# Patient Record
Sex: Female | Born: 1935 | Race: Black or African American | Hispanic: No | Smoking: Former smoker
Health system: Southern US, Community
[De-identification: ages and names within clinical notes are randomized; demographics above are authoritative.]

## PROBLEM LIST (undated history)

## (undated) DIAGNOSIS — F0280 Dementia in other diseases classified elsewhere without behavioral disturbance: Secondary | ICD-10-CM

## (undated) DIAGNOSIS — I129 Hypertensive chronic kidney disease with stage 1 through stage 4 chronic kidney disease, or unspecified chronic kidney disease: Secondary | ICD-10-CM

## (undated) DIAGNOSIS — F329 Major depressive disorder, single episode, unspecified: Secondary | ICD-10-CM

## (undated) DIAGNOSIS — I2510 Atherosclerotic heart disease of native coronary artery without angina pectoris: Secondary | ICD-10-CM

## (undated) DIAGNOSIS — F32A Depression, unspecified: Secondary | ICD-10-CM

## (undated) DIAGNOSIS — I639 Cerebral infarction, unspecified: Secondary | ICD-10-CM

## (undated) DIAGNOSIS — R0609 Other forms of dyspnea: Secondary | ICD-10-CM

## (undated) DIAGNOSIS — I10 Essential (primary) hypertension: Secondary | ICD-10-CM

## (undated) DIAGNOSIS — G309 Alzheimer's disease, unspecified: Secondary | ICD-10-CM

## (undated) DIAGNOSIS — I509 Heart failure, unspecified: Secondary | ICD-10-CM

## (undated) DIAGNOSIS — J449 Chronic obstructive pulmonary disease, unspecified: Secondary | ICD-10-CM

## (undated) DIAGNOSIS — J9621 Acute and chronic respiratory failure with hypoxia: Secondary | ICD-10-CM

## (undated) DIAGNOSIS — R0600 Dyspnea, unspecified: Secondary | ICD-10-CM

## (undated) DIAGNOSIS — J209 Acute bronchitis, unspecified: Secondary | ICD-10-CM

## (undated) DIAGNOSIS — J329 Chronic sinusitis, unspecified: Secondary | ICD-10-CM

## (undated) DIAGNOSIS — E43 Unspecified severe protein-calorie malnutrition: Secondary | ICD-10-CM

## (undated) DIAGNOSIS — Z9981 Dependence on supplemental oxygen: Secondary | ICD-10-CM

## (undated) DIAGNOSIS — E7800 Pure hypercholesterolemia, unspecified: Secondary | ICD-10-CM

## (undated) DIAGNOSIS — R519 Headache, unspecified: Secondary | ICD-10-CM

## (undated) DIAGNOSIS — E119 Type 2 diabetes mellitus without complications: Secondary | ICD-10-CM

## (undated) DIAGNOSIS — E785 Hyperlipidemia, unspecified: Secondary | ICD-10-CM

## (undated) DIAGNOSIS — J441 Chronic obstructive pulmonary disease with (acute) exacerbation: Secondary | ICD-10-CM

## (undated) DIAGNOSIS — I519 Heart disease, unspecified: Secondary | ICD-10-CM

## (undated) DIAGNOSIS — E1169 Type 2 diabetes mellitus with other specified complication: Secondary | ICD-10-CM

## (undated) DIAGNOSIS — R51 Headache: Secondary | ICD-10-CM

## (undated) HISTORY — DX: Acute bronchitis, unspecified: J20.9

## (undated) HISTORY — DX: Chronic obstructive pulmonary disease with (acute) exacerbation: J44.1

## (undated) HISTORY — PX: OTHER SURGICAL HISTORY: SHX169

## (undated) HISTORY — DX: Heart disease, unspecified: I51.9

## (undated) HISTORY — DX: Dyspnea, unspecified: R06.00

## (undated) HISTORY — DX: Hyperlipidemia, unspecified: E11.69

## (undated) HISTORY — DX: Heart failure, unspecified: I50.9

## (undated) HISTORY — DX: Hyperlipidemia, unspecified: E78.5

## (undated) HISTORY — DX: Essential (primary) hypertension: I10

## (undated) HISTORY — DX: Alzheimer's disease, unspecified: G30.9

## (undated) HISTORY — DX: Type 2 diabetes mellitus without complications: E11.9

## (undated) HISTORY — PX: ESOPHAGOGASTRODUODENOSCOPY: SHX1529

## (undated) HISTORY — PX: TONSILLECTOMY: SUR1361

## (undated) HISTORY — DX: Pure hypercholesterolemia, unspecified: E78.00

## (undated) HISTORY — DX: Unspecified severe protein-calorie malnutrition: E43

## (undated) HISTORY — DX: Other forms of dyspnea: R06.09

## (undated) HISTORY — DX: Dementia in other diseases classified elsewhere without behavioral disturbance: F02.80

## (undated) HISTORY — DX: Atherosclerotic heart disease of native coronary artery without angina pectoris: I25.10

## (undated) HISTORY — DX: Hypertensive chronic kidney disease with stage 1 through stage 4 chronic kidney disease, or unspecified chronic kidney disease: I12.9

## (undated) HISTORY — DX: Acute and chronic respiratory failure with hypoxia: J96.21

## (undated) HISTORY — PX: NASAL SINUS SURGERY: SHX719

---

## 1995-04-26 DIAGNOSIS — I639 Cerebral infarction, unspecified: Secondary | ICD-10-CM

## 1995-04-26 HISTORY — DX: Cerebral infarction, unspecified: I63.9

## 1997-09-01 ENCOUNTER — Other Ambulatory Visit: Admission: RE | Admit: 1997-09-01 | Discharge: 1997-09-01 | Payer: Self-pay | Admitting: *Deleted

## 1997-09-26 ENCOUNTER — Other Ambulatory Visit: Admission: RE | Admit: 1997-09-26 | Discharge: 1997-09-26 | Payer: Self-pay | Admitting: Internal Medicine

## 1997-10-01 ENCOUNTER — Ambulatory Visit: Admission: RE | Admit: 1997-10-01 | Discharge: 1997-10-01 | Payer: Self-pay | Admitting: *Deleted

## 1997-10-23 ENCOUNTER — Other Ambulatory Visit: Admission: RE | Admit: 1997-10-23 | Discharge: 1997-10-23 | Payer: Self-pay | Admitting: *Deleted

## 1997-10-31 ENCOUNTER — Other Ambulatory Visit: Admission: RE | Admit: 1997-10-31 | Discharge: 1997-10-31 | Payer: Self-pay | Admitting: Internal Medicine

## 1997-11-11 ENCOUNTER — Other Ambulatory Visit: Admission: RE | Admit: 1997-11-11 | Discharge: 1997-11-11 | Payer: Self-pay | Admitting: *Deleted

## 1998-04-15 ENCOUNTER — Ambulatory Visit: Admission: RE | Admit: 1998-04-15 | Discharge: 1998-04-15 | Payer: Self-pay | Admitting: *Deleted

## 1998-11-02 ENCOUNTER — Encounter: Admission: RE | Admit: 1998-11-02 | Discharge: 1998-11-23 | Payer: Self-pay | Admitting: *Deleted

## 1999-05-13 ENCOUNTER — Encounter: Payer: Self-pay | Admitting: *Deleted

## 1999-05-13 ENCOUNTER — Encounter: Admission: RE | Admit: 1999-05-13 | Discharge: 1999-05-13 | Payer: Self-pay | Admitting: *Deleted

## 1999-09-14 ENCOUNTER — Encounter: Admission: RE | Admit: 1999-09-14 | Discharge: 1999-09-14 | Payer: Self-pay | Admitting: Internal Medicine

## 1999-09-28 ENCOUNTER — Encounter: Admission: RE | Admit: 1999-09-28 | Discharge: 1999-09-28 | Payer: Self-pay | Admitting: Internal Medicine

## 1999-10-14 ENCOUNTER — Inpatient Hospital Stay (HOSPITAL_COMMUNITY): Admission: EM | Admit: 1999-10-14 | Discharge: 1999-10-19 | Payer: Self-pay | Admitting: Emergency Medicine

## 1999-10-14 ENCOUNTER — Encounter: Payer: Self-pay | Admitting: *Deleted

## 2000-02-08 ENCOUNTER — Other Ambulatory Visit: Admission: RE | Admit: 2000-02-08 | Discharge: 2000-02-08 | Payer: Self-pay | Admitting: *Deleted

## 2000-03-08 ENCOUNTER — Encounter (INDEPENDENT_AMBULATORY_CARE_PROVIDER_SITE_OTHER): Payer: Self-pay

## 2000-03-08 ENCOUNTER — Other Ambulatory Visit: Admission: RE | Admit: 2000-03-08 | Discharge: 2000-03-08 | Payer: Self-pay | Admitting: *Deleted

## 2000-05-31 ENCOUNTER — Encounter: Admission: RE | Admit: 2000-05-31 | Discharge: 2000-05-31 | Payer: Self-pay | Admitting: *Deleted

## 2000-05-31 ENCOUNTER — Encounter: Payer: Self-pay | Admitting: *Deleted

## 2001-01-30 ENCOUNTER — Other Ambulatory Visit: Admission: RE | Admit: 2001-01-30 | Discharge: 2001-01-30 | Payer: Self-pay | Admitting: *Deleted

## 2001-06-26 ENCOUNTER — Encounter: Admission: RE | Admit: 2001-06-26 | Discharge: 2001-06-26 | Payer: Self-pay | Admitting: Internal Medicine

## 2001-06-26 ENCOUNTER — Encounter: Payer: Self-pay | Admitting: Internal Medicine

## 2001-07-31 ENCOUNTER — Other Ambulatory Visit: Admission: RE | Admit: 2001-07-31 | Discharge: 2001-07-31 | Payer: Self-pay | Admitting: *Deleted

## 2002-04-10 ENCOUNTER — Encounter: Admission: RE | Admit: 2002-04-10 | Discharge: 2002-04-10 | Payer: Self-pay | Admitting: Internal Medicine

## 2002-04-10 ENCOUNTER — Encounter: Payer: Self-pay | Admitting: Internal Medicine

## 2002-05-10 ENCOUNTER — Encounter: Payer: Self-pay | Admitting: Internal Medicine

## 2002-05-10 ENCOUNTER — Encounter: Admission: RE | Admit: 2002-05-10 | Discharge: 2002-05-10 | Payer: Self-pay | Admitting: Internal Medicine

## 2002-07-18 ENCOUNTER — Encounter: Admission: RE | Admit: 2002-07-18 | Discharge: 2002-07-18 | Payer: Self-pay | Admitting: Internal Medicine

## 2002-07-18 ENCOUNTER — Encounter: Payer: Self-pay | Admitting: Internal Medicine

## 2003-01-07 ENCOUNTER — Emergency Department (HOSPITAL_COMMUNITY): Admission: EM | Admit: 2003-01-07 | Discharge: 2003-01-07 | Payer: Self-pay | Admitting: Emergency Medicine

## 2003-09-18 ENCOUNTER — Other Ambulatory Visit: Admission: RE | Admit: 2003-09-18 | Discharge: 2003-09-18 | Payer: Self-pay | Admitting: *Deleted

## 2003-09-18 ENCOUNTER — Ambulatory Visit (HOSPITAL_COMMUNITY): Admission: RE | Admit: 2003-09-18 | Discharge: 2003-09-18 | Payer: Self-pay | Admitting: Internal Medicine

## 2004-03-12 ENCOUNTER — Ambulatory Visit: Payer: Self-pay | Admitting: Cardiovascular Disease

## 2004-05-19 ENCOUNTER — Ambulatory Visit: Payer: Self-pay

## 2004-05-19 ENCOUNTER — Ambulatory Visit: Payer: Self-pay | Admitting: Cardiovascular Disease

## 2004-05-21 ENCOUNTER — Ambulatory Visit: Payer: Self-pay | Admitting: Cardiovascular Disease

## 2004-05-24 ENCOUNTER — Encounter: Admission: RE | Admit: 2004-05-24 | Discharge: 2004-05-24 | Payer: Self-pay | Admitting: Cardiovascular Disease

## 2004-07-09 ENCOUNTER — Ambulatory Visit: Admission: RE | Admit: 2004-07-09 | Discharge: 2004-07-09 | Payer: Self-pay | Admitting: Vascular Surgery

## 2004-07-20 ENCOUNTER — Ambulatory Visit (HOSPITAL_COMMUNITY): Admission: RE | Admit: 2004-07-20 | Discharge: 2004-07-20 | Payer: Self-pay | Admitting: Vascular Surgery

## 2004-09-28 ENCOUNTER — Ambulatory Visit (HOSPITAL_COMMUNITY): Admission: RE | Admit: 2004-09-28 | Discharge: 2004-09-28 | Payer: Self-pay | Admitting: Internal Medicine

## 2005-03-25 ENCOUNTER — Ambulatory Visit: Payer: Self-pay | Admitting: Cardiovascular Disease

## 2005-09-23 ENCOUNTER — Ambulatory Visit: Payer: Self-pay | Admitting: Cardiovascular Disease

## 2005-09-29 ENCOUNTER — Ambulatory Visit (HOSPITAL_COMMUNITY): Admission: RE | Admit: 2005-09-29 | Discharge: 2005-09-29 | Payer: Self-pay | Admitting: Internal Medicine

## 2006-04-04 ENCOUNTER — Ambulatory Visit: Payer: Self-pay | Admitting: Cardiovascular Disease

## 2006-10-03 ENCOUNTER — Ambulatory Visit: Payer: Self-pay | Admitting: Cardiovascular Disease

## 2006-10-05 ENCOUNTER — Ambulatory Visit: Payer: Self-pay | Admitting: Cardiovascular Disease

## 2006-10-05 ENCOUNTER — Ambulatory Visit (HOSPITAL_COMMUNITY): Admission: RE | Admit: 2006-10-05 | Discharge: 2006-10-05 | Payer: Self-pay | Admitting: Cardiovascular Disease

## 2006-10-10 ENCOUNTER — Ambulatory Visit (HOSPITAL_COMMUNITY): Admission: RE | Admit: 2006-10-10 | Discharge: 2006-10-10 | Payer: Self-pay | Admitting: Internal Medicine

## 2007-03-26 ENCOUNTER — Ambulatory Visit: Payer: Self-pay | Admitting: Cardiovascular Disease

## 2007-05-23 ENCOUNTER — Ambulatory Visit: Payer: Self-pay | Admitting: Vascular Surgery

## 2007-09-24 ENCOUNTER — Ambulatory Visit: Payer: Self-pay | Admitting: Cardiovascular Disease

## 2007-09-24 LAB — CONVERTED CEMR LAB: Rhuematoid fact SerPl-aCnc: <20 intl units/mL — ABNORMAL LOW (ref 0.0–20.0)

## 2007-10-12 ENCOUNTER — Ambulatory Visit (HOSPITAL_COMMUNITY): Admission: RE | Admit: 2007-10-12 | Discharge: 2007-10-12 | Payer: Self-pay | Admitting: Internal Medicine

## 2008-04-15 ENCOUNTER — Ambulatory Visit: Payer: Self-pay | Admitting: Cardiovascular Disease

## 2008-04-15 LAB — CONVERTED CEMR LAB
CO2: 32 meq/L (ref 19–32)
Chloride: 102 meq/L (ref 96–112)
Creatinine, Ser: 0.9 mg/dL (ref 0.4–1.2)

## 2008-05-07 ENCOUNTER — Ambulatory Visit: Payer: Self-pay | Admitting: Vascular Surgery

## 2008-05-14 ENCOUNTER — Ambulatory Visit: Payer: Self-pay | Admitting: Cardiovascular Disease

## 2008-05-14 ENCOUNTER — Ambulatory Visit (HOSPITAL_COMMUNITY): Admission: RE | Admit: 2008-05-14 | Discharge: 2008-05-14 | Payer: Self-pay | Admitting: Cardiovascular Disease

## 2008-09-08 ENCOUNTER — Telehealth: Payer: Self-pay | Admitting: Cardiovascular Disease

## 2008-09-12 DIAGNOSIS — I10 Essential (primary) hypertension: Secondary | ICD-10-CM | POA: Insufficient documentation

## 2008-09-15 ENCOUNTER — Ambulatory Visit: Payer: Self-pay | Admitting: Cardiovascular Disease

## 2008-10-14 ENCOUNTER — Ambulatory Visit: Payer: Self-pay | Admitting: Cardiovascular Disease

## 2008-11-04 ENCOUNTER — Ambulatory Visit (HOSPITAL_COMMUNITY): Admission: RE | Admit: 2008-11-04 | Discharge: 2008-11-04 | Payer: Self-pay | Admitting: Internal Medicine

## 2009-03-25 ENCOUNTER — Ambulatory Visit: Payer: Self-pay | Admitting: Vascular Surgery

## 2009-03-31 ENCOUNTER — Ambulatory Visit: Payer: Self-pay | Admitting: Cardiovascular Disease

## 2009-04-23 ENCOUNTER — Encounter: Payer: Self-pay | Admitting: Cardiovascular Disease

## 2009-10-28 ENCOUNTER — Ambulatory Visit: Payer: Self-pay | Admitting: Vascular Surgery

## 2009-11-11 ENCOUNTER — Ambulatory Visit (HOSPITAL_COMMUNITY): Admission: RE | Admit: 2009-11-11 | Discharge: 2009-11-11 | Payer: Self-pay | Admitting: Internal Medicine

## 2009-11-18 ENCOUNTER — Ambulatory Visit: Payer: Self-pay | Admitting: Cardiovascular Disease

## 2010-04-30 ENCOUNTER — Inpatient Hospital Stay (HOSPITAL_COMMUNITY)
Admission: EM | Admit: 2010-04-30 | Discharge: 2010-05-03 | Payer: Self-pay | Source: Home / Self Care | Attending: Internal Medicine | Admitting: Internal Medicine

## 2010-05-01 ENCOUNTER — Encounter (INDEPENDENT_AMBULATORY_CARE_PROVIDER_SITE_OTHER): Payer: Self-pay | Admitting: Internal Medicine

## 2010-05-06 ENCOUNTER — Ambulatory Visit: Admit: 2010-05-06 | Payer: Self-pay | Admitting: Vascular Surgery

## 2010-05-10 LAB — DIFFERENTIAL
Basophils Absolute: 0.1 10*3/uL (ref 0.0–0.1)
Basophils Absolute: 0 10*3/uL (ref 0.0–0.1)
Basophils Relative: 1 % (ref 0–1)
Basophils Relative: 1 % (ref 0–1)
Eosinophils Absolute: 0.4 10*3/uL (ref 0.0–0.7)
Eosinophils Absolute: 0.3 10*3/uL (ref 0.0–0.7)
Eosinophils Relative: 4 % (ref 0–5)
Eosinophils Relative: 4 % (ref 0–5)
Lymphocytes Relative: 43 % (ref 12–46)
Lymphocytes Relative: 37 % (ref 12–46)
Lymphs Abs: 4.4 10*3/uL — ABNORMAL HIGH (ref 0.7–4.0)
Lymphs Abs: 2.9 10*3/uL (ref 0.7–4.0)
Monocytes Absolute: 0.9 10*3/uL (ref 0.1–1.0)
Monocytes Absolute: 0.6 10*3/uL (ref 0.1–1.0)
Monocytes Relative: 9 % (ref 3–12)
Monocytes Relative: 8 % (ref 3–12)
Neutro Abs: 4.5 10*3/uL (ref 1.7–7.7)
Neutro Abs: 3.9 10*3/uL (ref 1.7–7.7)
Neutrophils Relative %: 44 % (ref 43–77)
Neutrophils Relative %: 50 % (ref 43–77)

## 2010-05-10 LAB — HEMOGLOBIN A1C
Hgb A1c MFr Bld: 8.1 % — ABNORMAL HIGH (ref <5.7)
Mean Plasma Glucose: 186 mg/dL — ABNORMAL HIGH (ref <117)

## 2010-05-10 LAB — GLUCOSE, CAPILLARY
Glucose-Capillary: 162 mg/dL — ABNORMAL HIGH (ref 70–99)
Glucose-Capillary: 164 mg/dL — ABNORMAL HIGH (ref 70–99)
Glucose-Capillary: 165 mg/dL — ABNORMAL HIGH (ref 70–99)
Glucose-Capillary: 179 mg/dL — ABNORMAL HIGH (ref 70–99)
Glucose-Capillary: 189 mg/dL — ABNORMAL HIGH (ref 70–99)
Glucose-Capillary: 76 mg/dL (ref 70–99)
Glucose-Capillary: 134 mg/dL — ABNORMAL HIGH (ref 70–99)
Glucose-Capillary: 146 mg/dL — ABNORMAL HIGH (ref 70–99)
Glucose-Capillary: 166 mg/dL — ABNORMAL HIGH (ref 70–99)
Glucose-Capillary: 193 mg/dL — ABNORMAL HIGH (ref 70–99)
Glucose-Capillary: 236 mg/dL — ABNORMAL HIGH (ref 70–99)
Glucose-Capillary: 92 mg/dL (ref 70–99)
Glucose-Capillary: 119 mg/dL — ABNORMAL HIGH (ref 70–99)
Glucose-Capillary: 121 mg/dL — ABNORMAL HIGH (ref 70–99)
Glucose-Capillary: 127 mg/dL — ABNORMAL HIGH (ref 70–99)
Glucose-Capillary: 270 mg/dL — ABNORMAL HIGH (ref 70–99)

## 2010-05-10 LAB — CBC
HCT: 43.5 % (ref 36.0–46.0)
HCT: 41.5 % (ref 36.0–46.0)
HCT: 37.3 % (ref 36.0–46.0)
Hemoglobin: 14.3 g/dL (ref 12.0–15.0)
Hemoglobin: 13.3 g/dL (ref 12.0–15.0)
Hemoglobin: 12.1 g/dL (ref 12.0–15.0)
MCH: 31.6 pg (ref 26.0–34.0)
MCH: 31.3 pg (ref 26.0–34.0)
MCH: 31.8 pg (ref 26.0–34.0)
MCHC: 32.9 g/dL (ref 30.0–36.0)
MCHC: 32 g/dL (ref 30.0–36.0)
MCHC: 32.4 g/dL (ref 30.0–36.0)
MCV: 96.2 fL (ref 78.0–100.0)
MCV: 97.6 fL (ref 78.0–100.0)
MCV: 97.9 fL (ref 78.0–100.0)
Platelets: 171 10*3/uL (ref 150–400)
Platelets: 182 10*3/uL (ref 150–400)
Platelets: 152 10*3/uL (ref 150–400)
RBC: 4.52 MIL/uL (ref 3.87–5.11)
RBC: 4.25 MIL/uL (ref 3.87–5.11)
RBC: 3.81 MIL/uL — ABNORMAL LOW (ref 3.87–5.11)
RDW: 13.5 % (ref 11.5–15.5)
RDW: 13.7 % (ref 11.5–15.5)
RDW: 13.6 % (ref 11.5–15.5)
WBC: 10.2 10*3/uL (ref 4.0–10.5)
WBC: 7.8 10*3/uL (ref 4.0–10.5)
WBC: 7.7 10*3/uL (ref 4.0–10.5)

## 2010-05-10 LAB — COMPREHENSIVE METABOLIC PANEL
ALT: 19 U/L (ref 0–35)
AST: 20 U/L (ref 0–37)
Albumin: 3.8 g/dL (ref 3.5–5.2)
Alkaline Phosphatase: 42 U/L (ref 39–117)
BUN: 12 mg/dL (ref 6–23)
CO2: 27 mEq/L (ref 19–32)
Calcium: 8.5 mg/dL (ref 8.4–10.5)
Chloride: 102 mEq/L (ref 96–112)
Creatinine, Ser: 0.88 mg/dL (ref 0.4–1.2)
GFR calc Af Amer: >60 mL/min (ref >60)
GFR calc non Af Amer: >60 mL/min (ref >60)
Glucose, Bld: 56 mg/dL — ABNORMAL LOW (ref 70–99)
Potassium: 3.8 mEq/L (ref 3.5–5.1)
Sodium: 136 mEq/L (ref 135–145)
Total Bilirubin: 0.5 mg/dL (ref 0.3–1.2)
Total Protein: 6.8 g/dL (ref 6.0–8.3)

## 2010-05-10 LAB — POCT CARDIAC MARKERS
CKMB, poc: 2.2 ng/mL (ref 1.0–8.0)
Myoglobin, poc: 51.9 ng/mL (ref 12–200)
Troponin i, poc: <0.05 ng/mL (ref 0.00–0.09)

## 2010-05-10 LAB — BASIC METABOLIC PANEL
BUN: 10 mg/dL (ref 6–23)
BUN: 11 mg/dL (ref 6–23)
BUN: 15 mg/dL (ref 6–23)
CO2: 28 mEq/L (ref 19–32)
CO2: 28 mEq/L (ref 19–32)
CO2: 30 mEq/L (ref 19–32)
Calcium: 9.4 mg/dL (ref 8.4–10.5)
Calcium: 8.6 mg/dL (ref 8.4–10.5)
Calcium: 8.9 mg/dL (ref 8.4–10.5)
Chloride: 101 mEq/L (ref 96–112)
Chloride: 104 mEq/L (ref 96–112)
Chloride: 103 mEq/L (ref 96–112)
Creatinine, Ser: 0.95 mg/dL (ref 0.4–1.2)
Creatinine, Ser: 0.8 mg/dL (ref 0.4–1.2)
Creatinine, Ser: 0.74 mg/dL (ref 0.4–1.2)
GFR calc Af Amer: >60 mL/min (ref >60)
GFR calc Af Amer: >60 mL/min (ref >60)
GFR calc Af Amer: >60 mL/min (ref >60)
GFR calc non Af Amer: 57 mL/min — ABNORMAL LOW (ref >60)
GFR calc non Af Amer: >60 mL/min (ref >60)
GFR calc non Af Amer: >60 mL/min (ref >60)
Glucose, Bld: 118 mg/dL — ABNORMAL HIGH (ref 70–99)
Glucose, Bld: 97 mg/dL (ref 70–99)
Glucose, Bld: 120 mg/dL — ABNORMAL HIGH (ref 70–99)
Potassium: 4 mEq/L (ref 3.5–5.1)
Potassium: 3.7 mEq/L (ref 3.5–5.1)
Potassium: 4.1 mEq/L (ref 3.5–5.1)
Sodium: 138 mEq/L (ref 135–145)
Sodium: 138 mEq/L (ref 135–145)
Sodium: 138 mEq/L (ref 135–145)

## 2010-05-10 LAB — PROTIME-INR
INR: 2.67 — ABNORMAL HIGH (ref 0.00–1.49)
INR: 2.52 — ABNORMAL HIGH (ref 0.00–1.49)
INR: 3.18 — ABNORMAL HIGH (ref 0.00–1.49)
INR: 2.49 — ABNORMAL HIGH (ref 0.00–1.49)
Prothrombin Time: 28.5 seconds — ABNORMAL HIGH (ref 11.6–15.2)
Prothrombin Time: 27.3 seconds — ABNORMAL HIGH (ref 11.6–15.2)
Prothrombin Time: 32.6 seconds — ABNORMAL HIGH (ref 11.6–15.2)
Prothrombin Time: 27 seconds — ABNORMAL HIGH (ref 11.6–15.2)

## 2010-05-10 LAB — TSH: TSH: 0.849 u[IU]/mL (ref 0.350–4.500)

## 2010-05-10 LAB — LIPID PANEL
Cholesterol: 171 mg/dL (ref 0–200)
HDL: 53 mg/dL (ref >39)
LDL Cholesterol: 73 mg/dL (ref 0–99)
Total CHOL/HDL Ratio: 3.2 RATIO
Triglycerides: 226 mg/dL — ABNORMAL HIGH (ref <150)
VLDL: 45 mg/dL — ABNORMAL HIGH (ref 0–40)

## 2010-05-10 LAB — URINALYSIS, ROUTINE W REFLEX MICROSCOPIC
Bilirubin Urine: NEGATIVE
Hgb urine dipstick: NEGATIVE
Ketones, ur: NEGATIVE mg/dL
Nitrite: POSITIVE — AB
Protein, ur: NEGATIVE mg/dL
Specific Gravity, Urine: 1.012 (ref 1.005–1.030)
Urine Glucose, Fasting: NEGATIVE mg/dL
Urobilinogen, UA: 1 mg/dL (ref 0.0–1.0)
pH: 6.5 (ref 5.0–8.0)

## 2010-05-10 LAB — CK TOTAL AND CKMB (NOT AT ARMC)
CK, MB: 3.4 ng/mL (ref 0.3–4.0)
Relative Index: 2.2 (ref 0.0–2.5)
Total CK: 157 U/L (ref 7–177)

## 2010-05-10 LAB — CARDIAC PANEL(CRET KIN+CKTOT+MB+TROPI)
CK, MB: 1.9 ng/mL (ref 0.3–4.0)
CK, MB: 2.4 ng/mL (ref 0.3–4.0)
Relative Index: 1.6 (ref 0.0–2.5)
Relative Index: 1.8 (ref 0.0–2.5)
Total CK: 117 U/L (ref 7–177)
Total CK: 130 U/L (ref 7–177)
Troponin I: 0.02 ng/mL (ref 0.00–0.06)
Troponin I: 0.01 ng/mL (ref 0.00–0.06)

## 2010-05-10 LAB — PHOSPHORUS: Phosphorus: 3.9 mg/dL (ref 2.3–4.6)

## 2010-05-10 LAB — URINE MICROSCOPIC-ADD ON

## 2010-05-10 LAB — URINE CULTURE
Colony Count: >=100000
Culture  Setup Time: 201201071723

## 2010-05-10 LAB — BRAIN NATRIURETIC PEPTIDE: Pro B Natriuretic peptide (BNP): 49 pg/mL (ref 0.0–100.0)

## 2010-05-10 LAB — DIGOXIN LEVEL: Digoxin Level: 0.7 ng/mL — ABNORMAL LOW (ref 0.8–2.0)

## 2010-05-10 LAB — TROPONIN I: Troponin I: 0.03 ng/mL (ref 0.00–0.06)

## 2010-05-10 LAB — T4, FREE: Free T4: 1.17 ng/dL (ref 0.80–1.80)

## 2010-05-10 LAB — APTT: aPTT: 48 seconds — ABNORMAL HIGH (ref 24–37)

## 2010-05-10 LAB — MAGNESIUM: Magnesium: 2.2 mg/dL (ref 1.5–2.5)

## 2010-05-13 ENCOUNTER — Ambulatory Visit
Admission: RE | Admit: 2010-05-13 | Discharge: 2010-05-13 | Payer: Self-pay | Source: Home / Self Care | Attending: Cardiovascular Disease | Admitting: Cardiovascular Disease

## 2010-05-13 ENCOUNTER — Other Ambulatory Visit: Payer: Self-pay | Admitting: Cardiovascular Disease

## 2010-05-13 LAB — URINALYSIS
Bilirubin Urine: NEGATIVE
Hemoglobin, Urine: NEGATIVE
Ketones, ur: NEGATIVE
Leukocytes, UA: NEGATIVE
Nitrite: NEGATIVE
Specific Gravity, Urine: 1.02 (ref 1.000–1.030)
Total Protein, Urine: NEGATIVE
Urine Glucose: NEGATIVE
Urobilinogen, UA: 0.2 (ref 0.0–1.0)
pH: 5.5 (ref 5.0–8.0)

## 2010-05-15 ENCOUNTER — Encounter: Payer: Self-pay | Admitting: Internal Medicine

## 2010-05-16 ENCOUNTER — Encounter: Payer: Self-pay | Admitting: Internal Medicine

## 2010-05-25 NOTE — Assessment & Plan Note (Signed)
Summary: F6M/DM   Primary Provider:  Dr Velna Hatchet   History of Present Illness: [name] is seen today for F/U of PAF, right carotid bruit, HTN and elevated lipids.  She is doing well except for her sinuses.  She has had two previous surgeries and continues to complain about chronic infectiosn.  She has just seen Dr. Selmer Dominion who is considering doing surgery.  From a cardiac standpoint that would be fine.  She could hold her coumadin for 4-5 days without lovenox overlap.  She has a history of DCM with no CAD EF 45% by MRI in 04/2008.  She denies SSCP, palpitations or dyspnea.  She has had no edema and continues to be very thin.  she has been compliant with her meds. Her INR's have been Rx with no bleeding diathesis  She just had a duplex at Dr fields office and her RICA is stable with 60-79% stenosis.    Current Problems (verified): 1)  Palpitations, Occasional  (ICD-785.1) 2)  Carotid Artery Disease  (ICD-433.10) 3)  Congestive Heart Failure, Hx of  (ICD-V12.50) 4)  Cardiomyopathy  (ICD-425.4) 5)  Hypertension  (ICD-401.9) 6)  Hypercholesterolemia  (ICD-272.0) 7)  Restless Leg Syndrome, Hx of  (ICD-V12.49) 8)  Dyspnea On Exertion  (ICD-786.09) 9)  Diabetes Mellitus, Type II  (ICD-250.00)  Current Medications (verified): 1)  Digoxin 0.125 Mg Tabs (Digoxin) .... Take One Tablet By Mouth Daily 2)  Coreg 3.125 Mg Tabs (Carvedilol) .Marland Kitchen.. 1 Tab Two Times A Day 3)  Furosemide 20 Mg Tabs (Furosemide) .... Take One Tablet By Mouth Daily. 4)  Benazepril Hcl 40 Mg Tabs (Benazepril Hcl) .Marland Kitchen.. 1 Tab By Mouth Once Daily 5)  Glimepiride 2 Mg Tabs (Glimepiride) .Marland Kitchen.. 1 Tab By Mouth Once Daily 6)  Coumadin 4 Mg Tabs (Warfarin Sodium) .... As Directed 7)  Multivitamins   Tabs (Multiple Vitamin) .Marland Kitchen.. 1 Tab By Mouth Once Daily 8)  Lipitor 40 Mg Tabs (Atorvastatin Calcium) .Marland Kitchen.. 1 Tab Mon, Wed. Fri  Allergies (verified): No Known Drug Allergies  Past History:  Past Medical History: Last updated:  09/12/2008 CARDIOMYOPATHY (ICD-425.4)- Nonischemic 04/2008 EF 45% cardiac MRI no scar HYPERTENSION (ICD-401.9) HYPERCHOLESTEROLEMIA (ICD-272.0) CAROTID ARTERY DISEASE (ICD-433.10) CONGESTIVE HEART FAILURE, HX OF (ICD-V12.50) Atrial Fib RESTLESS LEG SYNDROME, HX OF (ICD-V12.49) DYSPNEA ON EXERTION (ICD-786.09)- Chronic DIABETES MELLITUS, TYPE II (ICD-250.00)  Past Surgical History: Last updated: 09/12/2008  Carotid angiogram- 2006  Hysterectomy- partial Gallbladder Esophagogastroduodenoscopy with foreign body removal.  sinus surgery  Family History: Last updated: 09/12/2008  Coronary artery disease.  Unknown neck cancer in her mother.  Social History: Last updated: 09/15/2008 Married - 1 child No alcohol use  No drug use Tobacco Use - Yes.   Review of Systems       Denies fever, malais, weight loss, blurry vision, decreased visual acuity, cough, sputum, SOB, hemoptysis, pleuritic pain, palpitaitons, heartburn, abdominal pain, melena, lower extremity edema, claudication, or rash.   Vital Signs:  Patient profile:   77 year old female Height:      64 inches Weight:      92 pounds BMI:     15.85 Pulse rate:   84 / minute Resp:     12 per minute BP sitting:   134 / 85  (left arm)  Vitals Entered By: Kem Parkinson (November 18, 2009 2:59 PM)  Physical Exam  General:  Affect appropriate Healthy:  appears stated age HEENT: normal Neck supple with no adenopathy JVP normal right  bruits no thyromegaly Lungs clear with  no wheezing and good diaphragmatic motion Heart:  S1/S2 no murmur,rub, gallop or click PMI normal Abdomen: benighn, BS positve, no tenderness, no AAA no bruit.  No HSM or HJR Distal pulses intact with no bruits No edema Neuro non-focal Skin warm and dry    Impression & Recommendations:  Problem # 1:  CAROTID ARTERY DISEASE (ICD-433.10) Stable 60-79% RICA stenosis.  F/U duplex CVTS next June Her updated medication list for this problem includes:     Coumadin 4 Mg Tabs (Warfarin sodium) .Marland Kitchen... As directed  Problem # 2:  CARDIOMYOPATHY (ICD-425.4) Stable euvolemic.  EF 45% by MRI 2010 Her updated medication list for this problem includes:    Digoxin 0.125 Mg Tabs (Digoxin) .Marland Kitchen... Take one tablet by mouth daily    Coreg 3.125 Mg Tabs (Carvedilol) .Marland Kitchen... 1 tab two times a day    Furosemide 20 Mg Tabs (Furosemide) .Marland Kitchen... Take one tablet by mouth daily.    Benazepril Hcl 40 Mg Tabs (Benazepril hcl) .Marland Kitchen... 1 tab by mouth once daily    Coumadin 4 Mg Tabs (Warfarin sodium) .Marland Kitchen... As directed  Problem # 3:  HYPERTENSION (ICD-401.9)  Her updated medication list for this problem includes:    Coreg 3.125 Mg Tabs (Carvedilol) .Marland Kitchen... 1 tab two times a day    Furosemide 20 Mg Tabs (Furosemide) .Marland Kitchen... Take one tablet by mouth daily.    Benazepril Hcl 40 Mg Tabs (Benazepril hcl) .Marland Kitchen... 1 tab by mouth once daily  Problem # 4:  HYPERTENSION (ICD-401.9) Well contorlled Her updated medication list for this problem includes:    Coreg 3.125 Mg Tabs (Carvedilol) .Marland Kitchen... 1 tab two times a day    Furosemide 20 Mg Tabs (Furosemide) .Marland Kitchen... Take one tablet by mouth daily.    Benazepril Hcl 40 Mg Tabs (Benazepril hcl) .Marland Kitchen... 1 tab by mouth once daily  Problem # 5:  PAROXYSMAL ATRIAL FIBRILLATION (ICD-427.31)  In NSR continue coumadin  Her updated medication list for this problem includes:    Digoxin 0.125 Mg Tabs (Digoxin) .Marland Kitchen... Take one tablet by mouth daily    Coreg 3.125 Mg Tabs (Carvedilol) .Marland Kitchen... 1 tab two times a day    Coumadin 4 Mg Tabs (Warfarin sodium) .Marland Kitchen... As directed  Orders: EKG w/ Interpretation (93000)  Patient Instructions: 1)  Your physician wants you to follow-up in:6 months   You will receive a reminder letter in the mail two months in advance. If you don't receive a letter, please call our office to schedule the follow-up appointment.   EKG Report  Procedure date:  11/18/2009  Findings:      NSR 84 Nonspecific ST/T wave changes Abnormal  ECG

## 2010-05-27 NOTE — Assessment & Plan Note (Signed)
Summary: 6 month rov/sl   Primary Provider:  Dr Velna Hatchet   History of Present Illness: [name] was recently hospitalzied for UTI, COPD exacerbation and diatolic CHF.  BP seems suboptimaly controlled despite 3 drug Rx.  Compliant with meds.  Still with frequency and dysuria and F/U UA off cipro not done.  Denies SSCP palpitations or edema.  Wheezing and SOB improved.  No cough sputum or fever.  Counseled for less than 10 minutes on smoking cessation.  She seems redicent to try despite recent hospitalization  Current Problems (verified): 1)  Dysuria  (ICD-788.1) 2)  Paroxysmal Atrial Fibrillation  (ICD-427.31) 3)  Palpitations, Occasional  (ICD-785.1) 4)  Carotid Artery Disease  (ICD-433.10) 5)  Congestive Heart Failure, Hx of  (ICD-V12.50) 6)  Cardiomyopathy  (ICD-425.4) 7)  Hypertension  (ICD-401.9) 8)  Hypercholesterolemia  (ICD-272.0) 9)  Restless Leg Syndrome, Hx of  (ICD-V12.49) 10)  Dyspnea On Exertion  (ICD-786.09) 11)  Diabetes Mellitus, Type II  (ICD-250.00)  Current Medications (verified): 1)  Digoxin 0.125 Mg Tabs (Digoxin) .... Take One Tablet By Mouth Daily 2)  Coreg 3.125 Mg Tabs (Carvedilol) .Marland Kitchen.. 1 Tab Two Times A Day 3)  Furosemide 20 Mg Tabs (Furosemide) .... Take One Tablet By Mouth Daily. 4)  Benazepril Hcl 40 Mg Tabs (Benazepril Hcl) .Marland Kitchen.. 1 Tab By Mouth Once Daily 5)  Glimepiride 2 Mg Tabs (Glimepiride) .Marland Kitchen.. 1 Tab By Mouth Once Daily 6)  Coumadin 4 Mg Tabs (Warfarin Sodium) .... As Directed 7)  Multivitamins   Tabs (Multiple Vitamin) .Marland Kitchen.. 1 Tab By Mouth Once Daily 8)  Lipitor 40 Mg Tabs (Atorvastatin Calcium) .Marland Kitchen.. 1 Tab Mon, Wed. Fri 9)  Amlodipine Besylate 5 Mg Tabs (Amlodipine Besylate) .... Take One Tablet By Mouth Daily  Allergies: No Known Drug Allergies  Past History:  Past Medical History: Last updated: 09/12/2008 CARDIOMYOPATHY (ICD-425.4)- Nonischemic 04/2008 EF 45% cardiac MRI no scar HYPERTENSION (ICD-401.9) HYPERCHOLESTEROLEMIA  (ICD-272.0) CAROTID ARTERY DISEASE (ICD-433.10) CONGESTIVE HEART FAILURE, HX OF (ICD-V12.50) Atrial Fib RESTLESS LEG SYNDROME, HX OF (ICD-V12.49) DYSPNEA ON EXERTION (ICD-786.09)- Chronic DIABETES MELLITUS, TYPE II (ICD-250.00)  Past Surgical History: Last updated: 09/12/2008  Carotid angiogram- 2006  Hysterectomy- partial Gallbladder Esophagogastroduodenoscopy with foreign body removal.  sinus surgery  Family History: Last updated: 09/12/2008  Coronary artery disease.  Unknown neck cancer in her mother.  Social History: Last updated: 09/15/2008 Married - 1 child No alcohol use  No drug use Tobacco Use - Yes.   Review of Systems       Denies fever, malais, weight loss, blurry vision, decreased visual acuity, cough, sputum,  hemoptysis, pleuritic pain, palpitaitons, heartburn, abdominal pain, melena, lower extremity edema, claudication, or rash.   Vital Signs:  Patient profile:   77 year old female Height:      64 inches Weight:      93 pounds BMI:     16.02 Pulse rate:   80 / minute BP sitting:   134 / 85  (left arm)  Vitals Entered By: Kem Parkinson (May 13, 2010 12:15 PM)  Physical Exam  General:  Affect appropriate Healthy:  appears stated age HEENT: normal Neck supple with no adenopathy JVP normal no bruits no thyromegaly Lungs clear with no wheezing and good diaphragmatic motion Heart:  S1/S2 no murmur,rub, gallop or click PMI normal Abdomen: benighn, BS positve, no tenderness, no AAA no bruit.  No HSM or HJR Distal pulses intact with no bruits No edema Neuro non-focal Skin warm and dry    Impression &  Recommendations:  Problem # 1:  DYSURIA (ICD-788.1) F/U UA given recent Rx of UTI Orders: TLB-Udip ONLY (81003-UDIP)  Problem # 2:  PAROXYSMAL ATRIAL FIBRILLATION (ICD-427.31) Continue medication maint NSR Her updated medication list for this problem includes:    Digoxin 0.125 Mg Tabs (Digoxin) .Marland Kitchen... Take one tablet by mouth daily     Coreg 3.125 Mg Tabs (Carvedilol) .Marland Kitchen... 1 tab two times a day    Coumadin 4 Mg Tabs (Warfarin sodium) .Marland Kitchen... As directed  Problem # 3:  CARDIOMYOPATHY (ICD-425.4) Stable euvolemic  recent respitory exacerbation  Her updated medication list for this problem includes:    Digoxin 0.125 Mg Tabs (Digoxin) .Marland Kitchen... Take one tablet by mouth daily    Coreg 3.125 Mg Tabs (Carvedilol) .Marland Kitchen... 1 tab two times a day    Furosemide 20 Mg Tabs (Furosemide) .Marland Kitchen... Take one tablet by mouth daily.    Benazepril Hcl 40 Mg Tabs (Benazepril hcl) .Marland Kitchen... 1 tab by mouth once daily    Coumadin 4 Mg Tabs (Warfarin sodium) .Marland Kitchen... As directed    Amlodipine Besylate 5 Mg Tabs (Amlodipine besylate) .Marland Kitchen... Take one tablet by mouth daily  Problem # 4:  CAROTID ARTERY DISEASE (ICD-433.10) Stable F/U duplex in 6 months Her updated medication list for this problem includes:    Coumadin 4 Mg Tabs (Warfarin sodium) .Marland Kitchen... As directed  Problem # 5:  HYPERTENSION (ICD-401.9) Labile  add amlodipine  F/U with low sodium diet Her updated medication list for this problem includes:    Coreg 3.125 Mg Tabs (Carvedilol) .Marland Kitchen... 1 tab two times a day    Furosemide 20 Mg Tabs (Furosemide) .Marland Kitchen... Take one tablet by mouth daily.    Benazepril Hcl 40 Mg Tabs (Benazepril hcl) .Marland Kitchen... 1 tab by mouth once daily    Amlodipine Besylate 5 Mg Tabs (Amlodipine besylate) .Marland Kitchen... Take one tablet by mouth daily  Problem # 6:  DYSPNEA ON EXERTION (ICD-786.09) COPD  Discussed smoking cessation  Not very motivated to quit Her updated medication list for this problem includes:    Digoxin 0.125 Mg Tabs (Digoxin) .Marland Kitchen... Take one tablet by mouth daily    Coreg 3.125 Mg Tabs (Carvedilol) .Marland Kitchen... 1 tab two times a day    Furosemide 20 Mg Tabs (Furosemide) .Marland Kitchen... Take one tablet by mouth daily.    Benazepril Hcl 40 Mg Tabs (Benazepril hcl) .Marland Kitchen... 1 tab by mouth once daily    Amlodipine Besylate 5 Mg Tabs (Amlodipine besylate) .Marland Kitchen... Take one tablet by mouth daily  Patient  Instructions: 1)  Your physician recommends that you schedule a follow-up appointment in: 8 WEEKS 2)  Your physician has recommended you make the following change in your medication: START AMLODIPINE 5MG  ONCE DAILY Prescriptions: AMLODIPINE BESYLATE 5 MG TABS (AMLODIPINE BESYLATE) Take one tablet by mouth daily  #30 x 12   Entered by:   Deliah Goody, RN   Authorized by:   Colon Branch, MD, [facility]   Signed by:   Deliah Goody, RN on 05/13/2010   Method used:   Electronically to        [facility] [id number] (retail)       [address] [place], [place]  [address]       Ph: [phone]       Fax: [phone]   RxID:   [id number]

## 2010-07-09 ENCOUNTER — Encounter: Payer: Self-pay | Admitting: Cardiovascular Disease

## 2010-07-23 ENCOUNTER — Ambulatory Visit: Payer: Self-pay | Admitting: Cardiovascular Disease

## 2010-08-06 ENCOUNTER — Other Ambulatory Visit: Payer: Self-pay | Admitting: Cardiovascular Disease

## 2010-08-07 ENCOUNTER — Other Ambulatory Visit: Payer: Self-pay | Admitting: *Deleted

## 2010-08-07 MED ORDER — CARVEDILOL 3.125 MG PO TABS: 3.1250 mg | ORAL_TABLET | Freq: Two times a day (BID) | ORAL | Status: DC

## 2010-08-12 ENCOUNTER — Ambulatory Visit: Payer: Self-pay | Admitting: Cardiovascular Disease

## 2010-09-07 NOTE — Assessment & Plan Note (Signed)
[facility]                            CARDIOLOGY OFFICE NOTE   [name], TECH                      MRN:          [id number]  DATE:10/03/2006                            DOB:          [birth year]    [name] returns today for followup.  She has had multiple previous  cardiac issues.  She has had a history of cardiomyopathy with an EF of  45%.  In regards to this, she has been doing fairly well.  She is  active.  She thinks she has been losing weight.  She has minimal  exertional dyspnea.  There has been no PND, orthopnea, no lower  extremity swelling.  She has not had her LV function checked in quite  some time.   She also has a history of paroxysmal atrial fibrillation and PVCs.  She  has not had any rapid palpitations.  There has been no syncope.  She  does not think she has been in atrial fibrillation recently.  The  patient is on chronic Coumadin.   She also has had a history of TIA with right CEA.  In regards to this,  she has followed up with Dr. Darrick Penna.  We need to get a copy of duplex  from January.  She has had a right CEA.  She is not having any recurrent  TIAs, CVAs, or neurological events.   The patient has no documented previous coronary artery disease.   She did have heart catheterization in 1997.  At that time, she had MR  with an EF of 15%, but no coronary disease.   Her last 2-D echocardiogram that I have in the chart is from 2005, and  showed an EF of 45% to 55%.   REVIEW OF SYSTEMS:  Remarkable for some concern about weight loss,  otherwise negative.   MEDICATIONS:  Includes:  1. Coreg 3.125 b.i.d.  2. Digitek 0.125 a day.  3. Lasix 20 a day.  4. Lipitor 20 a day.  5. Benazepril 40 a day.  6. Glimepiride 2 a day.  7. Coumadin as directed.   She gets her pro times checked at her medical doctor's office in  [place].   EXAMINATION:  She is a pleasant, elderly, thin, black female, in no  distress.  Affect is  appropriate.  She has an irregular heartbeat, which is likely PVCs.  The rate is 76.  Weight is 98.  Blood pressure is 130/70.  Respiratory rate is 14.  She  is afebrile.  HEENT:  Normal.  She has a small residual right bruit with a previous CEA.  There is no  left carotid bruit.  No lymphadenopathy.  No thyromegaly.  LUNGS:  Clear with normal diaphragmatic motion.  No wheezing.  There is an S1 and S2.  PMI is not increased, but not laterally  displaced.  There is a soft MR murmur.  ABDOMEN:  Benign.  Bowel sounds are positive.  No AAA.  No  hepatosplenomegaly.  No hepatojugular reflux.  No tenderness.  Femorals are +2 bilaterally without bruit.  There is no lower extremity  edema.  PTs are +2.  There is no lymphadenopathy.  No varicosities.  NEUROLOGIC:  Nonfocal.  SKIN:  Warm and dry.  There is no muscular weakness.  Her EKG shows sinus rhythm with occasional PVCs.  Nonspecific ST-T wave  changes.   FINAL IMPRESSION:  1. Probable nonischemic cardiomyopathy.  Need to reestablish or re-      estimated left ventricular function.  We will check a cardiac MRI      with gadolinium to assess for occult coronary disease and      quantitate her ejection fraction.  She will continue current      medications, including her current dose of Lasix and Digitek.  She      appears euvolemic.  2. History of atrial fibrillation.  Maintaining sinus rhythm.  Since      she has also had transient ischemic attacks and carotid disease,      she will be maintained on Coumadin.  This will be followed at the      [place] office.  3. History of carotid disease right carotid endarterectomy.  Follow up      with Dr. Darrick Penna in regards to her duplex.  No residual left-sided      bruit.  4. Hypercholesterolemia.  Continue Lipitor.  Check liver and lipid in      6 months.  5. Premature ventricular contractions, probably benign.  No indication      for electrophysiology study.  We will requantitate  ejection      fraction to make sure that there is no need for further work of her      premature ventricular contractions.   Overall, [name] is doing well, and I will see her back in 6 months as  long as her MRI shows her ejection fraction to be in the 40% range.     [name], MD, [facility]  Electronically Signed    PCN/MedQ  DD: 10/03/2006  DT: 10/03/2006  Job #: [id number]

## 2010-09-07 NOTE — Procedures (Signed)
CAROTID DUPLEX EXAM   INDICATION:  Followup of carotid disease.   HISTORY:  Diabetes:  Yes.  Cardiac:  CHF.  Hypertension:  Yes.  Smoking:  Yes.  Previous Surgery:  No.  CV History:  Asymptomatic.  Amaurosis Fugax No, Paresthesias No, Hemiparesis No.                                       RIGHT             LEFT  Brachial systolic pressure:         190               198  Brachial Doppler waveforms:         Triphasic         Triphasic  Vertebral direction of flow:        Antegrade         Antegrade  DUPLEX VELOCITIES (cm/sec)  CCA peak systolic                   104               94  ECA peak systolic                   166               118  ICA peak systolic                   217               128  ICA end diastolic                   52                46  PLAQUE MORPHOLOGY:                  Mixed             Mixed  PLAQUE AMOUNT:                      Moderate-to-severe                  Moderate  PLAQUE LOCATION:                    Bifurcation, ICA  Bifurcation, ICA   IMPRESSION:  1. 60-79% stenosis noted in the right internal carotid artery.  2. 40-59% stenosis noted in the left internal carotid artery.  3. Three vessels are very tortuous.   ___________________________________________  Janetta Hora Fields, MD   CJ/MEDQ  D:  03/25/2009  T:  03/25/2009  Job:  [id number]

## 2010-09-07 NOTE — Assessment & Plan Note (Signed)
[facility]                            CARDIOLOGY OFFICE NOTE   [name]                      MRN:          [id number]  DATE:04/15/2008                            DOB:          [birth year]    [name] returns today for followup.  She has previous history of  cardiomyopathy with an EF in the 45-49% range.   She has chronic exertional dyspnea, some of this is due to ongoing  smoking.  She is really reticent to cut back on her smoking.  She admits  to only a couple of packs a week, but I suspect she smokes more.  She is  upset about the care she is getting in our [facility].  She  seems to have difficulty accessing Dr. Lelon Perla.  She has had an URI  recently.  She feels that she has sinus congestion.  She does not  complain of cough or sputum production.  There has been no fevers.   She from a cardiac standpoint has been stable.  She has not had any  significant PND or orthopnea.  There has been no lower extremity edema.  She has not had chest pain or palpitations.   She did have a heart catheterization back in 97 when her EF was 15% and  there was no coronary disease.  She has improved some with her LV  function over the years and it is presumed that she continues to have a  nonischemic cardiomyopathy.   She also has peripheral vascular disease with 60-79% left ICA stenosis.  This is normally followed by Dr. Darrick Penna.  She is due to have a follow up  duplex in the middle of January.  She has not had any TIA symptoms.   Her risk factors were otherwise well modified.  I believe she is on  chronic Coumadin therapy for history of previous TIA.   PHYSICAL EXAMINATION:  GENERAL:  Remarkable for a thin frail black  female in no distress.  VITAL SIGNS:  Her weight is 122, blood pressure 150/80, pulse 76 and  regular, respiratory rate 14, afebrile.  HEENT:  Unremarkable.  NECK:  She has a right carotid bruit.  No lymphadenopathy,  thyromegaly,  or JVP elevation.  No supraclavicular lymph nodes.  LUNGS:  No active  wheezing with reasonable diaphragmatic motion.  CARDIAC:  S1, S2 with normal heart sounds.  PMI normal.  ABDOMEN:  Benign.  Bowel sounds positive.  No AAA.  No tenderness.  No  bruit.  No hepatosplenomegaly or hepatojugular reflux.  No tenderness.  EXTREMITIES:  Distal pulses are intact.  No edema.  NEUROLOGIC:  Nonfocal.  SKIN:  Warm and dry.  MUSCULOSKELETAL:  No muscular weakness.   EKG shows sinus rhythm with nonspecific ST-T wave changes, occasional  PVCs.   IMPRESSION:  1. History of nonischemic cardiomyopathy with some increasing fatigue      and shortness of breath.  Followup cardiac MRI to reassess left      ventricular function.  2. Hypertension, currently well controlled.  Continue current dose of  benazepril, low-sodium diet.  3. Question of upper respiratory (tract) infection, sinus infection.      She has poor medical followup at this time.  We will call her in      amoxicillin b.i.d. for 7 days to help expedite her care, so she      does not have to spend the money on an urgent care visit.  She will      call us if her sinuses start to drain or she has any high fevers.  4. Carotid disease.  Followup carotid duplex with Dr. Darrick Penna in      January.  Continue current dose of baby aspirin.  5. Previous history of transient ischemic attack.  Continue Coumadin.      She is getting home health to follow her Coumadin therapy.  It is      not followed at [facility].  She has not had any bleeding diathesis.  6. Smoking cessation.  She will call me if she is interested in      starting Wellbutrin.  She clinically would appear to have chronic      obstructive pulmonary disease.  7. History of restless leg syndrome.  Continue Metanx and which is      essentially vitamin B supplement seems to improve.  8. Diabetes.  Hemoglobin A1c quarterly.  Continue oral hypoglycemic.  9. Hypercholesterolemia  in the setting of carotid disease.  Continue      Lipitor.  Lipid and liver profile in 6 months.   Overall, I think [name]'s heart is stable.  I am little bit concerned of  her overall health and lack of time in medical care.  We will try to  help her as best we can.     [name]. [name], MD, [facility]  Electronically Signed    PCN/MedQ  DD: 04/15/2008  DT: 04/15/2008  Job #: [id number]

## 2010-09-07 NOTE — Procedures (Signed)
CAROTID DUPLEX EXAM   INDICATION:  Follow-up evaluation of known carotid artery disease.   HISTORY:  Diabetes:  Orally controlled.  Cardiac:  Congestive heart failure.  Hypertension:  Yes.  Smoking:  Less than a half pack per day.  Previous Surgery:  No.  CV History:  Patient had a TIA in 1997.  Patient had a previous duplex  on 04/28/06 which revealed a 20-39% left internal carotid artery  stenosis and a 40-59% right internal carotid artery stenosis.  Amaurosis Fugax No, Paresthesias No, Hemiparesis No                                       RIGHT             LEFT  Brachial systolic pressure:         166               168  Brachial Doppler waveforms:         Triphasic         Triphasic  Vertebral direction of flow:        Antegrade         Antegrade  DUPLEX VELOCITIES (cm/sec)  CCA peak systolic                   65                101  ECA peak systolic                   165               120  ICA peak systolic                   166               117  ICA end diastolic                   45                26  PLAQUE MORPHOLOGY:                  Calcific          Mixed  PLAQUE AMOUNT:                      Mild-to-moderate  Mild  PLAQUE LOCATION:                    Proximal ICA      Proximal ICA   IMPRESSION:  1. 40-59% right internal carotid artery stenosis.  2. 20-39% left internal carotid artery stenosis.  3. No significant change from previous study performed on 04/28/06.   ___________________________________________  Janetta Hora. Fields, MD   MC/MEDQ  D:  05/24/2007  T:  05/24/2007  Job:  [id number]

## 2010-09-07 NOTE — Procedures (Signed)
CAROTID DUPLEX EXAM   INDICATION:  followup known carotid artery disease.   HISTORY:  Diabetes:  Yes.  Cardiac:  CHF.  Hypertension:  Yes.  Smoking:  Yes.  Previous Surgery:  No.  CV History:  No.  Amaurosis Fugax No, Paresthesias No, Hemiparesis No                                       RIGHT             LEFT  Brachial systolic pressure:         130               138  Brachial Doppler waveforms:         Biphasic          Biphasic  Vertebral direction of flow:        Antegrade         Antegrade  DUPLEX VELOCITIES (cm/sec)  CCA peak systolic                   77                94  ECA peak systolic                   167               158  ICA peak systolic                   251               124  ICA end diastolic                   77                31  PLAQUE MORPHOLOGY:                  Heterogenous      Heterogenous  PLAQUE AMOUNT:                      Moderate to severe                  Mild  PLAQUE LOCATION:                    ICA and ECA       ICA and ECA   IMPRESSION:  1. 60-79% stenosis noted in the right ICA.  2. 40-59% stenosis noted in the left ICA.  3. Antegrade bilateral vertebral arteries.   ___________________________________________  Janetta Hora Fields, MD   MG/MEDQ  D:  05/07/2008  T:  05/07/2008  Job:  [id number]

## 2010-09-07 NOTE — Assessment & Plan Note (Signed)
[facility]                            CARDIOLOGY OFFICE NOTE   Debra, Hartman                      MRN:          [id number]  DATE:03/26/2007                            DOB:          03-Jun-1929    [name] is seen today in followup.   HISTORY:  She has a history of nonischemic cardiomyopathy and paroxysmal  atrial fibrillation with hypertension.  The patient has been doing well.  She is not having any significant palpitations, no PND or orthopnea, no  shortness of breath.  The patient has no documented coronary artery  disease.  Her last MRI showed her EF to be 49% with mild global  hypokinesis, this was in June.  She has been compliant with her meds and  following a low salt diet.  She is on chronic Coumadin due to her PAF  and cardiomyopathy.  She gets this checked at home once a month and is  followed by Dr. Allyne Gee on [facility].   REVIEW OF SYSTEMS:  Otherwise negative.   MEDICATIONS:  1. Coreg 3.125 b.i.d.  2. Digitek 0.125 a day.  3. Lasix 20 a day.  4. Lipitor 10 a day.  5. Benazepril 40 a day.  6. Glimepiride 2 mg a day.  7. Metanx.  8. Coumadin 4 mg a day.   She has no known allergies.   PHYSICAL EXAMINATION:  GENERAL:  She is a thin, frail, elderly, black  female in no distress.  Affect is jovial.  VITAL SIGNS:  Weight is 99 pounds, blood pressure is 140/70, pulse 78  and regular, respiratory rate 14, afebrile.  HEENT:  Unremarkable.  NECK:  Carotids normal without bruits.  She is status post right CEA.  LUNGS:  Clear with good diaphragmatic motion.  No wheezing.  HEART:  S1 S2.  Normal heart sounds.  PMI normal.  ABDOMEN:  Benign.  Bowel sounds positive.  __________ aorta palpable but  not tender.  No AAA.  No bruit.  No hepatosplenomegaly, hepatojugular  reflux.  EXTREMITIES:  Distal pulses are intact.  No edema.  SKIN:  Warm and dry.  NEUROLOGIC:  Nonfocal.  No muscular weakness.   IMPRESSION:  1.  History of paroxysmal atrial fibrillation, currently in a sinus      rhythm.  Continue beta-blocker and Coumadin.  2. History of cardiomyopathy.  No evidence of previous coronary      disease.  Ejection fraction  low normal, now at 49% by MRI.      Continue ACE inhibitor and current dose of Lasix.  Appears      euvolemic, follow up BNP in 6 months.  3. Hypertension, currently well controlled.  Continue current      medications including diuretic and ACE inhibitor.  4. Diabetes.  She is monitoring her sugars at home.  Dr. Allyne Gee will      do a hemoglobin A1c quarterly.  She will continue her oral      hypoglycemics.  She is not on Actos or anything else that would      exacerbation her congestive heart failure.  5.  Coumadin therapy being followed by Dr. Allyne Gee, not part of our      Coumadin Clinic.  She says it has been going well.  No bleeding      diathesis, may need screening colonoscopy in the future.  I will      leave this up to Dr. Allyne Gee.  6. Hypercholesterolemia.  Continue Lipitor 10 mg a day.  Lipid and      liver profile in 6 months.  Overall, [name] is doing well and I      will see her back in 6 months.     [name], MD, [facility]  Electronically Signed    PCN/MedQ  DD: 03/26/2007  DT: 03/26/2007  Job #: [id number]   cc:   Candyce Churn. Allyne Gee, M.D.

## 2010-09-07 NOTE — Procedures (Signed)
CAROTID DUPLEX EXAM   INDICATION:  Carotid disease.   HISTORY:  Diabetes:  Yes.  Cardiac:  CHF.  Hypertension:  Yes.  Smoking:  Yes.  Previous Surgery:  No.  CV History:  Currently asymptomatic.  Amaurosis Fugax No, Paresthesias No, Hemiparesis No                                       RIGHT             LEFT  Brachial systolic pressure:         168               174  Brachial Doppler waveforms:         Normal            Normal  Vertebral direction of flow:        Antegrade         Antegrade  DUPLEX VELOCITIES (cm/sec)  CCA peak systolic                   74                87  ECA peak systolic                   185               135  ICA peak systolic                   223               97  ICA end diastolic                   62                21  PLAQUE MORPHOLOGY:                  Calcific          Heterogeneous  PLAQUE AMOUNT:                      Moderate          Mild  PLAQUE LOCATION:                    ICA / ECA         ICA / ECA   IMPRESSION:  1. Doppler velocity suggests a 60%-79% stenosis of the right proximal      internal carotid artery, however, percentage of stenosis may be      underestimated due to calcific plaque shadowing.  2. No hemodynamically significant stenosis noted in the left proximal      internal carotid artery.  3. Doppler velocities of the left internal carotid artery appear less      than previously recorded when compared to the previous exam on      03/25/2009 with no significant change in the right internal carotid      artery noted.   ___________________________________________  Janetta Hora Fields, MD   CH/MEDQ  D:  10/29/2009  T:  10/29/2009  Job:  [id number]

## 2010-09-07 NOTE — Assessment & Plan Note (Signed)
[facility]                            CARDIOLOGY OFFICE NOTE   FLO, BERROA                      MRN:          [id number]  DATE:09/24/2007                            DOB:          Oct 19, 1929    ADDENDUM:  Right carotid bruit.   The patient has been followed by Dr. Darrick Penna in [facility] in regards to her  carotid duplexes.  Her last carotid duplex was done in January.  I think  it was misread.  Her peak systolic velocity is 166.  Her peak end-  diastolic velocity in the right ICA was 45.  This would be equivalent to  a 60-79% stenosis.  Overall this is stable and similar to the duplex  that was done in February 2008.   The patient has not any recurrent TIA symptoms.  We will continue  aspirin and Coumadin therapy.     [name], MD, [facility]     PCN/MedQ  DD: 09/24/2007  DT: 09/24/2007  Job #: [id number]

## 2010-09-07 NOTE — Assessment & Plan Note (Signed)
[facility]                            CARDIOLOGY OFFICE NOTE   SARIKA, BALDINI                      MRN:          [id number]  DATE:09/24/2007                            DOB:          1930-02-02    HISTORY OF PRESENT ILLNESS:  [name] returns today for follow-up.  She  has had a history nonischemic cardiomyopathy.  Her last ejection  fraction was 49% by cardiac MRI on October 05, 2006.   She has not had any undue shortness of breath, palpitations PND or  syncope.   Unfortunately, she has smoking about two packs a week.  I counseled her  for less than 10 minutes about this.  She does not appear very motivated  to quit.   She would be reasonable candidate for Wellbutrin.  I will leave this up  Dr. Lelon Perla to institute   She has had a lot of myalgias and aching this in her shoulders and legs.  There is no previous clinical history of claudication.  The pain does  sound arthritic in nature as it affects her shoulders, knees and legs.  She has had over-the-counter medications.  Since she has no documented  coronary disease, she may be reasonable candidate for Celebrex.   Otherwise, she has been compliant with her medications.  There has been  no lower extremity edema.  No PND, orthopnea and minimal exertional  dyspnea.   MEDICATIONS:  1. Coreg 3.25 b.i.d.  2. Digitek 0.125 a day.  3. Furosemide 20 a day.  4. Lipitor 10 a day.  5. Benazepril 40 a day.  6. Glyburide 2 mg a day.  7. Coumadin as directed.   Most of her lab work and Coumadin levels were checked either by social  services or Dr. Allyne Gee office.  We do not follow them.  Her exam is  remarkable today for irregular heartbeat.  EKG shows sinus rhythm with  PVCs.  Rate is relatively slow, afebrile.  Affect appropriate.  Blood  pressure 140/80, respiratory rate 14, weight is only 99 pounds.   PHYSICAL EXAMINATION:  HEENT:  Unremarkable.  NECK:  She has a right carotid bruit, no  lymphadenopathy, thyromegaly  JVP elevation.  LUNGS:  Clear diaphragmatic motion.  No wheezing.  There is an S1-S2  with a mildly increased PMI.  No murmur.  ABDOMEN:  Benign.  Bowel sounds positive.  No AAA.  No bruit.  No  hepatosplenomegaly.  No hepatojugular reflux.  Distal pulses are intact,  no edema.  No evidence of vascular insufficiency.  NEUROLOGICAL:  Nonfocal.  SKIN:  Warm and dry.  No muscular weakness.   STUDIES:  EKG shows sinus rhythm, nonspecific ST-T wave changes due to  fact of somewhat slow heart rate with PVCs   IMPRESSION:  1. Cardiomyopathy, stable, euvolemic with current dose of Lasix,      follow-up B-met and BMP in 6 months.  2. Irregular heartbeat, history of PAF.  Continue Coumadin therapy.      Check digoxin level to make sure it is not too high.  Continue low-      dose  Coreg.  Would not increase any further due to relative      bradycardia.  3. Hypercholesterolemia.  Continue Lipitor 10 mg a day, lipid and      liver profile in 6 months.  4. Hypertension, currently reasonably well controlled in light of her      diabetes.  Continue benazepril as a good hypertensive agent, low-      salt diet.  5. Diabetes.  Follow-up hemoglobin A1c.  We will check this today      since she needs other lab work including her digoxin level.  6. Pain in legs, shoulders and knees.  Likely is arthritis.  Check      ANA, rheumatoid factor as well as a sed rate.  Follow-up with Dr.      Lelon Perla.  May be reasonable candidate for Mobic or Celebrex.  7. Smoking.  I talked to the patient for less than 10 minutes.  She      does not seem real motivated to quit.  She does not apparently      think that two packs a week is all that much.  However, since she      smokes so little, I think she would be a good candidate for      Wellbutrin.  Will leave it up Dr. Lelon Perla to further address this.      In regards to starting, I would not give her nicotine replacement      given her  history of arrhythmia and PVCs.  8. Right carotid bruit.  The patient has been followed by Dr. Darrick Penna      in [facility] in regards to her carotid duplexes.  Her last carotid      duplex was done in January.  I think it was misread.  Her peak      systolic velocity is 166.  Her peak end-diastolic velocity in the      right ICA was 45.  This would be equivalent to a 60-79% stenosis.      Overall this is stable and similar to the duplex that was done in      February 2008.   The patient has not any recurrent TIA symptoms.  We will continue  aspirin and Coumadin therapy.   Overall, I think the patient's cardiac status is stable.  We will check  her lab work in regards to her relative arrhythmia.  She is not in A  fib, but has had it in the past and given that and her cardiomyopathy,  she should remain on Coumadin.  INR is being checked by home health  nurses.  The patient has not had any bleeding diathesis or  complications.     [name], MD, [facility]  Electronically Signed    PCN/MedQ  DD: 09/24/2007  DT: 09/24/2007  Job #: [id number]

## 2010-09-10 ENCOUNTER — Other Ambulatory Visit: Payer: Self-pay | Admitting: *Deleted

## 2010-09-10 MED ORDER — DIGOXIN 125 MCG PO TABS: 125.0000 ug | ORAL_TABLET | Freq: Every day | ORAL | Status: DC

## 2010-09-10 NOTE — Assessment & Plan Note (Signed)
[facility]                            CARDIOLOGY OFFICE NOTE   KYRIA, BUMGARDNER                      MRN:          [id number]  DATE:04/04/2006                            DOB:          01/27/1930    [name] returns today for followup. She has had paroxysmal atrial  fibrillation with a previous TIA, nonischemic cardiomyopathy.   She is currently functioning at class I, she is doing well. She is  getting her Coumadin checked on a monthly basis.   She has carotid disease in the right that is 60-80%. She needs a  followup appointment with Dr. Darrick Penna for this. He has been following it.  There has been some discrepancy between ultrasound and CTA.   Her review of systems otherwise benign. She has not had any significant  palpitations, PND or orthopnea.   MEDICATIONS:  1. Coreg 3.125 b.i.d.  2. Digoxin 0.125 a day.  3. Lasix 20 a day.  4. Lipitor 10 a day.  5. Benazepril 40 a day.  6. __________  2 mg a day.  7. Coumadin 4 mg a day.   PHYSICAL EXAMINATION:  HEENT:  Normal.  VITAL SIGNS:  Blood pressure is 120/70, pulse 76 with occasional PVCs.  NECK:  There is no lymphadenopathy, no thyromegaly. There is no carotid  bruits that I can detect despite the high grade right lesion by  ultrasound.  LUNGS:  Clear.  HEART:  There is an S1 with a split second heart sound. There is no MR.  ABDOMEN:  Benign.  EXTREMITIES:  Lower extremities intact pulses, no edema.   EKG showed sinus rhythm with occasional PVCs.   IMPRESSION:  Stable cardiomyopathy, ejection fraction had improved on  last ultrasound I believe into the 45% range. Continue current  medications, switch to generic Coreg for cost savings. Continue to  followup in the Coumadin clinic for history of paroxysmal atrial  fibrillation  and a transient ischemic attack. Follow with Dr. Darrick Penna regarding her  right carotid stenosis. I will see her back in 6 months.     [name]. Debra Emms, MD,  [facility]  Electronically Signed    PCN/MedQ  DD: 04/04/2006  DT: 04/04/2006  Job #: [id number]

## 2010-09-10 NOTE — Discharge Summary (Signed)
[place]. [facility]  Patient:    SAAVI, MCEACHRON                      MRN: [id number] Adm. Date:  [id number] Disc. Date: [id number] Attending:  Ammie Dalton                           Discharge Summary  DISCHARGE DIAGNOSES: 1. Hematochezia. 2. Internal hemorrhoids causing hematochezia. 3. Hiatal hernia. 4. Congestive heart failure. 5. Atrial fibrillation. 6. Non-insulin-dependent diabetes mellitus. 7. Hypertension. 8. Chronic seasonal allergies and sinusitis.  HOSPITAL COURSE:  Ms. Higuchi was admitted for a likely lower GI bleed.  Upper endoscopy was obtained due to her persistent hematochezia which only revealed a hiatal hernia.  There were no ulcerations.  Dr. Randa Evens felt that colonoscopy was indicated at that point to further elaborate the source of her bleeding.  She did well and had no exacerbation of congestive heart failure or poor control of her rate with atrial fibrillation.  She remained stable.  She did not become hemodynamically unstable or require any transfusions.  Diabetes and hypertension were well controlled with diet and medications.  A colonoscopy was performed that did show internal hemorrhoids which could explain her bleeding.  DISCHARGE MEDICATIONS:  The patient will continue her same medications at home in addition to Metamucil and Colace as needed.  She may restart Coumadin.  ACTIVITY:  No increase in activity but no restrictions.  DIET:  Continue high-fiber diet.  FOLLOWUP:  In two weeks with Dr. [name] and as directed by Dr. Randa Evens. DD:  11/17/99 TD:  11/20/99 Job: [id number]

## 2010-09-10 NOTE — Procedures (Signed)
[place]. [facility]  Patient:    [name], [name]                      MRN: [id number] Proc. Date: 10/18/99 Adm. Date:  [id number] Attending:  Ammie Dalton CC:         Ammie Dalton, M.D.                           Procedure Report  PROCEDURE:  Colonoscopy.  MEDICATIONS:  Fentanyl 50 mcg, Versed 5 mg IV.  INDICATIONS:  GI bleeding with a negative upper endoscopy.  Patient has been on Coumadin.  This has been on hold.  SCOPE:  Olympus pediatric video colonoscope.  DESCRIPTION OF PROCEDURE:  Procedure being explained to patient, consent obtained.  Patient in left lateral decubitus position, the Olympus pediatric video colonoscope inserted, advanced under direct visualization.  Prep excellent, no blood seen throughout the colon whatsoever.  We were able to advance rapidly to the cecum.  The ileocecal valve and appendiceal orifice seen.  No AVMs seen in the cecum.  No signs of recent or active bleeding. Scope withdrawn.  Cecum, ascending colon, hepatic flexure, transverse colon, splenic flexure, descending and sigmoid colon seen well.  No diverticular disease of any significance.  No polyps or other lesions.  Scope was withdrawn back in the rectum.  The rectum was free of polyps.  There were some internal hemorrhoids and the rectum seen well upon  removal of the scope.  Scope withdrawn.  Patient tolerated the procedure well.  ASSESSMENT:  Internal hemorrhoids, no other source of GI bleeding seen in the colon.  PLAN:  Will treat hemorrhoids with stool softeners and follow clinically. DD:  10/18/99 TD:  10/19/99 Job: [id number]

## 2010-09-10 NOTE — Op Note (Signed)
NAMEPAUL, [name]               ACCOUNT NO.:  [id number]   MEDICAL RECORD NO.:  [id number]          PATIENT TYPE:  OIB   LOCATION:  2899                         FACILITY:  [facility]   PHYSICIAN:  Janetta Hora. Fields, MD  DATE OF BIRTH:  [birth year]   DATE OF PROCEDURE:  07/20/2004  DATE OF DISCHARGE:                                 OPERATIVE REPORT   PROCEDURE:  Carotid angiogram.   PREOPERATIVE DIAGNOSIS:  Asymptomatic stenosis, right internal carotid  artery.   POSTOPERATIVE DIAGNOSIS:  Asymptomatic stenosis, right internal carotid  artery.   ANESTHESIA:  Local.   INDICATIONS:  The patient has a history of an asymptomatic right internal  carotid artery stenosis.  This was recently duplexed at [facility] and  suggested a greater than 80% stenosis.  A CT angiogram was then obtained,  which showed a moderate amount of calcification but suggested and 85%  stenosis of the right internal carotid artery.  She now presents for  elective carotid angiogram to determine the precise amount of stenosis.   OPERATIVE DETAILS:  After obtaining informed consent, the patient was  brought to the [facility] lab.  The patient was placed in supine position on the  angiography table.  Next, the right groin was prepped and draped in the  usual sterile fashion.  Local anesthesia was infiltrated over the right  common femoral artery.  A Majestic needle was then used to cannulate the  right common femoral artery and a 0.035 Wholey wire advanced into the  abdominal aorta under fluoroscopic guidance.  The needle was removed and a 5-  French sheath placed over the guidewire into the right common femoral  artery.  The guidewire was then advanced up into the descending thoracic  aorta and a 5-French pigtail catheter threaded over the guidewire and both  advanced as a unit into the aortic arch.  An arch aortogram was then  obtained that shows normal arch anatomy with a lengthy innominate artery.  The origins of the  innominate, left common carotid and left subclavian are  widely patent.  The left and right vertebral arteries are patent.  Left and  right subclavian arteries are patent.  Next, the pigtail catheter was  removed and a Berenstein II catheter placed over the guidewire to  selectively cannulate the innominate artery.  This shows a widely patent and  large right vertebral artery and a widely patent right common carotid  artery.  The right subclavian artery is also widely patent.  Next, the  guidewire was advanced into the right common carotid artery for a selective  right common injection.  This was done in an AP and lateral projection.  Also, intracranial views were performed in AP and lateral projection, which  will be interpreted by the neuroradiologist.  The right carotid artery has  approximately a 40-50% stenosis of the internal carotid artery just after  its takeoff.  There is mild plaque also at the origin of the right internal carotid  artery.  There is no flow-limiting stenosis.  The external carotid artery on  the right side is widely patent.  Next,  the catheter was pulled back into  the aortic arch over a guidewire.  The catheter was then used to select the  left common carotid artery.  Initially the left vertebral artery was  selected and the catheter was pulled back out of this.  Also, care was maintained during the entire procedure to flush and aspirate  the catheters as per usual protocol.  After the catheter was pulled back out  of the left vertebral and left subclavian artery, the left common carotid  artery was selected and the catheter advanced into this.  A left common  carotid selective injection was then obtained also with intracranial views  in AP and lateral projection.  Left carotid artery has minimal  atherosclerotic plaque.  There is essentially no stenosis of the internal or  external carotid artery on the left side.  An additional oblique view was  obtained, and  again this shows no significant stenosis of the internal or  external carotid artery.  Next, the catheter was removed over a guidewire  and both of these removed as a unit through the sheath.  The 5-French sheath  was then removed and hemostasis obtained with direct pressure.  The patient  tolerated procedure well, and there were no complications.  The patient was  taken to the recovery room in stable condition.   FINDINGS:  1.  Normal arch anatomy with no significant great vessel stenosis.  2.  A 40-50% stenosis of the right internal carotid artery just after the      carotid bifurcation.  3.  No significant stenosis, left carotid artery.      CEF/MEDQ  D:  07/20/2004  T:  07/20/2004  Job:  [id number]

## 2010-09-10 NOTE — Op Note (Signed)
NAME:  [name], [name]                         ACCOUNT NO.:  [id number]   MEDICAL RECORD NO.:  [id number]                   PATIENT TYPE:  EMS   LOCATION:  MINO                                 FACILITY:  [facility]   PHYSICIAN:  Graylin Shiver, M.D.                DATE OF BIRTH:  09-08-1929   DATE OF PROCEDURE:  01/07/2003  DATE OF DISCHARGE:                                 OPERATIVE REPORT   PROCEDURE:  Esophagogastroduodenoscopy with foreign body removal.   INDICATION FOR PROCEDURE:  This patient is a 77 year old black female who  presented to the [facility] emergency department with complaint of  difficulty swallowing.  She stated that two days ago she was eating chicken  and felt something lodge in her throat.  Since then she has been having  trouble swallowing.  She has been able to eat and drink a little bit, but  she feels as though something is obstructing the passage of her food and she  is unable to take her pills.  It was felt that the patient probably had a  meat impaction in her esophagus.  The procedure of EGD with foreign body  removal was explained to the patient along with the potential risks of  bleeding, infection, and perforation.  She understood and consented to the  procedure.   PREMEDICATION:  Fentanyl 40 mcg IV, Versed 5 mg IV.   DESCRIPTION OF PROCEDURE:  With the patient in the left lateral decubitus  position, the Olympus gastroscope was inserted into the oropharynx and  passed into the cervical esophagus.  At this point I saw a meat impaction  right at the level of the upper esophageal sphincter area.  The meat  impaction was dislodged from the proximal esophagus with a simple angulation  and passage of the scope.  I then advanced the scope, pushing the meat  impaction down into the stomach.  The scope was then advanced into the  duodenum, which looked normal.  The stomach was then inspected, which showed  a mild gastritis.  The scope was retroflexed, and  no lesions were seen in  the fundus or cardia.  I then brought the scope back into the esophagus and  looked at the area of the proximal esophagus again.  I saw no specific  stricture.  I did, however, see another linear piece of meat, which appeared  to be encircling about half the lumen of the esophagus.  This was pushed off  of the mucosa and dislodged and underneath this, which was dislodged also,  was a 2 cm long thin, sharp chicken bone.  This passed into the esophageal  lumen and I  guided the bone and meat down into the stomach.  At this point  a Lucina Mellow retrieval net was used to grasp the chicken bone, and it was brought  out and retrieved.  There was a small hematoma noted in  the cervical  esophagus where the chicken bone and meat impaction had been lodged.  She  tolerated the procedure well without complications.   IMPRESSION:  Meat impaction and a 2 cm long thin, sharp chicken bone in the  proximal esophagus, which were removed.   The patient was instructed to be on clear liquids only tonight and to  attempt soft types of foods tomorrow such as mashed potatoes if tolerated.  I suggested to her also to use throat lozenges as needed.  She is to resume  her medications tomorrow.                                               Graylin Shiver, M.D.    SFG/MEDQ  D:  01/07/2003  T:  01/08/2003  Job:  [id number]   cc:   Candyce Churn. Allyne Gee, M.D.  [address]  [address]  [place]  [place] [address]  Fax: [phone]   Llana Aliment. Malon Kindle., M.D.  [address], [address]  [place]  [place] [address]  Fax: [phone]

## 2010-09-10 NOTE — Consult Note (Signed)
[name], [name]               ACCOUNT NO.:  [id number]   MEDICAL RECORD NO.:  [id number]          PATIENT TYPE:  OIB   LOCATION:  2899                         FACILITY:  [facility]   PHYSICIAN:  Janeece Riggers. Karin Golden, M.D.   DATE OF BIRTH:  Nov 14, 1929   DATE OF CONSULTATION:  DATE OF DISCHARGE:                                   CONSULTATION   HISTORY:  A 77 year old with asymptomatic right internal carotid artery  stenosis.  Progressive stenotic disease by noninvasive exams.  We have been  requested to interpret the intracranial views from a cerebral arteriogram  done July 20, 2004, by Dr. Darrick Penna.   RIGHT INTERNAL CAROTID ARTERIOGRAM:  This vessel is opacified via a common  carotid injection.  There is no siphon stenosis.  Flow from this injection  supplies the right middle cerebral artery territory and gives minimal supply  to the anterior cerebral artery territory because of a hypoplastic A1  segment.  There is no evidence of aneurysm, intracranial stenosis, or  vascular malformation.  The parenchymal and venous phases are normal.   LEFT INTERNAL CAROTID ARTERIOGRAM:  This vessel is opacified via a common  carotid injection.  The carotid siphon is widely patent into the brain.  Flow from this injection supplied the left middle cerebral artery territory  as well as both anterior cerebral artery territories.  There was no evidence  of proximal stenosis, aneurysm, or vascular malformation.  The parenchymal  and venous phases are normal.   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      MES/MEDQ  D:  07/20/2004  T:  07/20/2004  Job:  [id number]

## 2010-09-10 NOTE — Consult Note (Signed)
[place]. [facility]  Patient:    [name], [name]                      MRN: [id number] Proc. Date: 10/14/99 Adm. Date:  [id number] Disc. Date: [id number] Attending:  Ammie Dalton CC:         Ammie Dalton, M.D.                          Consultation Report  REASON FOR CONSULTATION:  Gastrointestinal bleeding.  HISTORY:  Very delightful, 77 year old black female, patient of Dr. Theda Hartman, who presented to the emergency room after having melenic stools for several days.  She had a fairly normal bowel movement four days ago and then her stools became quite dark.  She has not seen any bright red blood.  She has had a lot of gas and has had melenic stools for a couple of days.  She has been on salicylate for arthritis, but stopped this a couple of weeks ago.  She had not been weak or dizzy, not had any vomiting, and denies any abdominal pain.  Her bowel movements have consisted primarily of dark stools for the past several days.  She was seen in the emergency room with this history.  She had an evaluation by the emergency room physician revealing positive stools.  The patient has been eating well and has not had any abdominal pain, etcetera.  CURRENT MEDICATIONS:  Coumadin dose unclear, salicylate in the past but stopped two weeks ago, Lasix currently on hold, Lotensin 20 mg q.d., Glucotrol, Coreg, Lanoxin, and Paxil.  ALLERGIES:  No known drug allergies.  MEDICAL HISTORY: 1. History of congestive heart failure and atrial fibrillation. 2. Questionable history of CI in the distant past. 3. History of non-insulin-dependent diabetes. 4. Hypertension. 5. Surgeries include sinus surgery only.  FAMILY HISTORY:  Mother died at 27.  Father died at 58 with heart problems. She has six children who are healthy.  The only cancer in the family is her mother who had head and neck cancer many years ago and was treated with radiation.  SOCIAL HISTORY:  She is  divorced and lives with her daughter.  She does not drink.  She does smoke.  PHYSICAL EXAMINATION:  VITAL SIGNS:  Temperature normal, blood pressure 141/58, pulse 86.  GENERAL:  Pleasant, alert, black female in no acute distress.  HEENT:  Eyes:  Sclerae anicteric, extraocular movements intact.  NECK:  Supple.  LUNGS:  Clear anteriorly with bibasilar rales posteriorly.  ABDOMEN:  Soft, completely nontender.  RECTAL:  Not repeated.  Stool was grossly positive by the emergency room physician.  LABORATORY DATA:  INR at 2.5, hemoglobin 13.1.  ASSESSMENT:  Painless gastrointestinal bleeding.  The patient is currently stable given the fact that she has been on non-steroidals it may be that she did have a small ulcer and I think we should go ahead and evaluate this first.  There is certainly no reason for an urgent endoscopy at this point in time.  PLAN:  We will schedule her for an endoscopy tomorrow afternoon.  If this is negative, she may well need a colonoscopy.  I will also give her an empirically proton pump inhibitor in case there is an ulcer causing the bleeding.  At this point, I would simply hold her Coumadin.  I do not think it necessarily needs to be reversed. DD:  10/14/99 TD:  10/14/99 Job: [id number]

## 2010-09-10 NOTE — H&P (Signed)
[place]. [facility]  Patient:    [name], [name]                      MRN: [id number] Adm. Date:  [id number] Disc. Date: [id number] Attending:  Ammie Dalton                         History and Physical  CHIEF COMPLAINT: Hematochezia and melanotic stools.  HISTORY OF PRESENT ILLNESS: This patient is a 77 year old black female with congestive heart failure and atrial fibrillation, and non-insulin dependent diabetes in very good control, who presented to the emergency room with bright red blood per rectum and melena.  She has overall been feeling well and doing well with no nausea, vomiting, abdominal pain, frank diarrhea, or other cramping sensations.  However, over the last three days she has noted bright red blood on her underwear and melanotic stools.  She has otherwise been doing well.  She does have some rectal/anal discomfort.  She feels a little weak.  PAST MEDICAL HISTORY:  1. History of rate controlled atrial fibrillation.  2. Mild congestive heart failure.  3. Status post sinus surgery.  4. History of chronic sinusitis and seasonal allergies.  5. History of hypertension.  6. History of non-insulin dependent diabetes.  MEDICATIONS:  1. Coumadin 4.5 mg p.o. q.d. alternating with 5 mg p.o. q.d.  2. Lotensin 5 mg p.o. q.a.m.  3. Lasix 1 mg p.o. q.d.  4. Coreg 2.5 mg p.o. b.i.d.  5. Lanoxin 0.125 mg p.o. q.d.  6. Paxil 10 mg p.o. q.d.  7. Glucotrol 2.5 mg p.o. q.d.  ALLERGIES: No known drug allergies.  SOCIAL HISTORY: She lives in a house with multiple family members including a daughter.  No smoking or alcohol use.  No IV drug use.  FAMILY HISTORY: Coronary artery disease.  Unknown neck cancer in her mother.  REVIEW OF SYSTEMS: No fevers, chills, cough, cold symptoms other than her allergy symptoms baseline.  No nausea, vomiting, constipation, bleeding from any other source in her body, neck pain, headache, chest pain, shortness  of breath, wheezing, back pain, leg pain complaints other than listed in the HPI.  PHYSICAL EXAMINATION:  VITAL SIGNS: Temperature 97.4 degrees, heart rate 59, respirations 20, blood pressure 133/58.  Weight 104 pounds.  HEENT: Head normocephalic, atraumatic.  EOMI. PERRLA.  Clear posterior pharynx.  Normal TMs.  NECK: Supple without adenopathy, JVD, thyromegaly, or bruits.  CHEST: Clear to auscultation and percussion without rhonchi, rales, or wheezes.  CARDIOVASCULAR: Regular rate without murmurs, rubs, or gallops.  ABDOMEN: Soft, nontender, nondistended.  Bowel sounds positive without hepatosplenomegaly.  RECTAL: Hemoccult positive grossly per ER physician.  GU: Deferred.  LABORATORY DATA: Hemoglobin 13.1, WBC 14.8, platelets 155,000.  Type and screen was obtained.  Potassium 3.5, creatinine 0.8.  Normal LFTs.  PT 20.8, INR 2.5.  ASSESSMENT/PLAN: This patient is a pleasant 77 year old black female well known to me with a history of atrial fibrillation, diabetes, and congestive heart failure (in good control), who presents with a gastrointestinal bleed on Coumadin therapy.  1. GI bleed.  Will admit for evaluation.  I consulted Dr. Randa Evens, who will     evaluate for a lower GI source.  She will also likely require upper     endoscopy to rule out ulcer.  I will hold Coumadin.  Will place her on     fluids and have a second saline lock available.  Obtain frequent  CBCs.     Will keep type and screen active.  Will also follow PT and INR.  2. Atrial fibrillation, rate controlled without any problems.  Continue Coreg     and Lotensin and Lanoxin.  3. Diabetes.  Will hold Glucotrol since she is NPO and will follow     Accu-Cheks.  Will start a diabetic diet when she finishes NPO.  4. Congestive heart failure, stable, with no evidence of failure.  Will hold     Lasix so she does not get dehydrated with fluid. DD:  11/17/99 TD:  11/18/99 Job: [id number]

## 2010-10-29 ENCOUNTER — Encounter: Payer: Self-pay | Admitting: Cardiovascular Disease

## 2010-10-29 ENCOUNTER — Ambulatory Visit (INDEPENDENT_AMBULATORY_CARE_PROVIDER_SITE_OTHER): Payer: Medicare Other | Admitting: Cardiovascular Disease

## 2010-10-29 DIAGNOSIS — I4891 Unspecified atrial fibrillation: Secondary | ICD-10-CM

## 2010-10-29 DIAGNOSIS — I10 Essential (primary) hypertension: Secondary | ICD-10-CM

## 2010-10-29 DIAGNOSIS — I428 Other cardiomyopathies: Secondary | ICD-10-CM

## 2010-10-29 DIAGNOSIS — E7800 Pure hypercholesterolemia, unspecified: Secondary | ICD-10-CM

## 2010-10-29 NOTE — Assessment & Plan Note (Signed)
Euvolemic with no congestive symptoms Continue current meds

## 2010-10-29 NOTE — Assessment & Plan Note (Signed)
Cholesterol is at goal.  Continue current dose of statin and diet Rx.  No myalgias or side effects.  F/U  LFT's in 6 months. Lab Results  Component Value Date   LDLCALC  Value: 73        Total Cholesterol/HDL:CHD Risk Coronary Heart Disease Risk Table                     Men   Women  1/2 Average Risk   3.4   3.3  Average Risk       5.0   4.4  2 X Average Risk   9.6   7.1  3 X Average Risk  23.4   11.0        Use the calculated Patient Ratio above and the CHD Risk Table to determine the patient's CHD Risk.        ATP III CLASSIFICATION (LDL):  <100     mg/dL   Optimal  100-129  mg/dL   Near or Above                    Optimal  130-159  mg/dL   Borderline  160-189  mg/dL   High  >190     mg/dL   Very High 05/01/2010             

## 2010-10-29 NOTE — Patient Instructions (Signed)
Follow up with Dr [name] in 6 months  Stop Coumadin Start a 81 mg ASA Please continue all other medications as listed

## 2010-10-29 NOTE — Assessment & Plan Note (Signed)
Well controlled.  Continue current medications and low sodium Dash type diet.    

## 2010-10-29 NOTE — Assessment & Plan Note (Signed)
NSR for long time Stop coumadin given risks to bleeding and see how she does

## 2010-10-29 NOTE — Progress Notes (Signed)
[name] was recently hospitalzied for UTI, COPD exacerbation and diatolic CHF. BP seems suboptimaly controlled despite 3 drug Rx. She has a history of DCM with no CAD EF 45% by MRI in 04/2008. She denies SSCP, palpitations or dyspnea. She has had no edema and continues to be very thin. she has been compliant with her meds. Her INR's have been Rx with no bleeding diathesis She just had a duplex at Dr [facility] office and her RICA is stable with 60-79% stenosis  Compliant with meds Has recurrent sinus problems and injured right shoulder.    Reviewed her chart and I am not sure she still needs to be on coumadin.  She has had distant PAF and mild CHF.  Given her age, frailty and low body weight I think we should stop it and see how she does  ROS: Denies fever, malais, weight loss, blurry vision, decreased visual acuity, cough, sputum, SOB, hemoptysis, pleuritic pain, palpitaitons, heartburn, abdominal pain, melena, lower extremity edema, claudication, or rash.  All other systems reviewed and negative  General: Affect appropriate Thin and chronically ill HEENT: normal Neck supple with no adenopathy JVP normal no bruits no thyromegaly Lungs clear with no wheezing and good diaphragmatic motion Heart:  S1/S2 no murmur,rub, gallop or click PMI normal Abdomen: benighn, BS positve, no tenderness, no AAA no bruit.  No HSM or HJR Distal pulses intact with no bruits No edema Neuro non-focal Skin warm and dry No muscular weakness   Current Outpatient Prescriptions  Medication Sig Dispense Refill  . atorvastatin (LIPITOR) 10 MG tablet Take 10 mg by mouth. Take 1 tablet on M-W- F       . benazepril (LOTENSIN) 40 MG tablet Take 40 mg by mouth daily.        . carvedilol (COREG) 3.125 MG tablet Take 1 tablet (3.125 mg total) by mouth 2 (two) times daily with a meal.  30 tablet  6  . digoxin (LANOXIN) 0.125 MG tablet Take 1 tablet (125 mcg total) by mouth daily.  30 tablet  6  . furosemide (LASIX) 20 MG tablet  Take 20 mg by mouth daily.        Marland Kitchen glimepiride (AMARYL) 2 MG tablet Take 2 mg by mouth daily before breakfast.        . Multiple Vitamin (MULTIVITAMIN) tablet Take 1 tablet by mouth daily.        Marland Kitchen warfarin (COUMADIN) 4 MG tablet 4 mg. As directed       . DISCONTD: amLODipine (NORVASC) 5 MG tablet Take 5 mg by mouth daily.        Marland Kitchen DISCONTD: atorvastatin (LIPITOR) 40 MG tablet 40 mg. 1 po mon, wed, fri         Allergies  Review of patient's allergies indicates no known allergies.  Electrocardiogram:  NSR 68 nonspecific ST/T wave changes  Assessment and Plan

## 2010-12-16 ENCOUNTER — Other Ambulatory Visit: Payer: Self-pay | Admitting: *Deleted

## 2010-12-16 MED ORDER — CARVEDILOL 3.125 MG PO TABS: 3.1250 mg | ORAL_TABLET | Freq: Two times a day (BID) | ORAL | Status: DC

## 2011-02-10 LAB — CREATININE, SERUM
Creatinine, Ser: 0.77
GFR calc Af Amer: >60
GFR calc non Af Amer: >60

## 2011-03-28 ENCOUNTER — Other Ambulatory Visit: Payer: Self-pay | Admitting: Cardiovascular Disease

## 2011-03-28 MED ORDER — CARVEDILOL 3.125 MG PO TABS: 3.1250 mg | ORAL_TABLET | Freq: Two times a day (BID) | ORAL | Status: DC

## 2011-04-05 ENCOUNTER — Other Ambulatory Visit: Payer: Self-pay | Admitting: *Deleted

## 2011-04-05 MED ORDER — DIGOXIN 125 MCG PO TABS: 125.0000 ug | ORAL_TABLET | Freq: Every day | ORAL | Status: DC

## 2011-04-28 ENCOUNTER — Ambulatory Visit: Payer: Medicare Other | Admitting: Cardiovascular Disease

## 2011-05-11 DIAGNOSIS — I10 Essential (primary) hypertension: Secondary | ICD-10-CM | POA: Diagnosis not present

## 2011-05-11 DIAGNOSIS — J329 Chronic sinusitis, unspecified: Secondary | ICD-10-CM | POA: Diagnosis not present

## 2011-05-11 DIAGNOSIS — IMO0001 Reserved for inherently not codable concepts without codable children: Secondary | ICD-10-CM | POA: Diagnosis not present

## 2011-05-11 DIAGNOSIS — Z7901 Long term (current) use of anticoagulants: Secondary | ICD-10-CM | POA: Diagnosis not present

## 2011-05-11 DIAGNOSIS — Z79899 Other long term (current) drug therapy: Secondary | ICD-10-CM | POA: Diagnosis not present

## 2011-05-18 ENCOUNTER — Ambulatory Visit: Payer: Medicare Other | Admitting: Cardiovascular Disease

## 2011-05-29 ENCOUNTER — Encounter (HOSPITAL_COMMUNITY): Payer: Self-pay | Admitting: *Deleted

## 2011-05-29 ENCOUNTER — Emergency Department (INDEPENDENT_AMBULATORY_CARE_PROVIDER_SITE_OTHER)
Admission: EM | Admit: 2011-05-29 | Discharge: 2011-05-29 | Disposition: A | Discharge status: Home / Self Care | Payer: Medicare Other | Source: Home / Self Care | Attending: Emergency Medicine | Admitting: Emergency Medicine

## 2011-05-29 DIAGNOSIS — J329 Chronic sinusitis, unspecified: Secondary | ICD-10-CM

## 2011-05-29 MED ORDER — ACETAMINOPHEN-CODEINE #3 300-30 MG PO TABS: 1.0000 | ORAL_TABLET | Freq: Four times a day (QID) | ORAL | Status: AC | PRN

## 2011-05-29 MED ORDER — AMOXICILLIN 500 MG PO CAPS: 500.0000 mg | ORAL_CAPSULE | Freq: Three times a day (TID) | ORAL | Status: AC

## 2011-05-29 NOTE — ED Provider Notes (Signed)
History     CSN: [id number]  Arrival date & time 05/29/11  1116   First MD Initiated Contact with Patient 05/29/11 1119      Chief Complaint  Patient presents with  . Facial Pain  . Nasal Congestion    (Consider location/radiation/quality/duration/timing/severity/associated sxs/prior treatment) HPI Comments: Sinus congestion and pressure for almost 3 weeks, dripping its getting worse from my throat, its stated with a cough and phlegm's, it got to my sinuses, the cough its  A bit better. NO SOB  The history is provided by the patient.    Past Medical History  Diagnosis Date  . Cardiomyopathy     non-ischemic 04/2008 EF 45% cardiac MRI no scar  . Hypertension   . Hypercholesterolemia   . CAD (coronary artery disease)   . CHF (congestive heart failure)   . RLS (restless legs syndrome)   . Dyspnea on exertion     chronic  . Diabetes mellitus     type 2  . Sinusitis     Past Surgical History  Procedure Date  . Carotid angiogram     2006  . Gallbladder surgery   . Esophagogastroduodenoscopy     with foreign body removal  . Nasal sinus surgery     x2    Family History  Problem Relation Age of Onset  . Coronary artery disease      family history    History  Substance Use Topics  . Smoking status: Current Everyday Smoker    Types: Cigarettes  . Smokeless tobacco: Not on file  . Alcohol Use: No    OB History    Grav Para Term Preterm Abortions TAB SAB Ect Mult Living                  Review of Systems  Constitutional: Positive for fever and appetite change. Negative for fatigue.  HENT: Positive for ear pain, congestion, rhinorrhea and postnasal drip. Negative for neck pain and neck stiffness.   Respiratory: Negative for cough and shortness of breath.   Cardiovascular: Negative.     Allergies  Review of patient's allergies indicates no known allergies.  Home Medications   Current Outpatient Rx  Name Route Sig Dispense Refill  . ATORVASTATIN CALCIUM  10 MG PO TABS Oral Take 10 mg by mouth. Take 1 tablet on M-W- F     . BENAZEPRIL HCL 40 MG PO TABS Oral Take 40 mg by mouth daily.      Marland Kitchen CARVEDILOL 3.125 MG PO TABS Oral Take 1 tablet (3.125 mg total) by mouth 2 (two) times daily with a meal. 60 tablet 6  . DIGOXIN 0.125 MG PO TABS Oral Take 1 tablet (125 mcg total) by mouth daily. 30 tablet 6  . FUROSEMIDE 20 MG PO TABS Oral Take 20 mg by mouth daily.      Marland Kitchen GLIMEPIRIDE 2 MG PO TABS Oral Take 2 mg by mouth daily before breakfast.      . ONE-DAILY MULTI VITAMINS PO TABS Oral Take 1 tablet by mouth daily.      . WARFARIN SODIUM 4 MG PO TABS  4 mg. As directed     . ACETAMINOPHEN-CODEINE #3 300-30 MG PO TABS Oral Take 1-2 tablets by mouth every 6 (six) hours as needed for pain. 15 tablet 0  . AMOXICILLIN 500 MG PO CAPS Oral Take 1 capsule (500 mg total) by mouth 3 (three) times daily. 30 capsule 0    BP 175/89  Pulse 81  Temp(Src) 97.8 F (36.6 C) (Oral)  Resp 20  SpO2 97%  Physical Exam  Nursing note and vitals reviewed. Constitutional: She appears well-developed and well-nourished. She appears distressed.  HENT:  Head: Normocephalic.  Right Ear: No decreased hearing is noted.  Left Ear: No decreased hearing is noted.  Mouth/Throat: Uvula is midline. No oropharyngeal exudate.  Eyes: Conjunctivae are normal. Right eye exhibits no discharge. Left eye exhibits no discharge.  Neck: No JVD present.  Cardiovascular: Exam reveals no friction rub.   No murmur heard. Pulmonary/Chest: No respiratory distress. She has no wheezes. She has no rales. She exhibits no tenderness.  Abdominal: She exhibits no distension.  Lymphadenopathy:    She has no cervical adenopathy.  Skin: No erythema.    ED Course  Procedures (including critical care time)  Labs Reviewed - No data to display No results found.   1. Sinusitis       MDM  Sinusitis predominantly frontal wih co-exsitant URI sxs and post-nasal dripping for greater than 2  weeks        Jimmie Molly, MD 05/29/11 9147

## 2011-05-29 NOTE — ED Notes (Addendum)
C/O HA, facial pain, nasal congestion, postnasal drip since 1/2.  Now has productive cough and has felt feverish.  Has been taking Tyl and using decongestant nasal spray.  Denies n/v.

## 2011-05-31 ENCOUNTER — Ambulatory Visit: Payer: Medicare Other | Admitting: Cardiovascular Disease

## 2011-06-02 ENCOUNTER — Telehealth: Payer: Self-pay | Admitting: Cardiovascular Disease

## 2011-06-02 NOTE — Telephone Encounter (Signed)
New Msg: Pt calling stating that she needs refill of amlodipine 5 mg called into [facility]. Pt is now out of medication. Please call this RX in asap.

## 2011-06-02 NOTE — Telephone Encounter (Signed)
Should take ACE not amlodipine.

## 2011-06-02 NOTE — Telephone Encounter (Signed)
REVIEWED PT'S MED LIST ACCORDING TO LAST OFFICE NOTE AMLODIPINE WAS DISCONTINUED.  PT STATES   IS NOT TAKING BENAZEPRIL  BUT IS TAKING AMLODIPINE  PT AWARE WILL FORWARD TO DR [name]  FOR REIVEW./CY

## 2011-06-03 MED ORDER — BENAZEPRIL HCL 40 MG PO TABS: 40.0000 mg | ORAL_TABLET | Freq: Every day | ORAL | Status: DC

## 2011-06-03 NOTE — Telephone Encounter (Signed)
Fu call Patient calling back again 

## 2011-06-03 NOTE — Telephone Encounter (Signed)
PT AWARE  NEEDS TO TAKE BENAZEPRIL 40 MG EVERY DAY NOT AMLODIPINE  BENAZEPRIL SENT VIA EP[facility] TO  [facility] .Zack Seal

## 2011-06-20 DIAGNOSIS — E1129 Type 2 diabetes mellitus with other diabetic kidney complication: Secondary | ICD-10-CM | POA: Diagnosis not present

## 2011-06-20 DIAGNOSIS — E1165 Type 2 diabetes mellitus with hyperglycemia: Secondary | ICD-10-CM | POA: Diagnosis not present

## 2011-06-20 DIAGNOSIS — Z7901 Long term (current) use of anticoagulants: Secondary | ICD-10-CM | POA: Diagnosis not present

## 2011-06-20 DIAGNOSIS — N182 Chronic kidney disease, stage 2 (mild): Secondary | ICD-10-CM | POA: Diagnosis not present

## 2011-06-20 DIAGNOSIS — Z79899 Other long term (current) drug therapy: Secondary | ICD-10-CM | POA: Diagnosis not present

## 2011-06-22 ENCOUNTER — Ambulatory Visit: Payer: Medicare Other | Admitting: Cardiovascular Disease

## 2011-08-03 ENCOUNTER — Encounter: Payer: Self-pay | Admitting: Cardiovascular Disease

## 2011-08-03 ENCOUNTER — Ambulatory Visit (INDEPENDENT_AMBULATORY_CARE_PROVIDER_SITE_OTHER): Payer: Medicare Other | Admitting: Cardiovascular Disease

## 2011-08-03 DIAGNOSIS — R0989 Other specified symptoms and signs involving the circulatory and respiratory systems: Secondary | ICD-10-CM

## 2011-08-03 DIAGNOSIS — I428 Other cardiomyopathies: Secondary | ICD-10-CM | POA: Diagnosis not present

## 2011-08-03 DIAGNOSIS — R0609 Other forms of dyspnea: Secondary | ICD-10-CM | POA: Diagnosis not present

## 2011-08-03 DIAGNOSIS — R0600 Dyspnea, unspecified: Secondary | ICD-10-CM

## 2011-08-03 MED ORDER — ASPIRIN EC 81 MG PO TBEC: 81.0000 mg | DELAYED_RELEASE_TABLET | Freq: Every day | ORAL | Status: DC

## 2011-08-03 NOTE — Assessment & Plan Note (Signed)
Cholesterol is at goal.  Continue current dose of statin and diet Rx.  No myalgias or side effects.  F/U  LFT's in 6 months. Lab Results  Component Value Date   LDLCALC  Value: 73        Total Cholesterol/HDL:CHD Risk Coronary Heart Disease Risk Table                     Men   Women  1/2 Average Risk   3.4   3.3  Average Risk       5.0   4.4  2 X Average Risk   9.6   7.1  3 X Average Risk  23.4   11.0        Use the calculated Patient Ratio above and the CHD Risk Table to determine the patient's CHD Risk.        ATP III CLASSIFICATION (LDL):  <100     mg/dL   Optimal  100-129  mg/dL   Near or Above                    Optimal  130-159  mg/dL   Borderline  160-189  mg/dL   High  >190     mg/dL   Very High 05/01/2010             

## 2011-08-03 NOTE — Progress Notes (Signed)
[name] was recently hospitalzied for UTI, COPD exacerbation and diatolic CHF. BP seems suboptimaly controlled despite 3 drug Rx. She has a history of DCM with no CAD EF 45% by MRI in 04/2008. She denies SSCP, palpitations . She has had no edema and continues to be very thin. she has been compliant with her meds. Her INR's have been Rx with no bleeding diathesis She just had a duplex at Dr fields office and her RICA is stable with 60-79% stenosis   Has had more dyspnea  No PND or othopnea no cough or fever. No resting dyspnea but activity more limited now.    Reviewed her chart and I am not sure she still needs to be on coumadin. She has had distant PAF and mild CHF. Given her age, frailty and low body weight I think we should stop it and see how she does  She was hesitant last time because her coumadin is free but I told her it is much stronger than ASA and I gave her 6 months of free baby aspirin from our drug cabinet  ROS: Denies fever, malais, weight loss, blurry vision, decreased visual acuity, cough, sputum, SOB, hemoptysis, pleuritic pain, palpitaitons, heartburn, abdominal pain, melena, lower extremity edema, claudication, or rash.  All other systems reviewed and negative  General: Affect appropriate Frail black female HEENT: normal Neck supple with no adenopathy JVP normal no bruits no thyromegaly Lungs clear with no wheezing and good diaphragmatic motion Heart:  S1/S2 no murmur, no rub, gallop or click PMI enlarged Abdomen: benighn, BS positve, no tenderness, no AAA no bruit.  No HSM or HJR Distal pulses intact with no bruits No edema Neuro non-focal Skin warm and dry No muscular weakness   Current Outpatient Prescriptions  Medication Sig Dispense Refill  . atorvastatin (LIPITOR) 10 MG tablet Take 1 tablet on M-W- F      . benazepril (LOTENSIN) 40 MG tablet Take 1 tablet (40 mg total) by mouth daily.  30 tablet  11  . carvedilol (COREG) 3.125 MG tablet Take 1 tablet (3.125 mg  total) by mouth 2 (two) times daily with a meal.  60 tablet  6  . digoxin (LANOXIN) 0.125 MG tablet Take 1 tablet (125 mcg total) by mouth daily.  30 tablet  6  . furosemide (LASIX) 20 MG tablet Take 20 mg by mouth daily.        Marland Kitchen glimepiride (AMARYL) 2 MG tablet Take 2 mg by mouth daily before breakfast.        . Multiple Vitamin (MULTIVITAMIN) tablet Take 1 tablet by mouth daily.        Marland Kitchen warfarin (COUMADIN) 4 MG tablet 4 mg. As directed         Allergies  Review of patient's allergies indicates no known allergies.  Electrocardiogram:  Assessment and Plan

## 2011-08-03 NOTE — Assessment & Plan Note (Signed)
Discussed low carb diet.  Target hemoglobin A1c is 6.5 or less.  Continue current medications.  

## 2011-08-03 NOTE — Patient Instructions (Signed)
Your physician wants you to follow-up in: 6 MONTHS WITH DR Haywood Filler will receive a reminder letter in the mail two months in advance. If you don't receive a letter, please call our office to schedule the follow-up appointment. Your physician has recommended you make the following change in your medication: STOP COUMADIN START ASPIRIN 81 MG 1 EVERY DAY Your physician has requested that you have a cardiac MRI. Cardiac MRI uses a computer to create images of your heart as its beating, producing both still and moving pictures of your heart and major blood vessels. For further information please visit [email or web address]. Please follow the instruction sheet given to you today for more information. VIABILITY  DYSPNEA AND CARDIOMYOPATHY  08-12-11

## 2011-08-03 NOTE — Assessment & Plan Note (Signed)
Maint NSR stop coumadin start ASA

## 2011-08-03 NOTE — Assessment & Plan Note (Signed)
Increased dyspnea.  PMI enlarged.  F/U cardiac MRI to reassess EF  Last done 2010 45%

## 2011-08-03 NOTE — Assessment & Plan Note (Signed)
Well controlled.  Continue current medications and low sodium Dash type diet.    

## 2011-08-05 ENCOUNTER — Encounter: Payer: Self-pay | Admitting: Cardiovascular Disease

## 2011-08-08 ENCOUNTER — Other Ambulatory Visit: Payer: Medicare Other

## 2011-08-08 LAB — BASIC METABOLIC PANEL WITH GFR
BUN: 14 mg/dL (ref 6–23)
CO2: 31 meq/L (ref 19–32)
Calcium: 10 mg/dL (ref 8.4–10.5)
Chloride: 94 meq/L — ABNORMAL LOW (ref 96–112)
Creatinine, Ser: 0.8 mg/dL (ref 0.4–1.2)
GFR: 83.95 mL/min (ref >60.00)
Glucose, Bld: 181 mg/dL — ABNORMAL HIGH (ref 70–99)
Potassium: 4.5 meq/L (ref 3.5–5.1)
Sodium: 137 meq/L (ref 135–145)

## 2011-08-12 ENCOUNTER — Ambulatory Visit (HOSPITAL_COMMUNITY)
Admission: RE | Admit: 2011-08-12 | Discharge: 2011-08-12 | Disposition: A | Discharge status: Home / Self Care | Payer: Medicare Other | Source: Ambulatory Visit | Attending: Cardiovascular Disease | Admitting: Cardiovascular Disease

## 2011-08-12 DIAGNOSIS — I428 Other cardiomyopathies: Secondary | ICD-10-CM | POA: Insufficient documentation

## 2011-08-12 DIAGNOSIS — R6889 Other general symptoms and signs: Secondary | ICD-10-CM | POA: Insufficient documentation

## 2011-08-12 DIAGNOSIS — R0600 Dyspnea, unspecified: Secondary | ICD-10-CM

## 2011-08-12 MED ORDER — GADOBENATE DIMEGLUMINE 529 MG/ML IV SOLN
15.0000 mL | Freq: Once | INTRAVENOUS | Status: AC
  Administered 2011-08-12: 15 mL via INTRAVENOUS

## 2011-09-08 DIAGNOSIS — E1129 Type 2 diabetes mellitus with other diabetic kidney complication: Secondary | ICD-10-CM | POA: Diagnosis not present

## 2011-09-08 DIAGNOSIS — Z79899 Other long term (current) drug therapy: Secondary | ICD-10-CM | POA: Diagnosis not present

## 2011-09-08 DIAGNOSIS — I129 Hypertensive chronic kidney disease with stage 1 through stage 4 chronic kidney disease, or unspecified chronic kidney disease: Secondary | ICD-10-CM | POA: Diagnosis not present

## 2011-09-08 DIAGNOSIS — E8881 Metabolic syndrome: Secondary | ICD-10-CM | POA: Diagnosis not present

## 2011-09-08 DIAGNOSIS — E7800 Pure hypercholesterolemia, unspecified: Secondary | ICD-10-CM | POA: Diagnosis not present

## 2011-09-08 DIAGNOSIS — I10 Essential (primary) hypertension: Secondary | ICD-10-CM | POA: Diagnosis not present

## 2011-09-08 DIAGNOSIS — N390 Urinary tract infection, site not specified: Secondary | ICD-10-CM | POA: Diagnosis not present

## 2011-09-08 DIAGNOSIS — R0602 Shortness of breath: Secondary | ICD-10-CM | POA: Diagnosis not present

## 2011-09-08 DIAGNOSIS — E1165 Type 2 diabetes mellitus with hyperglycemia: Secondary | ICD-10-CM | POA: Diagnosis not present

## 2011-09-08 DIAGNOSIS — N182 Chronic kidney disease, stage 2 (mild): Secondary | ICD-10-CM | POA: Diagnosis not present

## 2011-09-08 DIAGNOSIS — R209 Unspecified disturbances of skin sensation: Secondary | ICD-10-CM | POA: Diagnosis not present

## 2011-11-01 DIAGNOSIS — H35039 Hypertensive retinopathy, unspecified eye: Secondary | ICD-10-CM | POA: Diagnosis not present

## 2011-11-03 ENCOUNTER — Other Ambulatory Visit: Payer: Self-pay | Admitting: *Deleted

## 2011-11-03 MED ORDER — DIGOXIN 125 MCG PO TABS: 125.0000 ug | ORAL_TABLET | Freq: Every day | ORAL | Status: DC

## 2011-11-03 NOTE — Telephone Encounter (Signed)
Refilled digoxin 

## 2011-11-08 ENCOUNTER — Encounter (HOSPITAL_COMMUNITY): Payer: Self-pay | Admitting: *Deleted

## 2011-11-08 ENCOUNTER — Other Ambulatory Visit: Payer: Self-pay

## 2011-11-08 ENCOUNTER — Emergency Department (HOSPITAL_COMMUNITY): Payer: Medicare Other

## 2011-11-08 ENCOUNTER — Inpatient Hospital Stay (HOSPITAL_COMMUNITY)
Admission: EM | Admit: 2011-11-08 | Discharge: 2011-11-10 | DRG: 305 | Disposition: A | Discharge status: Home / Self Care | Payer: Medicare Other | Attending: Family Medicine | Admitting: Family Medicine

## 2011-11-08 DIAGNOSIS — I5042 Chronic combined systolic (congestive) and diastolic (congestive) heart failure: Secondary | ICD-10-CM | POA: Diagnosis present

## 2011-11-08 DIAGNOSIS — R197 Diarrhea, unspecified: Secondary | ICD-10-CM | POA: Diagnosis present

## 2011-11-08 DIAGNOSIS — R300 Dysuria: Secondary | ICD-10-CM

## 2011-11-08 DIAGNOSIS — J329 Chronic sinusitis, unspecified: Secondary | ICD-10-CM | POA: Diagnosis present

## 2011-11-08 DIAGNOSIS — R0602 Shortness of breath: Secondary | ICD-10-CM | POA: Diagnosis not present

## 2011-11-08 DIAGNOSIS — B349 Viral infection, unspecified: Secondary | ICD-10-CM

## 2011-11-08 DIAGNOSIS — E119 Type 2 diabetes mellitus without complications: Secondary | ICD-10-CM | POA: Diagnosis not present

## 2011-11-08 DIAGNOSIS — E7800 Pure hypercholesterolemia, unspecified: Secondary | ICD-10-CM | POA: Diagnosis present

## 2011-11-08 DIAGNOSIS — I4891 Unspecified atrial fibrillation: Secondary | ICD-10-CM

## 2011-11-08 DIAGNOSIS — E871 Hypo-osmolality and hyponatremia: Secondary | ICD-10-CM | POA: Diagnosis present

## 2011-11-08 DIAGNOSIS — I10 Essential (primary) hypertension: Principal | ICD-10-CM

## 2011-11-08 DIAGNOSIS — I509 Heart failure, unspecified: Secondary | ICD-10-CM | POA: Diagnosis not present

## 2011-11-08 DIAGNOSIS — I2510 Atherosclerotic heart disease of native coronary artery without angina pectoris: Secondary | ICD-10-CM | POA: Diagnosis present

## 2011-11-08 DIAGNOSIS — R0609 Other forms of dyspnea: Secondary | ICD-10-CM

## 2011-11-08 DIAGNOSIS — I428 Other cardiomyopathies: Secondary | ICD-10-CM | POA: Diagnosis present

## 2011-11-08 DIAGNOSIS — Z8673 Personal history of transient ischemic attack (TIA), and cerebral infarction without residual deficits: Secondary | ICD-10-CM | POA: Diagnosis not present

## 2011-11-08 DIAGNOSIS — J441 Chronic obstructive pulmonary disease with (acute) exacerbation: Secondary | ICD-10-CM | POA: Diagnosis present

## 2011-11-08 DIAGNOSIS — R0989 Other specified symptoms and signs involving the circulatory and respiratory systems: Secondary | ICD-10-CM | POA: Diagnosis not present

## 2011-11-08 DIAGNOSIS — J438 Other emphysema: Secondary | ICD-10-CM | POA: Diagnosis not present

## 2011-11-08 DIAGNOSIS — F17200 Nicotine dependence, unspecified, uncomplicated: Secondary | ICD-10-CM | POA: Diagnosis present

## 2011-11-08 DIAGNOSIS — Z8669 Personal history of other diseases of the nervous system and sense organs: Secondary | ICD-10-CM

## 2011-11-08 DIAGNOSIS — R0600 Dyspnea, unspecified: Secondary | ICD-10-CM

## 2011-11-08 DIAGNOSIS — IMO0001 Reserved for inherently not codable concepts without codable children: Secondary | ICD-10-CM | POA: Diagnosis present

## 2011-11-08 DIAGNOSIS — R002 Palpitations: Secondary | ICD-10-CM

## 2011-11-08 DIAGNOSIS — I6529 Occlusion and stenosis of unspecified carotid artery: Secondary | ICD-10-CM

## 2011-11-08 DIAGNOSIS — B9789 Other viral agents as the cause of diseases classified elsewhere: Secondary | ICD-10-CM | POA: Diagnosis not present

## 2011-11-08 DIAGNOSIS — Z8679 Personal history of other diseases of the circulatory system: Secondary | ICD-10-CM

## 2011-11-08 HISTORY — DX: Headache: R51

## 2011-11-08 HISTORY — DX: Headache, unspecified: R51.9

## 2011-11-08 HISTORY — DX: Cerebral infarction, unspecified: I63.9

## 2011-11-08 HISTORY — DX: Chronic sinusitis, unspecified: J32.9

## 2011-11-08 HISTORY — DX: Type 2 diabetes mellitus without complications: E11.9

## 2011-11-08 HISTORY — DX: Depression, unspecified: F32.A

## 2011-11-08 HISTORY — DX: Major depressive disorder, single episode, unspecified: F32.9

## 2011-11-08 LAB — URINALYSIS, ROUTINE W REFLEX MICROSCOPIC
Bilirubin Urine: NEGATIVE
Ketones, ur: NEGATIVE mg/dL
Nitrite: NEGATIVE
Specific Gravity, Urine: 1.008 (ref 1.005–1.030)
Urobilinogen, UA: 0.2 mg/dL (ref 0.0–1.0)

## 2011-11-08 LAB — CBC WITH DIFFERENTIAL/PLATELET
Basophils Relative: 1 % (ref 0–1)
Eosinophils Absolute: 0.2 10*3/uL (ref 0.0–0.7)
Eosinophils Relative: 2 % (ref 0–5)
Monocytes Absolute: 0.6 10*3/uL (ref 0.1–1.0)
Monocytes Relative: 6 % (ref 3–12)
Neutro Abs: 5.5 10*3/uL (ref 1.7–7.7)
Platelets: 166 10*3/uL (ref 150–400)
RBC: 4.89 MIL/uL (ref 3.87–5.11)
RDW: 13.8 % (ref 11.5–15.5)
WBC: 9.2 10*3/uL (ref 4.0–10.5)

## 2011-11-08 LAB — COMPREHENSIVE METABOLIC PANEL
Albumin: 4.5 g/dL (ref 3.5–5.2)
BUN: 8 mg/dL (ref 6–23)
Chloride: 94 mEq/L — ABNORMAL LOW (ref 96–112)
Creatinine, Ser: 0.84 mg/dL (ref 0.50–1.10)
Total Bilirubin: 0.5 mg/dL (ref 0.3–1.2)

## 2011-11-08 LAB — CBC
HCT: 46.7 % — ABNORMAL HIGH (ref 36.0–46.0)
Hemoglobin: 15.6 g/dL — ABNORMAL HIGH (ref 12.0–15.0)
MCH: 32 pg (ref 26.0–34.0)
MCHC: 33.4 g/dL (ref 30.0–36.0)

## 2011-11-08 LAB — GLUCOSE, CAPILLARY: Glucose-Capillary: 168 mg/dL — ABNORMAL HIGH (ref 70–99)

## 2011-11-08 LAB — PROTIME-INR
INR: 1.01 (ref 0.00–1.49)
Prothrombin Time: 13.5 seconds (ref 11.6–15.2)

## 2011-11-08 LAB — CARDIAC PANEL(CRET KIN+CKTOT+MB+TROPI)
CK, MB: 4.3 ng/mL — ABNORMAL HIGH (ref 0.3–4.0)
Relative Index: 2.7 — ABNORMAL HIGH (ref 0.0–2.5)
Troponin I: <0.3 ng/mL (ref <0.30)

## 2011-11-08 LAB — PRO B NATRIURETIC PEPTIDE: Pro B Natriuretic peptide (BNP): 220.9 pg/mL (ref 0–450)

## 2011-11-08 MED ORDER — GLIMEPIRIDE 2 MG PO TABS
2.0000 mg | ORAL_TABLET | Freq: Every day | ORAL | Status: DC
  Administered 2011-11-09 – 2011-11-10 (×2): 2 mg via ORAL
  Filled 2011-11-08 (×3): qty 1

## 2011-11-08 MED ORDER — INSULIN ASPART 100 UNIT/ML ~~LOC~~ SOLN: 0.0000 [IU] | Freq: Every day | SUBCUTANEOUS | Status: DC

## 2011-11-08 MED ORDER — ASPIRIN EC 81 MG PO TBEC
81.0000 mg | DELAYED_RELEASE_TABLET | Freq: Every day | ORAL | Status: DC
  Administered 2011-11-09 – 2011-11-10 (×2): 81 mg via ORAL
  Filled 2011-11-08 (×2): qty 1

## 2011-11-08 MED ORDER — FUROSEMIDE 20 MG PO TABS
20.0000 mg | ORAL_TABLET | Freq: Every day | ORAL | Status: DC
  Administered 2011-11-09 – 2011-11-10 (×2): 20 mg via ORAL
  Filled 2011-11-08 (×2): qty 1

## 2011-11-08 MED ORDER — ACETAMINOPHEN 650 MG RE SUPP: 650.0000 mg | Freq: Four times a day (QID) | RECTAL | Status: DC | PRN

## 2011-11-08 MED ORDER — AZITHROMYCIN 500 MG IV SOLR: 500.0000 mg | INTRAVENOUS | Status: DC

## 2011-11-08 MED ORDER — ADULT MULTIVITAMIN W/MINERALS CH
1.0000 | ORAL_TABLET | Freq: Every day | ORAL | Status: DC
  Administered 2011-11-09 – 2011-11-10 (×2): 1 via ORAL
  Filled 2011-11-08 (×2): qty 1

## 2011-11-08 MED ORDER — IPRATROPIUM BROMIDE 0.02 % IN SOLN
0.5000 mg | Freq: Once | RESPIRATORY_TRACT | Status: AC
  Administered 2011-11-08: 0.5 mg via RESPIRATORY_TRACT
  Filled 2011-11-08: qty 2.5

## 2011-11-08 MED ORDER — ENOXAPARIN SODIUM 40 MG/0.4ML ~~LOC~~ SOLN
40.0000 mg | SUBCUTANEOUS | Status: DC
  Administered 2011-11-08 – 2011-11-09 (×2): 40 mg via SUBCUTANEOUS
  Filled 2011-11-08 (×3): qty 0.4

## 2011-11-08 MED ORDER — SODIUM CHLORIDE 0.9 % IV SOLN: 250.0000 mL | INTRAVENOUS | Status: DC | PRN

## 2011-11-08 MED ORDER — SODIUM CHLORIDE 0.9 % IV SOLN: INTRAVENOUS | Status: DC

## 2011-11-08 MED ORDER — ALBUTEROL SULFATE (5 MG/ML) 0.5% IN NEBU
2.5000 mg | INHALATION_SOLUTION | Freq: Once | RESPIRATORY_TRACT | Status: AC
  Administered 2011-11-08: 2.5 mg via RESPIRATORY_TRACT
  Filled 2011-11-08: qty 0.5

## 2011-11-08 MED ORDER — AMLODIPINE BESYLATE 5 MG PO TABS
5.0000 mg | ORAL_TABLET | Freq: Every day | ORAL | Status: DC
  Filled 2011-11-08: qty 1

## 2011-11-08 MED ORDER — SODIUM CHLORIDE 0.9 % IJ SOLN
3.0000 mL | Freq: Two times a day (BID) | INTRAMUSCULAR | Status: DC
  Administered 2011-11-08 – 2011-11-10 (×4): 3 mL via INTRAVENOUS

## 2011-11-08 MED ORDER — SODIUM CHLORIDE 0.9 % IV SOLN: INTRAVENOUS | Status: AC

## 2011-11-08 MED ORDER — ATORVASTATIN CALCIUM 10 MG PO TABS
10.0000 mg | ORAL_TABLET | ORAL | Status: DC
  Administered 2011-11-09: 10 mg via ORAL
  Filled 2011-11-08: qty 1

## 2011-11-08 MED ORDER — CARVEDILOL 3.125 MG PO TABS
3.1250 mg | ORAL_TABLET | Freq: Two times a day (BID) | ORAL | Status: DC
  Administered 2011-11-08 – 2011-11-10 (×4): 3.125 mg via ORAL
  Filled 2011-11-08 (×6): qty 1

## 2011-11-08 MED ORDER — HYDRALAZINE HCL 20 MG/ML IJ SOLN
10.0000 mg | Freq: Three times a day (TID) | INTRAMUSCULAR | Status: DC | PRN
  Administered 2011-11-08: 10 mg via INTRAVENOUS
  Filled 2011-11-08: qty 0.5

## 2011-11-08 MED ORDER — DIGOXIN 125 MCG PO TABS
125.0000 ug | ORAL_TABLET | Freq: Every day | ORAL | Status: DC
  Administered 2011-11-09 – 2011-11-10 (×2): 125 ug via ORAL
  Filled 2011-11-08 (×2): qty 1

## 2011-11-08 MED ORDER — SODIUM CHLORIDE 0.9 % IJ SOLN: 3.0000 mL | INTRAMUSCULAR | Status: DC | PRN

## 2011-11-08 MED ORDER — BENAZEPRIL HCL 40 MG PO TABS
40.0000 mg | ORAL_TABLET | Freq: Every day | ORAL | Status: DC
  Administered 2011-11-09 – 2011-11-10 (×2): 40 mg via ORAL
  Filled 2011-11-08 (×2): qty 1

## 2011-11-08 MED ORDER — ACETAMINOPHEN 325 MG PO TABS
650.0000 mg | ORAL_TABLET | Freq: Four times a day (QID) | ORAL | Status: DC | PRN
  Administered 2011-11-09: 650 mg via ORAL
  Filled 2011-11-08: qty 2

## 2011-11-08 MED ORDER — INSULIN ASPART 100 UNIT/ML ~~LOC~~ SOLN
0.0000 [IU] | Freq: Three times a day (TID) | SUBCUTANEOUS | Status: DC
  Administered 2011-11-09: 1 [IU] via SUBCUTANEOUS
  Administered 2011-11-09: 3 [IU] via SUBCUTANEOUS
  Administered 2011-11-09 – 2011-11-10 (×2): 2 [IU] via SUBCUTANEOUS

## 2011-11-08 MED ORDER — ONE-DAILY MULTI VITAMINS PO TABS: 1.0000 | ORAL_TABLET | Freq: Every day | ORAL | Status: DC

## 2011-11-08 NOTE — H&P (Signed)
Hospital Admission Note Date: 11/08/2011  Patient name: [name] Medical record number: [id number] Date of birth: Oct 26, 1929 Age: 78 y.o. Gender: female PCP: Alfredia Ferguson  Attending physician: Lawrence Marseilles Ashley Royalty, MD  Chief Complaint:Chills, Diarrhea x 1 day, SOB  History of Present Illness: Patient is a 78 year old female with a history of congestive heart failure, chronic tobacco use disorder, emphysema presents to the emergency room with a history of one day of chills and diarrhea which is self-limited. The patient also states that she was having some shortness of breath while going upstairs approximately one week ago. The patient denies any chest pain, pedal edema, orthopnea, PND, cough, wheezing, fevers, nausea or vomiting. She states that she has had chronic shortness of breath but subjectively appears to have been worse in the last week.  In the emergency room the patient was found to have a markedly elevated blood pressure at 212/102. She was seen by cardiology who felt that her shortness of breath was not related to her congestive heart failure and the patient has no clinical signs of an exacerbation of COPD.  A review of 2-D echocardiogram from 05/01/2010 shows the patient has an ejection fraction of 60-65% with grade 1 diastolic dysfunction. She also had mild to moderate increase in her pulmonary artery pressure which is consistent with some pulmonary hypertension. Scheduled Meds:   . albuterol  2.5 mg Nebulization Once  . amLODipine  5 mg Oral Daily  . azithromycin  500 mg Intravenous Q24H  . ipratropium  0.5 mg Nebulization Once   Continuous Infusions:   . DISCONTD: sodium chloride     PRN Meds:. Allergies: Review of patient's allergies indicates no known allergies. Past Medical History  Diagnosis Date  . Cardiomyopathy     non-ischemic 04/2008 EF 45% cardiac MRI no scar  . Hypertension   . Hypercholesterolemia   . CAD (coronary artery disease)     . CHF (congestive heart failure)   . Dyspnea on exertion     chronic  . Type II diabetes mellitus   . Sinus headache     "chronic"  . Stroke 1997    "mild"  . Chronic sinusitis   . Depression    Past Surgical History  Procedure Date  . Carotid angiogram     2006  . Esophagogastroduodenoscopy     with foreign body removal  . Nasal sinus surgery ~1974;~ 1976  . Tonsillectomy     "when I was a chld"   Family History  Problem Relation Age of Onset  . Coronary artery disease      family history   History   Social History  . Marital Status: Divorced    Spouse Name: N/A    Number of Children: 1  . Years of Education: N/A   Occupational History  .     Social History Main Topics  . Smoking status: Current Everyday Smoker -- 0.5 packs/day for 64 years    Types: Cigarettes  . Smokeless tobacco: Never Used  . Alcohol Use: No  . Drug Use: No  . Sexually Active: No   Other Topics Concern  . Not on file   Social History Narrative  . No narrative on file   Review of Systems: A comprehensive review of systems was negative except for as noted in the HPI. Physical Exam: No intake or output data in the 24 hours ending 11/08/11 1748 General: Alert, awake, oriented x3, in no acute distress.  HEENT: [place]oxie/AT PEERL, EOMI  Neck: Trachea midline,  no masses, no thyromegal,y no JVD, no carotid bruit OROPHARYNX:  Moist, No exudate/ erythema/lesions.  Heart: Regular rate and rhythm, without murmurs, rubs, gallops.  Lungs: Clear to auscultation, no wheezing or rhonchi noted. Abdomen: Soft, nontender, nondistended, positive bowel sounds, no masses no hepatosplenomegaly noted..  Neuro: No focal neurological deficits noted cranial nerves II through XII grossly intact. DTRs 2+ bilaterally upper and lower extremities. Strength normal in bilateral upper and lower extremities. Musculoskeletal: No warm swelling or erythema around joints, no spinal tenderness noted. Psychiatric: Patient alert  and oriented x3, good insight and cognition, good recent to remote recall. Lymph node survey: No cervical axillary or inguinal lymphadenopathy noted.  Lab results:  Basename 11/08/11 1029  NA 132*  K 4.6  CL 94*  CO2 26  GLUCOSE 261*  BUN 8  CREATININE 0.84  CALCIUM 9.8  MG --  PHOS --    Basename 11/08/11 1029  AST 21  ALT 15  ALKPHOS 54  BILITOT 0.5  PROT 8.5*  ALBUMIN 4.5   No results found for this basename: LIPASE:2,AMYLASE:2 in the last 72 hours  Basename 11/08/11 1029  WBC 9.2  NEUTROABS 5.5  HGB 15.6*  HCT 46.9*  MCV 95.9  PLT 166    Basename 11/08/11 1029  CKTOTAL 161  CKMB 4.3*  CKMBINDEX --  TROPONINI <0.30   No components found with this basename: POCBNP:3 No results found for this basename: DDIMER:2 in the last 72 hours No results found for this basename: HGBA1C:2 in the last 72 hours No results found for this basename: CHOL:2,HDL:2,LDLCALC:2,TRIG:2,CHOLHDL:2,LDLDIRECT:2 in the last 72 hours No results found for this basename: TSH,T4TOTAL,FREET3,T3FREE,THYROIDAB in the last 72 hours No results found for this basename: VITAMINB12:2,FOLATE:2,FERRITIN:2,TIBC:2,IRON:2,RETICCTPCT:2 in the last 72 hours Imaging results:  Dg Chest Portable 1 View  11/08/2011  *RADIOLOGY REPORT*  Clinical Data: Shortness of breath.  PORTABLE CHEST - 1 VIEW  Comparison: 04/30/2010  Findings: Lungs are hyperinflated with flattening of the diaphragm consistent with emphysema.  Heart size and pulmonary vascularity are normal.  No infiltrates.  No acute osseous abnormalities.  IMPRESSION: Severe emphysema.  No acute abnormalities.  Original Report Authenticated By: Gwynn Burly, M.D.   Other results: EKG: normal EKG, normal sinus rhythm, unchanged from previous tracings.   Patient Active Hospital Problem List: HTN (hypertension), malignant (11/08/2011)   Assessment: Patient's blood pressures markedly hypertensive. It is unclear as to whether or not she's been compliant  with her medications. I will admit the patient resume her medications and monitor blood pressures. And adjust her blood pressure medications as necessary    DIABETES MELLITUS, TYPE II (09/12/2008)   Assessment: We'll continue Amaryl and place on sliding-scale    PAROXYSMAL ATRIAL FIBRILLATION (11/18/2009)   Assessment: Patient's heart rate is well controlled at this time. We'll continue her current medications    CONGESTIVE HEART FAILURE, HX OF (09/12/2008)   Assessment: Patient has been seen by cardiology who feels that her heart failure is well compensated we'll continue her usual medication.   Viral illness (11/08/2011)   Assessment: Historically the patient presents with just a viral illness. At this time I see no indication for antibiotics on this patient I will stop the azithromycin and will assess patient while here being treated for her hypertension.      DVT prophylaxis with Lovenox.  Total time for evaluation assessment of this patient approximately 35 minutes   Malaka Ruffner A. 11/08/2011, 5:48 PM

## 2011-11-08 NOTE — ED Notes (Signed)
Consulting MD at bedside

## 2011-11-08 NOTE — Progress Notes (Signed)
Patient is not eligible for medication assistance because she has health insurance coverage.

## 2011-11-08 NOTE — ED Notes (Signed)
RT notified of need for neb.

## 2011-11-08 NOTE — ED Notes (Signed)
PT states diarrhea all day yesterday .  No resolved.  Pt has sob today and feels congested in sinus.  Pt is having tightness in her shoulders.  Fatigued

## 2011-11-08 NOTE — ED Provider Notes (Signed)
Discussed with the Hospitalist who requests bed request change from admission to observation. Call [facility] in [employer] office who will handle the change. Requested 4700 bed.   Rodena Medin, PA-C 11/08/11 [phone]

## 2011-11-08 NOTE — ED Notes (Addendum)
C/o worsening SOB especially with exertion x 1 week. Also c/o "sinus infection", nasal congestion, sinus pressure to left side face. Respirations even & unlabored, no distress.  Reports diarrhea yesterday x 6, none today. Denies n/v, abd pain.  States BP med at 0900

## 2011-11-08 NOTE — H&P (Addendum)
Patient ID: [name] MRN: [id number] DOB/AGE: 06-01-1929 78 y.o. Admit date: 11/08/2011  Primary Care Physician:Sanders, Felicita Gage Primary Cardiologist: Dr [name] Active Problems:  * No active hospital problems. *   HPI: Patient is an 78 year old female, current everyday smoker, with a history of COPD,HTN, CHF, atrial fibrillation, diabetes, and sinusitis that presents emergency department with chief complaint of worsening shortness of breath. She denies chest pain. Symptoms have been gradually worsening for about a week and patient reports dyspnea on exertion. She reports that normally she has shortness of breath on walking around her house but over the last 2 weeks, this has worsened. She also nonproductive cough, nasal congestion for the last 2-3 days. She reports to have associated headaches, sinus pains and chills and fever yesterday. She used a nasal spray yesterday without relief. She denies any chest pain, PND, orthopnea, leg swelling, hemoptysis, syncope, weakness, and inability to ambulate, abdominal pain. Patient's cardiologist is Dr. [name]. 2-D echocardiogram 04/30/10: 60-65% EF. Taken off coumadin on 08/03/2011 (was for Afib clot prevention) and RICA has stable 69-70% stenosis. Pt w poorly controlled HTN, regardless of 3 Rx.    Past Medical History  Diagnosis Date  . Cardiomyopathy     non-ischemic 04/2008 EF 45% cardiac MRI no scar  . Hypertension   . Hypercholesterolemia   . CAD (coronary artery disease)   . CHF (congestive heart failure)   . RLS (restless legs syndrome)   . Dyspnea on exertion     chronic  . Diabetes mellitus     type 2  . Sinusitis     Past Surgical History  Procedure Date  . Carotid angiogram     2006  . Gallbladder surgery   . Esophagogastroduodenoscopy     with foreign body removal  . Nasal sinus surgery     x2    Family History  Problem Relation Age of Onset  . Coronary artery disease      family history    History    Social History  . Marital Status: Divorced    Spouse Name: N/A    Number of Children: 1  . Years of Education: N/A   Occupational History  .     Social History Main Topics  . Smoking status: Current Everyday Smoker    Types: Cigarettes  . Smokeless tobacco: Not on file  . Alcohol Use: No  . Drug Use: No  . Sexually Active: Not on file   Other Topics Concern  . Not on file   Social History Narrative  . No narrative on file      General: She reports subjective fevers and chills yesterday but no night sweats. She has lost 4 pounds over the last one week - from 96 to 92. Eyes: no blurry vision, diplopia, or amaurosis ENT: no sore throat or hearing loss. Reports pain in her sinuses with congestion. She is sneezes repeatedly. No history of sick contacts.  Resp: She reports cough which some whitish sputum for the last several days but no chest pain as mentioned above . No wheezing, or hemoptysis GI: She reports that she had diarrhea yesterday about 6 times. There was no blood in stool. The stool was watery and copious. No abdominal pain, nausea or vomiting. Today she had a bowel movement with normal stools. GU: no dysuria, frequency, or hematuria Skin: no rash Neuro: She admits to a headaches which is constant and about 8/10 when worst. No numbness, tingling, or weakness  of extremities Musculoskeletal: no joint pain or swelling Heme: no bleeding, DVT, or easy bruising Endo: no polydipsia or polyuria  Physical Exam: Blood pressure 163/85, pulse 67, temperature 97.3 F (36.3 C), temperature source Oral, resp. rate 15, SpO2 100.00%.   Pt is alert and oriented, WD, WN, in no distress. HEENT: normal except for ? Nasal turbinates bilaterally. Ear exam is normal. Neck: JVP normal. Carotid upstrokes normal without bruits. No thyromegaly. Lungs: equal expansion, clear bilaterally. No wheezes. Poor air movement with prolonged expiratory phase CV: Apex is discrete and nondisplaced, RRR  with 2/6 systolic murmur at the LSB Abd: soft, NT, slightly increased BS, no bruit, no hepatosplenomegaly Back: no CVA tenderness Ext: no C/C/E        Femoral pulses 2+= without bruits        DP/PT pulses intact and = Skin: warm and dry without rash Neuro: CNII-XII intact             Strength intact = bilaterally  Labs:   Lab Results  Component Value Date   WBC 9.2 11/08/2011   HGB 15.6* 11/08/2011   HCT 46.9* 11/08/2011   MCV 95.9 11/08/2011   PLT 166 11/08/2011    Lab 11/08/11 1029  NA 132*  K 4.6  CL 94*  CO2 26  BUN 8  CREATININE 0.84  CALCIUM 9.8  PROT 8.5*  BILITOT 0.5  ALKPHOS 54  ALT 15  AST 21  GLUCOSE 261*   Lab Results  Component Value Date   CKTOTAL 161 11/08/2011   CKMB 4.3* 11/08/2011   TROPONINI <0.30 11/08/2011    Lab Results  Component Value Date   CHOL  Value: 171        ATP III CLASSIFICATION:  <200     mg/dL   Desirable  960-454  mg/dL   Borderline High  >=098    mg/dL   High        04/25/9145   Lab Results  Component Value Date   HDL 53 05/01/2010   Lab Results  Component Value Date   LDLCALC  Value: 73        Total Cholesterol/HDL:CHD Risk Coronary Heart Disease Risk Table                     Men   Women  1/2 Average Risk   3.4   3.3  Average Risk       5.0   4.4  2 X Average Risk   9.6   7.1  3 X Average Risk  23.4   11.0        Use the calculated Patient Ratio above and the CHD Risk Table to determine the patient's CHD Risk.        ATP III CLASSIFICATION (LDL):  <100     mg/dL   Optimal  829-562  mg/dL   Near or Above                    Optimal  130-159  mg/dL   Borderline  130-865  mg/dL   High  >784     mg/dL   Very High 10/01/6293   Lab Results  Component Value Date   TRIG 226* 05/01/2010   Lab Results  Component Value Date   CHOLHDL 3.2 05/01/2010   No results found for this basename: LDLDIRECT      Radiology: Dg Chest Portable 1 View  11/08/2011  *RADIOLOGY REPORT*  Clinical Data: Shortness of breath.  PORTABLE CHEST - 1 VIEW  Comparison:  04/30/2010  Findings: Lungs are hyperinflated with flattening of the diaphragm consistent with emphysema.  Heart size and pulmonary vascularity are normal.  No infiltrates.  No acute osseous abnormalities.  IMPRESSION: Severe emphysema.  No acute abnormalities.  Original Report Authenticated By: Gwynn Burly, M.D.   EKG: Sinus rhythm with rare PVC, LVH with repolarization abnormality with more pronounced lateral ST-T changes than 2012 tracing  ASSESSMENT AND PLAN:  1. Shortness of breath:  She has SOB without chest pain at all. This is a patient with COPD, CHF and poorly controlled HTN and who continues to smoke tobacco. Her symptoms tie up more with COPD exacerbation versus worsening CHF. I would favour COPD exacerbation in view of a likely upper respiratory tract infection as a cause of her COPD exacerbation. The infection is most likely a mild viral in nature given a normal WBC. She is a current smoker. She is not volume overloaded at this time but she could actually be volume depleted after a 6 episodes of diarrhea, weight loss and continued use of Furosemide during the diarrhea episode yesterday. She is saturating 100% at room air.  Plan (to discuss with Dr Excell Seltzer)  - Continue home diuretic Rx - Counseling for smoking cessation.  2. Sinusitis:  She complains of headache, sneezing and nasal congestion.  She pain above her eyes and also reports some fever and chills yesterday. She has sinus surgery in the past. I will discuss with Dr Excell Seltzer if a sinus CT scan would be benefit in her case. Addendum: defer to general medicine team.    3. Hypertension  Her BPs are high at 212/102 despite Benazepril, Carvedilol and Furosemide. She seems to be compliant with her treatment. I think her carvedilol (3.125mg  twice a day) can be increased gradually as tolerated. This will hopefully control her HTN and also had been shown to have a mortality benefit in CHF patients.    4. Diabetes:   Blood sugar  today was 261mg /dL. The last HB1AC 3 years ago when it was 7.0%Will need a HBA1C. To continue with Glimepiride.  Signed: Dow Adolph 11/08/2011, 2:29 PM Co-Sign MD  Patient seen, examined. Available data reviewed. Agree with findings, assessment, and plan as outlined by Dr Zada Girt. I have reviewed the available data, including imaging studies, labs, and outpatient office visits. The patient has moderate LV dysfunciton (LVEF 42% by cardiac MRI in April 2013) - secondary to nonischemic CM. However, she does not appear to have acute heart failure. She has no history of CAD and I suspect the lateral ST changes seen on EKG represent Repolarization changes secondary to LVH. Will cycle enzymes to be sure. Regarding her malignant HTN, I agree with increase of carvedilol to 6.25 mg BID. Also would amlodipine considering her severe HTN. Her overall clinical presentation does not appear to be a primary cardiac problem, so we will be happy to see the patient in consultation if she is admitted to the hospital.  Tonny Bollman, M.D. 11/08/2011 4:02 PM

## 2011-11-08 NOTE — ED Notes (Signed)
Cardiology at bedside upon pt arrival in [facility]

## 2011-11-08 NOTE — ED Provider Notes (Signed)
Medical screening examination/treatment/procedure(s) were conducted as a shared visit with non-physician practitioner(s) and myself.  I personally evaluated the patient during the encounter   Glynn Octave, MD 11/08/11 [phone]

## 2011-11-08 NOTE — ED Notes (Signed)
Pt has been evaluated by cardiology. They are not going to admit at this time. The hospitalist will be consulted

## 2011-11-08 NOTE — ED Notes (Signed)
Walked patient, pt did well 100 percent room air

## 2011-11-08 NOTE — Progress Notes (Addendum)
Latresa Gasser, is a 78 y.o. female,   MRN: [id number]  -  DOB - 1929-05-03  Outpatient Primary MD for the patient is Alfredia Ferguson  Chief Complaint:   Increasing SOB over the past two weeks.    Blood pressure 166/93, pulse 68, temperature 97.3 F (36.3 C), temperature source Oral, resp. rate 16, SpO2 99.00%.  Active Problems:  COPD exacerbation  DIABETES MELLITUS, TYPE II  PAROXYSMAL ATRIAL FIBRILLATION  CONGESTIVE HEART FAILURE, HX OF   Patient with PMH of heart failure, DM, cardiomyopathy, stroke presents with increased shortness of breath over the past two weeks.  She also describes fever, chills, and severe diarrhea yesterday.  She's had no further diarrhea today.  Initially she was felt to have CHF exacerbation, [facility] was consulted by the EDP.  They felt her symptoms were due to a COPD exacerbation.  Recent subjective fever and chills, and well has her xray results support COPD.  We will request an observation admission, start azithromycin, gentle IVF, and request a tele bed.    PT consult ordered for 7/17.    Algis Downs, PA-C [facility] Pager: [phone]

## 2011-11-08 NOTE — ED Provider Notes (Signed)
Medical screening examination/treatment/procedure(s) were conducted as a shared visit with non-physician practitioner(s) and myself.  I personally evaluated the patient during the encounter  Hx COPD, CHF, CAD with worsening SOB and DOE x 1 week. No CP. Lungs clear, no edema, no JVD. EKG nonischemic, but ST depressions laterally. Does not appear to have CHF exacerbation.  Glynn Octave, MD 11/08/11 [phone]

## 2011-11-08 NOTE — ED Provider Notes (Signed)
History     CSN: [id number]  Arrival date & time 11/08/11  1000   First MD Initiated Contact with Patient 11/08/11 1022      No chief complaint on file.   (Consider location/radiation/quality/duration/timing/severity/associated sxs/prior treatment) HPI Comments: Patient is an 78 year old female, current everyday smoker, with a history of COPD,HTN, CAD, CHF, atrial fibrillation, diabetes, and sinusitis that presents emergency department with chief complaint of worsening shortness of breath.  Symptoms have been gradually worsening for about a week and patient reports dyspnea on exertion.  Associated symptoms include a nonproductive cough, nasal congestion, and diarrhea yesterday.  She denies any chest pain, PND, orthopnea, leg swelling, hemoptysis, syncope, weakness, and inability to ambulate, abdominal pain, fever, night sweats, abdominal pain, vomiting, constipation, urinary symptoms, or chills.  Patient's cardiologist is Dr. [name].  2-D echocardiogram 04/30/10: 60-65% EF. Taken off coumadin on 08/03/2011 (was for Afib clot prevention) and RICA has stable 69-70% stenosis. Pt w poorly controlled HTN, regardless of 3 Rx.  FULL CODE   The history is provided by the patient.    Past Medical History  Diagnosis Date  . Cardiomyopathy     non-ischemic 04/2008 EF 45% cardiac MRI no scar  . Hypertension   . Hypercholesterolemia   . CAD (coronary artery disease)   . CHF (congestive heart failure)   . RLS (restless legs syndrome)   . Dyspnea on exertion     chronic  . Diabetes mellitus     type 2  . Sinusitis     Past Surgical History  Procedure Date  . Carotid angiogram     2006  . Gallbladder surgery   . Esophagogastroduodenoscopy     with foreign body removal  . Nasal sinus surgery     x2    Family History  Problem Relation Age of Onset  . Coronary artery disease      family history    History  Substance Use Topics  . Smoking status: Current Everyday Smoker    Types:  Cigarettes  . Smokeless tobacco: Not on file  . Alcohol Use: No    OB History    Grav Para Term Preterm Abortions TAB SAB Ect Mult Living                  Review of Systems  All other systems reviewed and are negative.    Allergies  Review of patient's allergies indicates no known allergies.  Home Medications   Current Outpatient Rx  Name Route Sig Dispense Refill  . ASPIRIN EC 81 MG PO TBEC Oral Take 81 mg by mouth daily.    . ATORVASTATIN CALCIUM 10 MG PO TABS Oral Take 10 mg by mouth every Monday, Wednesday, and Friday. Take 1 tablet on M-W- F    . BENAZEPRIL HCL 40 MG PO TABS Oral Take 40 mg by mouth daily.    Marland Kitchen CALCIUM PO Oral Take 1 tablet by mouth daily.     Marland Kitchen CARVEDILOL 3.125 MG PO TABS Oral Take 3.125 mg by mouth 2 (two) times daily with a meal.    . DIGOXIN 0.125 MG PO TABS Oral Take 125 mcg by mouth daily.    . FUROSEMIDE 20 MG PO TABS Oral Take 20 mg by mouth daily.      Marland Kitchen GLIMEPIRIDE 2 MG PO TABS Oral Take 2 mg by mouth daily before breakfast.      . ONE-DAILY MULTI VITAMINS PO TABS Oral Take 1 tablet by mouth daily.      Marland Kitchen  POTASSIUM PO Oral Take 1 tablet by mouth daily.      BP 212/102  Pulse 76  Temp 97.3 F (36.3 C) (Oral)  Resp 20  SpO2 100%  Physical Exam  Constitutional: She is oriented to person, place, and time. She appears well-developed and well-nourished. No distress.       Patient is alert, cachectic, and in no acute distress  HENT:  Head: Normocephalic and atraumatic.       Uvula midline, oropharynx clear and moist  Eyes: Conjunctivae and EOM are normal. Pupils are equal, round, and reactive to light.  Neck: Normal range of motion.       No stridor.  Neck supple with full range of motion.  Cardiovascular: Normal rate, regular rhythm, normal heart sounds and intact distal pulses.        RRR intact distal pulses  Pulmonary/Chest: Effort normal.       Patient not in respiratory distress, no accessory muscle use, nasal flaring.  Patient able  to speak in full sentences.  Airway intact.  Lung auscultation clear bilaterally  Abdominal: Soft. There is no tenderness.       No abdominal ttp, non pulsatile aorta   Musculoskeletal: Normal range of motion. She exhibits no edema and no tenderness.  Neurological: She is alert and oriented to person, place, and time.  Skin: Skin is warm and dry. No rash noted. She is not diaphoretic.  Psychiatric: She has a normal mood and affect. Her behavior is normal.    ED Course  Procedures (including critical care time)  Labs Reviewed  CBC WITH DIFFERENTIAL - Abnormal; Notable for the following:    Hemoglobin 15.6 (*)     HCT 46.9 (*)     All other components within normal limits  CARDIAC PANEL(CRET KIN+CKTOT+MB+TROPI)  PRO B NATRIURETIC PEPTIDE  COMPREHENSIVE METABOLIC PANEL  PROTIME-INR   No results found.   No diagnosis found.   Date: 11/08/2011  Rate: 92  Rhythm: normal sinus rhythm  QRS Axis: normal  Intervals: normal  ST/T Wave abnormalities: nonspecific ST/T changes  Conduction Disutrbances:none  Narrative Interpretation: consider lateral ischemia   Old EKG Reviewed: changes noted    MDM  Hypertensive, ECG changes, SOB  Consult to [employer] cardiology for possible diastolic HF exacerbation. Disposition pending consult, but I anticipate admission. Pt to be ambulated by nursing to assess for hypoxia  The patient appears reasonably stabilized for admission considering the current resources, flow, and capabilities available in the ED at this time, and I doubt any other [facility] requiring further screening and/or treatment in the ED prior to admission.            Jaci Carrel, [place] 11/08/11 1456

## 2011-11-09 DIAGNOSIS — I10 Essential (primary) hypertension: Secondary | ICD-10-CM | POA: Diagnosis not present

## 2011-11-09 DIAGNOSIS — R0989 Other specified symptoms and signs involving the circulatory and respiratory systems: Secondary | ICD-10-CM | POA: Diagnosis not present

## 2011-11-09 LAB — GLUCOSE, CAPILLARY: Glucose-Capillary: 135 mg/dL — ABNORMAL HIGH (ref 70–99)

## 2011-11-09 LAB — MAGNESIUM: Magnesium: 2 mg/dL (ref 1.5–2.5)

## 2011-11-09 LAB — HEMOGLOBIN A1C: Mean Plasma Glucose: 157 mg/dL — ABNORMAL HIGH (ref <117)

## 2011-11-09 LAB — PRO B NATRIURETIC PEPTIDE: Pro B Natriuretic peptide (BNP): 160.8 pg/mL (ref 0–450)

## 2011-11-09 MED ORDER — HYDROCODONE-ACETAMINOPHEN 5-325 MG PO TABS
1.0000 | ORAL_TABLET | ORAL | Status: DC | PRN
  Administered 2011-11-09: 1 via ORAL
  Filled 2011-11-09: qty 1

## 2011-11-09 MED ORDER — ENSURE COMPLETE PO LIQD
237.0000 mL | Freq: Two times a day (BID) | ORAL | Status: DC
  Administered 2011-11-09 – 2011-11-10 (×2): 237 mL via ORAL

## 2011-11-09 MED ORDER — FUROSEMIDE 10 MG/ML IJ SOLN
20.0000 mg | Freq: Once | INTRAMUSCULAR | Status: AC
  Administered 2011-11-09: 20 mg via INTRAVENOUS
  Filled 2011-11-09: qty 2

## 2011-11-09 MED ORDER — IPRATROPIUM BROMIDE 0.02 % IN SOLN
0.5000 mg | Freq: Once | RESPIRATORY_TRACT | Status: AC
  Administered 2011-11-09: 0.5 mg via RESPIRATORY_TRACT
  Filled 2011-11-09: qty 2.5

## 2011-11-09 MED ORDER — ALBUTEROL SULFATE (5 MG/ML) 0.5% IN NEBU
2.5000 mg | INHALATION_SOLUTION | Freq: Once | RESPIRATORY_TRACT | Status: AC
  Administered 2011-11-09: 2.5 mg via RESPIRATORY_TRACT
  Filled 2011-11-09: qty 0.5

## 2011-11-09 NOTE — Progress Notes (Signed)
INITIAL ADULT NUTRITION ASSESSMENT Date: 11/09/2011   Time: 10:48 AM Reason for Assessment: Nutrition Risk    INTERVENTION: 1. Ensure Complete po BID, each supplement provides 350 kcal and 13 grams of protein. 2. Downgrade det to Dysphagia 3 for better tolerance  3. RD will continue to follow     ASSESSMENT: Female 78 y.o.  Dx: HTN (hypertension), malignant  Hx:  Past Medical History  Diagnosis Date  . Cardiomyopathy     non-ischemic 04/2008 EF 45% cardiac MRI no scar  . Hypertension   . Hypercholesterolemia   . CAD (coronary artery disease)   . CHF (congestive heart failure)   . Dyspnea on exertion     chronic  . Type II diabetes mellitus   . Sinus headache     "chronic"  . Stroke 1997    "mild"  . Chronic sinusitis   . Depression     Related Meds:     . sodium chloride   Intravenous STAT  . albuterol  2.5 mg Nebulization Once  . aspirin EC  81 mg Oral Daily  . atorvastatin  10 mg Oral Q M,W,F  . benazepril  40 mg Oral Daily  . carvedilol  3.125 mg Oral BID WC  . digoxin  125 mcg Oral Daily  . enoxaparin (LOVENOX) injection  40 mg Subcutaneous Q24H  . furosemide  20 mg Intravenous Once  . furosemide  20 mg Oral Daily  . glimepiride  2 mg Oral QAC breakfast  . insulin aspart  0-5 Units Subcutaneous QHS  . insulin aspart  0-9 Units Subcutaneous TID WC  . ipratropium  0.5 mg Nebulization Once  . multivitamin with minerals  1 tablet Oral Daily  . sodium chloride  3 mL Intravenous Q12H  . DISCONTD: amLODipine  5 mg Oral Daily  . DISCONTD: azithromycin  500 mg Intravenous Q24H  . DISCONTD: multivitamin  1 tablet Oral Daily     Ht: 5\' 4"  (162.6 cm)  Wt: 87 lb 12.8 oz (39.826 kg)  Ideal Wt: 54.5 kg  % Ideal Wt: 73%  Usual Wt:  Wt Readings from Last 5 Encounters:  11/09/11 87 lb 12.8 oz (39.826 kg)  08/03/11 97 lb (43.999 kg)  10/29/10 93 lb 12.8 oz (42.547 kg)  05/13/10 93 lb (42.185 kg)  11/18/09 92 lb (41.731 kg)    % Usual Wt: 89%  Body mass  index is 15.07 kg/(m^2). Pt is underweight per BMI  Food/Nutrition Related Hx: Reports problems chewing r/t pain with dentures   Labs:  CMP     Component Value Date/Time   NA 132* 11/08/2011 1029   K 4.6 11/08/2011 1029   CL 94* 11/08/2011 1029   CO2 26 11/08/2011 1029   GLUCOSE 261* 11/08/2011 1029   BUN 8 11/08/2011 1029   CREATININE 0.65 11/08/2011 1814   CALCIUM 9.8 11/08/2011 1029   PROT 8.5* 11/08/2011 1029   ALBUMIN 4.5 11/08/2011 1029   AST 21 11/08/2011 1029   ALT 15 11/08/2011 1029   ALKPHOS 54 11/08/2011 1029   BILITOT 0.5 11/08/2011 1029   GFRNONAA 82* 11/08/2011 1814   GFRAA >90 11/08/2011 1814     Intake/Output Summary (Last 24 hours) at 11/09/11 1052 Last data filed at 11/09/11 0815  Gross per 24 hour  Intake    960 ml  Output      0 ml  Net    960 ml     Diet Order: Carb Control  Supplements/Tube Feeding: none  IVF:  DISCONTD: sodium chloride    Estimated Nutritional Needs:   Kcal: 1500-1700 Protein: 45-55 gm  Fluid:  1.5-1.7 L   Per weight hx, pt with 10 lb (10.3%) weight loss in 3 months, severe weight loss. Pt reports that she has not been eating well for the past few weeks r/t not being able to wear her dentures. Only able to eat soft foods. Does have Ensure at home. Pt was not able to provide clear food recall, reports no intake for the last 2-3 days PTA.   Pt meets criteria for severe malnutrition in the context of acute illness or injury 2/2 weight loss of 10.3% in 3 months and visible muscle wasting evident in the clavicles, also likely inadequate intake >5 days.   Pt agreeable to Ensure Complete BID and downgrade diet to Dysphagia 3.   NUTRITION DIAGNOSIS: -Inadequate oral intake (NI-2.1).  Status: Ongoing  RELATED TO: pain/difficulty with chewing from dentures   AS EVIDENCE BY: weight loss of 10% in 3 months  MONITORING/EVALUATION(Goals): Goal: PO intake of meals and supplements will be >75% completion   Monitor: PO intake, weight,  labs  EDUCATION NEEDS: -No education needs identified at this time   DOCUMENTATION CODES Per approved criteria  -Severe malnutrition in the context of acute illness or injury -Underweight   Clarene Duke RD, LDN Pager [phone] After Hours pager [phone]

## 2011-11-09 NOTE — Progress Notes (Signed)
[facility] PROGRESS NOTE  KORINA TRETTER ZOX:[id number] DOB: Jan 16, 1930 DOA: 11/08/2011 PCP: Alfredia Ferguson  Assessment/Plan: Principal Problem:  *HTN (hypertension), malignant Active Problems:  DIABETES MELLITUS, TYPE II  PAROXYSMAL ATRIAL FIBRILLATION  CONGESTIVE HEART FAILURE, HX OF  Viral illness  1. Shortness of breath likely multifactorial i.e.COPD exacerbation, CHF . Improved but not at baseline. Will continue nebs. Will give additional 20mg  lasix IV this am. Volume status +960. Will monitor strict I&O's and daily weight.   2. H/o chronic systolic/diastolic CHF - See #1. BNP 220 on admission and 49 1 year ago.  Appreciate input cardiology who added digoxin level for the morning given slightly decreased renal function (Cr 0.65 but CrCl~43).  3. H/o afib - appears to be in NSR at this time.  HR range 99-50. Taken off Coumadin 07/2011 due to age/frailty/low body weight. No change in meds except check dig level as above.  Will add telemetry monitoring.  4. Diarrhea - resolved at this time.  5. Diabetes mellitus, uncontrolled as A1C>7 (7.1) - CBG 168, 167. Continue Amayrl and SSI 6. HTN - improved. SBP range 106-172. Will monitor closely. 7, Hypnoatremia-mild. Likely related to diuresis. Will monitor closely  Code Status:  Family Communication: Family and patient at bedside Disposition Plan: Home when medically stable hopefully tomorrow   Brief narrative: Very pleasant 78 yo with hx CHF, DM admitted 7/13 with SOB. Seen by cardiology in ED, given lasix and nebs. Symptoms improved.   Consultants:  [facility]  Procedures:    Antibiotics:    HPI/Subjective: Lying in bed visiting with family. Reports improved SOB but not at baseline. Episode SOB this am. Denies pain.   Objective: Filed Vitals:   11/08/11 2030 11/08/11 2100 11/09/11 0232 11/09/11 0728  BP: 166/72 172/92 113/66 106/87  Pulse: 72 99 80 51  Temp:  98.1 F (36.7 C) 97.8 F (36.6 C) 97.7  F (36.5 C)  TempSrc:  Oral Oral Oral  Resp:  20 18 20   Height:      Weight:      SpO2:  99% 98% 99%    Intake/Output Summary (Last 24 hours) at 11/09/11 0920 Last data filed at 11/09/11 0815  Gross per 24 hour  Intake    960 ml  Output      0 ml  Net    960 ml    Exam:   General:  Alert, oriented x 3. Thin and somewhat frail appearing  Cardiovascular: RRR, no MGR  Respiratory: normal effort, BSCTAB, no wheeze, rales but somewhat diminished in bases  Abdomen: flat, soft +BS  Data Reviewed: Basic Metabolic Panel:  Lab 11/08/11 8119 11/08/11 1029  NA -- 132*  K -- 4.6  CL -- 94*  CO2 -- 26  GLUCOSE -- 261*  BUN -- 8  CREATININE 0.65 0.84  CALCIUM -- 9.8  MG -- --  PHOS -- --   Liver Function Tests:  Lab 11/08/11 1029  AST 21  ALT 15  ALKPHOS 54  BILITOT 0.5  PROT 8.5*  ALBUMIN 4.5   No results found for this basename: LIPASE:5,AMYLASE:5 in the last 168 hours No results found for this basename: AMMONIA:5 in the last 168 hours CBC:  Lab 11/08/11 1814 11/08/11 1029  WBC 10.3 9.2  NEUTROABS -- 5.5  HGB 15.6* 15.6*  HCT 46.7* 46.9*  MCV 95.7 95.9  PLT 208 166   Cardiac Enzymes:  Lab 11/08/11 1029  CKTOTAL 161  CKMB 4.3*  CKMBINDEX --  TROPONINI <0.30  BNP (last 3 results)  Basename 11/08/11 1029  PROBNP 220.9   CBG:  Lab 11/09/11 0647 11/08/11 2232  GLUCAP 167* 168*    No results found for this or any previous visit (from the past 240 hour(s)).   Studies: Dg Chest Portable 1 View  11/08/2011  *RADIOLOGY REPORT*  Clinical Data: Shortness of breath.  PORTABLE CHEST - 1 VIEW  Comparison: 04/30/2010  Findings: Lungs are hyperinflated with flattening of the diaphragm consistent with emphysema.  Heart size and pulmonary vascularity are normal.  No infiltrates.  No acute osseous abnormalities.  IMPRESSION: Severe emphysema.  No acute abnormalities.  Original Report Authenticated By: Gwynn Burly, M.D.    Scheduled Meds:   . sodium  chloride   Intravenous STAT  . albuterol  2.5 mg Nebulization Once  . albuterol  2.5 mg Nebulization Once  . aspirin EC  81 mg Oral Daily  . atorvastatin  10 mg Oral Q M,W,F  . benazepril  40 mg Oral Daily  . carvedilol  3.125 mg Oral BID WC  . digoxin  125 mcg Oral Daily  . enoxaparin (LOVENOX) injection  40 mg Subcutaneous Q24H  . furosemide  20 mg Intravenous Once  . furosemide  20 mg Oral Daily  . glimepiride  2 mg Oral QAC breakfast  . insulin aspart  0-5 Units Subcutaneous QHS  . insulin aspart  0-9 Units Subcutaneous TID WC  . ipratropium  0.5 mg Nebulization Once  . ipratropium  0.5 mg Nebulization Once  . multivitamin with minerals  1 tablet Oral Daily  . sodium chloride  3 mL Intravenous Q12H  . DISCONTD: amLODipine  5 mg Oral Daily  . DISCONTD: azithromycin  500 mg Intravenous Q24H  . DISCONTD: multivitamin  1 tablet Oral Daily   Continuous Infusions:   . DISCONTD: sodium chloride      Principal Problem:  *HTN (hypertension), malignant Active Problems:  DIABETES MELLITUS, TYPE II  PAROXYSMAL ATRIAL FIBRILLATION  CONGESTIVE HEART FAILURE, HX OF  Viral illness    Time spent: 35 minutes    [facility] M  [facility] Pager [phone]. If 8PM-8AM, please contact night-coverage at [email or web address], password [facility] 11/09/2011, 9:20 AM  LOS: 1 day             Patient seen and examined, agree with above note. Patient is still not at her baseline, does have grade 1 diastolic dysfunction. Will give one dose of lasix 20 mg iv x 1. Continue po lasix 20 mg po daily which is her usual dose.

## 2011-11-09 NOTE — Clinical Social Work Psychosocial (Signed)
     Clinical Social Work Department BRIEF PSYCHOSOCIAL ASSESSMENT 11/09/2011  Patient:  CHARMIN, AGUINIGA     Account Number:  [id number]     Admit date:  11/08/2011  Clinical Social Worker:  Hulan Fray  Date/Time:  11/09/2011 02:26 PM  Referred by:  RN  Date Referred:  11/08/2011  Other Referral:   Interview type:  Patient Other interview type:    PSYCHOSOCIAL DATA Living Status:  ALONE Admitted from facility:   Level of care:   Primary support name:  [name] Primary support relationship to patient:  CHILD, ADULT Degree of support available:   adequate    CURRENT CONCERNS Current Concerns  Other - See comment   Other Concerns:   advance directive request    SOCIAL WORK ASSESSMENT / PLAN Clinical Social Worker received referral for advance directive request. CSW introduced self and explained reason for visit. Patient's daughter was at beside, but on the phone during assessment. Patient stated she was interested in the advance directive documents. CSW provided patient with advance directive packet and MOST form. CSW explained each document and informed patient that the documents can be notarized during hospital admission. CSW also informed patient of areas outside hospital that can notarize documents, if they do not know a notary. CSW will sign off, as social work intervention is no longer needed.   Assessment/plan status:  No Further Intervention Required Other assessment/ plan:   Information/referral to community resources:   Engineer, production  MOST form    PATIENTS/FAMILYS RESPONSE TO PLAN OF CARE: Patient was agreeable to receive advance directive document. Patient was appreciative of CSW's assistance and providing the documents. Patient stated she wanted to review the documents.

## 2011-11-09 NOTE — Progress Notes (Signed)
Page MD lasix iv and op 20mg  ordered at the same time. MD said give IV am and Po pm

## 2011-11-09 NOTE — Progress Notes (Signed)
Chaplain responded to a patient request for a visit. Chaplain listened, provided emotional support and prayed with the patient. Follow up as needed.

## 2011-11-09 NOTE — Progress Notes (Signed)
Utilization review completed.  

## 2011-11-09 NOTE — Progress Notes (Signed)
Walked Pt down hall hr 100-105s O2 95% per MD request. Page MD on 3x beat RUN of [place] 1703 1704 1707 times. Md order BNP and MG

## 2011-11-09 NOTE — Care Management Note (Signed)
    Page 1 of 1   11/09/2011     3:37:15 PM   CARE MANAGEMENT NOTE 11/09/2011  Patient:  [name], [name]   Account Number:  [id number]  Date Initiated:  11/09/2011  Documentation initiated by:  Donn Pierini  Subjective/Objective Assessment:   Pt admitted with HTN     Action/Plan:   PTA pt lived at home with family   Anticipated DC Date:  11/11/2011   Anticipated DC Plan:  HOME/SELF CARE      DC Planning Services  CM consult      Choice offered to / List presented to:             Status of service:  In process, will continue to follow Medicare Important Message given?   (If response is "NO", the following Medicare IM given date fields will be blank) Date Medicare IM given:   Date Additional Medicare IM given:    Discharge Disposition:    Per UR Regulation:  Reviewed for med. necessity/level of care/duration of stay  If discussed at Long Length of Stay Meetings, dates discussed:    Comments:  PCP- Gari Crown AMBER JOHNSON  11/09/11- 1530- Donn Pierini RN, BSN [phone] Spoke with pt and daughter at bedside- per conversation pt has concerns about affording medications- she states that she does have medicare part D- but this year her premiums went up and she has to pay more for her medications than she use to. She asked about any prescription assistance programs- gave her [email or web address] info and discussed that since she has medication coverage she is not eligible for any assistance from the hospital at discharge. Pt verbalizes understanding and she appreciated the info and will f/u on information.

## 2011-11-09 NOTE — Progress Notes (Signed)
Patient: [name] Date of Encounter: 11/09/2011, 8:16 AM Admit date: 11/08/2011     Subjective  Patient reports SOB this morning that is improved from admission but still worse than her baseline. Still wheezing. She also reports chills overnight and occasional palpitations. She denies orthopnea, PND, chest pain, dizziness, abdominal pain, leg pain.    Objective   Telemetry: non-tele, but did have NSR with PVCs by prior tracings Physical Exam: Filed Vitals:   11/09/11 0728  BP: 106/87  Pulse: 51  Temp: 97.7 F (36.5 C)  Resp: 20   General: Thin, elderly-appearing female, in no acute distress. Head: Normocephalic, atraumatic, sclera non-icteric, no xanthomas, nares are without discharge.  Neck: Negative for carotid bruits. JVD not elevated. Lungs: Faint expiratory wheezes bilaterally. Decreased air movement throughout. She has a raspy cough. Breathing is unlabored. Heart: RRR S1 S2 without murmurs, rubs, or gallops.  Abdomen: Soft, non-tender, non-distended with normoactive bowel sounds. No hepatomegaly. No rebound/guarding. No obvious abdominal masses. Msk:  Strength and tone appear normal for age. Extremities: No clubbing or cyanosis. No edema.  Distal pedal pulses are 2+ and equal bilaterally. Neuro: Alert and oriented X 3. Moves all extremities spontaneously. Psych:  Responds to questions appropriately with a normal affect.    Intake/Output Summary (Last 24 hours) at 11/09/11 0816 Last data filed at 11/09/11 0815  Gross per 24 hour  Intake    960 ml  Output      0 ml  Net    960 ml    Inpatient Medications:    . sodium chloride   Intravenous STAT  . albuterol  2.5 mg Nebulization Once  . aspirin EC  81 mg Oral Daily  . atorvastatin  10 mg Oral Q M,W,F  . benazepril  40 mg Oral Daily  . carvedilol  3.125 mg Oral BID WC  . digoxin  125 mcg Oral Daily  . enoxaparin (LOVENOX) injection  40 mg Subcutaneous Q24H  . furosemide  20 mg Oral Daily  . glimepiride  2  mg Oral QAC breakfast  . insulin aspart  0-5 Units Subcutaneous QHS  . insulin aspart  0-9 Units Subcutaneous TID WC  . ipratropium  0.5 mg Nebulization Once  . multivitamin with minerals  1 tablet Oral Daily  . sodium chloride  3 mL Intravenous Q12H  . DISCONTD: amLODipine  5 mg Oral Daily  . DISCONTD: azithromycin  500 mg Intravenous Q24H  . DISCONTD: multivitamin  1 tablet Oral Daily    Labs:  [facility] 11/08/11 1814 11/08/11 1029  NA -- 132*  K -- 4.6  CL -- 94*  CO2 -- 26  GLUCOSE -- 261*  BUN -- 8  CREATININE 0.65 0.84  CALCIUM -- 9.8  MG -- --  PHOS -- --    Basename 11/08/11 1029  AST 21  ALT 15  ALKPHOS 54  BILITOT 0.5  PROT 8.5*  ALBUMIN 4.5    Basename 11/08/11 1814 11/08/11 1029  WBC 10.3 9.2  NEUTROABS -- 5.5  HGB 15.6* 15.6*  HCT 46.7* 46.9*  MCV 95.7 95.9  PLT 208 166    Basename 11/08/11 1029  CKTOTAL 161  CKMB 4.3*  TROPONINI <0.30    Basename 11/08/11 1814  HGBA1C 7.1*   Radiology/Studies:  1. Chest Portable 1 View 11/08/2011  *RADIOLOGY REPORT*  Clinical Data: Shortness of breath.  PORTABLE CHEST - 1 VIEW  Comparison: 04/30/2010  Findings: Lungs are hyperinflated with flattening of the diaphragm consistent with emphysema.  Heart  size and pulmonary vascularity are normal.  No infiltrates.  No acute osseous abnormalities.  IMPRESSION: Severe emphysema.  No acute abnormalities.  Original Report Authenticated By: Gwynn Burly, M.D.     Assessment and Plan   1. Shortness of breath likely due to COPD exacerbation - will defer mgmt to primary team, but for now will rx albuterol/atrovent neb to improve aeration as she reports definite symptom improvement with this yesterday.   2. H/o chronic systolic/diastolic CHF - currently compensated. Continue current rx. Will add digoxin level for the morning given slightly decreased renal function (Cr 0.65 but CrCl~43). Her CXR does not suggest CHF.  3. H/o afib - appears to be in NSR at this time.  Taken off Coumadin 07/2011 due to age/frailty/low body weight. No change in meds except check dig level as above. Consider keeping on telemetry while inpatient.  4. Diarrhea - resolved at this time.  5. Diabetes mellitus, uncontrolled as A1C>7 (7.1) - mgmt per IM.  6. HTN - improved with measures undertaken yesterday.  Signed, Maryruth Hancock  Cardiology Attending  Patient seen and examined. Agree with above exam, assessment and plan. Her CXR suggests more COPD exacerbation and less CHF exacerbation. Ok to diurese until creatinine bumps or blood pressure falls. She will need aggressive pulmonary toilet. Will defer decision to use steroids to primary team. Please call for additional questions. Will sign off.  Lewayne Bunting, M.D.

## 2011-11-10 DIAGNOSIS — I10 Essential (primary) hypertension: Secondary | ICD-10-CM | POA: Diagnosis not present

## 2011-11-10 DIAGNOSIS — R0609 Other forms of dyspnea: Secondary | ICD-10-CM | POA: Diagnosis not present

## 2011-11-10 DIAGNOSIS — B9789 Other viral agents as the cause of diseases classified elsewhere: Secondary | ICD-10-CM | POA: Diagnosis not present

## 2011-11-10 DIAGNOSIS — I4891 Unspecified atrial fibrillation: Secondary | ICD-10-CM

## 2011-11-10 LAB — BASIC METABOLIC PANEL
Chloride: 97 mEq/L (ref 96–112)
GFR calc Af Amer: 90 mL/min — ABNORMAL LOW (ref >90)
Potassium: 3.6 mEq/L (ref 3.5–5.1)

## 2011-11-10 LAB — DIGOXIN LEVEL: Digoxin Level: 0.8 ng/mL (ref 0.8–2.0)

## 2011-11-10 MED ORDER — POTASSIUM CHLORIDE CRYS ER 20 MEQ PO TBCR
40.0000 meq | EXTENDED_RELEASE_TABLET | Freq: Once | ORAL | Status: AC
  Administered 2011-11-10: 40 meq via ORAL

## 2011-11-10 MED ORDER — AZITHROMYCIN 250 MG PO TABS: ORAL_TABLET | ORAL | Status: AC

## 2011-11-10 MED ORDER — AZITHROMYCIN 250 MG PO TABS: ORAL_TABLET | ORAL | Status: DC

## 2011-11-10 MED ORDER — POTASSIUM CHLORIDE CRYS ER 20 MEQ PO TBCR
EXTENDED_RELEASE_TABLET | ORAL | Status: AC
  Filled 2011-11-10: qty 2

## 2011-11-10 NOTE — Discharge Summary (Signed)
[facility]                                                                                   [name], is a 78 y.o. female  DOB 1930-01-18  MRN [id number].  Admission date:  11/08/2011  Discharge Date:  11/10/2011  Primary MD  Alfredia Ferguson  Admitting Physician  Rhetta Mura, MD  Admission Diagnosis  Dyspnea [786.09] Hypertension [401.9] DIFFICULTY IN BREATHING  Discharge Diagnosis     Principal Problem:  *HTN (hypertension), malignant Active Problems:  DIABETES MELLITUS, TYPE II  PAROXYSMAL ATRIAL FIBRILLATION  CONGESTIVE HEART FAILURE, HX OF  Viral illness      Past Medical History  Diagnosis Date  . Cardiomyopathy     non-ischemic 04/2008 EF 45% cardiac MRI no scar  . Hypertension   . Hypercholesterolemia   . CAD (coronary artery disease)   . CHF (congestive heart failure)   . Dyspnea on exertion     chronic  . Type II diabetes mellitus   . Sinus headache     "chronic"  . Stroke 1997    "mild"  . Chronic sinusitis   . Depression     Past Surgical History  Procedure Date  . Carotid angiogram     2006  . Esophagogastroduodenoscopy     with foreign body removal  . Nasal sinus surgery ~1974;~ 1976  . Tonsillectomy     "when I was a chld"    Recommendations for primary care physician for things to follow:  Monitor BP closely Follow BMP in 2 weeks   Discharge Diagnoses:   Principal Problem:  *HTN (hypertension), malignant Active Problems:  DIABETES MELLITUS, TYPE II  PAROXYSMAL ATRIAL FIBRILLATION  CONGESTIVE HEART FAILURE, HX OF  Viral illness   1. CHF Diastolic dysfunction  Discharge Condition: Stable  Diet recommendation: See Discharge Instructions below    Brief H&P HPI: Patient is an 78 year old female, current everyday smoker, with a history of COPD,HTN, CHF, atrial fibrillation, diabetes, and sinusitis that presents emergency department with chief complaint of worsening shortness of  breath. She denies chest pain. Symptoms have been gradually worsening for about a week and patient reports dyspnea on exertion. She reports that normally she has shortness of breath on walking around her house but over the last 2 weeks, this has worsened. She also nonproductive cough, nasal congestion for the last 2-3 days. She reports to have associated headaches, sinus pains and chills and fever yesterday. She used a nasal spray yesterday without relief. She denies any chest pain, PND, orthopnea, leg swelling, hemoptysis, syncope, weakness, and inability to ambulate, abdominal pain.  Patient's cardiologist is Dr. [name]. 2-D echocardiogram 04/30/10: 60-65% EF. Taken off coumadin on 08/03/2011 (was for Afib clot prevention) and RICA has stable 69-70% stenosis. Pt w poorly controlled HTN, regardless of 3 Rx  History of present illness and  Hospital Course:  See H&P, Labs, Consult and Test reports for all details in brief, patient was admitted for worsening shortness of breath. Was found to have elevated BNP, was given iv lasix and diuresed well in the hospital. At this time she is back to baseline, is not requiring  oxygen, walked in the hallway without oxygen and oxygen saturation was 95% on room air on walking. Patient had PVC's after walking, we checked potassium which was 3.6, with normal magnesium of 2.0. We have replaced potassium. At this time she can be discharged home.  Hypertension: BP has been stable in the hospital, did not require additional medications. Not sure whether she was compliant with meds at home. Will continue Benzepril, Coreg and furosemide.  Diabetes Mellitus: Continue Glimepride at home.  Sinusitis: Still having nasal discharge and left maxillary tenderness on palpation Will discharge home on Z pack  Paroxysmal A Fib HR well controlled Continue digoxin and coreg, aspirin.   Discharge Instructions     Low salt diet Pureed diet  Follow-up Information    Follow up with  Gwynneth Aliment, MD in 2 weeks.   Contact information:   [address] [place] [address] [phone]       Follow up [name] MD in 3 weeks.   Contact information:   [address] [place] [address] [phone]           Today   Subjective:   Lesly Pontarelli today has no headache,no chest abdominal pain,no new weakness tingling or numbness, feels much better wants to go home today. *Denies shortness of breath  Objective:   Blood pressure 112/67, pulse 68, temperature 97.6 F (36.4 C), temperature source Oral, resp. rate 19, height 5\' 4"  (1.626 m), weight 39.8 kg (87 lb 11.9 oz), SpO2 97.00%.   Intake/Output Summary (Last 24 hours) at 11/10/11 0841 Last data filed at 11/10/11 0834  Gross per 24 hour  Intake    900 ml  Output   1200 ml  Net   -300 ml    Exam Awake Alert, Oriented *3, No new F.N deficits, Normal affect [place].AT,PERRAL, left maxillary tenderness on palpation Supple Neck,No JVD, No cervical lymphadenopathy appriciated.  Symmetrical Chest wall movement, Good air movement bilaterally, CTAB RRR,No Gallops,Rubs or new Murmurs, No Parasternal Heave +ve B.Sounds, Abd Soft, Non tender, No organomegaly appriciated, No rebound -guarding or rigidity. No Cyanosis, Clubbing or edema, No new Rash or bruise  Data Review   Cultures -   Radiology Reports - please see full reports for all details Dg Chest Portable 1 View  11/08/2011  *RADIOLOGY REPORT*  Clinical Data: Shortness of breath.  PORTABLE CHEST - 1 VIEW  Comparison: 04/30/2010 IMPRESSION: Severe emphysema.  No acute abnormalities.  Original Report Authenticated By: Gwynn Burly, M.D.    Micro Results  No results found for this or any previous visit (from the past 240 hour(s)).   CBC w Diff: Lab Results  Component Value Date   WBC 10.3 11/08/2011   HGB 15.6* 11/08/2011   HCT 46.7* 11/08/2011   PLT 208 11/08/2011   LYMPHOPCT 31 11/08/2011    MONOPCT 6 11/08/2011   EOSPCT 2 11/08/2011   BASOPCT 1 11/08/2011    CMP: Lab Results  Component Value Date   NA 137 11/10/2011   K 3.6 11/10/2011   CL 97 11/10/2011   CO2 30 11/10/2011   BUN 20 11/10/2011   CREATININE 0.76 11/10/2011   PROT 8.5* 11/08/2011   ALBUMIN 4.5 11/08/2011   BILITOT 0.5 11/08/2011   ALKPHOS 54 11/08/2011   AST 21 11/08/2011   ALT 15 11/08/2011  .    Discharge Medications    Yazhini, Mcaulay  Home Medication Instructions GLO:[id number]   Printed on:11/10/11 1884  Medication Information  furosemide (LASIX) 20 MG tablet Take 20 mg by mouth daily.             glimepiride (AMARYL) 2 MG tablet Take 2 mg by mouth daily before breakfast.             Multiple Vitamin (MULTIVITAMIN) tablet Take 1 tablet by mouth daily.             atorvastatin (LIPITOR) 10 MG tablet Take 10 mg by mouth every Monday, Wednesday, and Friday. Take 1 tablet on M-W- F           CALCIUM PO Take 1 tablet by mouth daily.            POTASSIUM PO Take 1 tablet by mouth daily.           carvedilol (COREG) 3.125 MG tablet Take 3.125 mg by mouth 2 (two) times daily with a meal.           benazepril (LOTENSIN) 40 MG tablet Take 40 mg by mouth daily.           aspirin EC 81 MG tablet Take 81 mg by mouth daily.           digoxin (LANOXIN) 0.125 MG tablet Take 125 mcg by mouth daily.             Discharge Orders    Future Orders Please Complete By Expires   Diet - low sodium heart healthy      Scheduling Instructions:   Dysphagia 3 diet   Increase activity slowly      (HEART FAILURE PATIENTS) Call MD:  Anytime you have any of the following symptoms: 1) 3 pound weight gain in 24 hours or 5 pounds in 1 week 2) shortness of breath, with or without a dry hacking cough 3) swelling in the hands, feet or stomach 4) if you have to sleep on extra pillows at night in order to breathe.         Total Time in preparing paper work, data evaluation and todays exam - 35  minutes  Miles Borkowski S M.D on 11/10/2011 at 8:41 AM  [facility] Office  [phone]

## 2011-11-11 ENCOUNTER — Other Ambulatory Visit: Payer: Self-pay | Admitting: Cardiovascular Disease

## 2011-11-24 ENCOUNTER — Encounter: Payer: Self-pay | Admitting: Physician Assistant

## 2011-11-24 ENCOUNTER — Ambulatory Visit (INDEPENDENT_AMBULATORY_CARE_PROVIDER_SITE_OTHER): Payer: Medicare Other | Admitting: Physician Assistant

## 2011-11-24 VITALS — BP 150/70 | HR 70 | Ht 64.0 in | Wt 91.8 lb

## 2011-11-24 DIAGNOSIS — I5032 Chronic diastolic (congestive) heart failure: Secondary | ICD-10-CM

## 2011-11-24 DIAGNOSIS — J449 Chronic obstructive pulmonary disease, unspecified: Secondary | ICD-10-CM | POA: Diagnosis not present

## 2011-11-24 DIAGNOSIS — I10 Essential (primary) hypertension: Secondary | ICD-10-CM | POA: Diagnosis not present

## 2011-11-24 DIAGNOSIS — I4949 Other premature depolarization: Secondary | ICD-10-CM | POA: Diagnosis not present

## 2011-11-24 DIAGNOSIS — I493 Ventricular premature depolarization: Secondary | ICD-10-CM

## 2011-11-24 LAB — BASIC METABOLIC PANEL
BUN: 15 mg/dL (ref 6–23)
Creatinine, Ser: 0.9 mg/dL (ref 0.4–1.2)
GFR: 77.47 mL/min (ref >60.00)

## 2011-11-24 LAB — MAGNESIUM: Magnesium: 1.9 mg/dL (ref 1.5–2.5)

## 2011-11-24 MED ORDER — AMLODIPINE BESYLATE 5 MG PO TABS: 5.0000 mg | ORAL_TABLET | Freq: Every day | ORAL | Status: DC

## 2011-11-24 NOTE — Progress Notes (Signed)
[address] [place], [place]  [address] Phone: [phone] Fax:  [phone]  Date:  11/24/2011   Name:  [name]   DOB:  16-Sep-1929   MRN:  [id number]  PCP:  Alfredia Ferguson  Primary Cardiologist:  Dr. [name]  Primary Electrophysiologist:  None    History of Present Illness: [name] is a 78 y.o. female who returns for post hospital follow up.  She has a history of systolic CHF in the setting of nonischemic cardiomyopathy, HTN, DM2, COPD with ongoing tobacco abuse, prior atrial fibrillation. Prior EF was 45% and cardiac MRI negative for scar. Echocardiogram 04/2010: EF 60-65%, grade 1 diastolic dysfunction, mild MR, atrial septal aneurysm, PASP 43, small pericardial effusion. Carotid Dopplers 07/2011 RICA 60-79% (followed by Dr. Darrick Penna).  Last seen by Dr. [name] 07/2011. Coumadin was stopped at that time as she appeared to be somewhat frail and had atrial fibrillation documented many years prior without recurrence. She was then admitted 7/16-7/18 with acute dyspnea. She was seen by Dr. Excell Seltzer. This was felt to be more consistent with COPD exacerbation than heart failure. Cardiac enzymes were normal.  BNP was only mildly elevated.  She had no edema on chest x-ray. She was gently diuresed in the hospital.    She continues to smoke.  She continues to note improved breathing. She denies chest discomfort. She denies orthopnea, PND or edema. She notes her weights have been stable. She denies significant cough. She denies fevers.  Wt Readings from Last 3 Encounters:  11/24/11 91 lb 12.8 oz (41.64 kg)  11/10/11 87 lb 11.9 oz (39.8 kg)  08/03/11 97 lb (43.999 kg)     Potassium  Date/Time Value Range Status  11/10/2011  5:00 AM 3.6  3.5 - 5.1 mEq/L Final   Creatinine, Ser  Date/Time Value Range Status  11/10/2011  5:00 AM 0.76  0.50 - 1.10 mg/dL Final   Digoxin Level  Date Value Range Status  11/10/2011 0.8  0.8 - 2.0 ng/mL Final     Past Medical History  Diagnosis Date  . Cardiomyopathy     non-ischemic 04/2008 EF 45% cardiac MRI no scar  . Hypertension   . Hypercholesterolemia   . CAD (coronary artery disease)   . CHF (congestive heart failure)   . Dyspnea on exertion     chronic  . Type II diabetes mellitus   . Sinus headache     "chronic"  . Stroke 1997    "mild"  . Chronic sinusitis   . Depression     Current Outpatient Prescriptions  Medication Sig Dispense Refill  . aspirin EC 81 MG tablet Take 81 mg by mouth daily.      Marland Kitchen atorvastatin (LIPITOR) 10 MG tablet Take 10 mg by mouth every Monday, Wednesday, and Friday. Take 1 tablet on M-W- F      . benazepril (LOTENSIN) 40 MG tablet Take 40 mg by mouth daily.      Marland Kitchen CALCIUM PO Take 1 tablet by mouth daily.       . carvedilol (COREG) 3.125 MG tablet Take 3.125 mg by mouth 2 (two) times daily with a meal.      . carvedilol (COREG) 3.125 MG tablet TAKE ONE TABLET BY MOUTH TWICE DAILY WITH MEALS  60 tablet  12  . digoxin (LANOXIN) 0.125 MG tablet Take 125 mcg by mouth daily.      . furosemide (LASIX) 20 MG tablet Take 20 mg by mouth  daily.        . glimepiride (AMARYL) 2 MG tablet Take 2 mg by mouth daily before breakfast.        . Multiple Vitamin (MULTIVITAMIN) tablet Take 1 tablet by mouth daily.        Marland Kitchen POTASSIUM PO Take 1 tablet by mouth daily.        Allergies: No Known Allergies  History  Substance Use Topics  . Smoking status: Current Everyday Smoker -- 0.5 packs/day for 64 years    Types: Cigarettes  . Smokeless tobacco: Never Used  . Alcohol Use: No     ROS:  Please see the history of present illness.     All other systems reviewed and negative.   PHYSICAL EXAM: VS:  BP 150/70  Pulse 70  Ht 5\' 4"  (1.626 m)  Wt 91 lb 12.8 oz (41.64 kg)  BMI 15.76 kg/m2 Thin frail female in no acute distress HEENT: normal Neck: no JVD Cardiac:  normal S1, S2; RRR; no murmur Lungs:   decreased breath sounds  bilaterally, no wheezing, rhonchi or  rales Abd: soft, nontender, no hepatomegaly Ext: no edema Skin: warm and dry Neuro:  CNs 2-12 intact, no focal abnormalities noted  EKG:  Sinus rhythm, heart rate 70, frequent PVCs, nonspecific ST-T wave changes, no significant change from prior tracing     ASSESSMENT AND PLAN:  1. Chronic diastolic CHF Recent echo with normal LV function. Volume stable on exam. Continue current diuresis. She has frequent PVCs on EKG today. I will check a basic metabolic panel and magnesium level. Followup with Dr. [name] in 3 months.  2. Hypertension Uncontrolled. Start Norvasc 5 mg daily.   3. COPD Followup with PCP. She knows to quit smoking.  Signed, Tereso Newcomer, PA-C  2:17 PM 11/24/2011

## 2011-11-24 NOTE — Patient Instructions (Addendum)
Your physician recommends that you schedule a follow-up appointment in: 3 MONTHS WITH DR. [name]  Your physician recommends that you return for lab work in: TODAY BMET, MAG  START NORVASC 5 MG DAILY

## 2011-11-28 DIAGNOSIS — I10 Essential (primary) hypertension: Secondary | ICD-10-CM | POA: Diagnosis not present

## 2011-11-28 DIAGNOSIS — I509 Heart failure, unspecified: Secondary | ICD-10-CM | POA: Diagnosis not present

## 2012-01-10 DIAGNOSIS — R209 Unspecified disturbances of skin sensation: Secondary | ICD-10-CM | POA: Diagnosis not present

## 2012-01-10 DIAGNOSIS — E1165 Type 2 diabetes mellitus with hyperglycemia: Secondary | ICD-10-CM | POA: Diagnosis not present

## 2012-01-10 DIAGNOSIS — Z79899 Other long term (current) drug therapy: Secondary | ICD-10-CM | POA: Diagnosis not present

## 2012-01-10 DIAGNOSIS — N182 Chronic kidney disease, stage 2 (mild): Secondary | ICD-10-CM | POA: Diagnosis not present

## 2012-01-10 DIAGNOSIS — E559 Vitamin D deficiency, unspecified: Secondary | ICD-10-CM | POA: Diagnosis not present

## 2012-01-10 DIAGNOSIS — J0100 Acute maxillary sinusitis, unspecified: Secondary | ICD-10-CM | POA: Diagnosis not present

## 2012-01-10 DIAGNOSIS — I129 Hypertensive chronic kidney disease with stage 1 through stage 4 chronic kidney disease, or unspecified chronic kidney disease: Secondary | ICD-10-CM | POA: Diagnosis not present

## 2012-01-13 ENCOUNTER — Other Ambulatory Visit: Payer: Self-pay

## 2012-01-13 DIAGNOSIS — I70219 Atherosclerosis of native arteries of extremities with intermittent claudication, unspecified extremity: Secondary | ICD-10-CM

## 2012-01-13 DIAGNOSIS — I6529 Occlusion and stenosis of unspecified carotid artery: Secondary | ICD-10-CM

## 2012-02-02 ENCOUNTER — Other Ambulatory Visit: Payer: Medicare Other

## 2012-02-02 ENCOUNTER — Ambulatory Visit: Payer: Medicare Other | Admitting: Vascular Surgery

## 2012-03-05 ENCOUNTER — Ambulatory Visit: Payer: Medicare Other | Admitting: Cardiovascular Disease

## 2012-05-01 ENCOUNTER — Ambulatory Visit: Payer: Medicare Other | Admitting: Cardiovascular Disease

## 2012-05-10 ENCOUNTER — Ambulatory Visit (INDEPENDENT_AMBULATORY_CARE_PROVIDER_SITE_OTHER): Payer: Medicare Other | Admitting: Cardiovascular Disease

## 2012-05-10 ENCOUNTER — Encounter: Payer: Self-pay | Admitting: Cardiovascular Disease

## 2012-05-10 VITALS — BP 164/80 | HR 70 | Wt 91.0 lb

## 2012-05-10 DIAGNOSIS — I4891 Unspecified atrial fibrillation: Secondary | ICD-10-CM | POA: Diagnosis not present

## 2012-05-10 DIAGNOSIS — E7800 Pure hypercholesterolemia, unspecified: Secondary | ICD-10-CM | POA: Diagnosis not present

## 2012-05-10 DIAGNOSIS — I428 Other cardiomyopathies: Secondary | ICD-10-CM | POA: Diagnosis not present

## 2012-05-10 DIAGNOSIS — I10 Essential (primary) hypertension: Secondary | ICD-10-CM

## 2012-05-10 NOTE — Assessment & Plan Note (Signed)
Euvolemic Dyspnea more related to smoking and COPD at this point

## 2012-05-10 NOTE — Assessment & Plan Note (Signed)
Cholesterol is at goal.  Continue current dose of statin and diet Rx.  No myalgias or side effects.  F/U  LFT's in 6 months. Lab Results  Component Value Date   LDLCALC  Value: 73        Total Cholesterol/HDL:CHD Risk Coronary Heart Disease Risk Table                     Men   Women  1/2 Average Risk   3.4   3.3  Average Risk       5.0   4.4  2 X Average Risk   9.6   7.1  3 X Average Risk  23.4   11.0        Use the calculated Patient Ratio above and the CHD Risk Table to determine the patient's CHD Risk.        ATP III CLASSIFICATION (LDL):  <100     mg/dL   Optimal  161-096  mg/dL   Near or Above                    Optimal  130-159  mg/dL   Borderline  045-409  mg/dL   High  >811     mg/dL   Very High 12/24/4780

## 2012-05-10 NOTE — Progress Notes (Signed)
Patient ID: [name], female   DOB: 1929-06-11, 79 y.o.   MRN: [id number] She has a history of systolic CHF in the setting of nonischemic cardiomyopathy, HTN, DM2, COPD with ongoing tobacco abuse, prior atrial fibrillation. Prior EF was 45% and cardiac MRI negative for scar. Echocardiogram 04/2010: EF 60-65%, grade 1 diastolic dysfunction, mild MR, atrial septal aneurysm, PASP 43, small pericardial effusion. Carotid Dopplers 07/2011 RICA 60-79% (followed by Dr. Darrick Penna). Last seen by Dr. [name] 07/2011. Coumadin was stopped at that time as she appeared to be somewhat frail and had atrial fibrillation documented many years prior without recurrence. She was then admitted 7/16-7/18 with acute dyspnea. She was seen by Dr. Excell Seltzer. This was felt to be more consistent with COPD exacerbation than heart failure. Cardiac enzymes were normal. BNP was only mildly elevated. She had no edema on chest x-ray. She was gently diuresed in the hospital.  She continues to smoke. She continues to note improved breathing. She denies chest discomfort. She denies orthopnea, PND or edema. She notes her weights have been stable. She denies significant cough. She denies fevers.   Wt Readings from Last 3 Encounters:   11/24/11  91 lb 12.8 oz (41.64 kg)   11/10/11  87 lb 11.9 oz (39.8 kg)   08/03/11  97 lb (43.999 kg)    Some sinus problems and left tear duct issues  ROS: Denies fever, malais, weight loss, blurry vision, decreased visual acuity, cough, sputum, SOB, hemoptysis, pleuritic pain, palpitaitons, heartburn, abdominal pain, melena, lower extremity edema, claudication, or rash.  All other systems reviewed and negative  General: Affect appropriate Thin black female HEENT: normal Neck supple with no adenopathy JVP normal no bruits no thyromegaly Lungs clear with no wheezing and good diaphragmatic motion Heart:  S1/S2 no murmur, no rub, gallop or click PMI normal Abdomen: benighn, BS positve, no tenderness, no AAA no  bruit.  No HSM or HJR Distal pulses intact with no bruits No edema Neuro non-focal Skin warm and dry No muscular weakness   Current Outpatient Prescriptions  Medication Sig Dispense Refill  . amLODipine (NORVASC) 5 MG tablet Take 1 tablet (5 mg total) by mouth daily.  30 tablet  11  . aspirin EC 81 MG tablet Take 81 mg by mouth daily.      Marland Kitchen atorvastatin (LIPITOR) 10 MG tablet Take 10 mg by mouth every Monday, Wednesday, and Friday. Take 1 tablet on M-W- F      . benazepril (LOTENSIN) 40 MG tablet Take 40 mg by mouth daily.      Marland Kitchen CALCIUM PO Take 1 tablet by mouth daily.       . carvedilol (COREG) 3.125 MG tablet TAKE ONE TABLET BY MOUTH TWICE DAILY WITH MEALS  60 tablet  12  . digoxin (LANOXIN) 0.125 MG tablet Take 125 mcg by mouth daily.      . furosemide (LASIX) 20 MG tablet Take 20 mg by mouth daily.        Marland Kitchen glimepiride (AMARYL) 2 MG tablet Take 2 mg by mouth daily before breakfast.        . Multiple Vitamin (MULTIVITAMIN) tablet Take 1 tablet by mouth daily.        Marland Kitchen POTASSIUM PO Take 1 tablet by mouth daily.        Allergies  Review of patient's allergies indicates no known allergies.  Electrocardiogram:  Assessment and Plan

## 2012-05-10 NOTE — Assessment & Plan Note (Signed)
Maint NSR no palpitations Anticoagulation stopped due to age and low body mass

## 2012-05-10 NOTE — Patient Instructions (Signed)
**Note De-identified  Obfuscation** Your physician recommends that you continue on your current medications as directed. Please refer to the Current Medication list given to you today.  Your physician wants you to follow-up in: 6 months. You will receive a reminder letter in the mail two months in advance. If you don't receive a letter, please call our office to schedule the follow-up appointment.  

## 2012-05-10 NOTE — Assessment & Plan Note (Signed)
Well controlled.  Continue current medications and low sodium Dash type diet.    

## 2012-05-11 DIAGNOSIS — J069 Acute upper respiratory infection, unspecified: Secondary | ICD-10-CM | POA: Diagnosis not present

## 2012-05-11 DIAGNOSIS — Z79899 Other long term (current) drug therapy: Secondary | ICD-10-CM | POA: Diagnosis not present

## 2012-05-11 DIAGNOSIS — I129 Hypertensive chronic kidney disease with stage 1 through stage 4 chronic kidney disease, or unspecified chronic kidney disease: Secondary | ICD-10-CM | POA: Diagnosis not present

## 2012-05-11 DIAGNOSIS — E1165 Type 2 diabetes mellitus with hyperglycemia: Secondary | ICD-10-CM | POA: Diagnosis not present

## 2012-05-11 DIAGNOSIS — N182 Chronic kidney disease, stage 2 (mild): Secondary | ICD-10-CM | POA: Diagnosis not present

## 2012-05-29 ENCOUNTER — Telehealth: Payer: Self-pay | Admitting: Cardiovascular Disease

## 2012-05-29 ENCOUNTER — Other Ambulatory Visit: Payer: Self-pay

## 2012-05-29 MED ORDER — BENAZEPRIL HCL 40 MG PO TABS: 40.0000 mg | ORAL_TABLET | Freq: Every day | ORAL | Status: DC

## 2012-05-29 MED ORDER — DIGOXIN 125 MCG PO TABS: 125.0000 ug | ORAL_TABLET | Freq: Every day | ORAL | Status: DC

## 2012-05-29 NOTE — Telephone Encounter (Signed)
Pt needs refill digoxin uses [facility]

## 2012-05-29 NOTE — Telephone Encounter (Signed)
Fax Received. Refill Completed. [name] (R.M.A)   

## 2012-06-20 DIAGNOSIS — Z79899 Other long term (current) drug therapy: Secondary | ICD-10-CM | POA: Diagnosis not present

## 2012-07-31 DIAGNOSIS — Z79899 Other long term (current) drug therapy: Secondary | ICD-10-CM | POA: Diagnosis not present

## 2012-07-31 DIAGNOSIS — H6120 Impacted cerumen, unspecified ear: Secondary | ICD-10-CM | POA: Diagnosis not present

## 2012-07-31 DIAGNOSIS — Z0000 Encounter for general adult medical examination without abnormal findings: Secondary | ICD-10-CM | POA: Diagnosis not present

## 2012-07-31 DIAGNOSIS — R7989 Other specified abnormal findings of blood chemistry: Secondary | ICD-10-CM | POA: Diagnosis not present

## 2012-07-31 DIAGNOSIS — Z681 Body mass index (BMI) 19 or less, adult: Secondary | ICD-10-CM | POA: Diagnosis not present

## 2012-07-31 DIAGNOSIS — E7800 Pure hypercholesterolemia, unspecified: Secondary | ICD-10-CM | POA: Diagnosis not present

## 2012-07-31 DIAGNOSIS — Z23 Encounter for immunization: Secondary | ICD-10-CM | POA: Diagnosis not present

## 2012-07-31 DIAGNOSIS — E1129 Type 2 diabetes mellitus with other diabetic kidney complication: Secondary | ICD-10-CM | POA: Diagnosis not present

## 2012-07-31 DIAGNOSIS — E1165 Type 2 diabetes mellitus with hyperglycemia: Secondary | ICD-10-CM | POA: Diagnosis not present

## 2012-07-31 DIAGNOSIS — I129 Hypertensive chronic kidney disease with stage 1 through stage 4 chronic kidney disease, or unspecified chronic kidney disease: Secondary | ICD-10-CM | POA: Diagnosis not present

## 2012-08-08 ENCOUNTER — Other Ambulatory Visit (HOSPITAL_COMMUNITY): Payer: Self-pay | Admitting: Internal Medicine

## 2012-08-08 DIAGNOSIS — Z1231 Encounter for screening mammogram for malignant neoplasm of breast: Secondary | ICD-10-CM

## 2012-08-17 ENCOUNTER — Ambulatory Visit (HOSPITAL_COMMUNITY): Payer: Medicare Other

## 2012-08-24 ENCOUNTER — Ambulatory Visit (HOSPITAL_COMMUNITY)
Admission: RE | Admit: 2012-08-24 | Discharge: 2012-08-24 | Disposition: A | Discharge status: Home / Self Care | Payer: Medicare Other | Source: Ambulatory Visit | Attending: Internal Medicine | Admitting: Internal Medicine

## 2012-08-24 DIAGNOSIS — Z1231 Encounter for screening mammogram for malignant neoplasm of breast: Secondary | ICD-10-CM | POA: Diagnosis not present

## 2012-10-11 ENCOUNTER — Encounter (HOSPITAL_COMMUNITY): Payer: Self-pay | Admitting: *Deleted

## 2012-10-11 ENCOUNTER — Emergency Department (HOSPITAL_COMMUNITY)
Admission: EM | Admit: 2012-10-11 | Discharge: 2012-10-11 | Disposition: A | Discharge status: Home / Self Care | Payer: Medicare Other | Attending: Emergency Medicine | Admitting: Emergency Medicine

## 2012-10-11 ENCOUNTER — Emergency Department (INDEPENDENT_AMBULATORY_CARE_PROVIDER_SITE_OTHER)
Admission: EM | Admit: 2012-10-11 | Discharge: 2012-10-11 | Disposition: A | Discharge status: Nursing Home (Includes ICF) | Payer: Medicare Other | Source: Home / Self Care | Attending: Emergency Medicine | Admitting: Emergency Medicine

## 2012-10-11 ENCOUNTER — Encounter (HOSPITAL_COMMUNITY): Payer: Self-pay | Admitting: Emergency Medicine

## 2012-10-11 ENCOUNTER — Emergency Department (HOSPITAL_COMMUNITY): Payer: Medicare Other

## 2012-10-11 DIAGNOSIS — Z8679 Personal history of other diseases of the circulatory system: Secondary | ICD-10-CM | POA: Insufficient documentation

## 2012-10-11 DIAGNOSIS — R0609 Other forms of dyspnea: Secondary | ICD-10-CM | POA: Diagnosis not present

## 2012-10-11 DIAGNOSIS — Z8673 Personal history of transient ischemic attack (TIA), and cerebral infarction without residual deficits: Secondary | ICD-10-CM | POA: Insufficient documentation

## 2012-10-11 DIAGNOSIS — Z8659 Personal history of other mental and behavioral disorders: Secondary | ICD-10-CM | POA: Insufficient documentation

## 2012-10-11 DIAGNOSIS — R05 Cough: Secondary | ICD-10-CM | POA: Diagnosis not present

## 2012-10-11 DIAGNOSIS — Z79899 Other long term (current) drug therapy: Secondary | ICD-10-CM | POA: Insufficient documentation

## 2012-10-11 DIAGNOSIS — I10 Essential (primary) hypertension: Secondary | ICD-10-CM | POA: Diagnosis not present

## 2012-10-11 DIAGNOSIS — J00 Acute nasopharyngitis [common cold]: Secondary | ICD-10-CM | POA: Diagnosis not present

## 2012-10-11 DIAGNOSIS — R0600 Dyspnea, unspecified: Secondary | ICD-10-CM

## 2012-10-11 DIAGNOSIS — E119 Type 2 diabetes mellitus without complications: Secondary | ICD-10-CM | POA: Diagnosis not present

## 2012-10-11 DIAGNOSIS — F17200 Nicotine dependence, unspecified, uncomplicated: Secondary | ICD-10-CM | POA: Diagnosis not present

## 2012-10-11 DIAGNOSIS — I2510 Atherosclerotic heart disease of native coronary artery without angina pectoris: Secondary | ICD-10-CM | POA: Insufficient documentation

## 2012-10-11 DIAGNOSIS — Z7982 Long term (current) use of aspirin: Secondary | ICD-10-CM | POA: Insufficient documentation

## 2012-10-11 DIAGNOSIS — Z8709 Personal history of other diseases of the respiratory system: Secondary | ICD-10-CM | POA: Insufficient documentation

## 2012-10-11 DIAGNOSIS — R002 Palpitations: Secondary | ICD-10-CM

## 2012-10-11 DIAGNOSIS — I509 Heart failure, unspecified: Secondary | ICD-10-CM | POA: Diagnosis not present

## 2012-10-11 DIAGNOSIS — E7800 Pure hypercholesterolemia, unspecified: Secondary | ICD-10-CM | POA: Diagnosis not present

## 2012-10-11 DIAGNOSIS — R0602 Shortness of breath: Secondary | ICD-10-CM | POA: Diagnosis not present

## 2012-10-11 DIAGNOSIS — R0989 Other specified symptoms and signs involving the circulatory and respiratory systems: Secondary | ICD-10-CM | POA: Insufficient documentation

## 2012-10-11 LAB — CBC WITH DIFFERENTIAL/PLATELET
Basophils Relative: 1 % (ref 0–1)
Eosinophils Relative: 2 % (ref 0–5)
Hemoglobin: 15.1 g/dL — ABNORMAL HIGH (ref 12.0–15.0)
Lymphs Abs: 3.1 10*3/uL (ref 0.7–4.0)
MCH: 31.5 pg (ref 26.0–34.0)
MCV: 93.5 fL (ref 78.0–100.0)
Monocytes Absolute: 0.6 10*3/uL (ref 0.1–1.0)
Neutro Abs: 4.9 10*3/uL (ref 1.7–7.7)
RBC: 4.79 MIL/uL (ref 3.87–5.11)

## 2012-10-11 LAB — URINALYSIS, ROUTINE W REFLEX MICROSCOPIC
Bilirubin Urine: NEGATIVE
Glucose, UA: 250 mg/dL — AB
Ketones, ur: NEGATIVE mg/dL
Leukocytes, UA: NEGATIVE
Nitrite: NEGATIVE
Specific Gravity, Urine: 1.009 (ref 1.005–1.030)
pH: 6 (ref 5.0–8.0)

## 2012-10-11 LAB — COMPREHENSIVE METABOLIC PANEL
AST: 18 U/L (ref 0–37)
Albumin: 4 g/dL (ref 3.5–5.2)
Alkaline Phosphatase: 69 U/L (ref 39–117)
Chloride: 96 mEq/L (ref 96–112)
Potassium: 4.1 mEq/L (ref 3.5–5.1)
Sodium: 135 mEq/L (ref 135–145)
Total Bilirubin: 0.4 mg/dL (ref 0.3–1.2)
Total Protein: 7.9 g/dL (ref 6.0–8.3)

## 2012-10-11 LAB — POCT I-STAT TROPONIN I

## 2012-10-11 MED ORDER — AZITHROMYCIN 250 MG PO TABS: 250.0000 mg | ORAL_TABLET | Freq: Every day | ORAL | Status: AC

## 2012-10-11 MED ORDER — PREDNISONE 20 MG PO TABS
60.0000 mg | ORAL_TABLET | ORAL | Status: AC
  Administered 2012-10-11: 60 mg via ORAL
  Filled 2012-10-11: qty 3

## 2012-10-11 MED ORDER — CLONIDINE HCL 0.1 MG PO TABS
0.1000 mg | ORAL_TABLET | Freq: Once | ORAL | Status: AC
  Administered 2012-10-11: 0.1 mg via ORAL
  Filled 2012-10-11: qty 1

## 2012-10-11 MED ORDER — PREDNISONE 20 MG PO TABS: 60.0000 mg | ORAL_TABLET | Freq: Every day | ORAL | Status: AC

## 2012-10-11 MED ORDER — HYDRALAZINE HCL 20 MG/ML IJ SOLN: 10.0000 mg | INTRAMUSCULAR | Status: DC

## 2012-10-11 MED ORDER — AZITHROMYCIN 250 MG PO TABS
500.0000 mg | ORAL_TABLET | Freq: Once | ORAL | Status: AC
  Administered 2012-10-11: 500 mg via ORAL
  Filled 2012-10-11: qty 2

## 2012-10-11 NOTE — ED Notes (Signed)
Cough x 3 days and now having really thick sputum had syncopal episode yesterday and went to [facility] today sent for further tests

## 2012-10-11 NOTE — ED Provider Notes (Signed)
History     CSN: [id number]  Arrival date & time 10/11/12  1133   First MD Initiated Contact with Patient 10/11/12 1139      Chief Complaint  Patient presents with  . Cough    (Consider location/radiation/quality/duration/timing/severity/associated sxs/prior treatment) HPI  Patient presents with concerns of cough, exertional dyspnea, palpitations. Symptoms began approximately one week ago.  Since onset symptoms have been progressive.  There is no associated focal pain, but with increasing discomfort she presented to urgent care for evaluation. She was then sent here for additional consideration. She denies concurrent lightheadedness, syncope, nausea, vomiting, diarrhea, incontinence. No new swelling anywhere. No fever, though she is subjectively warm. She states that she takes her medication as directed, and that since onset there has been no clear alleviating factors. Symptoms are definitely worse with exertion.  Past Medical History  Diagnosis Date  . Cardiomyopathy     non-ischemic 04/2008 EF 45% cardiac MRI no scar  . Hypertension   . Hypercholesterolemia   . CAD (coronary artery disease)   . CHF (congestive heart failure)   . Dyspnea on exertion     chronic  . Type II diabetes mellitus   . Sinus headache     "chronic"  . Stroke 1997    "mild"  . Chronic sinusitis   . Depression     Past Surgical History  Procedure Laterality Date  . Carotid angiogram      2006  . Esophagogastroduodenoscopy      with foreign body removal  . Nasal sinus surgery  ~1974;~ 1976  . Tonsillectomy      "when I was a chld"    Family History  Problem Relation Age of Onset  . Coronary artery disease      family history    History  Substance Use Topics  . Smoking status: Current Every Day Smoker -- 0.50 packs/day for 64 years    Types: Cigarettes  . Smokeless tobacco: Never Used  . Alcohol Use: No    OB History   Grav Para Term Preterm Abortions TAB SAB Ect Mult Living                   Review of Systems  Constitutional:       Per HPI, otherwise negative  HENT:       Per HPI, otherwise negative  Respiratory:       Per HPI, otherwise negative  Cardiovascular:       Per HPI, otherwise negative  Gastrointestinal: Negative for vomiting.  Endocrine:       Negative aside from HPI  Genitourinary:       Neg aside from HPI   Musculoskeletal:       Per HPI, otherwise negative  Skin: Negative.   Neurological: Negative for syncope.    Allergies  Review of patient's allergies indicates no known allergies.  Home Medications   Current Outpatient Rx  Name  Route  Sig  Dispense  Refill  . acetaminophen (TYLENOL) 500 MG tablet   Oral   Take 1,000 mg by mouth every 6 (six) hours as needed for pain.         Marland Kitchen amLODipine (NORVASC) 5 MG tablet   Oral   Take 1 tablet (5 mg total) by mouth daily.   30 tablet   11   . aspirin EC 81 MG tablet   Oral   Take 81 mg by mouth daily.         . benazepril (  LOTENSIN) 40 MG tablet   Oral   Take 1 tablet (40 mg total) by mouth daily.   90 tablet   3   . CALCIUM PO   Oral   Take 1 tablet by mouth daily.          . carvedilol (COREG) 3.125 MG tablet   Oral   Take 3.125 mg by mouth 2 (two) times daily with a meal.         . digoxin (LANOXIN) 0.125 MG tablet   Oral   Take 1 tablet (125 mcg total) by mouth daily.   90 tablet   3   . fluticasone (FLONASE) 50 MCG/ACT nasal spray   Nasal   Place 2 sprays into the nose as needed for rhinitis.         . furosemide (LASIX) 20 MG tablet   Oral   Take 20 mg by mouth daily.           Marland Kitchen glimepiride (AMARYL) 2 MG tablet   Oral   Take 2 mg by mouth daily before breakfast.           . ipratropium-albuterol (DUONEB) 0.5-2.5 (3) MG/3ML SOLN   Nebulization   Take 3 mLs by nebulization daily as needed (wheezing).         . Multiple Vitamin (MULTIVITAMIN) tablet   Oral   Take 1 tablet by mouth daily.           Marland Kitchen POTASSIUM PO   Oral    Take 1 tablet by mouth daily.           BP 176/143  Pulse 91  Temp(Src) 97.8 F (36.6 C)  Resp 21  SpO2 100%  Physical Exam  Nursing note and vitals reviewed. Constitutional: She is oriented to person, place, and time. She appears well-developed and well-nourished. No distress.  HENT:  Head: Normocephalic and atraumatic.  Eyes: Conjunctivae and EOM are normal.  Cardiovascular: Normal rate and regular rhythm.   Pulmonary/Chest: Effort normal and breath sounds normal. No stridor. No respiratory distress.  Abdominal: She exhibits no distension.  Musculoskeletal: She exhibits no edema.  Neurological: She is alert and oriented to person, place, and time. No cranial nerve deficit.  Skin: Skin is warm and dry.  Psychiatric: She has a normal mood and affect.    ED Course  Procedures (including critical care time)  Labs Reviewed  CBC WITH DIFFERENTIAL  COMPREHENSIVE METABOLIC PANEL  PRO B NATRIURETIC PEPTIDE  URINALYSIS, ROUTINE W REFLEX MICROSCOPIC   No results found.   No diagnosis found. A review of patient's chart demonstrates that a recent nuclear medicine test demonstrated reduced ejection fraction.  Cardiac 90 sinus rhythm normal Pulse ox 100% room air normal A review of the patient's ECG and rhythm strip from the urgent care Center was performed.   Date: 10/11/2012  Rate: 89  Rhythm: normal sinus rhythm  QRS Axis: normal  Intervals: normal  ST/T Wave abnormalities: nonspecific ST/T changes  Conduction Disutrbances:PVC  Narrative Interpretation:   Old EKG Reviewed: none available  ABNORMAL   2:26 PM On repeat exam the patient appears slightly improved.  Labs do not demonstrate elevated troponin are elevated BNP.  However, the patient remained hypertensive. Clonidine provided.  Update: We discussed all results.  Absent BNP elevation, positive troponin, in spite of greater than one day of dyspnea, there is low suspicion for ongoing coronary ischemia or  significant heart failure exacerbation.  The patient's history of COPD, this seems most likely.  We discussed provisional treatment, and admission versus discharge with close outpatient followup.  The patient stated a preference for discharge with outpatient followup. MDM  This elderly female with history of COPD, CHF now presents with rhythm one week of symptoms.  Most concerning to the patient is increased dyspnea on exertion.  Patient has no fever, no leukocytosis, denies significant cough though there is a mild cough.  There is low suspicion for significant ongoing infectious etiology, and her presentation is most consistent with COPD exacerbation. Patient improved while here, was discharged in stable condition, with close outpatient followup.        Gerhard Munch, MD 10/11/12 [phone]

## 2012-10-11 NOTE — ED Notes (Signed)
Pt  Also  Reports   She  Felt  Like  Yesterday  She  Was  About to  [employer]  This  Was  Corrected  When  She  layed  Down

## 2012-10-11 NOTE — ED Notes (Signed)
Pt   Reports  Seasonal  Allergies    She  Reports  Sinus  Congestion  With  Drainage  Leading to  A  Productive  Cough      She   Reports     Symptoms  X  2  Days    She  Ambulated  To  Room  With a  Steady      Fluid  Gait

## 2012-10-11 NOTE — ED Provider Notes (Addendum)
History     CSN: [id number]  Arrival date & time 10/11/12  1001   First MD Initiated Contact with Patient 10/11/12 1012      Chief Complaint  Patient presents with  . Cough    (Consider location/radiation/quality/duration/timing/severity/associated sxs/prior treatment) HPI Comments: Patient presents to urgent care this morning complaining of sinus congestion and productive cough that has been worsening for the last 2-3 days. At this point she denies fevers " put describes feels very bad", patient also describes " palpitations" ' and irregular heartbeats. She denies any chest pains, or feeling nauseous. " But I am concerned about his palpitations and feeling them more frequently"... yesterday at one point felt like I was going to pass out", I had to lay down for many minutes for it to go away"  Patient is a 79 y.o. female presenting with cough. The history is provided by the patient.  Cough Cough characteristics:  Productive and paroxysmal Sputum characteristics:  White Severity:  Moderate Duration:  2 days Timing:  Intermittent Worsened by:  Deep breathing Associated symptoms: shortness of breath and sinus congestion   Associated symptoms: no chest pain, no chills, no diaphoresis, no fever, no rash and no weight loss   Risk factors: no recent infection and no recent travel     Past Medical History  Diagnosis Date  . Cardiomyopathy     non-ischemic 04/2008 EF 45% cardiac MRI no scar  . Hypertension   . Hypercholesterolemia   . CAD (coronary artery disease)   . CHF (congestive heart failure)   . Dyspnea on exertion     chronic  . Type II diabetes mellitus   . Sinus headache     "chronic"  . Stroke 1997    "mild"  . Chronic sinusitis   . Depression     Past Surgical History  Procedure Laterality Date  . Carotid angiogram      2006  . Esophagogastroduodenoscopy      with foreign body removal  . Nasal sinus surgery  ~1974;~ 1976  . Tonsillectomy      "when I was a  chld"    Family History  Problem Relation Age of Onset  . Coronary artery disease      family history    History  Substance Use Topics  . Smoking status: Current Every Day Smoker -- 0.50 packs/day for 64 years    Types: Cigarettes  . Smokeless tobacco: Never Used  . Alcohol Use: No    OB History   Grav Para Term Preterm Abortions TAB SAB Ect Mult Living                  Review of Systems  Constitutional: Positive for activity change. Negative for fever, chills, weight loss and diaphoresis.  Respiratory: Positive for cough and shortness of breath. Negative for chest tightness.   Cardiovascular: Positive for palpitations. Negative for chest pain and leg swelling.  Skin: Negative for color change and rash.    Allergies  Review of patient's allergies indicates no known allergies.  Home Medications   Current Outpatient Rx  Name  Route  Sig  Dispense  Refill  . amLODipine (NORVASC) 5 MG tablet   Oral   Take 1 tablet (5 mg total) by mouth daily.   30 tablet   11   . atorvastatin (LIPITOR) 10 MG tablet   Oral   Take 10 mg by mouth every Monday, Wednesday, and Friday. Take 1 tablet on M-W- F         .  benazepril (LOTENSIN) 40 MG tablet   Oral   Take 1 tablet (40 mg total) by mouth daily.   90 tablet   3   . CALCIUM PO   Oral   Take 1 tablet by mouth daily.          . carvedilol (COREG) 3.125 MG tablet      TAKE ONE TABLET BY MOUTH TWICE DAILY WITH MEALS   60 tablet   12   . digoxin (LANOXIN) 0.125 MG tablet   Oral   Take 1 tablet (125 mcg total) by mouth daily.   90 tablet   3   . furosemide (LASIX) 20 MG tablet   Oral   Take 20 mg by mouth daily.           Marland Kitchen glimepiride (AMARYL) 2 MG tablet   Oral   Take 2 mg by mouth daily before breakfast.           . Multiple Vitamin (MULTIVITAMIN) tablet   Oral   Take 1 tablet by mouth daily.           Marland Kitchen POTASSIUM PO   Oral   Take 1 tablet by mouth daily.           BP 186/91  Pulse 90   Temp(Src) 97.8 F (36.6 C) (Oral)  Resp 20  SpO2 100%  Physical Exam  Vitals reviewed. Neck: No JVD present.  Cardiovascular: Normal heart sounds and normal pulses.  An irregular rhythm present.  Extrasystoles are present. Exam reveals no gallop and no friction rub.   No murmur heard. Pulmonary/Chest: No accessory muscle usage. No respiratory distress. She has no decreased breath sounds. She has no wheezes. She has no rhonchi. She has no rales.  Skin: She is not diaphoretic.    ED Course  Procedures (including critical care time)  Labs Reviewed - No data to display No results found.   1. Palpitations   2. Cough     EKG with ST or T-wave abnormalities- on further rhythm strip observation symptoms be related to wide QRS complexes thought to be PVCs- suspect, more than 1  Foci  MDM  Patient mildly symptomatic reporting episodes of feeling,  " presyncopal "predominantly of respiratory symptoms. During cardiovascular exam it is noted the patient had frequent ectopic beats and increase palpitation perception by patient. EKG revealed wide QRS complexes seemed to be more than 1 foci PVCs. At this point feel patient will need further workup and perhaps a digoxin check and further monitoring. Patient is hemodynamically stable- moderate hypertension        Jimmie Molly, MD 10/11/12 1048  Jimmie Molly, MD 10/11/12 1054

## 2012-10-31 DIAGNOSIS — E1129 Type 2 diabetes mellitus with other diabetic kidney complication: Secondary | ICD-10-CM | POA: Diagnosis not present

## 2012-10-31 DIAGNOSIS — Z79899 Other long term (current) drug therapy: Secondary | ICD-10-CM | POA: Diagnosis not present

## 2012-10-31 DIAGNOSIS — I129 Hypertensive chronic kidney disease with stage 1 through stage 4 chronic kidney disease, or unspecified chronic kidney disease: Secondary | ICD-10-CM | POA: Diagnosis not present

## 2012-10-31 DIAGNOSIS — N182 Chronic kidney disease, stage 2 (mild): Secondary | ICD-10-CM | POA: Diagnosis not present

## 2012-10-31 DIAGNOSIS — J209 Acute bronchitis, unspecified: Secondary | ICD-10-CM | POA: Diagnosis not present

## 2012-11-12 ENCOUNTER — Ambulatory Visit: Payer: Medicare Other | Admitting: Cardiovascular Disease

## 2012-11-13 ENCOUNTER — Other Ambulatory Visit: Payer: Self-pay | Admitting: *Deleted

## 2012-11-13 MED ORDER — CARVEDILOL 3.125 MG PO TABS: 3.1250 mg | ORAL_TABLET | Freq: Two times a day (BID) | ORAL | Status: DC

## 2012-11-22 ENCOUNTER — Encounter: Payer: Self-pay | Admitting: Cardiovascular Disease

## 2012-12-17 ENCOUNTER — Other Ambulatory Visit: Payer: Self-pay | Admitting: Physician Assistant

## 2012-12-25 ENCOUNTER — Emergency Department (HOSPITAL_COMMUNITY): Payer: Medicare Other

## 2012-12-25 ENCOUNTER — Emergency Department (HOSPITAL_COMMUNITY)
Admission: EM | Admit: 2012-12-25 | Discharge: 2012-12-25 | Disposition: A | Discharge status: Home / Self Care | Payer: Medicare Other | Attending: Emergency Medicine | Admitting: Emergency Medicine

## 2012-12-25 ENCOUNTER — Encounter (HOSPITAL_COMMUNITY): Payer: Self-pay | Admitting: Emergency Medicine

## 2012-12-25 DIAGNOSIS — J441 Chronic obstructive pulmonary disease with (acute) exacerbation: Secondary | ICD-10-CM | POA: Insufficient documentation

## 2012-12-25 DIAGNOSIS — E119 Type 2 diabetes mellitus without complications: Secondary | ICD-10-CM | POA: Insufficient documentation

## 2012-12-25 DIAGNOSIS — R5381 Other malaise: Secondary | ICD-10-CM | POA: Diagnosis not present

## 2012-12-25 DIAGNOSIS — Z79899 Other long term (current) drug therapy: Secondary | ICD-10-CM | POA: Diagnosis not present

## 2012-12-25 DIAGNOSIS — F17200 Nicotine dependence, unspecified, uncomplicated: Secondary | ICD-10-CM | POA: Diagnosis not present

## 2012-12-25 DIAGNOSIS — Z8659 Personal history of other mental and behavioral disorders: Secondary | ICD-10-CM | POA: Diagnosis not present

## 2012-12-25 DIAGNOSIS — R7309 Other abnormal glucose: Secondary | ICD-10-CM | POA: Diagnosis not present

## 2012-12-25 DIAGNOSIS — I2510 Atherosclerotic heart disease of native coronary artery without angina pectoris: Secondary | ICD-10-CM | POA: Insufficient documentation

## 2012-12-25 DIAGNOSIS — N390 Urinary tract infection, site not specified: Secondary | ICD-10-CM | POA: Diagnosis not present

## 2012-12-25 DIAGNOSIS — I10 Essential (primary) hypertension: Secondary | ICD-10-CM | POA: Diagnosis not present

## 2012-12-25 DIAGNOSIS — Z8673 Personal history of transient ischemic attack (TIA), and cerebral infarction without residual deficits: Secondary | ICD-10-CM | POA: Insufficient documentation

## 2012-12-25 DIAGNOSIS — R739 Hyperglycemia, unspecified: Secondary | ICD-10-CM

## 2012-12-25 DIAGNOSIS — Z8639 Personal history of other endocrine, nutritional and metabolic disease: Secondary | ICD-10-CM | POA: Insufficient documentation

## 2012-12-25 DIAGNOSIS — Z862 Personal history of diseases of the blood and blood-forming organs and certain disorders involving the immune mechanism: Secondary | ICD-10-CM | POA: Diagnosis not present

## 2012-12-25 DIAGNOSIS — I509 Heart failure, unspecified: Secondary | ICD-10-CM | POA: Diagnosis not present

## 2012-12-25 DIAGNOSIS — M542 Cervicalgia: Secondary | ICD-10-CM | POA: Insufficient documentation

## 2012-12-25 DIAGNOSIS — R531 Weakness: Secondary | ICD-10-CM

## 2012-12-25 DIAGNOSIS — R0989 Other specified symptoms and signs involving the circulatory and respiratory systems: Secondary | ICD-10-CM | POA: Diagnosis not present

## 2012-12-25 LAB — URINALYSIS, ROUTINE W REFLEX MICROSCOPIC
Glucose, UA: >1000 mg/dL — AB
Ketones, ur: NEGATIVE mg/dL
Nitrite: NEGATIVE
Protein, ur: NEGATIVE mg/dL
pH: 5.5 (ref 5.0–8.0)

## 2012-12-25 LAB — URINE MICROSCOPIC-ADD ON

## 2012-12-25 LAB — POCT I-STAT TROPONIN I

## 2012-12-25 LAB — CBC
MCV: 93.4 fL (ref 78.0–100.0)
Platelets: 168 10*3/uL (ref 150–400)
RBC: 4.39 MIL/uL (ref 3.87–5.11)
RDW: 13.4 % (ref 11.5–15.5)
WBC: 9.8 10*3/uL (ref 4.0–10.5)

## 2012-12-25 LAB — BASIC METABOLIC PANEL
CO2: 25 mEq/L (ref 19–32)
Chloride: 91 mEq/L — ABNORMAL LOW (ref 96–112)
Creatinine, Ser: 0.76 mg/dL (ref 0.50–1.10)
GFR calc Af Amer: 89 mL/min — ABNORMAL LOW (ref >90)
Potassium: 5.3 mEq/L — ABNORMAL HIGH (ref 3.5–5.1)

## 2012-12-25 LAB — PRO B NATRIURETIC PEPTIDE: Pro B Natriuretic peptide (BNP): 174.6 pg/mL (ref 0–450)

## 2012-12-25 LAB — DIGOXIN LEVEL: Digoxin Level: 1.4 ng/mL (ref 0.8–2.0)

## 2012-12-25 MED ORDER — CEPHALEXIN 500 MG PO CAPS: 500.0000 mg | ORAL_CAPSULE | Freq: Four times a day (QID) | ORAL | Status: DC

## 2012-12-25 MED ORDER — INSULIN ASPART 100 UNIT/ML ~~LOC~~ SOLN
5.0000 [IU] | Freq: Once | SUBCUTANEOUS | Status: AC
  Administered 2012-12-25: 5 [IU] via INTRAVENOUS
  Filled 2012-12-25: qty 1

## 2012-12-25 MED ORDER — CEPHALEXIN 500 MG PO CAPS: 500.0000 mg | ORAL_CAPSULE | Freq: Three times a day (TID) | ORAL | Status: DC

## 2012-12-25 MED ORDER — SODIUM CHLORIDE 0.9 % IV BOLUS (SEPSIS)
500.0000 mL | Freq: Once | INTRAVENOUS | Status: AC
  Administered 2012-12-25: 500 mL via INTRAVENOUS

## 2012-12-25 MED ORDER — ALBUTEROL SULFATE (5 MG/ML) 0.5% IN NEBU
5.0000 mg | INHALATION_SOLUTION | Freq: Once | RESPIRATORY_TRACT | Status: AC
  Administered 2012-12-25: 5 mg via RESPIRATORY_TRACT
  Filled 2012-12-25: qty 1

## 2012-12-25 MED ORDER — DEXTROSE 5 % IV SOLN
1.0000 g | Freq: Once | INTRAVENOUS | Status: AC
  Administered 2012-12-25: 1 g via INTRAVENOUS
  Filled 2012-12-25: qty 10

## 2012-12-25 NOTE — ED Notes (Signed)
Pt c/o SOB and left sided neck and head pain starting last night

## 2012-12-25 NOTE — ED Provider Notes (Signed)
CSN: [id number]     Arrival date & time 12/25/12  1329 History   First MD Initiated Contact with Patient 12/25/12 1355     Chief Complaint  Patient presents with  . Shortness of Breath  . Neck Pain   (Consider location/radiation/quality/duration/timing/severity/associated sxs/prior Treatment) HPI Comments: Patient with past medical history significant for COPD and hypertension presents with complaints of weakness and shortness of breath for the past 2 days. She started yesterday not feeling well states that she had to urinate frequently. She denies dysuria, abdominal pain, flank pain fevers or chills. She denies any chest pain, cough.  Patient is a 79 y.o. female presenting with shortness of breath and neck pain. The history is provided by the patient.  Shortness of Breath Severity:  Moderate Onset quality:  Gradual Duration:  2 days Timing:  Constant Progression:  Worsening Chronicity:  Recurrent Context: activity   Relieved by:  Inhaler Worsened by:  Nothing tried Ineffective treatments:  None tried Associated symptoms: neck pain   Neck Pain   Past Medical History  Diagnosis Date  . Cardiomyopathy     non-ischemic 04/2008 EF 45% cardiac MRI no scar  . Hypertension   . Hypercholesterolemia   . CAD (coronary artery disease)   . CHF (congestive heart failure)   . Dyspnea on exertion     chronic  . Type II diabetes mellitus   . Sinus headache     "chronic"  . Stroke 1997    "mild"  . Chronic sinusitis   . Depression    Past Surgical History  Procedure Laterality Date  . Carotid angiogram      2006  . Esophagogastroduodenoscopy      with foreign body removal  . Nasal sinus surgery  ~1974;~ 1976  . Tonsillectomy      "when I was a chld"   Family History  Problem Relation Age of Onset  . Coronary artery disease      family history   History  Substance Use Topics  . Smoking status: Current Every Day Smoker -- 0.50 packs/day for 64 years    Types: Cigarettes  .  Smokeless tobacco: Never Used  . Alcohol Use: No   OB History   Grav Para Term Preterm Abortions TAB SAB Ect Mult Living                 Review of Systems  HENT: Positive for neck pain.   Respiratory: Positive for shortness of breath.   All other systems reviewed and are negative.    Allergies  Review of patient's allergies indicates no known allergies.  Home Medications   Current Outpatient Rx  Name  Route  Sig  Dispense  Refill  . acetaminophen (TYLENOL) 500 MG tablet   Oral   Take 1,000 mg by mouth every 6 (six) hours as needed for pain.         Marland Kitchen amLODipine (NORVASC) 5 MG tablet      TAKE ONE TABLET BY MOUTH EVERY DAY   30 tablet   6   . aspirin EC 81 MG tablet   Oral   Take 81 mg by mouth daily.         . benazepril (LOTENSIN) 40 MG tablet   Oral   Take 1 tablet (40 mg total) by mouth daily.   90 tablet   3   . CALCIUM PO   Oral   Take 1 tablet by mouth daily.          Marland Kitchen  carvedilol (COREG) 3.125 MG tablet   Oral   Take 1 tablet (3.125 mg total) by mouth 2 (two) times daily with a meal.   60 tablet   2   . digoxin (LANOXIN) 0.125 MG tablet   Oral   Take 1 tablet (125 mcg total) by mouth daily.   90 tablet   3   . fluticasone (FLONASE) 50 MCG/ACT nasal spray   Nasal   Place 2 sprays into the nose as needed for rhinitis.         . furosemide (LASIX) 20 MG tablet   Oral   Take 20 mg by mouth daily.           Marland Kitchen glimepiride (AMARYL) 2 MG tablet   Oral   Take 2 mg by mouth daily before breakfast.           . ipratropium-albuterol (DUONEB) 0.5-2.5 (3) MG/3ML SOLN   Nebulization   Take 3 mLs by nebulization daily as needed (wheezing).         . Multiple Vitamin (MULTIVITAMIN) tablet   Oral   Take 1 tablet by mouth daily.           Marland Kitchen POTASSIUM PO   Oral   Take 1 tablet by mouth daily.          BP 169/75  Pulse 82  Temp(Src) 97.9 F (36.6 C) (Oral)  Resp 18  Ht 5\' 2"  (1.575 m)  Wt 86 lb (39.009 kg)  BMI 15.73 kg/m2   SpO2 100% Physical Exam  Nursing note and vitals reviewed. Constitutional: She is oriented to person, place, and time.  Patient is a thin elderly female in no acute distress.  HENT:  Head: Normocephalic and atraumatic.  Neck: Normal range of motion. Neck supple.  Cardiovascular: Normal rate, regular rhythm and normal heart sounds.   No murmur heard. Pulmonary/Chest: Effort normal and breath sounds normal. No respiratory distress. She has no wheezes.  Abdominal: Soft. Bowel sounds are normal. She exhibits no distension. There is no tenderness.  Musculoskeletal: Normal range of motion. She exhibits no edema.  Neurological: She is alert and oriented to person, place, and time.  Skin: Skin is warm and dry.    ED Course  Procedures (including critical care time) Labs Review Labs Reviewed  CBC  BASIC METABOLIC PANEL  PRO B NATRIURETIC PEPTIDE  URINALYSIS, ROUTINE W REFLEX MICROSCOPIC   Imaging Review No results found.   Date: 12/25/2012  Rate: 86  Rhythm: normal sinus rhythm  QRS Axis: normal  Intervals: normal  ST/T Wave abnormalities: nonspecific T wave changes  Conduction Disutrbances:none  Narrative Interpretation:   Old EKG Reviewed: unchanged    MDM  No diagnosis found. Patient presents here with complaints of weakness and frequent urination since yesterday. Workup reveals an elevated glucose and urinary tract infection. She will be given 500 cc of normal saline along with insulin and ceftriaxone. Sugars will be rechecked in an hour after receiving these medications. If sugar is improving patient will be discharged with prescription for antibiotics and when necessary followup. She will be advised to follow her sugars for the next several days. If they continue to run high she needs to see her primary doctor to discuss changes in her medications.    Geoffery Lyons, MD 12/25/12 1556

## 2012-12-28 LAB — URINE CULTURE

## 2012-12-29 ENCOUNTER — Telehealth (HOSPITAL_COMMUNITY): Payer: Self-pay | Admitting: Emergency Medicine

## 2012-12-29 NOTE — ED Notes (Signed)
Post ED Visit - Positive Culture Follow-up  Culture report reviewed by antimicrobial stewardship pharmacist: []  Wes Dulaney, Pharm.D., BCPS []  Celedonio Miyamoto, Pharm.D., BCPS []  Georgina Pillion, Pharm.D., BCPS []  [place], [address].D., BCPS, AAHIVP []  Estella Husk, Pharm.D., BCPS, AAHIVP [x]  Lysle Pearl, Pharm.D., BCPS  Positive urine culture Treated with Keflex, organism sensitive to the same and no further patient follow-up is required at this time.  Kylie A Holland 12/29/2012, 10:53 AM

## 2013-01-04 DIAGNOSIS — Z09 Encounter for follow-up examination after completed treatment for conditions other than malignant neoplasm: Secondary | ICD-10-CM | POA: Diagnosis not present

## 2013-01-04 DIAGNOSIS — N390 Urinary tract infection, site not specified: Secondary | ICD-10-CM | POA: Diagnosis not present

## 2013-01-04 DIAGNOSIS — R7309 Other abnormal glucose: Secondary | ICD-10-CM | POA: Diagnosis not present

## 2013-01-04 DIAGNOSIS — R5381 Other malaise: Secondary | ICD-10-CM | POA: Diagnosis not present

## 2013-02-12 ENCOUNTER — Other Ambulatory Visit: Payer: Self-pay | Admitting: Cardiovascular Disease

## 2013-02-18 DIAGNOSIS — J449 Chronic obstructive pulmonary disease, unspecified: Secondary | ICD-10-CM | POA: Diagnosis not present

## 2013-02-18 DIAGNOSIS — J0100 Acute maxillary sinusitis, unspecified: Secondary | ICD-10-CM | POA: Diagnosis not present

## 2013-02-27 ENCOUNTER — Encounter: Payer: Medicare Other | Admitting: Physician Assistant

## 2013-06-12 ENCOUNTER — Ambulatory Visit: Payer: Medicare Other | Admitting: Cardiovascular Disease

## 2013-06-12 ENCOUNTER — Other Ambulatory Visit: Payer: Self-pay | Admitting: Cardiovascular Disease

## 2013-06-17 ENCOUNTER — Other Ambulatory Visit: Payer: Self-pay | Admitting: Cardiovascular Disease

## 2013-06-27 ENCOUNTER — Ambulatory Visit: Payer: Medicare Other | Admitting: Cardiovascular Disease

## 2013-06-28 DIAGNOSIS — E1165 Type 2 diabetes mellitus with hyperglycemia: Secondary | ICD-10-CM | POA: Diagnosis not present

## 2013-06-28 DIAGNOSIS — Z79899 Other long term (current) drug therapy: Secondary | ICD-10-CM | POA: Diagnosis not present

## 2013-06-28 DIAGNOSIS — N183 Chronic kidney disease, stage 3 unspecified: Secondary | ICD-10-CM | POA: Diagnosis not present

## 2013-06-28 DIAGNOSIS — E1129 Type 2 diabetes mellitus with other diabetic kidney complication: Secondary | ICD-10-CM | POA: Diagnosis not present

## 2013-06-28 DIAGNOSIS — N058 Unspecified nephritic syndrome with other morphologic changes: Secondary | ICD-10-CM | POA: Diagnosis not present

## 2013-06-28 DIAGNOSIS — I129 Hypertensive chronic kidney disease with stage 1 through stage 4 chronic kidney disease, or unspecified chronic kidney disease: Secondary | ICD-10-CM | POA: Diagnosis not present

## 2013-06-28 DIAGNOSIS — E559 Vitamin D deficiency, unspecified: Secondary | ICD-10-CM | POA: Diagnosis not present

## 2013-08-27 ENCOUNTER — Other Ambulatory Visit: Payer: Self-pay | Admitting: Cardiovascular Disease

## 2013-08-29 ENCOUNTER — Other Ambulatory Visit: Payer: Self-pay | Admitting: Physician Assistant

## 2013-09-30 ENCOUNTER — Other Ambulatory Visit: Payer: Self-pay | Admitting: Cardiovascular Disease

## 2013-10-23 DIAGNOSIS — N183 Chronic kidney disease, stage 3 unspecified: Secondary | ICD-10-CM | POA: Diagnosis not present

## 2013-10-23 DIAGNOSIS — I129 Hypertensive chronic kidney disease with stage 1 through stage 4 chronic kidney disease, or unspecified chronic kidney disease: Secondary | ICD-10-CM | POA: Diagnosis not present

## 2013-10-23 DIAGNOSIS — E1165 Type 2 diabetes mellitus with hyperglycemia: Secondary | ICD-10-CM | POA: Diagnosis not present

## 2013-10-23 DIAGNOSIS — J329 Chronic sinusitis, unspecified: Secondary | ICD-10-CM | POA: Diagnosis not present

## 2013-10-23 DIAGNOSIS — N058 Unspecified nephritic syndrome with other morphologic changes: Secondary | ICD-10-CM | POA: Diagnosis not present

## 2013-10-23 DIAGNOSIS — E1129 Type 2 diabetes mellitus with other diabetic kidney complication: Secondary | ICD-10-CM | POA: Diagnosis not present

## 2013-11-25 DIAGNOSIS — N058 Unspecified nephritic syndrome with other morphologic changes: Secondary | ICD-10-CM | POA: Diagnosis not present

## 2013-11-25 DIAGNOSIS — E1165 Type 2 diabetes mellitus with hyperglycemia: Secondary | ICD-10-CM | POA: Diagnosis not present

## 2013-11-25 DIAGNOSIS — N183 Chronic kidney disease, stage 3 unspecified: Secondary | ICD-10-CM | POA: Diagnosis not present

## 2013-11-25 DIAGNOSIS — J329 Chronic sinusitis, unspecified: Secondary | ICD-10-CM | POA: Diagnosis not present

## 2013-11-25 DIAGNOSIS — E1129 Type 2 diabetes mellitus with other diabetic kidney complication: Secondary | ICD-10-CM | POA: Diagnosis not present

## 2013-11-26 DIAGNOSIS — E1129 Type 2 diabetes mellitus with other diabetic kidney complication: Secondary | ICD-10-CM | POA: Diagnosis not present

## 2013-11-26 DIAGNOSIS — E1165 Type 2 diabetes mellitus with hyperglycemia: Secondary | ICD-10-CM | POA: Diagnosis not present

## 2013-11-26 DIAGNOSIS — R946 Abnormal results of thyroid function studies: Secondary | ICD-10-CM | POA: Diagnosis not present

## 2013-11-27 ENCOUNTER — Other Ambulatory Visit: Payer: Self-pay | Admitting: Cardiovascular Disease

## 2013-12-10 ENCOUNTER — Other Ambulatory Visit: Payer: Self-pay | Admitting: Cardiovascular Disease

## 2013-12-16 DIAGNOSIS — J0100 Acute maxillary sinusitis, unspecified: Secondary | ICD-10-CM | POA: Diagnosis not present

## 2013-12-16 DIAGNOSIS — J322 Chronic ethmoidal sinusitis: Secondary | ICD-10-CM | POA: Diagnosis not present

## 2013-12-16 DIAGNOSIS — J321 Chronic frontal sinusitis: Secondary | ICD-10-CM | POA: Diagnosis not present

## 2013-12-19 DIAGNOSIS — J3489 Other specified disorders of nose and nasal sinuses: Secondary | ICD-10-CM | POA: Diagnosis not present

## 2013-12-19 DIAGNOSIS — Z9889 Other specified postprocedural states: Secondary | ICD-10-CM | POA: Diagnosis not present

## 2013-12-25 ENCOUNTER — Ambulatory Visit (INDEPENDENT_AMBULATORY_CARE_PROVIDER_SITE_OTHER): Payer: Medicare Other | Admitting: Cardiology

## 2013-12-25 ENCOUNTER — Encounter: Payer: Self-pay | Admitting: Cardiology

## 2013-12-25 VITALS — BP 170/90 | HR 118 | Ht 62.0 in | Wt 91.4 lb

## 2013-12-25 DIAGNOSIS — I2589 Other forms of chronic ischemic heart disease: Secondary | ICD-10-CM | POA: Diagnosis not present

## 2013-12-25 DIAGNOSIS — E785 Hyperlipidemia, unspecified: Secondary | ICD-10-CM | POA: Diagnosis not present

## 2013-12-25 DIAGNOSIS — I10 Essential (primary) hypertension: Secondary | ICD-10-CM

## 2013-12-25 DIAGNOSIS — I5032 Chronic diastolic (congestive) heart failure: Secondary | ICD-10-CM | POA: Diagnosis not present

## 2013-12-25 DIAGNOSIS — I255 Ischemic cardiomyopathy: Secondary | ICD-10-CM

## 2013-12-25 MED ORDER — CARVEDILOL 3.125 MG PO TABS: 3.1250 mg | ORAL_TABLET | Freq: Two times a day (BID) | ORAL | Status: DC

## 2013-12-25 MED ORDER — DIGOXIN 125 MCG PO TABS: 0.1250 mg | ORAL_TABLET | Freq: Every day | ORAL | Status: DC

## 2013-12-25 MED ORDER — AMLODIPINE BESYLATE 5 MG PO TABS: 5.0000 mg | ORAL_TABLET | Freq: Every day | ORAL | Status: DC

## 2013-12-25 NOTE — Progress Notes (Signed)
12/25/2013 [name]   07/09/1929  [id number]  Primary Physician: Alfredia Ferguson Primary Cardiologist: Dr. [name]  HPI:  The patient is a 80 y/o female, followed by Dr. [name], who presents to clinic today for f/u and for medication refills. She has been out of amlodipine, coreg and digoxin for almost a week now. She presents to clinic with an elevated BP and HR at 170/90  and 118 respectively. She was last seen by Dr. [name] on 05/10/2012. She has a history of diastolic CHF, HTN, DM2, COPD with ongoing tobacco abuse and atrial fibrillation. She was taken off of Coumadin due to age and low body mass. Her last 2D echo was in 2012, demonstrating normal EF at 60-65% and Grade I diastolic dysfunction.   Other than being out of her medications, she presents to clinic with no other concerns or complaints. She denies chest pain, dyspnea, orthopnea, PND, LEE, weight gain, palpitations, dizziness, lightheaded, syncope/ near syncope. She continues to smoke but is making attempts to cut down.   Other than tachycardia, her EKG is negative for any rhythm or ischemic abnormalities.   Current Outpatient Prescriptions  Medication Sig Dispense Refill  . acetaminophen (TYLENOL) 500 MG tablet Take 1,000 mg by mouth every 6 (six) hours as needed for pain.      Marland Kitchen amLODipine (NORVASC) 5 MG tablet TAKE ONE TABLET BY MOUTH ONCE DAILY  30 tablet  0  . aspirin EC 81 MG tablet Take 81 mg by mouth daily.      . benazepril (LOTENSIN) 40 MG tablet Take 1 tablet (40 mg total) by mouth daily.  90 tablet  3  . CALCIUM PO Take 1 tablet by mouth daily.       . carvedilol (COREG) 3.125 MG tablet TAKE 1 TABLET BY MOUTH TWICE DAILY WITH MEALS. NEEDS OFFICE VISIT.  14 tablet  0  . DIGOX 125 MCG tablet TAKE ONE TABLET BY MOUTH ONE TIME DAILY  90 tablet  1  . fluticasone (FLONASE) 50 MCG/ACT nasal spray Place 2 sprays into the nose as needed for rhinitis.      . furosemide (LASIX) 20 MG tablet Take 20 mg by  mouth daily.        Marland Kitchen glimepiride (AMARYL) 2 MG tablet Take 2 mg by mouth daily before breakfast.        . ipratropium-albuterol (DUONEB) 0.5-2.5 (3) MG/3ML SOLN Take 3 mLs by nebulization daily as needed (wheezing).      . Multiple Vitamin (MULTIVITAMIN) tablet Take 1 tablet by mouth daily.        Marland Kitchen POTASSIUM PO Take 1 tablet by mouth daily.       No current facility-administered medications for this visit.    No Known Allergies  History   Social History  . Marital Status: Divorced    Spouse Name: N/A    Number of Children: 1  . Years of Education: N/A   Occupational History  .     Social History Main Topics  . Smoking status: Current Every Day Smoker -- 0.50 packs/day for 64 years    Types: Cigarettes  . Smokeless tobacco: Never Used  . Alcohol Use: No  . Drug Use: No  . Sexual Activity: No   Other Topics Concern  . Not on file   Social History Narrative  . No narrative on file     Review of Systems: General: negative for chills, fever, night sweats or weight changes.  Cardiovascular: negative for  chest pain, dyspnea on exertion, edema, orthopnea, palpitations, paroxysmal nocturnal dyspnea or shortness of breath Dermatological: negative for rash Respiratory: negative for cough or wheezing Urologic: negative for hematuria Abdominal: negative for nausea, vomiting, diarrhea, bright red blood per rectum, melena, or hematemesis Neurologic: negative for visual changes, syncope, or dizziness All other systems reviewed and are otherwise negative except as noted above.    Blood pressure 170/90, pulse 118, height  (1.575 m), weight 91 lb 6.4 oz (41.459 kg), SpO2 98.00%.  General appearance: alert, cooperative, cachectic and no distress Neck: no carotid bruit and no JVD Lungs: clear to auscultation bilaterally Heart: fast rate regular rhthym Pulses: 2+ and symmetric Skin: warm and dry Neurologic: Grossly normal  EKG Sinus tachycardia. HR 118 bpm  ASSESSMENT AND  PLAN:   1. Chronic Diastolic HF: denies dyspnea. euvolemic on physical exam. Will need better BP control to prevent exacerbation. Will reorder meds.  2. HTN: elevated at 170/90. She has been out of amlodipine and Coreg for almost a week. Will reorder today. Discussed importance of daily medication compliance. She also continues to smoke and drinks caffeine daily (had 1 1/2 cups of regular coffee prior to appointment). We discussed eliminating nicotine and cutting back on caffeine consumption.   3. PAF: EKG demonstrates sinus tach, due to being off of Coreg and digoxin. Will reorder meds. No recent h/o palpitations. Anticoagulation was discontinued due to age and low body mass.   4. Tobacco use: smoking cessation was strongly recommended.     PLAN  Will reorder meds. She is also due for a fasting lipid panel but has eaten today. Will have patient return for BP and HR check in 2 weeks with RN and will have patient get a fasting lipid panel, CMP and digoxin level. F/U with Dr. Roseanne Reno in 6 months.   Nasya Vincent, BRITTAINYPA-C 12/25/2013 10:44 AM

## 2013-12-25 NOTE — Patient Instructions (Addendum)
Your physician recommends that you continue on your current medications as directed. Please refer to the Current Medication list given to you today.  Medications were filled today: Amlodipine,  Coreg, Digoxin and [facility] [address].  Your physician wants you to follow-up in: 6 months with Dr. [name]. You will receive a reminder letter in the mail two months in advance. If you don't receive a letter, please call our office to schedule the follow-up appointment.   Your physician recommends that you return for a FASTING lipid profile: lipid/dig/cmp when you come back for nurses visit.  You will come back in two weeks for blood pressure check and heart rate with a nurse for HTN and get fasting labs that day.

## 2014-01-02 DIAGNOSIS — J322 Chronic ethmoidal sinusitis: Secondary | ICD-10-CM | POA: Diagnosis not present

## 2014-01-02 DIAGNOSIS — J0100 Acute maxillary sinusitis, unspecified: Secondary | ICD-10-CM | POA: Diagnosis not present

## 2014-01-02 DIAGNOSIS — J321 Chronic frontal sinusitis: Secondary | ICD-10-CM | POA: Diagnosis not present

## 2014-01-08 ENCOUNTER — Other Ambulatory Visit: Payer: Medicare Other

## 2014-01-10 ENCOUNTER — Other Ambulatory Visit: Payer: Medicare Other

## 2014-06-20 DIAGNOSIS — N08 Glomerular disorders in diseases classified elsewhere: Secondary | ICD-10-CM | POA: Diagnosis not present

## 2014-06-20 DIAGNOSIS — Z79899 Other long term (current) drug therapy: Secondary | ICD-10-CM | POA: Diagnosis not present

## 2014-06-20 DIAGNOSIS — I129 Hypertensive chronic kidney disease with stage 1 through stage 4 chronic kidney disease, or unspecified chronic kidney disease: Secondary | ICD-10-CM | POA: Diagnosis not present

## 2014-06-20 DIAGNOSIS — N183 Chronic kidney disease, stage 3 (moderate): Secondary | ICD-10-CM | POA: Diagnosis not present

## 2014-06-20 DIAGNOSIS — J329 Chronic sinusitis, unspecified: Secondary | ICD-10-CM | POA: Diagnosis not present

## 2014-06-20 DIAGNOSIS — E1165 Type 2 diabetes mellitus with hyperglycemia: Secondary | ICD-10-CM | POA: Diagnosis not present

## 2014-07-07 ENCOUNTER — Ambulatory Visit: Payer: Medicare Other | Admitting: Cardiovascular Disease

## 2014-09-02 ENCOUNTER — Other Ambulatory Visit: Payer: Self-pay | Admitting: Cardiology

## 2014-09-04 ENCOUNTER — Encounter (HOSPITAL_COMMUNITY): Payer: Self-pay | Admitting: *Deleted

## 2014-09-04 ENCOUNTER — Emergency Department (HOSPITAL_COMMUNITY): Payer: Medicare Other

## 2014-09-04 ENCOUNTER — Emergency Department (HOSPITAL_COMMUNITY)
Admission: EM | Admit: 2014-09-04 | Discharge: 2014-09-04 | Disposition: A | Discharge status: Home / Self Care | Payer: Medicare Other | Attending: Emergency Medicine | Admitting: Emergency Medicine

## 2014-09-04 DIAGNOSIS — J449 Chronic obstructive pulmonary disease, unspecified: Secondary | ICD-10-CM | POA: Diagnosis not present

## 2014-09-04 DIAGNOSIS — I2510 Atherosclerotic heart disease of native coronary artery without angina pectoris: Secondary | ICD-10-CM | POA: Diagnosis not present

## 2014-09-04 DIAGNOSIS — I509 Heart failure, unspecified: Secondary | ICD-10-CM | POA: Diagnosis not present

## 2014-09-04 DIAGNOSIS — F329 Major depressive disorder, single episode, unspecified: Secondary | ICD-10-CM | POA: Insufficient documentation

## 2014-09-04 DIAGNOSIS — R0602 Shortness of breath: Secondary | ICD-10-CM | POA: Insufficient documentation

## 2014-09-04 DIAGNOSIS — Z79899 Other long term (current) drug therapy: Secondary | ICD-10-CM | POA: Insufficient documentation

## 2014-09-04 DIAGNOSIS — Z7982 Long term (current) use of aspirin: Secondary | ICD-10-CM | POA: Insufficient documentation

## 2014-09-04 DIAGNOSIS — E119 Type 2 diabetes mellitus without complications: Secondary | ICD-10-CM | POA: Diagnosis not present

## 2014-09-04 DIAGNOSIS — Z720 Tobacco use: Secondary | ICD-10-CM | POA: Insufficient documentation

## 2014-09-04 DIAGNOSIS — R05 Cough: Secondary | ICD-10-CM | POA: Diagnosis not present

## 2014-09-04 DIAGNOSIS — F17210 Nicotine dependence, cigarettes, uncomplicated: Secondary | ICD-10-CM | POA: Diagnosis not present

## 2014-09-04 DIAGNOSIS — N390 Urinary tract infection, site not specified: Secondary | ICD-10-CM | POA: Diagnosis not present

## 2014-09-04 DIAGNOSIS — R0600 Dyspnea, unspecified: Secondary | ICD-10-CM | POA: Diagnosis not present

## 2014-09-04 DIAGNOSIS — Z8673 Personal history of transient ischemic attack (TIA), and cerebral infarction without residual deficits: Secondary | ICD-10-CM | POA: Diagnosis not present

## 2014-09-04 DIAGNOSIS — I10 Essential (primary) hypertension: Secondary | ICD-10-CM | POA: Diagnosis not present

## 2014-09-04 LAB — CBC WITH DIFFERENTIAL/PLATELET
BASOS ABS: 0 10*3/uL (ref 0.0–0.1)
Basophils Relative: 0 % (ref 0–1)
EOS ABS: 0.2 10*3/uL (ref 0.0–0.7)
Eosinophils Relative: 1 % (ref 0–5)
HCT: 45.7 % (ref 36.0–46.0)
Hemoglobin: 15 g/dL (ref 12.0–15.0)
LYMPHS PCT: 16 % (ref 12–46)
Lymphs Abs: 2.5 10*3/uL (ref 0.7–4.0)
MCH: 31.4 pg (ref 26.0–34.0)
MCHC: 32.8 g/dL (ref 30.0–36.0)
MCV: 95.6 fL (ref 78.0–100.0)
Monocytes Absolute: 1 10*3/uL (ref 0.1–1.0)
Monocytes Relative: 7 % (ref 3–12)
Neutro Abs: 11.7 10*3/uL — ABNORMAL HIGH (ref 1.7–7.7)
Neutrophils Relative %: 76 % (ref 43–77)
PLATELETS: 164 10*3/uL (ref 150–400)
RBC: 4.78 MIL/uL (ref 3.87–5.11)
RDW: 14.4 % (ref 11.5–15.5)
WBC: 15.5 10*3/uL — AB (ref 4.0–10.5)

## 2014-09-04 LAB — I-STAT TROPONIN, ED: TROPONIN I, POC: 0.02 ng/mL (ref 0.00–0.08)

## 2014-09-04 LAB — URINALYSIS, ROUTINE W REFLEX MICROSCOPIC
Bilirubin Urine: NEGATIVE
GLUCOSE, UA: NEGATIVE mg/dL
Hgb urine dipstick: NEGATIVE
Ketones, ur: NEGATIVE mg/dL
Nitrite: POSITIVE — AB
PH: 5.5 (ref 5.0–8.0)
Protein, ur: NEGATIVE mg/dL
SPECIFIC GRAVITY, URINE: 1.013 (ref 1.005–1.030)
UROBILINOGEN UA: 1 mg/dL (ref 0.0–1.0)

## 2014-09-04 LAB — BASIC METABOLIC PANEL
ANION GAP: 10 (ref 5–15)
BUN: 19 mg/dL (ref 6–20)
CALCIUM: 9.3 mg/dL (ref 8.9–10.3)
CO2: 30 mmol/L (ref 22–32)
Chloride: 97 mmol/L — ABNORMAL LOW (ref 101–111)
Creatinine, Ser: 1.22 mg/dL — ABNORMAL HIGH (ref 0.44–1.00)
GFR calc Af Amer: 46 mL/min — ABNORMAL LOW (ref >60)
GFR calc non Af Amer: 40 mL/min — ABNORMAL LOW (ref >60)
GLUCOSE: 165 mg/dL — AB (ref 65–99)
Potassium: 4.5 mmol/L (ref 3.5–5.1)
Sodium: 137 mmol/L (ref 135–145)

## 2014-09-04 LAB — D-DIMER, QUANTITATIVE: D-Dimer, Quant: 1.03 ug/mL-FEU — ABNORMAL HIGH (ref 0.00–0.48)

## 2014-09-04 LAB — URINE MICROSCOPIC-ADD ON

## 2014-09-04 LAB — BRAIN NATRIURETIC PEPTIDE: B NATRIURETIC PEPTIDE 5: 55.5 pg/mL (ref 0.0–100.0)

## 2014-09-04 MED ORDER — CEPHALEXIN 500 MG PO CAPS: 500.0000 mg | ORAL_CAPSULE | Freq: Three times a day (TID) | ORAL | Status: DC

## 2014-09-04 MED ORDER — CEPHALEXIN 500 MG PO CAPS: 500.0000 mg | ORAL_CAPSULE | Freq: Four times a day (QID) | ORAL | Status: AC

## 2014-09-04 MED ORDER — ASPIRIN 325 MG PO TABS
325.0000 mg | ORAL_TABLET | Freq: Every day | ORAL | Status: DC
  Filled 2014-09-04: qty 1

## 2014-09-04 MED ORDER — IOHEXOL 350 MG/ML SOLN
75.0000 mL | Freq: Once | INTRAVENOUS | Status: AC | PRN
  Administered 2014-09-04: 75 mL via INTRAVENOUS

## 2014-09-04 MED ORDER — IPRATROPIUM-ALBUTEROL 0.5-2.5 (3) MG/3ML IN SOLN
3.0000 mL | Freq: Once | RESPIRATORY_TRACT | Status: AC
  Administered 2014-09-04: 3 mL via RESPIRATORY_TRACT
  Filled 2014-09-04: qty 3

## 2014-09-04 NOTE — Progress Notes (Signed)
EDCM received phone call from [facility] regarding possible home health services/medication assistance for patient.  EDCM spoke to [facility] who gave patient's daughter the phone so that [facility] could speak to her.  Patient's daughter Jorene Guest [phone] confirms patient lives with her.  Patient does not have any medical equipment at home at this time.  Per, daughter, patient is able to ambulate without an assistive device and is steady on her feet.  Patient is able to complete her ADL's without difficulty.  Patient's daughter confirms patient's pcp is Dr. Morrie Sheldon sanders.  Patient's daughter reports she would prefer the patient stay at home and not go to a facility.  Patient's daughter reports she isn't sure if the patient is taking all of her medications or not taking them correctly.  EDCM explained services of [facility].  Patient's daughter agreeable to [facility] consult.  Patient is not a candidate for home health services at this time.  [facility] faxed printed information regarding [facility] and list of home health agencies in [place] to [place] at [phone] to be given to the patient.  Confirmation of receipt of fax at 1825pm.  EDRN made aware of faxed resources.  [facility] consult placed.  No further EDCM needs at this time.

## 2014-09-04 NOTE — ED Notes (Signed)
Pt transporting to CT at this time.  

## 2014-09-04 NOTE — ED Notes (Signed)
Pt transported to xray at this time

## 2014-09-04 NOTE — ED Notes (Signed)
Pt from home via [facility] with acute onset sob after walking upstairs.  Per EMS, use of accessory muscles.  96% on RA with rr of 30.  EKG showed RRB with occ pvc's.   Pt denied chest pain.  PT walked to stretcher.  Hypertensive 204/112.

## 2014-09-04 NOTE — ED Provider Notes (Addendum)
  Physical Exam  BP 150/73 mmHg  Pulse 112  Temp(Src) 98.4 F (36.9 C) (Oral)  Resp 23  Wt 90 lb (40.824 kg)  SpO2 96%  Physical Exam  ED Course  Procedures  MDM  4:40 PM Pt care from Dr. Rush Landmark. 81 year old female history of CHF ACS strokes comes in she complained of sudden onset shortness of breath this morning. Have been normal state of health. His hemoglobin was stable here. Satting 95% or better on room air. States her shortness breath has improved. Patient will have significant for a d-dimer which is slightly positive. We'll obtain CT chest. Disposition plan if CT normal will DC home if clinically continues to do well. Discussed this plan with patient. Awaiting CT  6:19 PM   CTA chest performed with no signs of pulmonary embolism or other significant abnormalities. Reevaluation of patient she continues to do well on room air at 96% saturations are better. Remainder vital signs appropriate. Patient is able to ambulate tolerate by mouth without issue. Unclear etiology for patient shortness of breath this morning. However has resolved at this time. No red flag symptoms on exam. I have discussed return precautions with patient and family who voiced understanding. They'll follow up with her PCP. Specifically follow up with PCP regarding slight elevation of Cr to 1.22.    7:58 PM Prior to discharge patient's family did mention that she had some pain with urination several days ago. Given this we did obtain a UA which showed signs of a UTI. She is afebrile. Her vital signs are normal. There is no CVA tenderness palpation. Patient likely with a uncomplicated UTI. We'll provide prescription for outpatient Vantin. Recommend follow up with PCP as discussed in next several days.    Bridgett Larsson, MD 09/04/14 Harrietta Guardian  Dione Booze, MD 09/04/14 2952  Bridgett Larsson, MD 09/04/14 8413  Toy Cookey, MD 09/05/14 1725

## 2014-09-04 NOTE — Discharge Instructions (Signed)

## 2014-09-04 NOTE — ED Provider Notes (Signed)
CSN: [id number]     Arrival date & time 09/04/14  1313 History   First MD Initiated Contact with Patient 09/04/14 1316     Chief Complaint  Patient presents with  . Shortness of Breath   [name] is a 81 y.o. female with a past medical history significant for CHF, coronary artery disease, stroke, diabetes, hypertension who presents with shortness of breath. The patient currently by her daughter who reports that the patient had a relatively normal morning when she takes. Sudden shortness of breath. The patient reports that she had a big breakfast, took her morning bath, and then while sitting on her bed, developed shortness of breath. The patient denied chest pain, nausea, vomiting, lightheadedness, presyncope, dysuria, constipation, or diarrhea. The patient says that she has not had these symptoms before. The patient reports that her shortness of breath has eased off after arriving to the emergency department.   (Consider location/radiation/quality/duration/timing/severity/associated sxs/prior Treatment) Patient is a 81 y.o. female presenting with shortness of breath. The history is provided by the patient. No language interpreter was used.  Shortness of Breath Severity:  Moderate Onset quality:  Sudden Duration:  3 hours Timing:  Constant Progression:  Improving Chronicity:  New Context: not activity   Relieved by:  Nothing Worsened by:  Nothing tried Ineffective treatments:  None tried Associated symptoms: cough   Associated symptoms: no abdominal pain, no chest pain, no diaphoresis, no fever, no headaches, no hemoptysis, no rash, no sputum production, no vomiting and no wheezing   Cough:    Cough characteristics:  Non-productive   Severity:  Moderate   Onset quality:  Gradual   Duration:  1 day   Timing:  Intermittent   Progression:  Waxing and waning   Chronicity:  New Risk factors: no hx of PE/DVT     Past Medical History  Diagnosis Date  . Cardiomyopathy    non-ischemic 04/2008 EF 45% cardiac MRI no scar  . Hypertension   . Hypercholesterolemia   . CAD (coronary artery disease)   . CHF (congestive heart failure)   . Dyspnea on exertion     chronic  . Type II diabetes mellitus   . Sinus headache     "chronic"  . Stroke 1997    "mild"  . Chronic sinusitis   . Depression    Past Surgical History  Procedure Laterality Date  . Carotid angiogram      2006  . Esophagogastroduodenoscopy      with foreign body removal  . Nasal sinus surgery  ~1974;~ 1976  . Tonsillectomy      "when I was a chld"   Family History  Problem Relation Age of Onset  . Coronary artery disease      family history   History  Substance Use Topics  . Smoking status: Current Every Day Smoker -- 0.50 packs/day for 64 years    Types: Cigarettes  . Smokeless tobacco: Never Used  . Alcohol Use: No   OB History    No data available     Review of Systems  Constitutional: Negative for fever, chills and diaphoresis.  HENT: Negative for congestion and rhinorrhea.   Eyes: Negative for visual disturbance.  Respiratory: Positive for cough and shortness of breath. Negative for hemoptysis, sputum production, choking, chest tightness, wheezing and stridor.   Cardiovascular: Negative for chest pain.  Gastrointestinal: Negative for nausea, vomiting, abdominal pain, diarrhea and constipation.  Genitourinary: Negative for dysuria and flank pain.  Musculoskeletal: Negative for back  pain and neck stiffness.  Skin: Negative for rash and wound.  Neurological: Negative for weakness and headaches.  Psychiatric/Behavioral: Negative for confusion.  All other systems reviewed and are negative.     Allergies  Review of patient's allergies indicates no known allergies.  Home Medications   Prior to Admission medications   Medication Sig Start Date End Date Taking? Authorizing Provider  acetaminophen (TYLENOL) 500 MG tablet Take 1,000 mg by mouth every 6 (six) hours as  needed for pain.    Historical Provider, MD  amLODipine (NORVASC) 5 MG tablet Take 1 tablet (5 mg total) by mouth daily. 12/25/13   Brittainy Sherlynn Carbon, PA-C  aspirin EC 81 MG tablet Take 81 mg by mouth daily.    Historical Provider, MD  benazepril (LOTENSIN) 40 MG tablet Take 1 tablet (40 mg total) by mouth daily. 05/29/12   Wendall Stade, MD  CALCIUM PO Take 1 tablet by mouth daily.     Historical Provider, MD  carvedilol (COREG) 3.125 MG tablet TAKE 1 TABLET BY MOUTH TWICE DAILY WITH A MEAL 09/03/14   Wendall Stade, MD  digoxin (DIGOX) 0.125 MG tablet Take 1 tablet (0.125 mg total) by mouth daily. 12/25/13   Brittainy Sherlynn Carbon, PA-C  fluticasone (FLONASE) 50 MCG/ACT nasal spray Place 2 sprays into the nose as needed for rhinitis.    Historical Provider, MD  furosemide (LASIX) 20 MG tablet Take 20 mg by mouth daily.      Historical Provider, MD  glimepiride (AMARYL) 2 MG tablet Take 2 mg by mouth daily before breakfast.      Historical Provider, MD  ipratropium-albuterol (DUONEB) 0.5-2.5 (3) MG/3ML SOLN Take 3 mLs by nebulization daily as needed (wheezing).    Historical Provider, MD  Multiple Vitamin (MULTIVITAMIN) tablet Take 1 tablet by mouth daily.      Historical Provider, MD  POTASSIUM PO Take 1 tablet by mouth daily.    Historical Provider, MD   BP 162/73 mmHg  Pulse 99  Temp(Src) 98.4 F (36.9 C) (Oral)  Resp 16  Wt 92 lb (41.731 kg)  SpO2 94% Physical Exam  Constitutional: She is oriented to person, place, and time. She appears well-developed and well-nourished. No distress.  HENT:  Head: Normocephalic.  Mouth/Throat: No oropharyngeal exudate.  Eyes: Conjunctivae are normal. Pupils are equal, round, and reactive to light.  Neck: Normal range of motion.  Cardiovascular: Normal rate, regular rhythm and normal heart sounds.   No murmur heard. Pulmonary/Chest: Effort normal and breath sounds normal. No stridor. No respiratory distress. She has no wheezes. She has no rales. She  exhibits no tenderness.  Abdominal: Soft. She exhibits no distension and no mass. There is no tenderness. There is no rebound.  Musculoskeletal: Normal range of motion.  Neurological: She is alert and oriented to person, place, and time. She displays normal reflexes. She exhibits normal muscle tone.  Skin: Skin is warm. She is not diaphoretic. No pallor.  Psychiatric: She has a normal mood and affect.  Nursing note and vitals reviewed.   ED Course  Procedures (including critical care time) Labs Review Labs Reviewed  CBC WITH DIFFERENTIAL/PLATELET - Abnormal; Notable for the following:    WBC 15.5 (*)    Neutro Abs 11.7 (*)    All other components within normal limits  D-DIMER, QUANTITATIVE - Abnormal; Notable for the following:    D-Dimer, Quant 1.03 (*)    All other components within normal limits  BASIC METABOLIC PANEL - Abnormal; Notable for  the following:    Chloride 97 (*)    Glucose, Bld 165 (*)    Creatinine, Ser 1.22 (*)    GFR calc non Af Amer 40 (*)    GFR calc Af Amer 46 (*)    All other components within normal limits  BRAIN NATRIURETIC PEPTIDE  I-STAT TROPOININ, ED    Imaging Review Dg Chest 2 View  09/04/2014   CLINICAL DATA:  Short of breath  EXAM: CHEST  2 VIEW  COMPARISON:  12/25/2012  FINDINGS: COPD with pulmonary hyperinflation. Negative for pneumonia. Negative for heart failure or effusion. Lungs remain clear.  IMPRESSION: No active cardiopulmonary disease.   Electronically Signed   By: Marlan Palau M.D.   On: 09/04/2014 14:16     EKG Interpretation   Date/Time:  Thursday Sep 04 2014 14:25:27 EDT Ventricular Rate:  88 PR Interval:  145 QRS Duration: 148 QT Interval:  430 QTC Calculation: 520 R Axis:   -38 Text Interpretation:  Sinus rhythm Ventricular premature complex Right  bundle branch block which is new Confirmed by DOCHERTY  MD, MEGAN [phone])  on 09/04/2014 4:23:06 PM      MDM   Final diagnoses:  None    [name] is a 81 y.o.  female with a past medical history significant for CHF, coronary artery disease, stroke, diabetes, hypertension who presents with shortness of breath. Given the patient's past medical history, initial differential diagnosis includes CHF exacerbation, ACS, pulmonary embolism, pneumonia. As the patient denies lower extremity edema, and her lungs did not sound fluid-filled, CHF exacerbation is less likely. The patient's initial BNP was normal. The patient was given 325 mg of aspirin while her workup was initiated. The patient's EKG appeared similar to prior. The patient's initial troponin was negative. Given the sudden onset of her shortness of breath this morning, pulmonary embolism was also considered. The patient had a d-dimer which was elevated. The patient will have a CT scan to look for pulmonary embolism.  The patient reported subjective improvement in her shortness of breath while waiting in the emergency department. The patient was given a DuoNeb treatment which she thinks helped her breathing as well.  The patient's vital signs remained stable during her workup in the emergency department.  The patient's care was transferred to Dr. post at 4:30 PM. Please see his note for further workup and management of this patient in the emergency department. The patient's care was transferred in stable condition.  This patient was seen with Dr. Micheline Maze, emergency medicine attending.   [name], MD 09/04/14 1651  Toy Cookey, MD 09/05/14 [phone]

## 2014-09-08 ENCOUNTER — Other Ambulatory Visit: Payer: Self-pay | Admitting: *Deleted

## 2014-09-08 NOTE — Patient Outreach (Signed)
Telephone call (f/u on referral from [facility] ED, recent ED visit).   Spoke with pt, HIPPA verified.  RN CM discussed with pt referral from [facility] RN CM related to recent ED visit).  Pt states doing fair, sob under control, uses breathing machine as needed. Pt states her bedroom is upstairs, little sob when going up.  Pt states lives with daughter, daughter has her own business, home most of the time and if out of town, granddaughter stays with her.   UTI- pt states taking antibiotic, started 5/12, urine not burning, clearing up well.  HF- pt states no chest pain, breathing improved, weigh every other day, staying the same 88 lbs, not gaining.  Pt states appetite is getting better.  Pt states she is taking all of her medications as directed, granddaughter provided pill box, talk about setting up this weekend when she comes.   RN CM inquired about her BP to which pt states usually does not have problems, granddaughter (nurse) checks, ranges 120-130/70.   Pt states her biggest concern is sinus infection,congested in head, has not called MD, hoping antibiotic will help that too.  RN CM discussed with pt if no improvement, to call MD to which pt agreed.   RN CM discussed [facility] services which include home visit from a nurse to which pt agreed.  Home visit scheduled for 5/19.    Note- RN CM unable to pt's review medications at this time- pt states medications are in cabinet downstairs, currently upstairs, to review during home visit 5/19.  Shayne Alken.   Lennart Gladish RN CCM [facility] Care Management  [phone]

## 2014-09-11 ENCOUNTER — Encounter: Payer: Self-pay | Admitting: *Deleted

## 2014-09-11 ENCOUNTER — Other Ambulatory Visit: Payer: Self-pay | Admitting: *Deleted

## 2014-09-11 VITALS — BP 172/80 | HR 72 | Resp 20 | Ht 62.0 in | Wt 83.0 lb

## 2014-09-11 DIAGNOSIS — I10 Essential (primary) hypertension: Secondary | ICD-10-CM

## 2014-09-12 ENCOUNTER — Encounter: Payer: Self-pay | Admitting: *Deleted

## 2014-09-12 NOTE — Patient Outreach (Addendum)
[facility]) Care Management   09/11/2014  [name] 1929-07-18 [id number]  [name] is an 81 y.o. female  Subjective: Pt reports doing good since recent ED visit, no sob whereas before ED visit had difficulty Walking up the stairs.    Pt states she still has not gained weight back, was 92 lbs before ED visit, now 83 lbs.  Pt states she has 3 meals a day and Boost, had bacon/2 eggs/toast this am.    Pt states her sugars run 90-140, been  a diabetic for 11 years.   Pt states her children call her every day, lives with her daughter.   Pt states she see Dr. Theodoro Clock for her heart, to f/u 6/15.    Objective:  Lungs clear.  Thin- weight today 83 lbs.  Filed Vitals:   09/11/14 1437  BP: 172/80  Pulse: 72  Resp: 20   ROS  Physical Exam  Constitutional: She is oriented to person, place, and time.  Cardiovascular: Regular rhythm.   Respiratory: Breath sounds normal.  Musculoskeletal: Normal range of motion.  Neurological: She is alert and oriented to person, place, and time.  Skin: Skin is dry.  Hands cold.   Psychiatric: She has a normal mood and affect. Her behavior is normal.    Current Medications:   Current Outpatient Prescriptions  Medication Sig Dispense Refill  . acetaminophen (TYLENOL) 500 MG tablet Take 1,000 mg by mouth every 6 (six) hours as needed for pain.    Marland Kitchen amLODipine (NORVASC) 5 MG tablet Take 1 tablet (5 mg total) by mouth daily. 30 tablet 6  . Ascorbic Acid (VITAMIN C PO) Take 1 tablet by mouth daily.    Marland Kitchen aspirin EC 81 MG tablet Take 81 mg by mouth daily.    Marland Kitchen atorvastatin (LIPITOR) 10 MG tablet Take 10 mg by mouth daily.    . benazepril (LOTENSIN) 40 MG tablet Take 1 tablet (40 mg total) by mouth daily. 90 tablet 3  . CALCIUM PO Take 1 tablet by mouth daily.     . carvedilol (COREG) 3.125 MG tablet TAKE 1 TABLET BY MOUTH TWICE DAILY WITH A MEAL 60 tablet 6  . digoxin (DIGOX) 0.125 MG tablet Take 1 tablet (0.125 mg total) by mouth  daily. 30 tablet 6  . fluticasone (FLONASE) 50 MCG/ACT nasal spray Place 2 sprays into the nose as needed for rhinitis.    . furosemide (LASIX) 20 MG tablet Take 20 mg by mouth daily.      Marland Kitchen glimepiride (AMARYL) 2 MG tablet Take 2 mg by mouth daily before breakfast.      . ipratropium-albuterol (DUONEB) 0.5-2.5 (3) MG/3ML SOLN Take 3 mLs by nebulization daily as needed (wheezing).    Marland Kitchen loratadine (CLARITIN) 10 MG tablet Take 10 mg by mouth daily as needed for allergies.    . mirtazapine (REMERON) 15 MG tablet Take 15 mg by mouth at bedtime.  3  . Multiple Vitamin (MULTIVITAMIN) tablet Take 1 tablet by mouth 2 (two) times a week.     Marland Kitchen POTASSIUM PO Take 1 tablet by mouth daily.     No current facility-administered medications for this visit.    Functional Status:   In your present state of health, do you have any difficulty performing the following activities: 09/11/2014  Hearing? N  Vision? N  Difficulty concentrating or making decisions? N  Walking or climbing stairs? N  Dressing or bathing? N  Doing errands, shopping? Y  [employer]arry manager  and eating ? N  Using the Toilet? N  In the past six months, have you accidently leaked urine? Y  Do you have problems with loss of bowel control? N  Managing your Medications? Y  Managing your Finances? N  Housekeeping or managing your Housekeeping? N    Fall/Depression Screening:  Pt reports no falls in the last year, uses no assistive device.  PHQ 2/9 Scores 09/11/2014  PHQ - 2 Score 1    Assessment:   Pt thin, underweight, reports not gaining her weight back- good appetite.   HTN- BP elevated today, 172/80- pt had 2 slices of bacon, 2 eggs, toast for breakfast.                             Plan:  Pt to call Dr. Allyne Gee office, request f/u on recent ED visit (UTI).            Pt or granddaughter to check  BP 5 days a week, record [facility] calendar provided to record), call MD if                   Elevated.             HF- pt to start weighing  every other day            Pt to add protein to snack - help gain weight.             Plan to provide pt with community nurse case management services, next home visit 6/17.            Plan to update care plan as needed.             Plan to inform Dr. Allyne Gee of [facility] involvement (RN CM services).   Shayne Alken.   Zeshan Sena RN CCM [facility] Care Management  [phone]

## 2014-09-29 ENCOUNTER — Encounter (HOSPITAL_COMMUNITY): Payer: Self-pay | Admitting: *Deleted

## 2014-09-29 ENCOUNTER — Emergency Department (HOSPITAL_COMMUNITY)
Admission: EM | Admit: 2014-09-29 | Discharge: 2014-09-29 | Disposition: A | Discharge status: Home / Self Care | Payer: Medicare Other | Attending: Emergency Medicine | Admitting: Emergency Medicine

## 2014-09-29 ENCOUNTER — Emergency Department (HOSPITAL_COMMUNITY): Payer: Medicare Other

## 2014-09-29 DIAGNOSIS — R0602 Shortness of breath: Secondary | ICD-10-CM | POA: Diagnosis not present

## 2014-09-29 DIAGNOSIS — I10 Essential (primary) hypertension: Secondary | ICD-10-CM | POA: Insufficient documentation

## 2014-09-29 DIAGNOSIS — Z8673 Personal history of transient ischemic attack (TIA), and cerebral infarction without residual deficits: Secondary | ICD-10-CM | POA: Diagnosis not present

## 2014-09-29 DIAGNOSIS — Z720 Tobacco use: Secondary | ICD-10-CM | POA: Diagnosis not present

## 2014-09-29 DIAGNOSIS — J40 Bronchitis, not specified as acute or chronic: Secondary | ICD-10-CM | POA: Diagnosis not present

## 2014-09-29 DIAGNOSIS — J441 Chronic obstructive pulmonary disease with (acute) exacerbation: Secondary | ICD-10-CM | POA: Diagnosis not present

## 2014-09-29 DIAGNOSIS — I2510 Atherosclerotic heart disease of native coronary artery without angina pectoris: Secondary | ICD-10-CM | POA: Diagnosis not present

## 2014-09-29 DIAGNOSIS — R05 Cough: Secondary | ICD-10-CM | POA: Diagnosis not present

## 2014-09-29 DIAGNOSIS — E780 Pure hypercholesterolemia: Secondary | ICD-10-CM | POA: Insufficient documentation

## 2014-09-29 DIAGNOSIS — Z79899 Other long term (current) drug therapy: Secondary | ICD-10-CM | POA: Diagnosis not present

## 2014-09-29 DIAGNOSIS — I509 Heart failure, unspecified: Secondary | ICD-10-CM | POA: Insufficient documentation

## 2014-09-29 DIAGNOSIS — F17210 Nicotine dependence, cigarettes, uncomplicated: Secondary | ICD-10-CM | POA: Diagnosis not present

## 2014-09-29 DIAGNOSIS — F329 Major depressive disorder, single episode, unspecified: Secondary | ICD-10-CM | POA: Diagnosis not present

## 2014-09-29 DIAGNOSIS — R509 Fever, unspecified: Secondary | ICD-10-CM | POA: Diagnosis present

## 2014-09-29 DIAGNOSIS — E119 Type 2 diabetes mellitus without complications: Secondary | ICD-10-CM | POA: Insufficient documentation

## 2014-09-29 DIAGNOSIS — Z7951 Long term (current) use of inhaled steroids: Secondary | ICD-10-CM | POA: Diagnosis not present

## 2014-09-29 DIAGNOSIS — J449 Chronic obstructive pulmonary disease, unspecified: Secondary | ICD-10-CM | POA: Diagnosis not present

## 2014-09-29 DIAGNOSIS — Z7982 Long term (current) use of aspirin: Secondary | ICD-10-CM | POA: Insufficient documentation

## 2014-09-29 LAB — CBC WITH DIFFERENTIAL/PLATELET
BASOS ABS: 0 10*3/uL (ref 0.0–0.1)
BASOS PCT: 0 % (ref 0–1)
EOS ABS: 0.3 10*3/uL (ref 0.0–0.7)
Eosinophils Relative: 3 % (ref 0–5)
HCT: 41.5 % (ref 36.0–46.0)
Hemoglobin: 13.8 g/dL (ref 12.0–15.0)
LYMPHS ABS: 3.6 10*3/uL (ref 0.7–4.0)
Lymphocytes Relative: 35 % (ref 12–46)
MCH: 32 pg (ref 26.0–34.0)
MCHC: 33.3 g/dL (ref 30.0–36.0)
MCV: 96.3 fL (ref 78.0–100.0)
MONO ABS: 0.8 10*3/uL (ref 0.1–1.0)
Monocytes Relative: 8 % (ref 3–12)
NEUTROS ABS: 5.6 10*3/uL (ref 1.7–7.7)
NEUTROS PCT: 54 % (ref 43–77)
PLATELETS: 207 10*3/uL (ref 150–400)
RBC: 4.31 MIL/uL (ref 3.87–5.11)
RDW: 13.4 % (ref 11.5–15.5)
WBC: 10.2 10*3/uL (ref 4.0–10.5)

## 2014-09-29 LAB — COMPREHENSIVE METABOLIC PANEL
ALK PHOS: 71 U/L (ref 38–126)
ALT: 34 U/L (ref 14–54)
AST: 26 U/L (ref 15–41)
Albumin: 4.2 g/dL (ref 3.5–5.0)
Anion gap: 9 (ref 5–15)
BILIRUBIN TOTAL: 0.4 mg/dL (ref 0.3–1.2)
BUN: 11 mg/dL (ref 6–20)
CO2: 28 mmol/L (ref 22–32)
CREATININE: 0.82 mg/dL (ref 0.44–1.00)
Calcium: 9.7 mg/dL (ref 8.9–10.3)
Chloride: 100 mmol/L — ABNORMAL LOW (ref 101–111)
GLUCOSE: 200 mg/dL — AB (ref 65–99)
POTASSIUM: 4.1 mmol/L (ref 3.5–5.1)
Sodium: 137 mmol/L (ref 135–145)
Total Protein: 8.2 g/dL — ABNORMAL HIGH (ref 6.5–8.1)

## 2014-09-29 LAB — I-STAT TROPONIN, ED: TROPONIN I, POC: 0.01 ng/mL (ref 0.00–0.08)

## 2014-09-29 LAB — BRAIN NATRIURETIC PEPTIDE: B Natriuretic Peptide: 40.3 pg/mL (ref 0.0–100.0)

## 2014-09-29 MED ORDER — PREDNISONE 10 MG PO TABS: 40.0000 mg | ORAL_TABLET | Freq: Every day | ORAL | Status: DC

## 2014-09-29 MED ORDER — IPRATROPIUM-ALBUTEROL 0.5-2.5 (3) MG/3ML IN SOLN
3.0000 mL | Freq: Once | RESPIRATORY_TRACT | Status: AC
  Administered 2014-09-29: 3 mL via RESPIRATORY_TRACT
  Filled 2014-09-29: qty 3

## 2014-09-29 MED ORDER — PREDNISONE 20 MG PO TABS
60.0000 mg | ORAL_TABLET | Freq: Once | ORAL | Status: AC
  Administered 2014-09-29: 60 mg via ORAL
  Filled 2014-09-29: qty 3

## 2014-09-29 MED ORDER — DM-GUAIFENESIN ER 30-600 MG PO TB12: 1.0000 | ORAL_TABLET | Freq: Two times a day (BID) | ORAL | Status: DC

## 2014-09-29 NOTE — ED Provider Notes (Signed)
CSN: [id number]     Arrival date & time 09/29/14  1405 History   First MD Initiated Contact with Patient 09/29/14 1921     Chief Complaint  Patient presents with  . Shortness of Breath  . Cough  . Fever     (Consider location/radiation/quality/duration/timing/severity/associated sxs/prior Treatment) Patient is a 81 y.o. female presenting with shortness of breath, cough, and fever. The history is provided by the patient.  Shortness of Breath Associated symptoms: cough and wheezing   Associated symptoms: no abdominal pain, no chest pain, no fever, no headaches and no rash   Cough Associated symptoms: shortness of breath and wheezing   Associated symptoms: no chest pain, no fever, no headaches and no rash   Fever Associated symptoms: cough   Associated symptoms: no chest pain, no confusion, no congestion, no dysuria, no headaches and no rash    patient with a known history of COPD. Patient with difficulty with cough and shortness of breath reported a fever however no fevers here. Patient concerned about having nasal congestion and possible pneumonia. Patient uses her albuterol nebulizer at home is currently not on sterile Rolaids. Patient also has an albuterol inhaler.  Past Medical History  Diagnosis Date  . Cardiomyopathy     non-ischemic 04/2008 EF 45% cardiac MRI no scar  . Hypertension   . Hypercholesterolemia   . CAD (coronary artery disease)   . CHF (congestive heart failure)   . Dyspnea on exertion     chronic  . Type II diabetes mellitus   . Sinus headache     "chronic"  . Stroke 1997    "mild"  . Chronic sinusitis   . Depression    Past Surgical History  Procedure Laterality Date  . Carotid angiogram      2006  . Esophagogastroduodenoscopy      with foreign body removal  . Nasal sinus surgery  ~1974;~ 1976  . Tonsillectomy      "when I was a chld"   Family History  Problem Relation Age of Onset  . Coronary artery disease      family history  . Heart  disease Father   . Alcohol abuse Sister   . Alcohol abuse Brother    History  Substance Use Topics  . Smoking status: Current Every Day Smoker -- 0.50 packs/day for 64 years    Types: Cigarettes  . Smokeless tobacco: Never Used  . Alcohol Use: No   OB History    No data available     Review of Systems  Constitutional: Negative for fever.  HENT: Negative for congestion.   Eyes: Negative for redness.  Respiratory: Positive for cough, shortness of breath and wheezing.   Cardiovascular: Negative for chest pain.  Gastrointestinal: Negative for abdominal pain.  Genitourinary: Negative for dysuria.  Musculoskeletal: Negative for back pain.  Skin: Negative for rash.  Neurological: Negative for headaches.  Hematological: Does not bruise/bleed easily.  Psychiatric/Behavioral: Negative for confusion.      Allergies  Review of patient's allergies indicates no known allergies.  Home Medications   Prior to Admission medications   Medication Sig Start Date End Date Taking? Authorizing Provider  acetaminophen (TYLENOL) 500 MG tablet Take 1,000 mg by mouth every 6 (six) hours as needed for pain.   Yes Historical Provider, MD  albuterol (PROVENTIL HFA;VENTOLIN HFA) 108 (90 BASE) MCG/ACT inhaler Inhale 1-2 puffs into the lungs every 6 (six) hours as needed for wheezing or shortness of breath.   Yes Historical Provider,  MD  amLODipine (NORVASC) 5 MG tablet Take 1 tablet (5 mg total) by mouth daily. 12/25/13  Yes Brittainy Sherlynn Carbon, PA-C  Ascorbic Acid (VITAMIN C PO) Take 1 tablet by mouth daily.   Yes Historical Provider, MD  aspirin EC 81 MG tablet Take 81 mg by mouth daily.   Yes Historical Provider, MD  atorvastatin (LIPITOR) 10 MG tablet Take 10 mg by mouth daily at 6 PM.  09/01/14  Yes Historical Provider, MD  benazepril (LOTENSIN) 40 MG tablet Take 1 tablet (40 mg total) by mouth daily. 05/29/12  Yes Wendall Stade, MD  CALCIUM PO Take 1 tablet by mouth daily.    Yes Historical Provider,  MD  carvedilol (COREG) 3.125 MG tablet TAKE 1 TABLET BY MOUTH TWICE DAILY WITH A MEAL 09/03/14  Yes Wendall Stade, MD  Cholecalciferol (VITAMIN D PO) Take 1 tablet by mouth daily.   Yes Historical Provider, MD  digoxin (DIGOX) 0.125 MG tablet Take 1 tablet (0.125 mg total) by mouth daily. 12/25/13  Yes Brittainy Sherlynn Carbon, PA-C  fluticasone (FLONASE) 50 MCG/ACT nasal spray Place 2 sprays into the nose as needed for rhinitis.   Yes Historical Provider, MD  furosemide (LASIX) 20 MG tablet Take 20 mg by mouth daily.     Yes Historical Provider, MD  glimepiride (AMARYL) 2 MG tablet Take 2 mg by mouth daily before breakfast.     Yes Historical Provider, MD  ipratropium-albuterol (DUONEB) 0.5-2.5 (3) MG/3ML SOLN Take 3 mLs by nebulization daily as needed (wheezing).   Yes Historical Provider, MD  loratadine (CLARITIN) 10 MG tablet Take 10 mg by mouth daily as needed for allergies.   Yes Historical Provider, MD  mirtazapine (REMERON) 15 MG tablet Take 15 mg by mouth at bedtime. 08/13/14  Yes Historical Provider, MD  Multiple Vitamin (MULTIVITAMIN) tablet Take 1 tablet by mouth 2 (two) times a week.    Yes Historical Provider, MD  POTASSIUM PO Take 1 tablet by mouth daily.   Yes Historical Provider, MD  dextromethorphan-guaiFENesin (MUCINEX DM) 30-600 MG per 12 hr tablet Take 1 tablet by mouth 2 (two) times daily. 09/29/14   Vanetta Mulders, MD  predniSONE (DELTASONE) 10 MG tablet Take 4 tablets (40 mg total) by mouth daily. 09/29/14   Vanetta Mulders, MD   BP 160/60 mmHg  Pulse 69  Temp(Src) 98.1 F (36.7 C) (Oral)  Resp 21  SpO2 95% Physical Exam  Constitutional: She is oriented to person, place, and time. She appears well-developed and well-nourished. No distress.  HENT:  Head: Normocephalic and atraumatic.  Mouth/Throat: Oropharynx is clear and moist. No oropharyngeal exudate.  Eyes: Conjunctivae and EOM are normal. Pupils are equal, round, and reactive to light.  Neck: Normal range of motion.   Cardiovascular: Normal rate, regular rhythm and normal heart sounds.   No murmur heard. Pulmonary/Chest: Effort normal. No respiratory distress. She has wheezes.  Abdominal: Soft. Bowel sounds are normal. There is no tenderness.  Musculoskeletal: Normal range of motion.  Neurological: She is oriented to person, place, and time. No cranial nerve deficit. She exhibits normal muscle tone. Coordination normal.  Skin: Skin is warm. No rash noted.  Nursing note and vitals reviewed.   ED Course  Procedures (including critical care time) Labs Review Labs Reviewed  COMPREHENSIVE METABOLIC PANEL - Abnormal; Notable for the following:    Chloride 100 (*)    Glucose, Bld 200 (*)    Total Protein 8.2 (*)    All other components within normal  limits  BRAIN NATRIURETIC PEPTIDE  CBC WITH DIFFERENTIAL/PLATELET  I-STAT TROPOININ, ED   Results for orders placed or performed during the hospital encounter of 09/29/14  BNP (order ONLY if patient complains of dyspnea/SOB AND you have documented it for THIS visit)  Result Value Ref Range   B Natriuretic Peptide 40.3 0.0 - 100.0 pg/mL  CBC with Differential  Result Value Ref Range   WBC 10.2 4.0 - 10.5 K/uL   RBC 4.31 3.87 - 5.11 MIL/uL   Hemoglobin 13.8 12.0 - 15.0 g/dL   HCT 02.1 11.5 - 52.0 %   MCV 96.3 78.0 - 100.0 fL   MCH 32.0 26.0 - 34.0 pg   MCHC 33.3 30.0 - 36.0 g/dL   RDW 80.2 23.3 - 61.2 %   Platelets 207 150 - 400 K/uL   Neutrophils Relative % 54 43 - 77 %   Neutro Abs 5.6 1.7 - 7.7 K/uL   Lymphocytes Relative 35 12 - 46 %   Lymphs Abs 3.6 0.7 - 4.0 K/uL   Monocytes Relative 8 3 - 12 %   Monocytes Absolute 0.8 0.1 - 1.0 K/uL   Eosinophils Relative 3 0 - 5 %   Eosinophils Absolute 0.3 0.0 - 0.7 K/uL   Basophils Relative 0 0 - 1 %   Basophils Absolute 0.0 0.0 - 0.1 K/uL  Comprehensive metabolic panel  Result Value Ref Range   Sodium 137 135 - 145 mmol/L   Potassium 4.1 3.5 - 5.1 mmol/L   Chloride 100 (L) 101 - 111 mmol/L   CO2  28 22 - 32 mmol/L   Glucose, Bld 200 (H) 65 - 99 mg/dL   BUN 11 6 - 20 mg/dL   Creatinine, Ser 2.44 0.44 - 1.00 mg/dL   Calcium 9.7 8.9 - 97.5 mg/dL   Total Protein 8.2 (H) 6.5 - 8.1 g/dL   Albumin 4.2 3.5 - 5.0 g/dL   AST 26 15 - 41 U/L   ALT 34 14 - 54 U/L   Alkaline Phosphatase 71 38 - 126 U/L   Total Bilirubin 0.4 0.3 - 1.2 mg/dL   GFR calc non Af Amer >60 >60 mL/min   GFR calc Af Amer >60 >60 mL/min   Anion gap 9 5 - 15  I-stat troponin, ED  (not at [facility])  Result Value Ref Range   Troponin i, poc 0.01 0.00 - 0.08 ng/mL   Comment 3             Imaging Review Dg Chest 2 View  09/29/2014   CLINICAL DATA:  Shortness of breath and cough for 1 day  EXAM: CHEST - 2 VIEW  COMPARISON:  09/04/2014  FINDINGS: The lungs are again hyperinflated but stable. The cardiac shadow is within normal limits. Aortic calcifications are seen without aneurysmal dilatation. No acute bony abnormality is noted.  IMPRESSION: COPD without acute abnormality.   Electronically Signed   By: Alcide Clever M.D.   On: 09/29/2014 15:23     EKG Interpretation   Date/Time:  Monday September 29 2014 15:00:42 EDT Ventricular Rate:  89 PR Interval:  156 QRS Duration: 86 QT Interval:  354 QTC Calculation: 430 R Axis:   39 Text Interpretation:  Sinus rhythm with occasional Premature ventricular  complexes Possible Anterior infarct , age undetermined Abnormal ECG  Confirmed by Ailana Cuadrado  MD, Benicio Manna [phone]) on 09/29/2014 7:52:59 PM      MDM   Final diagnoses:  Bronchitis  Chronic obstructive pulmonary disease with acute exacerbation  Patient with an exacerbation of her COPD and predominantly bronchitis. Chest x-ray negative for pneumonia. Patient without any wheezing at discharge. Patient received the albuterol Atrovent nebulizer here with some improvement. Will be started on a prednisone regimen and Mucinex DM and continue to use her inhaler or nebulizers at home every 6 hours.    Vanetta Mulders,  MD 09/29/14 2202

## 2014-09-29 NOTE — Discharge Instructions (Signed)
Continue to use your albuterol inhaler or nebulizer every 6 hours. Take the prednisone as directed for the next 5 days. Take Mucinex for the next 7 days. Return for any new or worse symptoms. Chest x-ray here today was negative for pneumonia.

## 2014-09-29 NOTE — ED Notes (Signed)
Pt reports nasal congestion as well

## 2014-09-29 NOTE — ED Notes (Signed)
Pt is here with cough and shortness of breath and reports fever.  PT has history of copd.

## 2014-10-01 DIAGNOSIS — I129 Hypertensive chronic kidney disease with stage 1 through stage 4 chronic kidney disease, or unspecified chronic kidney disease: Secondary | ICD-10-CM | POA: Diagnosis not present

## 2014-10-01 DIAGNOSIS — N182 Chronic kidney disease, stage 2 (mild): Secondary | ICD-10-CM | POA: Diagnosis not present

## 2014-10-01 DIAGNOSIS — J441 Chronic obstructive pulmonary disease with (acute) exacerbation: Secondary | ICD-10-CM | POA: Diagnosis not present

## 2014-10-01 DIAGNOSIS — J329 Chronic sinusitis, unspecified: Secondary | ICD-10-CM | POA: Diagnosis not present

## 2014-10-10 ENCOUNTER — Other Ambulatory Visit: Payer: Self-pay | Admitting: *Deleted

## 2014-10-10 VITALS — BP 144/62 | HR 67 | Resp 24 | Ht 64.0 in | Wt 84.0 lb

## 2014-10-10 DIAGNOSIS — I10 Essential (primary) hypertension: Secondary | ICD-10-CM

## 2014-10-13 NOTE — Patient Outreach (Signed)
[place] [facility]) Care Management  [facility] Care Manager  10/13/2014   [name] 1929-10-16 [id number]  Subjective: Pt reports on recent ED visit- bronchitis.  Pt states f/u with MD, finished Prednisone, Antibiotic.  Pt states she ran out of nebulizer medication, pharmacy said she needs pre approval  Before getting refill.  Pt states in the past received medication in the mail but does not have package Anymore.  Pt states she has a cough, coming from sinuses, taking allergy medication every day. Pt reports granddaughter comes once a week, checks BP, last reading 170/90, another reading- 150/60.    Objective: lungs clear.  Pt has cough (states from sinuses).  Filed Vitals:   10/10/14 1335  BP: 144/62  Pulse: 67  Resp: 24     Current Medications:  Current Outpatient Prescriptions  Medication Sig Dispense Refill  . acetaminophen (TYLENOL) 500 MG tablet Take 1,000 mg by mouth every 6 (six) hours as needed for pain.    Marland Kitchen albuterol (PROVENTIL HFA;VENTOLIN HFA) 108 (90 BASE) MCG/ACT inhaler Inhale 1-2 puffs into the lungs every 6 (six) hours as needed for wheezing or shortness of breath.    Marland Kitchen amLODipine (NORVASC) 5 MG tablet Take 1 tablet (5 mg total) by mouth daily. 30 tablet 6  . Ascorbic Acid (VITAMIN C PO) Take 1 tablet by mouth daily.    Marland Kitchen aspirin EC 81 MG tablet Take 81 mg by mouth daily.    Marland Kitchen atorvastatin (LIPITOR) 10 MG tablet Take 10 mg by mouth daily at 6 PM.     . benazepril (LOTENSIN) 40 MG tablet Take 1 tablet (40 mg total) by mouth daily. 90 tablet 3  . CALCIUM PO Take 1 tablet by mouth daily.     . carvedilol (COREG) 3.125 MG tablet TAKE 1 TABLET BY MOUTH TWICE DAILY WITH A MEAL 60 tablet 6  . Cholecalciferol (VITAMIN D PO) Take 1 tablet by mouth daily.    Marland Kitchen dextromethorphan-guaiFENesin (MUCINEX DM) 30-600 MG per 12 hr tablet Take 1 tablet by mouth 2 (two) times daily. 14 tablet 1  . digoxin (DIGOX) 0.125 MG tablet Take 1 tablet (0.125 mg total) by mouth  daily. 30 tablet 6  . fluticasone (FLONASE) 50 MCG/ACT nasal spray Place 2 sprays into the nose as needed for rhinitis.    . furosemide (LASIX) 20 MG tablet Take 20 mg by mouth daily.      Marland Kitchen glimepiride (AMARYL) 2 MG tablet Take 2 mg by mouth daily before breakfast.      . ipratropium-albuterol (DUONEB) 0.5-2.5 (3) MG/3ML SOLN Take 3 mLs by nebulization daily as needed (wheezing).    Marland Kitchen loratadine (CLARITIN) 10 MG tablet Take 10 mg by mouth daily as needed for allergies.    . mirtazapine (REMERON) 15 MG tablet Take 15 mg by mouth at bedtime.  3  . Multiple Vitamin (MULTIVITAMIN) tablet Take 1 tablet by mouth 2 (two) times a week.     Marland Kitchen POTASSIUM PO Take 1 tablet by mouth daily.    . predniSONE (DELTASONE) 10 MG tablet Take 4 tablets (40 mg total) by mouth daily. (Patient not taking: Reported on 10/10/2014) 20 tablet 0   No current facility-administered medications for this visit.   [facility] CM Care Plan Problem One        Patient Outreach from 10/10/2014 in [place] CM Short Term Goal #1 (0-30 days)  Pt would adhere more closely to Low Na+ diet in the next 30 days  [facility] CM Short Term Goal #1 Start Date  09/11/14   [facility] CM Short Term Goal #1 Met Date  10/10/14   Interventions for Short Term Goal #1  Provided pt with information foods high in na+ -avoid, foods low in sodium.     [facility] CM Short Term Goal #2 (0-30 days)  Pt or daughter to check BP 5 days a week,record in the next 30 days    [facility]N CM Short Term Goal #2 Start Date  09/11/14   [facility] CM Short Term Goal #2 Met Date  -- [goal not met- granddaughter checks one day a week ]   [facility]N CM Short Term Goal #3 (0-30 days)  Granddaughter to check BP once  a week, record in the next 30 days    [facility]N CM Short Term Goal #3 Start Date  10/10/14   Interventions for Short Tern Goal #3  Discussed with pt importance of monitoring BP, high BP risk for stroke/MI.   hx of stroke.       [facility] CM Care Plan Problem Two        Patient Outreach from  10/10/2014 in [place] CM Short Term Goal #1 Met Date   -- Barrie Folk not met,  did not f/u about UTI ]      Assessment:   Medication issue- pt needs refill on nebulizer medications (Albuterol Sulfate, Ipatropium Bromide).                          BP status- Pt's BP today 144/62.    Nutritional status- pt's reports good appetite, gained one                           Pound since last home visit.  Plan:                RN CM called [facility] for pt to inquire about refill on nebulizer medicine (Duoneb), was told by                             Pharmacist that pt needs to present her Medicare card (meds covered under her Part B) to which                             Pt to present today, pick up meds.                            Pt to have granddaughter check BP once a week, record.                          Pt to continue to monitor salt intake.                          RN CM to continue to provide community nurse case management services, f/u  Again 7/15.    Zara Chess.   [place] Care Management  [phone]

## 2014-10-28 ENCOUNTER — Other Ambulatory Visit: Payer: Self-pay | Admitting: Cardiology

## 2014-10-30 ENCOUNTER — Other Ambulatory Visit: Payer: Self-pay | Admitting: Cardiovascular Disease

## 2014-11-07 ENCOUNTER — Other Ambulatory Visit: Payer: Self-pay | Admitting: *Deleted

## 2014-11-07 VITALS — BP 130/80 | HR 71 | Resp 20 | Ht 64.0 in | Wt 92.0 lb

## 2014-11-07 DIAGNOSIS — I10 Essential (primary) hypertension: Secondary | ICD-10-CM

## 2014-11-07 NOTE — Patient Outreach (Signed)
[place] [facility]) Care Management   11/07/2014  [name] 1929-08-15 [id number]  [name] is an 81 y.o. female  Subjective:  Pt states not feeling well today, getting a cold/sinuses bothering her quite a bit.  Pt states she feels like she has an infection, no fever, has yellow to green drainage, thick, started 2 days ago.  Pt states she called Dr. Wynell Balloon office (ENT MD),was told on vacation until next week.   Pt states she wants to wait and  call Dr. Nanetta Batty but agreed with RN CM to call Primary Care MD if sinuses got worse.   Pt reports  granddaughter has been checking her BP once a week, not recording, running 140/80.  Pt states she received a letter today time to f/u with Dr. Baird Cancer, need to call and make an appointment.   Pt states appetite is good, current weight 92 lbs.   Objective:   Filed Vitals:   11/07/14 1324  BP: 130/80  Pulse: 71  Resp: 20    ROS  Physical Exam  Constitutional: She is oriented to person, place, and time.  Pt underweight- BMI 15.8  Cardiovascular: Normal rate.   Respiratory: Breath sounds normal.  GI: Soft.  Musculoskeletal: Normal range of motion.  Neurological: She is alert and oriented to person, place, and time.  Psychiatric: She has a normal mood and affect. Her behavior is normal. Judgment and thought content normal.    Current Medications:  Medications reviewed with pt.  Current Outpatient Prescriptions  Medication Sig Dispense Refill  . acetaminophen (TYLENOL) 500 MG tablet Take 1,000 mg by mouth every 6 (six) hours as needed for pain.    Marland Kitchen albuterol (PROVENTIL HFA;VENTOLIN HFA) 108 (90 BASE) MCG/ACT inhaler Inhale 1-2 puffs into the lungs every 6 (six) hours as needed for wheezing or shortness of breath.    Marland Kitchen amLODipine (NORVASC) 5 MG tablet Take 1 tablet (5 mg total) by mouth daily. 30 tablet 6  . Ascorbic Acid (VITAMIN C PO) Take 1 tablet by mouth daily.    Marland Kitchen aspirin EC 81 MG tablet Take 81 mg by mouth daily.     Marland Kitchen atorvastatin (LIPITOR) 10 MG tablet Take 10 mg by mouth daily at 6 PM.     . benazepril (LOTENSIN) 40 MG tablet Take 1 tablet (40 mg total) by mouth daily. 90 tablet 3  . CALCIUM PO Take 1 tablet by mouth daily.     . carvedilol (COREG) 3.125 MG tablet TAKE 1 TABLET BY MOUTH TWICE DAILY WITH A MEAL 60 tablet 6  . Cholecalciferol (VITAMIN D PO) Take 1 tablet by mouth daily.    Marland Kitchen DIGOX 125 MCG tablet TAKE 1 TABLET BY MOUTH EVERY DAY 30 tablet 0  . fluticasone (FLONASE) 50 MCG/ACT nasal spray Place 2 sprays into the nose as needed for rhinitis.    . furosemide (LASIX) 20 MG tablet Take 20 mg by mouth daily.      Marland Kitchen glimepiride (AMARYL) 2 MG tablet Take 2 mg by mouth daily before breakfast.      . ipratropium-albuterol (DUONEB) 0.5-2.5 (3) MG/3ML SOLN Take 3 mLs by nebulization daily as needed (wheezing).    Marland Kitchen loratadine (CLARITIN) 10 MG tablet Take 10 mg by mouth daily as needed for allergies.    . mirtazapine (REMERON) 15 MG tablet Take 15 mg by mouth at bedtime.  3  . Multiple Vitamin (MULTIVITAMIN) tablet Take 1 tablet by mouth 2 (two) times a week.     Marland Kitchen  POTASSIUM PO Take 1 tablet by mouth daily.    Marland Kitchen dextromethorphan-guaiFENesin (MUCINEX DM) 30-600 MG per 12 hr tablet Take 1 tablet by mouth 2 (two) times daily. (Patient not taking: Reported on 11/07/2014) 14 tablet 1  . predniSONE (DELTASONE) 10 MG tablet Take 4 tablets (40 mg total) by mouth daily. (Patient not taking: Reported on 10/10/2014) 20 tablet 0   No current facility-administered medications for this visit.    Functional Status:   In your present state of health, do you have any difficulty performing the following activities: 09/11/2014  Hearing? N  Vision? N  Difficulty concentrating or making decisions? N  Walking or climbing stairs? N  Dressing or bathing? N  Doing errands, shopping? Y  Preparing Food and eating ? N  Using the Toilet? N  In the past six months, have you accidently leaked urine? Y  Do you have problems  with loss of bowel control? N  Managing your Medications? Y  Managing your Finances? N  Housekeeping or managing your Housekeeping? N    Fall/Depression Screening:    PHQ 2/9 Scores 09/11/2014  PHQ - 2 Score 1    Assessment:  HTN- BP today 130/80.  No edema, sob.  With pt reporting likes hot dogs, discussed limiting                         Amount- high in sodium which she said does not eat often.                          Nutritional status: pt gaining weight as reports weight today 92 lbs, up from 84 lbs last month.  Plan:   Pt to continue to have granddaughter check BP once a week, if elevated call MD.              Pt to call Dr. Wynell Balloon office next week, report issues with sinuses, schedule appointment             Pt to call Dr. Baird Cancer office, schedule appointment (received letter time for office visit)              Plan to discharge pt from RN CM services, goals met             Plan to fax a correspondence letter to Dr. Baird Cancer to inform MD of discharging pt from [place] as well as send 7/15 encounter.             Plan to inform [facility] CMA of pt's discharge of RN CM services, close case.              [facility] CM Care Plan Problem One        Patient Outreach from 11/07/2014 in [place] Problem One  Elevated BP    Care Plan for Problem One  Active   THN Long Term Goal (31-90 days)  Pt's BP would go below 140/90 in the next 90 days    [facility]N Long Term Goal Start Date  09/12/14   [facility]N Long Term Goal Met Date  11/07/14   [facility] CM Short Term Goal #3 (0-30 days)  Granddaughter to check BP once  a week, record in the next 30 days    [facility]N CM Short Term Goal #3  Start Date  10/10/14   [facility] CM Short Term Goal #3 Met Date  11/07/14 [ granddaughter checks her BP once a week, not recording. ]   Interventions for Short Tern Goal #3  Discussed with pt importance of monitoring BP, high BP risk for stroke/MI.   hx of stroke.       [facility] CM Care Plan Problem  Two        Patient Outreach from 10/10/2014 in [place] CM Short Term Goal #1 Met Date   -- Barrie Folk not met,  did not f/u about UTI ]     Zara Chess.   [place] Care Management  [phone]

## 2014-11-10 ENCOUNTER — Encounter: Payer: Self-pay | Admitting: *Deleted

## 2014-11-14 NOTE — Patient Outreach (Signed)
[place] [facility]) Care Management  11/14/2014  [name] [birth year] [id number]   Notification from Kathie Rhodes, RN to close case due to goals met with [place] Management.  Ronnell Freshwater. [place] [place] Management [place] Assistant Phone: [phone] Fax: [phone]

## 2014-11-28 ENCOUNTER — Other Ambulatory Visit: Payer: Self-pay | Admitting: Cardiovascular Disease

## 2014-12-31 ENCOUNTER — Other Ambulatory Visit: Payer: Self-pay | Admitting: Cardiovascular Disease

## 2015-01-02 ENCOUNTER — Emergency Department (HOSPITAL_COMMUNITY): Payer: Medicare Other

## 2015-01-02 ENCOUNTER — Emergency Department (HOSPITAL_COMMUNITY)
Admission: EM | Admit: 2015-01-02 | Discharge: 2015-01-02 | Disposition: A | Discharge status: Home / Self Care | Payer: Medicare Other | Attending: Emergency Medicine | Admitting: Emergency Medicine

## 2015-01-02 ENCOUNTER — Other Ambulatory Visit: Payer: Self-pay

## 2015-01-02 ENCOUNTER — Encounter (HOSPITAL_COMMUNITY): Payer: Self-pay | Admitting: *Deleted

## 2015-01-02 DIAGNOSIS — E119 Type 2 diabetes mellitus without complications: Secondary | ICD-10-CM | POA: Insufficient documentation

## 2015-01-02 DIAGNOSIS — F329 Major depressive disorder, single episode, unspecified: Secondary | ICD-10-CM | POA: Insufficient documentation

## 2015-01-02 DIAGNOSIS — R0602 Shortness of breath: Secondary | ICD-10-CM | POA: Diagnosis present

## 2015-01-02 DIAGNOSIS — Z720 Tobacco use: Secondary | ICD-10-CM | POA: Diagnosis not present

## 2015-01-02 DIAGNOSIS — I509 Heart failure, unspecified: Secondary | ICD-10-CM | POA: Insufficient documentation

## 2015-01-02 DIAGNOSIS — N390 Urinary tract infection, site not specified: Secondary | ICD-10-CM | POA: Insufficient documentation

## 2015-01-02 DIAGNOSIS — Z79899 Other long term (current) drug therapy: Secondary | ICD-10-CM | POA: Insufficient documentation

## 2015-01-02 DIAGNOSIS — M6281 Muscle weakness (generalized): Secondary | ICD-10-CM | POA: Diagnosis not present

## 2015-01-02 DIAGNOSIS — J441 Chronic obstructive pulmonary disease with (acute) exacerbation: Secondary | ICD-10-CM | POA: Diagnosis not present

## 2015-01-02 DIAGNOSIS — I10 Essential (primary) hypertension: Secondary | ICD-10-CM | POA: Diagnosis not present

## 2015-01-02 DIAGNOSIS — E780 Pure hypercholesterolemia: Secondary | ICD-10-CM | POA: Diagnosis not present

## 2015-01-02 DIAGNOSIS — I2510 Atherosclerotic heart disease of native coronary artery without angina pectoris: Secondary | ICD-10-CM | POA: Insufficient documentation

## 2015-01-02 DIAGNOSIS — Z7982 Long term (current) use of aspirin: Secondary | ICD-10-CM | POA: Diagnosis not present

## 2015-01-02 DIAGNOSIS — J449 Chronic obstructive pulmonary disease, unspecified: Secondary | ICD-10-CM | POA: Diagnosis not present

## 2015-01-02 DIAGNOSIS — R197 Diarrhea, unspecified: Secondary | ICD-10-CM | POA: Diagnosis not present

## 2015-01-02 HISTORY — DX: Chronic obstructive pulmonary disease, unspecified: J44.9

## 2015-01-02 LAB — CBC
HCT: 40.2 % (ref 36.0–46.0)
Hemoglobin: 13 g/dL (ref 12.0–15.0)
MCH: 31.3 pg (ref 26.0–34.0)
MCHC: 32.3 g/dL (ref 30.0–36.0)
MCV: 96.9 fL (ref 78.0–100.0)
PLATELETS: 152 10*3/uL (ref 150–400)
RBC: 4.15 MIL/uL (ref 3.87–5.11)
RDW: 13.5 % (ref 11.5–15.5)
WBC: 14 10*3/uL — ABNORMAL HIGH (ref 4.0–10.5)

## 2015-01-02 LAB — URINE MICROSCOPIC-ADD ON

## 2015-01-02 LAB — I-STAT TROPONIN, ED: TROPONIN I, POC: 0 ng/mL (ref 0.00–0.08)

## 2015-01-02 LAB — URINALYSIS, ROUTINE W REFLEX MICROSCOPIC
Bilirubin Urine: NEGATIVE
GLUCOSE, UA: NEGATIVE mg/dL
Ketones, ur: 15 mg/dL — AB
Nitrite: POSITIVE — AB
Protein, ur: 30 mg/dL — AB
Specific Gravity, Urine: 1.019 (ref 1.005–1.030)
Urobilinogen, UA: 1 mg/dL (ref 0.0–1.0)
pH: 5.5 (ref 5.0–8.0)

## 2015-01-02 LAB — BASIC METABOLIC PANEL
Anion gap: 12 (ref 5–15)
BUN: 20 mg/dL (ref 6–20)
CALCIUM: 9 mg/dL (ref 8.9–10.3)
CO2: 27 mmol/L (ref 22–32)
CREATININE: 0.88 mg/dL (ref 0.44–1.00)
Chloride: 93 mmol/L — ABNORMAL LOW (ref 101–111)
GFR, EST NON AFRICAN AMERICAN: 59 mL/min — AB (ref >60)
Glucose, Bld: 121 mg/dL — ABNORMAL HIGH (ref 65–99)
Potassium: 4.1 mmol/L (ref 3.5–5.1)
SODIUM: 132 mmol/L — AB (ref 135–145)

## 2015-01-02 LAB — POC OCCULT BLOOD, ED: Fecal Occult Bld: NEGATIVE

## 2015-01-02 MED ORDER — LEVOFLOXACIN 500 MG PO TABS
500.0000 mg | ORAL_TABLET | Freq: Every day | ORAL | Status: AC
Start: 2015-01-02 — End: 2015-01-09

## 2015-01-02 MED ORDER — PREDNISONE 10 MG PO TABS: 40.0000 mg | ORAL_TABLET | Freq: Every day | ORAL | Status: AC

## 2015-01-02 MED ORDER — IPRATROPIUM-ALBUTEROL 0.5-2.5 (3) MG/3ML IN SOLN
3.0000 mL | Freq: Once | RESPIRATORY_TRACT | Status: AC
  Administered 2015-01-02: 3 mL via RESPIRATORY_TRACT
  Filled 2015-01-02: qty 3

## 2015-01-02 MED ORDER — PREDNISONE 20 MG PO TABS
60.0000 mg | ORAL_TABLET | Freq: Once | ORAL | Status: AC
  Administered 2015-01-02: 60 mg via ORAL
  Filled 2015-01-02: qty 3

## 2015-01-02 NOTE — ED Notes (Signed)
MD at bedside. 

## 2015-01-02 NOTE — ED Notes (Signed)
Pt verbalized understanding of d/c instructions and has no further questions. Pt stable and NAD.  

## 2015-01-02 NOTE — ED Notes (Addendum)
Pt reports that she started feeling bad last night and feeling sob. Denies recent cough or chest pain. Reports having black stools this am. No swelling noted. spo2 96% and ekg done.

## 2015-01-02 NOTE — ED Provider Notes (Signed)
CSN: [id number]     Arrival date & time 01/02/15  1625 History   First MD Initiated Contact with Patient 01/02/15 2017     Chief Complaint  Patient presents with  . Shortness of Breath     (Consider location/radiation/quality/duration/timing/severity/associated sxs/prior Treatment) Patient is a 81 y.o. female presenting with shortness of breath.  Shortness of Breath Severity:  Moderate Onset quality:  Gradual Duration:  1 day Timing:  Constant Progression:  Waxing and waning Chronicity:  New Relieved by: nebulizer helps. Ineffective treatments:  None tried Associated symptoms: cough (cough productive of yellow)   Associated symptoms: no abdominal pain, no chest pain, no fever, no headaches, no neck pain, no rash, no sore throat, no vomiting and no wheezing   Risk factors: tobacco use   Risk factors: no hx of PE/DVT, no prolonged immobilization and no recent surgery     Past Medical History  Diagnosis Date  . Cardiomyopathy     non-ischemic 04/2008 EF 45% cardiac MRI no scar  . Hypertension   . Hypercholesterolemia   . CAD (coronary artery disease)   . CHF (congestive heart failure)   . Dyspnea on exertion     chronic  . Type II diabetes mellitus   . Sinus headache     "chronic"  . Stroke 1997    "mild"  . Chronic sinusitis   . Depression   . COPD (chronic obstructive pulmonary disease)    Past Surgical History  Procedure Laterality Date  . Carotid angiogram      2006  . Esophagogastroduodenoscopy      with foreign body removal  . Nasal sinus surgery  ~1974;~ 1976  . Tonsillectomy      "when I was a chld"   Family History  Problem Relation Age of Onset  . Coronary artery disease      family history  . Heart disease Father   . Alcohol abuse Sister   . Alcohol abuse Brother    Social History  Substance Use Topics  . Smoking status: Current Every Day Smoker -- 0.50 packs/day for 64 years    Types: Cigarettes  . Smokeless tobacco: Never Used  . Alcohol  Use: No   OB History    No data available     Review of Systems  Constitutional: Positive for appetite change and fatigue. Negative for fever.  HENT: Negative for sore throat.   Eyes: Negative for visual disturbance.  Respiratory: Positive for cough (cough productive of yellow) and shortness of breath. Negative for wheezing.   Cardiovascular: Negative for chest pain and leg swelling.  Gastrointestinal: Positive for diarrhea (off and on weeks, episode of diarrhea this AM). Negative for nausea, vomiting and abdominal pain. Blood in stool: black stool off and on for months.  Genitourinary: Negative for dysuria, hematuria and difficulty urinating.  Musculoskeletal: Negative for back pain and neck pain.  Skin: Negative for rash.  Neurological: Negative for syncope and headaches.      Allergies  Review of patient's allergies indicates no known allergies.  Home Medications   Prior to Admission medications   Medication Sig Start Date End Date Taking? Authorizing Provider  acetaminophen (TYLENOL) 500 MG tablet Take 1,000 mg by mouth every 6 (six) hours as needed for pain.   Yes Historical Provider, MD  albuterol (PROVENTIL HFA;VENTOLIN HFA) 108 (90 BASE) MCG/ACT inhaler Inhale 1-2 puffs into the lungs every 6 (six) hours as needed for wheezing or shortness of breath.   Yes Historical Provider, MD  amLODipine (NORVASC) 5 MG tablet Take 1 tablet (5 mg total) by mouth daily. 12/25/13  Yes Brittainy Sherlynn Carbon, PA-C  Ascorbic Acid (VITAMIN C PO) Take 1 tablet by mouth daily.   Yes Historical Provider, MD  aspirin EC 81 MG tablet Take 81 mg by mouth daily.   Yes Historical Provider, MD  atorvastatin (LIPITOR) 10 MG tablet Take 10 mg by mouth daily at 6 PM.  09/01/14  Yes Historical Provider, MD  benazepril (LOTENSIN) 40 MG tablet Take 1 tablet (40 mg total) by mouth daily. 05/29/12  Yes Wendall Stade, MD  CALCIUM PO Take 1 tablet by mouth daily.    Yes Historical Provider, MD  carvedilol (COREG)  3.125 MG tablet TAKE 1 TABLET BY MOUTH TWICE DAILY WITH A MEAL 09/03/14  Yes Wendall Stade, MD  Cholecalciferol (VITAMIN D PO) Take 1 tablet by mouth daily.   Yes Historical Provider, MD  DIGOX 125 MCG tablet TAKE 1 TABLET BY MOUTH EVERY DAY 12/31/14  Yes Wendall Stade, MD  fluticasone [facility]) 50 MCG/ACT nasal spray Place 2 sprays into the nose as needed for rhinitis.   Yes Historical Provider, MD  furosemide (LASIX) 20 MG tablet Take 20 mg by mouth daily.     Yes Historical Provider, MD  glimepiride (AMARYL) 2 MG tablet Take 2 mg by mouth daily before breakfast.     Yes Historical Provider, MD  loratadine (CLARITIN) 10 MG tablet Take 10 mg by mouth daily as needed for allergies.   Yes Historical Provider, MD  Multiple Vitamin (MULTIVITAMIN) tablet Take 1 tablet by mouth daily.    Yes Historical Provider, MD  POTASSIUM PO Take 1 tablet by mouth daily.   Yes Historical Provider, MD  dextromethorphan-guaiFENesin (MUCINEX DM) 30-600 MG per 12 hr tablet Take 1 tablet by mouth 2 (two) times daily. Patient not taking: Reported on 11/07/2014 09/29/14   Vanetta Mulders, MD  levofloxacin (LEVAQUIN) 500 MG tablet Take 1 tablet (500 mg total) by mouth daily. 01/02/15 01/09/15  Alvira Monday, MD  predniSONE (DELTASONE) 10 MG tablet Take 4 tablets (40 mg total) by mouth daily. 01/02/15 01/06/15  Alvira Monday, MD   BP 150/55 mmHg  Pulse 79  Temp(Src) 98.6 F (37 C) (Oral)  Resp 21  Ht 5\' 4"  (1.626 m)  Wt 87 lb (39.463 kg)  BMI 14.93 kg/m2  SpO2 93% Physical Exam  Constitutional: She is oriented to person, place, and time. She appears well-developed and well-nourished. No distress.  HENT:  Head: Normocephalic and atraumatic.  Eyes: Conjunctivae and EOM are normal.  Neck: Normal range of motion.  Cardiovascular: Normal rate, regular rhythm, normal heart sounds and intact distal pulses.  Exam reveals no gallop and no friction rub.   No murmur heard. Pulmonary/Chest: Effort normal and breath sounds normal.  No respiratory distress. She has no wheezes. She has no rales.  Abdominal: Soft. She exhibits no distension. There is no tenderness. There is no guarding.  Genitourinary: Rectal exam shows anal tone normal. Guaiac negative stool.  Musculoskeletal: She exhibits no edema or tenderness.  Neurological: She is alert and oriented to person, place, and time.  Skin: Skin is warm and dry. No rash noted. She is not diaphoretic. No erythema.  Nursing note and vitals reviewed.   ED Course  Procedures (including critical care time) Labs Review Labs Reviewed  BASIC METABOLIC PANEL - Abnormal; Notable for the following:    Sodium 132 (*)    Chloride 93 (*)    Glucose,  Bld 121 (*)    GFR calc non Af Amer 59 (*)    All other components within normal limits  CBC - Abnormal; Notable for the following:    WBC 14.0 (*)    All other components within normal limits  URINALYSIS, ROUTINE W REFLEX MICROSCOPIC (NOT AT [facility]) - Abnormal; Notable for the following:    APPearance CLOUDY (*)    Hgb urine dipstick MODERATE (*)    Ketones, ur 15 (*)    Protein, ur 30 (*)    Nitrite POSITIVE (*)    Leukocytes, UA SMALL (*)    All other components within normal limits  URINE MICROSCOPIC-ADD ON - Abnormal; Notable for the following:    Bacteria, UA MANY (*)    Casts HYALINE CASTS (*)    All other components within normal limits  URINE CULTURE  I-STAT TROPOININ, ED  POC OCCULT BLOOD, ED    Imaging Review Dg Chest 2 View  01/02/2015   CLINICAL DATA:  Shortness of breath.  EXAM: CHEST  2 VIEW  COMPARISON:  September 29, 2014.  FINDINGS: The heart size and mediastinal contours are within normal limits. Both lungs are clear. No pneumothorax or pleural effusion is noted. Hyperexpansion of the lungs is noted suggesting chronic obstructive pulmonary disease. The visualized skeletal structures are unremarkable.  IMPRESSION: Findings consistent with chronic obstructive pulmonary disease. No acute cardiopulmonary abnormality  seen.   Electronically Signed   By: Lupita Raider, M.D.   On: 01/02/2015 17:36   I have personally reviewed and evaluated these images and lab results as part of my medical decision-making.   EKG Interpretation   Date/Time:  Friday January 02 2015 16:30:54 EDT Ventricular Rate:  87 PR Interval:    QRS Duration: 138 QT Interval:  386 QTC Calculation: 464 R Axis:   -7 Text Interpretation:  Normal sinus rhythm Right bundle branch block  Abnormal ECG Ventricular PVC Similar to EKG 09/04/2014 Confirmed by  [facility] MD, Holley Kocurek ([id number]) on 01/02/2015 11:06:16 PM      MDM   Final diagnoses:  COPD exacerbation  UTI (lower urinary tract infection)   81 year old female with a history of diabetes, hypercholesterolemia, paroxysmal atrial fibrillation, hypertension, COPD presents with concern of generalized weakness and shortness of breath.  Chest x-ray shows no sign of pneumonia, no pulmonary edema, and shows findings most consistent with COPD. Patient without chest pain, EKG similar to prior, an i-STAT troponin negative many hours after symptoms developed, and have low suspicion symptoms represent ACS.  CBC shows mild leukocytosis.  Patient reports change in sputum, with new productive cough, increased dyspnea which is improved by nebulizers at home. Patient with diminished breath sounds on exam and given nebulizer with improvement. She is given 60 mg of prednisone.  Urinalysis also shows signs of urinary tract infection. Patient is given treatment for COPD exacerbation including prednisone for 4 days, and will also be covered with Levaquin for COPD exacerbation and urinary tract infection.  Patient is hemodynamically stable, normal saturation on room air, and is appropriate for close outpatient follow-up. Patient discharged in stable condition with understanding of reasons to return.    Alvira Monday, MD 01/03/15 0300

## 2015-01-04 LAB — URINE CULTURE

## 2015-01-05 ENCOUNTER — Telehealth (HOSPITAL_COMMUNITY): Payer: Self-pay

## 2015-01-05 NOTE — Telephone Encounter (Signed)
Post ED Visit - Positive Culture Follow-up  Culture report reviewed by antimicrobial stewardship pharmacist:  []  Celedonio Miyamoto, Pharm.D., BCPS []  Georgina Pillion, Pharm.D., BCPS []  [place], [address].D., BCPS, AAHIVP []  Estella Husk, Pharm.D., BCPS, AAHIVP []  [place], [address].D. [x]  Tennis Must, Pharm.D.  Positive urine culture Treated with levofloxacin, organism sensitive to the same and no further patient follow-up is required at this time.  Ashley Jacobs 01/05/2015, 9:22 AM

## 2015-01-13 ENCOUNTER — Emergency Department (HOSPITAL_COMMUNITY): Payer: Medicare Other

## 2015-01-13 ENCOUNTER — Emergency Department (HOSPITAL_COMMUNITY)
Admission: EM | Admit: 2015-01-13 | Discharge: 2015-01-13 | Disposition: A | Discharge status: Home / Self Care | Payer: Medicare Other | Attending: Emergency Medicine | Admitting: Emergency Medicine

## 2015-01-13 ENCOUNTER — Encounter (HOSPITAL_COMMUNITY): Payer: Self-pay | Admitting: Emergency Medicine

## 2015-01-13 DIAGNOSIS — Z8659 Personal history of other mental and behavioral disorders: Secondary | ICD-10-CM | POA: Diagnosis not present

## 2015-01-13 DIAGNOSIS — I509 Heart failure, unspecified: Secondary | ICD-10-CM | POA: Diagnosis not present

## 2015-01-13 DIAGNOSIS — R197 Diarrhea, unspecified: Secondary | ICD-10-CM | POA: Diagnosis not present

## 2015-01-13 DIAGNOSIS — R0602 Shortness of breath: Secondary | ICD-10-CM | POA: Diagnosis present

## 2015-01-13 DIAGNOSIS — I10 Essential (primary) hypertension: Secondary | ICD-10-CM | POA: Diagnosis not present

## 2015-01-13 DIAGNOSIS — I2510 Atherosclerotic heart disease of native coronary artery without angina pectoris: Secondary | ICD-10-CM | POA: Diagnosis not present

## 2015-01-13 DIAGNOSIS — Z79899 Other long term (current) drug therapy: Secondary | ICD-10-CM | POA: Diagnosis not present

## 2015-01-13 DIAGNOSIS — Z720 Tobacco use: Secondary | ICD-10-CM | POA: Diagnosis not present

## 2015-01-13 DIAGNOSIS — J441 Chronic obstructive pulmonary disease with (acute) exacerbation: Secondary | ICD-10-CM | POA: Diagnosis not present

## 2015-01-13 DIAGNOSIS — E1165 Type 2 diabetes mellitus with hyperglycemia: Secondary | ICD-10-CM | POA: Insufficient documentation

## 2015-01-13 DIAGNOSIS — R739 Hyperglycemia, unspecified: Secondary | ICD-10-CM

## 2015-01-13 DIAGNOSIS — Z8673 Personal history of transient ischemic attack (TIA), and cerebral infarction without residual deficits: Secondary | ICD-10-CM | POA: Diagnosis not present

## 2015-01-13 DIAGNOSIS — J449 Chronic obstructive pulmonary disease, unspecified: Secondary | ICD-10-CM | POA: Diagnosis not present

## 2015-01-13 DIAGNOSIS — E780 Pure hypercholesterolemia: Secondary | ICD-10-CM | POA: Diagnosis not present

## 2015-01-13 DIAGNOSIS — Z7982 Long term (current) use of aspirin: Secondary | ICD-10-CM | POA: Insufficient documentation

## 2015-01-13 LAB — BASIC METABOLIC PANEL
ANION GAP: 12 (ref 5–15)
BUN: 13 mg/dL (ref 6–20)
CHLORIDE: 90 mmol/L — AB (ref 101–111)
CO2: 28 mmol/L (ref 22–32)
Calcium: 9.5 mg/dL (ref 8.9–10.3)
Creatinine, Ser: 0.98 mg/dL (ref 0.44–1.00)
GFR calc non Af Amer: 52 mL/min — ABNORMAL LOW (ref >60)
Glucose, Bld: 478 mg/dL — ABNORMAL HIGH (ref 65–99)
POTASSIUM: 4.8 mmol/L (ref 3.5–5.1)
SODIUM: 130 mmol/L — AB (ref 135–145)

## 2015-01-13 LAB — BRAIN NATRIURETIC PEPTIDE: B NATRIURETIC PEPTIDE 5: 43.5 pg/mL (ref 0.0–100.0)

## 2015-01-13 LAB — URINALYSIS, ROUTINE W REFLEX MICROSCOPIC
BILIRUBIN URINE: NEGATIVE
Glucose, UA: >1000 mg/dL — AB
HGB URINE DIPSTICK: NEGATIVE
KETONES UR: NEGATIVE mg/dL
Leukocytes, UA: NEGATIVE
NITRITE: NEGATIVE
PH: 7.5 (ref 5.0–8.0)
Protein, ur: NEGATIVE mg/dL
Specific Gravity, Urine: 1.015 (ref 1.005–1.030)
UROBILINOGEN UA: 0.2 mg/dL (ref 0.0–1.0)

## 2015-01-13 LAB — CBC
HEMATOCRIT: 45.9 % (ref 36.0–46.0)
Hemoglobin: 14.9 g/dL (ref 12.0–15.0)
MCH: 31.8 pg (ref 26.0–34.0)
MCHC: 32.5 g/dL (ref 30.0–36.0)
MCV: 97.9 fL (ref 78.0–100.0)
Platelets: 289 10*3/uL (ref 150–400)
RBC: 4.69 MIL/uL (ref 3.87–5.11)
RDW: 13.5 % (ref 11.5–15.5)
WBC: 10.6 10*3/uL — AB (ref 4.0–10.5)

## 2015-01-13 LAB — URINE MICROSCOPIC-ADD ON

## 2015-01-13 LAB — I-STAT TROPONIN, ED: TROPONIN I, POC: 0.01 ng/mL (ref 0.00–0.08)

## 2015-01-13 LAB — TROPONIN I: Troponin I: <0.03 ng/mL (ref <0.031)

## 2015-01-13 LAB — DIGOXIN LEVEL: DIGOXIN LVL: 1 ng/mL (ref 0.8–2.0)

## 2015-01-13 MED ORDER — SODIUM CHLORIDE 0.9 % IV BOLUS (SEPSIS): 500.0000 mL | Freq: Once | INTRAVENOUS | Status: DC

## 2015-01-13 MED ORDER — SODIUM CHLORIDE 0.9 % IV BOLUS (SEPSIS)
1000.0000 mL | Freq: Once | INTRAVENOUS | Status: AC
  Administered 2015-01-13: 1000 mL via INTRAVENOUS

## 2015-01-13 NOTE — ED Notes (Signed)
Pt c/o SOB; pt denies pain; pt sts hx of same and is poor historian; no swelling noted

## 2015-01-13 NOTE — ED Provider Notes (Signed)
CSN: [id number]     Arrival date & time 01/13/15  1418 History   First MD Initiated Contact with Patient 01/13/15 1453     Chief Complaint  Patient presents with  . Shortness of Breath     (Consider location/radiation/quality/duration/timing/severity/associated sxs/prior Treatment) HPI  81 year old female presents with a chief complaint of shortness of breath. When asked to further explain the patient states it's the "same old same old". Patient has been using her albuterol nebulizer every 4 hours for the past couple days. The patient denies a cough. There is no chest pain. She has not noticed any leg swelling. At this moment while talking to her she denies any shortness of breath at all. The daughter in the room has other concerns, including that the patient is not eating and drinking well over the last couple days and has been feeling diffusely weak. She's had on and off diarrhea for a couple weeks. The patient has also been having memory issues. This been going on for at least one month but seems to be more pronounced over last couple days. She seems to forget to take her pills or forget where her medicines are. The patient is not altered at this time. The daughter suggested the patient go to her PCP but at this point she is thinking about getting a new PCP and so She came to the ER instead. She has felt lightheaded over the past couple days as well. She was treated for a UTI 10 days ago but states that she feels some burning with urination, though less than before antibiotics.   Past Medical History  Diagnosis Date  . Cardiomyopathy     non-ischemic 04/2008 EF 45% cardiac MRI no scar  . Hypertension   . Hypercholesterolemia   . CAD (coronary artery disease)   . CHF (congestive heart failure)   . Dyspnea on exertion     chronic  . Type II diabetes mellitus   . Sinus headache     "chronic"  . Stroke 1997    "mild"  . Chronic sinusitis   . Depression   . COPD (chronic obstructive  pulmonary disease)    Past Surgical History  Procedure Laterality Date  . Carotid angiogram      2006  . Esophagogastroduodenoscopy      with foreign body removal  . Nasal sinus surgery  ~1974;~ 1976  . Tonsillectomy      "when I was a chld"   Family History  Problem Relation Age of Onset  . Coronary artery disease      family history  . Heart disease Father   . Alcohol abuse Sister   . Alcohol abuse Brother    Social History  Substance Use Topics  . Smoking status: Current Every Day Smoker -- 0.50 packs/day for 64 years    Types: Cigarettes  . Smokeless tobacco: Never Used  . Alcohol Use: No   OB History    No data available     Review of Systems  Constitutional: Positive for fatigue.  Respiratory: Positive for shortness of breath. Negative for cough.   Cardiovascular: Negative for chest pain and leg swelling.  Gastrointestinal: Positive for diarrhea. Negative for vomiting and abdominal pain.  Genitourinary: Positive for dysuria.  Neurological: Positive for weakness.  All other systems reviewed and are negative.     Allergies  Review of patient's allergies indicates no known allergies.  Home Medications   Prior to Admission medications   Medication Sig Start Date End  Date Taking? Authorizing Amiyrah Lamere  acetaminophen (TYLENOL) 500 MG tablet Take 1,000 mg by mouth every 6 (six) hours as needed for pain.    Historical Keilin Gamboa, MD  albuterol (PROVENTIL HFA;VENTOLIN HFA) 108 (90 BASE) MCG/ACT inhaler Inhale 1-2 puffs into the lungs every 6 (six) hours as needed for wheezing or shortness of breath.    Historical Kobe Jansma, MD  amLODipine (NORVASC) 5 MG tablet Take 1 tablet (5 mg total) by mouth daily. 12/25/13   Brittainy Sherlynn Carbon, PA-C  Ascorbic Acid (VITAMIN C PO) Take 1 tablet by mouth daily.    Historical Colston Pyle, MD  aspirin EC 81 MG tablet Take 81 mg by mouth daily.    Historical Aireana Ryland, MD  atorvastatin (LIPITOR) 10 MG tablet Take 10 mg by mouth daily at 6  PM.  09/01/14   Historical Leiya Keesey, MD  benazepril (LOTENSIN) 40 MG tablet Take 1 tablet (40 mg total) by mouth daily. 05/29/12   Wendall Stade, MD  CALCIUM PO Take 1 tablet by mouth daily.     Historical Liona Wengert, MD  carvedilol (COREG) 3.125 MG tablet TAKE 1 TABLET BY MOUTH TWICE DAILY WITH A MEAL 09/03/14   Wendall Stade, MD  Cholecalciferol (VITAMIN D PO) Take 1 tablet by mouth daily.    Historical Sheriff Rodenberg, MD  dextromethorphan-guaiFENesin (MUCINEX DM) 30-600 MG per 12 hr tablet Take 1 tablet by mouth 2 (two) times daily. Patient not taking: Reported on 11/07/2014 09/29/14   Vanetta Mulders, MD  DIGOX 125 MCG tablet TAKE 1 TABLET BY MOUTH EVERY DAY 12/31/14   Wendall Stade, MD  fluticasone [facility]) 50 MCG/ACT nasal spray Place 2 sprays into the nose as needed for rhinitis.    Historical Zimere Dunlevy, MD  furosemide (LASIX) 20 MG tablet Take 20 mg by mouth daily.      Historical Denys Labree, MD  glimepiride (AMARYL) 2 MG tablet Take 2 mg by mouth daily before breakfast.      Historical Hadassah Rana, MD  loratadine (CLARITIN) 10 MG tablet Take 10 mg by mouth daily as needed for allergies.    Historical Graesyn Schreifels, MD  Multiple Vitamin (MULTIVITAMIN) tablet Take 1 tablet by mouth daily.     Historical Kobee Medlen, MD  POTASSIUM PO Take 1 tablet by mouth daily.    Historical Daimon Kean, MD   BP 138/41 mmHg  Pulse 72  Temp(Src) 98.2 F (36.8 C) (Oral)  Resp 18  SpO2 98% Physical Exam  Constitutional: She is oriented to person, place, and time. She appears cachectic. No distress.  Speaks in complete sentences  HENT:  Head: Normocephalic and atraumatic.  Right Ear: External ear normal.  Left Ear: External ear normal.  Nose: Nose normal.  Mouth/Throat: Mucous membranes are dry.  Eyes: EOM are normal. Pupils are equal, round, and reactive to light. Right eye exhibits no discharge. Left eye exhibits no discharge.  Cardiovascular: Normal rate, regular rhythm and normal heart sounds.   Pulmonary/Chest: Effort  normal and breath sounds normal. She has no wheezes. She has no rales.  Abdominal: Soft. There is no tenderness.  Musculoskeletal: She exhibits no edema.  Neurological: She is alert and oriented to person, place, and time.  CN 2-12 grossly intact. 5/5 strength in all 4 extremities. Grossly normal sensation.  Skin: Skin is warm and dry.  Nursing note and vitals reviewed.   ED Course  Procedures (including critical care time) Labs Review Labs Reviewed  BASIC METABOLIC PANEL - Abnormal; Notable for the following:    Sodium 130 (*)  Chloride 90 (*)    Glucose, Bld 478 (*)    GFR calc non Af Amer 52 (*)    All other components within normal limits  CBC - Abnormal; Notable for the following:    WBC 10.6 (*)    All other components within normal limits  URINALYSIS, ROUTINE W REFLEX MICROSCOPIC (NOT AT [facility]) - Abnormal; Notable for the following:    Glucose, UA >1000 (*)    All other components within normal limits  URINE MICROSCOPIC-ADD ON - Abnormal; Notable for the following:    Squamous Epithelial / LPF FEW (*)    Bacteria, UA FEW (*)    All other components within normal limits  URINE CULTURE  TROPONIN I  BRAIN NATRIURETIC PEPTIDE  DIGOXIN LEVEL  I-STAT TROPOININ, ED    Imaging Review Dg Chest 2 View  01/13/2015   CLINICAL DATA:  Acute shortness of breath.  EXAM: CHEST  2 VIEW  COMPARISON:  January 02, 2015.  FINDINGS: The heart size and mediastinal contours are within normal limits. Both lungs are clear. Hyperexpansion of the lungs is noted consistent with chronic obstructive pulmonary disease. No pneumothorax or pleural effusion is noted. Multilevel degenerative disc disease is noted in the mid thoracic spine.  IMPRESSION: Findings consistent with chronic obstructive pulmonary disease. No acute cardiopulmonary abnormality seen.   Electronically Signed   By: Lupita Raider, M.D.   On: 01/13/2015 15:30   I have personally reviewed and evaluated these images and lab results as  part of my medical decision-making.   EKG Interpretation   Date/Time:  Tuesday January 13 2015 14:43:33 EDT Ventricular Rate:  87 PR Interval:  160 QRS Duration: 122 QT Interval:  374 QTC Calculation: 450 R Axis:   79 Text Interpretation:  Sinus rhythm with frequent and consecutive Premature  ventricular complexes Right bundle branch block Abnormal ECG no  significant change since Sept 9 2016 Confirmed by Criss Alvine  MD, SCOTT  ([id number]) on 01/13/2015 2:55:34 PM      MDM   Final diagnoses:  Hyperglycemia    I believe the patient's dizziness and weakness is from hyperglycemia. Based on her age, renal function, weight, etc. metformin would  be contraindicated in this situation. She feels better with IV fluids. Will recommend she call her PCP tomorrow morning for close follow-up and further outpatient treatment of her hyperglycemia. There is no acidosis or ketonuria. Patient has no shorts breath now and no wheezing. I have very low suspicion for ACS or pulmonary embolism. No lung pathology on chest x-ray. Her memory loss is nonspecific, no focal neuro symptoms or signs to suggest needing further workup now. Given she feels better, will discharge home.    Pricilla Loveless, MD 01/13/15 [phone]

## 2015-01-13 NOTE — Discharge Instructions (Signed)
Hyperglycemia °Hyperglycemia occurs when the glucose (sugar) in your blood is too high. Hyperglycemia can happen for many reasons, but it most often happens to people who do not know they have diabetes or are not managing their diabetes properly.  °CAUSES  °Whether you have diabetes or not, there are other causes of hyperglycemia. Hyperglycemia can occur when you have diabetes, but it can also occur in other situations that you might not be as aware of, such as: °Diabetes °· If you have diabetes and are having problems controlling your blood glucose, hyperglycemia could occur because of some of the following reasons: °· Not following your meal plan. °· Not taking your diabetes medications or not taking it properly. °· Exercising less or doing less activity than you normally do. °· Being sick. °Pre-diabetes °· This cannot be ignored. Before people develop Type 2 diabetes, they almost always have "pre-diabetes." This is when your blood glucose levels are higher than normal, but not yet high enough to be diagnosed as diabetes. Research has shown that some long-term damage to the body, especially the heart and circulatory system, may already be occurring during pre-diabetes. If you take action to manage your blood glucose when you have pre-diabetes, you may delay or prevent Type 2 diabetes from developing. °Stress °· If you have diabetes, you may be "diet" controlled or on oral medications or insulin to control your diabetes. However, you may find that your blood glucose is higher than usual in the hospital whether you have diabetes or not. This is often referred to as "stress hyperglycemia." Stress can elevate your blood glucose. This happens because of hormones put out by the body during times of stress. If stress has been the cause of your high blood glucose, it can be followed regularly by your caregiver. That way he/she can make sure your hyperglycemia does not continue to get worse or progress to  diabetes. °Steroids °· Steroids are medications that act on the infection fighting system (immune system) to block inflammation or infection. One side effect can be a rise in blood glucose. Most people can produce enough extra insulin to allow for this rise, but for those who cannot, steroids make blood glucose levels go even higher. It is not unusual for steroid treatments to "uncover" diabetes that is developing. It is not always possible to determine if the hyperglycemia will go away after the steroids are stopped. A special blood test called an A1c is sometimes done to determine if your blood glucose was elevated before the steroids were started. °SYMPTOMS °· Thirsty. °· Frequent urination. °· Dry mouth. °· Blurred vision. °· Tired or fatigue. °· Weakness. °· Sleepy. °· Tingling in feet or leg. °DIAGNOSIS  °Diagnosis is made by monitoring blood glucose in one or all of the following ways: °· A1c test. This is a chemical found in your blood. °· Fingerstick blood glucose monitoring. °· Laboratory results. °TREATMENT  °First, knowing the cause of the hyperglycemia is important before the hyperglycemia can be treated. Treatment may include, but is not be limited to: °· Education. °· Change or adjustment in medications. °· Change or adjustment in meal plan. °· Treatment for an illness, infection, etc. °· More frequent blood glucose monitoring. °· Change in exercise plan. °· Decreasing or stopping steroids. °· Lifestyle changes. °HOME CARE INSTRUCTIONS  °· Test your blood glucose as directed. °· Exercise regularly. Your caregiver will give you instructions about exercise. Pre-diabetes or diabetes which comes on with stress is helped by exercising. °· Eat wholesome,   balanced meals. Eat often and at regular, fixed times. Your caregiver or nutritionist will give you a meal plan to guide your sugar intake.  Being at an ideal weight is important. If needed, losing as little as 10 to 15 pounds may help improve blood  glucose levels. SEEK MEDICAL CARE IF:   You have questions about medicine, activity, or diet.  You continue to have symptoms (problems such as increased thirst, urination, or weight gain). SEEK IMMEDIATE MEDICAL CARE IF:   You are vomiting or have diarrhea.  Your breath smells fruity.  You are breathing faster or slower.  You are very sleepy or incoherent.  You have numbness, tingling, or pain in your feet or hands.  You have chest pain.  Your symptoms get worse even though you have been following your caregiver's orders.  If you have any other questions or concerns. Document Released: 10/05/2000 Document Revised: 07/04/2011 Document Reviewed: 08/08/2011 [facility] Patient Information 2015 [place], [place]. This information is not intended to replace advice given to you by your health care provider. Make sure you discuss any questions you have with your health care provider.    Shortness of Breath Shortness of breath means you have trouble breathing. It could also mean that you have a medical problem. You should get immediate medical care for shortness of breath. CAUSES   Not enough oxygen in the air such as with high altitudes or a smoke-filled room.  Certain lung diseases, infections, or problems.  Heart disease or conditions, such as angina or heart failure.  Low red blood cells (anemia).  Poor physical fitness, which can cause shortness of breath when you exercise.  Chest or back injuries or stiffness.  Being overweight.  Smoking.  Anxiety, which can make you feel like you are not getting enough air. DIAGNOSIS  Serious medical problems can often be found during your physical exam. Tests may also be done to determine why you are having shortness of breath. Tests may include:  Chest X-rays.  Lung function tests.  Blood tests.  An electrocardiogram (ECG).  An ambulatory electrocardiogram. An ambulatory ECG records your heartbeat patterns over a 24-hour  period.  Exercise testing.  A transthoracic echocardiogram (TTE). During echocardiography, sound waves are used to evaluate how blood flows through your heart.  A transesophageal echocardiogram (TEE).  Imaging scans. Your health care provider may not be able to find a cause for your shortness of breath after your exam. In this case, it is important to have a follow-up exam with your health care provider as directed.  TREATMENT  Treatment for shortness of breath depends on the cause of your symptoms and can vary greatly. HOME CARE INSTRUCTIONS   Do not smoke. Smoking is a common cause of shortness of breath. If you smoke, ask for help to quit.  Avoid being around chemicals or things that may bother your breathing, such as paint fumes and dust.  Rest as needed. Slowly resume your usual activities.  If medicines were prescribed, take them as directed for the full length of time directed. This includes oxygen and any inhaled medicines.  Keep all follow-up appointments as directed by your health care provider. SEEK MEDICAL CARE IF:   Your condition does not improve in the time expected.  You have a hard time doing your normal activities even with rest.  You have any new symptoms. SEEK IMMEDIATE MEDICAL CARE IF:   Your shortness of breath gets worse.  You feel light-headed, faint, or develop a cough not  controlled with medicines.  You start coughing up blood.  You have pain with breathing.  You have chest pain or pain in your arms, shoulders, or abdomen.  You have a fever.  You are unable to walk up stairs or exercise the way you normally do. MAKE SURE YOU:  Understand these instructions.  Will watch your condition.  Will get help right away if you are not doing well or get worse. Document Released: 01/04/2001 Document Revised: 04/16/2013 Document Reviewed: 06/27/2011 [facility] Patient Information 2015 [place], [place]. This information is not intended to replace advice  given to you by your health care provider. Make sure you discuss any questions you have with your health care provider.

## 2015-01-17 LAB — URINE CULTURE

## 2015-01-19 DIAGNOSIS — N390 Urinary tract infection, site not specified: Secondary | ICD-10-CM | POA: Diagnosis not present

## 2015-01-19 DIAGNOSIS — J441 Chronic obstructive pulmonary disease with (acute) exacerbation: Secondary | ICD-10-CM | POA: Diagnosis not present

## 2015-01-19 DIAGNOSIS — Z09 Encounter for follow-up examination after completed treatment for conditions other than malignant neoplasm: Secondary | ICD-10-CM | POA: Diagnosis not present

## 2015-01-19 DIAGNOSIS — Z79899 Other long term (current) drug therapy: Secondary | ICD-10-CM | POA: Diagnosis not present

## 2015-01-19 DIAGNOSIS — E1165 Type 2 diabetes mellitus with hyperglycemia: Secondary | ICD-10-CM | POA: Diagnosis not present

## 2015-01-29 ENCOUNTER — Other Ambulatory Visit: Payer: Self-pay | Admitting: Cardiology

## 2015-02-02 DIAGNOSIS — I10 Essential (primary) hypertension: Secondary | ICD-10-CM | POA: Diagnosis not present

## 2015-02-02 DIAGNOSIS — J449 Chronic obstructive pulmonary disease, unspecified: Secondary | ICD-10-CM | POA: Diagnosis not present

## 2015-02-02 DIAGNOSIS — E1165 Type 2 diabetes mellitus with hyperglycemia: Secondary | ICD-10-CM | POA: Diagnosis not present

## 2015-02-02 DIAGNOSIS — Z23 Encounter for immunization: Secondary | ICD-10-CM | POA: Diagnosis not present

## 2015-02-23 ENCOUNTER — Encounter: Payer: Self-pay | Admitting: Cardiovascular Disease

## 2015-02-23 NOTE — Progress Notes (Signed)
Patient ID: [name], female   DOB: 1930/01/15, 81 y.o.   MRN: [id number]    02/23/2015 [name]   03-Feb-1930  [id number]  Primary Physician: Gwynneth Aliment, MD Primary Cardiologist: Dr. [name]  HPI:  The patient is a 81 y.o. female, f/u HTN  She has been out of amlodipine, coreg and digoxin    She was last seen by me  on 05/10/2012. She has a history of diastolic CHF, HTN, DM2, COPD with ongoing tobacco abuse and atrial fibrillation. She was taken off of Coumadin due to age and low body mass. Her last 2D echo was in 2012, demonstrating normal EF at 60-65% and Grade I diastolic dysfunction.   Other than being out of her medications, she presents to clinic with no other concerns or complaints. She denies chest pain, dyspnea, orthopnea, PND, LEE, weight gain, palpitations, dizziness, lightheaded, syncope/ near syncope. She continues to smoke but is making attempts to cut down.   Other than tachycardia, her EKG is negative for any rhythm or ischemic abnormalities.   Current Outpatient Prescriptions  Medication Sig Dispense Refill  . acetaminophen (TYLENOL) 500 MG tablet Take 1,000 mg by mouth every 6 (six) hours as needed for pain.    Marland Kitchen albuterol (PROVENTIL HFA;VENTOLIN HFA) 108 (90 BASE) MCG/ACT inhaler Inhale 1-2 puffs into the lungs every 6 (six) hours as needed for wheezing or shortness of breath.    Marland Kitchen amLODipine (NORVASC) 5 MG tablet Take 1 tablet (5 mg total) by mouth daily. 30 tablet 6  . Ascorbic Acid (VITAMIN C PO) Take 1 tablet by mouth daily.    Marland Kitchen aspirin EC 81 MG tablet Take 81 mg by mouth daily.    Marland Kitchen atorvastatin (LIPITOR) 10 MG tablet Take 10 mg by mouth daily at 6 PM.     . benazepril (LOTENSIN) 40 MG tablet Take 1 tablet (40 mg total) by mouth daily. 90 tablet 3  . CALCIUM PO Take 1 tablet by mouth daily.     . carvedilol (COREG) 3.125 MG tablet TAKE 1 TABLET BY MOUTH TWICE DAILY WITH A MEAL 60 tablet 6  . Cholecalciferol (VITAMIN D PO) Take 1 tablet by mouth  daily.    Marland Kitchen dextromethorphan-guaiFENesin (MUCINEX DM) 30-600 MG per 12 hr tablet Take 1 tablet by mouth 2 (two) times daily. (Patient not taking: Reported on 11/07/2014) 14 tablet 1  . DIGOX 125 MCG tablet TAKE 1 TABLET BY MOUTH EVERY DAY 30 tablet 0  . DIGOX 125 MCG tablet TAKE 1 TABLET BY MOUTH EVERY DAY 30 tablet 0  . fluticasone (FLONASE) 50 MCG/ACT nasal spray Place 2 sprays into the nose as needed for rhinitis.    . furosemide (LASIX) 20 MG tablet Take 20 mg by mouth daily.      Marland Kitchen glimepiride (AMARYL) 2 MG tablet Take 2 mg by mouth daily before breakfast.      . loratadine (CLARITIN) 10 MG tablet Take 10 mg by mouth daily as needed for allergies.    . Multiple Vitamin (MULTIVITAMIN) tablet Take 1 tablet by mouth daily.     Marland Kitchen POTASSIUM PO Take 1 tablet by mouth daily.     No current facility-administered medications for this visit.    No Known Allergies  Social History   Social History  . Marital Status: Divorced    Spouse Name: N/A  . Number of Children: 1  . Years of Education: N/A   Occupational History  .     Social History Main Topics  .  Smoking status: Current Every Day Smoker -- 0.50 packs/day for 64 years    Types: Cigarettes  . Smokeless tobacco: Never Used  . Alcohol Use: No  . Drug Use: No  . Sexual Activity: No   Other Topics Concern  . Not on file   Social History Narrative     Review of Systems: General: negative for chills, fever, night sweats or weight changes.  Cardiovascular: negative for chest pain, dyspnea on exertion, edema, orthopnea, palpitations, paroxysmal nocturnal dyspnea or shortness of breath Dermatological: negative for rash Respiratory: negative for cough or wheezing Urologic: negative for hematuria Abdominal: negative for nausea, vomiting, diarrhea, bright red blood per rectum, melena, or hematemesis Neurologic: negative for visual changes, syncope, or dizziness All other systems reviewed and are otherwise negative except as noted  above.    There were no vitals taken for this visit.  General appearance: alert, cooperative, cachectic and no distress Neck: no carotid bruit and no JVD Lungs: clear to auscultation bilaterally Heart: fast rate regular rhthym Pulses: 2+ and symmetric Skin: warm and dry Neurologic: Grossly normal  EKG Sinus tachycardia. HR 118 bpm  ASSESSMENT AND PLAN:   1. Chronic Diastolic HF: denies dyspnea. euvolemic on physical exam. Will need better BP control to prevent exacerbation. Will reorder meds.  2. HTN:  Discussed importance of daily medication compliance. She also continues to smoke and drinks caffeine daily (had 1 1/2 cups of regular coffee prior to appointment). We discussed eliminating nicotine and cutting back on caffeine consumption.   3. PAF: EKG demonstrates sinus tach, due to being off of Coreg and digoxin. Will reorder meds. No recent h/o palpitations. Anticoagulation was discontinued due to age and low body mass.   4. Tobacco use: smoking cessation was strongly recommended.    F/U in a year     [name]

## 2015-02-25 ENCOUNTER — Encounter: Payer: Medicare Other | Admitting: Cardiovascular Disease

## 2015-03-02 ENCOUNTER — Other Ambulatory Visit: Payer: Self-pay | Admitting: Cardiovascular Disease

## 2015-05-01 ENCOUNTER — Other Ambulatory Visit: Payer: Self-pay | Admitting: Cardiovascular Disease

## 2015-05-06 ENCOUNTER — Encounter: Payer: Self-pay | Admitting: Cardiovascular Disease

## 2015-05-06 NOTE — Progress Notes (Signed)
Patient ID: [name], female   DOB: 12-12-1929, 82 y.o.   MRN: [id number]    05/07/2015 [name]   13-Oct-1929  [id number]  Primary Physician: Gwynneth Aliment, MD Primary Cardiologist: Dr. [name]  HPI:  The patient is a 82 y.o.  Female,I have not seen since 2014  Seen by PA 12/2013 . At that time she  had been out of amlodipine, coreg and digoxin and needed refills . She presents to clinic with an elevated BP and HR at 170/90  and 118 respectively. She has a history of diastolic CHF, HTN, DM2, COPD with ongoing tobacco abuse and atrial fibrillation. She was taken off of Coumadin due to age and low body mass. Her last 2D echo was in 2012, demonstrating normal EF at 60-65% and Grade I diastolic dysfunction.   Other than tachycardia, her EKG is negative for any rhythm or ischemic abnormalities.   Seen in ER September 2016 with dehydration, hyperglycemia BS 468 and dyspnea.  Also had been Rx for UTI week before. Has FTT and memory issues worsening   Current Outpatient Prescriptions  Medication Sig Dispense Refill  . acetaminophen (TYLENOL) 500 MG tablet Take 1,000 mg by mouth every 6 (six) hours as needed for pain.    Marland Kitchen albuterol (PROVENTIL HFA;VENTOLIN HFA) 108 (90 BASE) MCG/ACT inhaler Inhale 1-2 puffs into the lungs every 6 (six) hours as needed for wheezing or shortness of breath.    Marland Kitchen amLODipine (NORVASC) 5 MG tablet Take 1 tablet (5 mg total) by mouth daily. 30 tablet 6  . Ascorbic Acid (VITAMIN C PO) Take 1 tablet by mouth daily.    Marland Kitchen aspirin EC 81 MG tablet Take 81 mg by mouth daily.    Marland Kitchen atorvastatin (LIPITOR) 10 MG tablet Take 10 mg by mouth daily at 6 PM.     . CALCIUM PO Take 1 tablet by mouth daily.     . Cholecalciferol (VITAMIN D PO) Take 1 tablet by mouth daily.    . fluticasone (FLONASE) 50 MCG/ACT nasal spray Place 2 sprays into the nose daily as needed for allergies or rhinitis.     . furosemide (LASIX) 20 MG tablet Take 20 mg by mouth daily.      Marland Kitchen glimepiride  (AMARYL) 2 MG tablet Take 2 mg by mouth daily before breakfast.      . loratadine (CLARITIN) 10 MG tablet Take 10 mg by mouth daily as needed for allergies.    . metFORMIN (GLUCOPHAGE-XR) 500 MG 24 hr tablet Take 500 mg by mouth daily.  5  . Multiple Vitamin (MULTIVITAMIN) tablet Take 1 tablet by mouth daily.     Marland Kitchen POTASSIUM PO Take 1 tablet by mouth daily.    . benazepril (LOTENSIN) 40 MG tablet Take 1 tablet (40 mg total) by mouth daily. 90 tablet 3  . carvedilol (COREG) 3.125 MG tablet TAKE 1 TABLET BY MOUTH TWICE DAILY WITH A MEAL 180 tablet 3  . digoxin (DIGOX) 0.125 MG tablet Take 1 tablet (125 mcg total) by mouth daily. 90 tablet 3   No current facility-administered medications for this visit.    No Known Allergies  Social History   Social History  . Marital Status: Divorced    Spouse Name: N/A  . Number of Children: 1  . Years of Education: N/A   Occupational History  .     Social History Main Topics  . Smoking status: Current Every Day Smoker -- 0.50 packs/day for 64 years  Types: Cigarettes  . Smokeless tobacco: Never Used  . Alcohol Use: No  . Drug Use: No  . Sexual Activity: No   Other Topics Concern  . Not on file   Social History Narrative     Review of Systems: General: negative for chills, fever, night sweats or weight changes.  Cardiovascular: negative for chest pain, dyspnea on exertion, edema, orthopnea, palpitations, paroxysmal nocturnal dyspnea or shortness of breath Dermatological: negative for rash Respiratory: negative for cough or wheezing Urologic: negative for hematuria Abdominal: negative for nausea, vomiting, diarrhea, bright red blood per rectum, melena, or hematemesis Neurologic: negative for visual changes, syncope, or dizziness All other systems reviewed and are otherwise negative except as noted above.    Blood pressure 160/80, pulse 88, height 5\' 2"  (1.575 m), weight 38.157 kg (84 lb 1.9 oz), SpO2 93 %.  Frail Thin elderly black  female  Affect appropriate HEENT: normal Neck supple with no adenopathy JVP normal no bruits no thyromegaly Lungs poor air movement no active wheezing flat diaphragm Heart:  S1/S2 no murmur, no rub, gallop or click PMI normal Abdomen: benighn, BS positve, no tenderness, no AAA no bruit.  No HSM or HJR Distal pulses intact with no bruits No edema Neuro non-focal Skin warm and dry No muscular weakness  EKG  01/13/15 Sinus tachycardia. HR 118 bpm  ASSESSMENT AND PLAN:   1. Chronic Diastolic HF: denies dyspnea. euvolemic on physical exam. Will need better BP control to prevent exacerbation.   2. HTN: improved with med compliance   3. PAF: EKG demonstrates sinus , due to being off of Coreg and digoxin. Will reorder meds. No recent h/o palpitations. Anticoagulation was discontinued due to age and low body mass.   4. Tobacco use: smoking cessation was strongly recommended.     PLAN    F/U with primary for FTT and COPD  Encouraged her to start Boost calorie supplement 3x/day.  Discussed advanced directives and her/daughter not sure What they would want to do regarding intubation.    [name]

## 2015-05-07 ENCOUNTER — Encounter: Payer: Self-pay | Admitting: Cardiovascular Disease

## 2015-05-07 ENCOUNTER — Ambulatory Visit (INDEPENDENT_AMBULATORY_CARE_PROVIDER_SITE_OTHER): Payer: Medicare Other | Admitting: Cardiovascular Disease

## 2015-05-07 VITALS — BP 160/80 | HR 88 | Ht 62.0 in | Wt 84.1 lb

## 2015-05-07 DIAGNOSIS — I5032 Chronic diastolic (congestive) heart failure: Secondary | ICD-10-CM

## 2015-05-07 DIAGNOSIS — I255 Ischemic cardiomyopathy: Secondary | ICD-10-CM

## 2015-05-07 MED ORDER — DIGOXIN 125 MCG PO TABS: 125.0000 ug | ORAL_TABLET | Freq: Every day | ORAL | Status: DC

## 2015-05-07 MED ORDER — CARVEDILOL 3.125 MG PO TABS: ORAL_TABLET | ORAL | Status: DC

## 2015-05-07 MED ORDER — BENAZEPRIL HCL 40 MG PO TABS: 40.0000 mg | ORAL_TABLET | Freq: Every day | ORAL | Status: DC

## 2015-05-07 NOTE — Patient Instructions (Signed)
Medication Instructions:  Your physician recommends that you continue on your current medications as directed. Please refer to the Current Medication list given to you today.   Labwork: NONE  Testing/Procedures: NONE  Follow-Up: Your physician wants you to follow-up in: 6 months with Dr Nishan. You will receive a reminder letter in the mail two months in advance. If you don't receive a letter, please call our office to schedule the follow-up appointment.  Any Other Special Instructions Will Be Listed Below (If Applicable).     If you need a refill on your cardiac medications before your next appointment, please call your pharmacy.   

## 2015-05-19 DIAGNOSIS — J320 Chronic maxillary sinusitis: Secondary | ICD-10-CM | POA: Diagnosis not present

## 2015-05-19 DIAGNOSIS — J301 Allergic rhinitis due to pollen: Secondary | ICD-10-CM | POA: Diagnosis not present

## 2015-05-19 DIAGNOSIS — J330 Polyp of nasal cavity: Secondary | ICD-10-CM | POA: Diagnosis not present

## 2015-05-19 DIAGNOSIS — J322 Chronic ethmoidal sinusitis: Secondary | ICD-10-CM | POA: Diagnosis not present

## 2015-05-19 DIAGNOSIS — B370 Candidal stomatitis: Secondary | ICD-10-CM | POA: Diagnosis not present

## 2015-05-19 DIAGNOSIS — J3081 Allergic rhinitis due to animal (cat) (dog) hair and dander: Secondary | ICD-10-CM | POA: Diagnosis not present

## 2015-05-19 DIAGNOSIS — J410 Simple chronic bronchitis: Secondary | ICD-10-CM | POA: Diagnosis not present

## 2015-05-26 ENCOUNTER — Other Ambulatory Visit: Payer: Self-pay | Admitting: Otolaryngology

## 2015-05-26 ENCOUNTER — Ambulatory Visit
Admission: RE | Admit: 2015-05-26 | Discharge: 2015-05-26 | Disposition: A | Discharge status: Home / Self Care | Payer: Medicare Other | Source: Ambulatory Visit | Attending: Otolaryngology | Admitting: Otolaryngology

## 2015-05-26 DIAGNOSIS — J040 Acute laryngitis: Secondary | ICD-10-CM | POA: Diagnosis not present

## 2015-05-26 DIAGNOSIS — R059 Cough, unspecified: Secondary | ICD-10-CM

## 2015-05-26 DIAGNOSIS — R05 Cough: Secondary | ICD-10-CM

## 2015-05-26 DIAGNOSIS — J322 Chronic ethmoidal sinusitis: Secondary | ICD-10-CM | POA: Diagnosis not present

## 2015-05-26 DIAGNOSIS — J410 Simple chronic bronchitis: Secondary | ICD-10-CM | POA: Diagnosis not present

## 2015-05-26 DIAGNOSIS — J320 Chronic maxillary sinusitis: Secondary | ICD-10-CM | POA: Diagnosis not present

## 2015-05-27 ENCOUNTER — Encounter: Payer: Self-pay | Admitting: Emergency Medicine

## 2015-05-27 ENCOUNTER — Ambulatory Visit (INDEPENDENT_AMBULATORY_CARE_PROVIDER_SITE_OTHER): Payer: Medicare Other | Admitting: Pulmonary Disease

## 2015-05-27 VITALS — BP 144/88 | HR 83 | Temp 97.0°F | Ht 63.0 in | Wt 82.2 lb

## 2015-05-27 DIAGNOSIS — R636 Underweight: Secondary | ICD-10-CM | POA: Insufficient documentation

## 2015-05-27 DIAGNOSIS — I5032 Chronic diastolic (congestive) heart failure: Secondary | ICD-10-CM

## 2015-05-27 DIAGNOSIS — E1165 Type 2 diabetes mellitus with hyperglycemia: Secondary | ICD-10-CM | POA: Diagnosis not present

## 2015-05-27 DIAGNOSIS — I255 Ischemic cardiomyopathy: Secondary | ICD-10-CM

## 2015-05-27 DIAGNOSIS — J449 Chronic obstructive pulmonary disease, unspecified: Secondary | ICD-10-CM | POA: Diagnosis not present

## 2015-05-27 DIAGNOSIS — I480 Paroxysmal atrial fibrillation: Secondary | ICD-10-CM

## 2015-05-27 MED ORDER — BUDESONIDE-FORMOTEROL FUMARATE 160-4.5 MCG/ACT IN AERO: 2.0000 | INHALATION_SPRAY | Freq: Two times a day (BID) | RESPIRATORY_TRACT | Status: DC

## 2015-05-27 MED ORDER — UMECLIDINIUM BROMIDE 62.5 MCG/INH IN AEPB: 1.0000 | INHALATION_SPRAY | Freq: Every day | RESPIRATORY_TRACT | Status: DC

## 2015-05-27 NOTE — Patient Instructions (Signed)
MrsRogers-- it was nice meeting you today!  Today we reviewed your history & showed you your CXR, breathing test results and oxygen report...  To help your breathing-- I would like you to do the following>>    QUIT ALL SMOKING by throwing away the last of those nasty cigarettes!!!    Start the SYMBICORT160- 2 sprays twice daily at breakfast & dinner...    Add the new INCRUSE-  One inhalation daily at lunchtime...    Continue the OTC MUCINEX 600mg  twice daily w/ lots of water...    Work hard to expectorate the phlegm & clear your lungs...  Let's also try to increase the exercise program- walking around the house, neighborhood, etc...  Call for any questions...  Let's plan a follow up visit in 6-8weeks, sooner if needed for problems.Marland KitchenMarland Kitchen

## 2015-05-28 ENCOUNTER — Encounter: Payer: Self-pay | Admitting: Pulmonary Disease

## 2015-05-28 ENCOUNTER — Other Ambulatory Visit: Payer: Self-pay | Admitting: Cardiovascular Disease

## 2015-05-28 NOTE — Progress Notes (Signed)
Subjective:     Patient ID: [name], female   DOB: November 23, 1929, 82 y.o.   MRN: [id number]  HPI ~  May 27, 2015:  Initial pulmonary consult w/ SN>      42 y/o BF referred by DrCrossley for a pulmonary evaluation due to dyspnea> He saw her yest in his office for f/u sinus drainage & cough, treated w/ ZPak/ then Doxy, he did a CXR showing Emphysema & referred her here;  Her PCP is Dr. Selena Batten [place]/ Dr. Dorothyann Peng ([facility]);  Pt states that she "got a cold" about 3-4 wks ago & that's when her symptoms started- rattling cough, grey sput, no hemoptysis, incr SOB w/ activity esp rushing/ stairs/ etc but she says ADLs are OK & most stenuous activ is mailbox & back (not exercising);  She is a current everyday smoker> started in early teens, smoked for 70 yrs up to 1ppd max, and cut back to 3 cig/d for the last several months (~65+pack-yr smoking hx);  She states that sh'e in the process of quitting- "I just take a few puffs, I have the discipline";  She denies hx prior lung problems> rare episode of bronchitis requiring antibiotics, no prev pneumonia, no hx TB or known exposure, no prev Hosp for resp illness;  No known hx of asbestos exposure or other toxins;  She has hx sinusitis & prev sinus surg at [facility] in the 80s;  She has signif medical issues including> Underwt w/ BMI=15, HBP, CAD, CHF, HL, DM, etc;  FamHx is neg for resp problems (see below)...    Current Meds>  AlbutHFA prn, Flonase/ Claritin, ASA81, Lanoxin0.125, Coreg3.125Bid, Amlod5, Lotensin40, Lasix20, Lip10, Metform500, Glimep2, Vits...   EXAM shows Afeb, VSS, O2sat=95% on RA at rest;  83#, 5'3"Tall, BMI<15;  HEENT- neg, mallamapti1;  Chest- congested cough, scat rhonchi, no w/r/consolidation;  Heart- RR w/o m/r/g;  Abd- thin, soft, neg;  Ext- w/o c/c/e; Neuro- nonfocal...  2DEcho 05/01/2010 showed norm LVF w/ EF=60-65% & no regional wall motion abn, Gr1DD, mild MR, +atrial septal aneurysm, incr PAsys=25mmHg  CT Angio Chest  09/04/14 in EPIC showed NEG for PE...   CXR in [facility] 05/26/15 showed norm heart size, COPD/ hyperinflated/ flattened diaph/ NAD, degen changes in Tspine...  Spirometry 05/27/15 showed FVC=1.38 (66%), FEV1=0.66 (44%), %1sec=48, mid-flows reduced at 23% predicted;  This is c/w severe airflow obstruction & GOLD Stage3 COPD...  Ambulatory Oximetry 05/27/15> O2sat=100% on RA at rest w/ pulse=78/min; she ambulated 2 Laps and stopped due to fatigue, lowest O2sat=98% w/ pulse=114/min... IMP>>    Dyspnea is multifactorial-- due to COPD/emphysema, smoking, heart dis, deconditioning/ sedentary lifestyle/ 82 y/o...    COPD/ emphysema    Cigarette smoker, still smoking 3cig/d, 65+pack-yr smoking hx    PulmHTN w/ 2DEcho in 2012 showing PAsys est~56mmHg    CARDIAC Hx>  followed by Walker Kehr for HBP, diastolicCHF, hx AFib    MEDICAL Hx>  follwed by PCP for HBP, HL, DM, FTT/ underwt, etc... PLAN>>    We reviewed her objective data and the Dx of COPD/emphysema (GOLD Stage3); she understands that she must quit all smoking, and rec to start regular inhaler use-- SYMBICORT160-2spBid, INCRUSE-once daily, +OTC MUCINEX600mg  1-2Bid, +nutritional supplements daily;  She needs regular f/u w/ her PCP to work on med compliance, nutritional support, etc... ROV recheck in 6wks.    Past Medical History  Diagnosis Date  . Cardiomyopathy     non-ischemic 04/2008 EF 45% cardiac MRI no scar  . Hypertension   .  Hypercholesterolemia   . CAD (coronary artery disease)   . CHF (congestive heart failure) (HCC)   . Dyspnea on exertion     chronic  . Type II diabetes mellitus (HCC)   . Sinus headache     "chronic"  . Stroke [facility]) 1997    "mild"  . Chronic sinusitis   . Depression   . COPD (chronic obstructive pulmonary disease) (HCC)   . Benign hypertensive renal disease     Past Surgical History  Procedure Laterality Date  . Carotid angiogram      2006  . Esophagogastroduodenoscopy      with foreign body removal  . Nasal  sinus surgery  ~1974;~ 1976  . Tonsillectomy      "when I was a chld"    Outpatient Encounter Prescriptions as of 05/27/2015  Medication Sig  . acetaminophen (TYLENOL) 500 MG tablet Take 1,000 mg by mouth every 6 (six) hours as needed for pain.  Marland Kitchen albuterol (PROVENTIL HFA;VENTOLIN HFA) 108 (90 BASE) MCG/ACT inhaler Inhale 1-2 puffs into the lungs every 6 (six) hours as needed for wheezing or shortness of breath.  Marland Kitchen amLODipine (NORVASC) 5 MG tablet Take 1 tablet (5 mg total) by mouth daily.  . Ascorbic Acid (VITAMIN C PO) Take 1 tablet by mouth daily.  Marland Kitchen aspirin EC 81 MG tablet Take 81 mg by mouth daily.  Marland Kitchen atorvastatin (LIPITOR) 10 MG tablet Take 10 mg by mouth daily at 6 PM.   . benazepril (LOTENSIN) 40 MG tablet Take 1 tablet (40 mg total) by mouth daily.  Marland Kitchen CALCIUM PO Take 1 tablet by mouth daily.   . carvedilol (COREG) 3.125 MG tablet TAKE 1 TABLET BY MOUTH TWICE DAILY WITH A MEAL  . Cholecalciferol (VITAMIN D PO) Take 1 tablet by mouth daily.  . digoxin (DIGOX) 0.125 MG tablet Take 1 tablet (125 mcg total) by mouth daily.  Marland Kitchen doxycycline (VIBRA-TABS) 100 MG tablet Take 100 mg by mouth 2 (two) times daily.  . fluconazole (DIFLUCAN) 100 MG tablet Take 100 mg by mouth daily.  . fluticasone (FLONASE) 50 MCG/ACT nasal spray Place 2 sprays into the nose daily as needed for allergies or rhinitis.   . furosemide (LASIX) 20 MG tablet Take 20 mg by mouth daily.    Marland Kitchen glimepiride (AMARYL) 2 MG tablet Take 2 mg by mouth daily before breakfast.    . loratadine (CLARITIN) 10 MG tablet Take 10 mg by mouth daily as needed for allergies.  . metFORMIN (GLUCOPHAGE-XR) 500 MG 24 hr tablet Take 500 mg by mouth daily.  . Multiple Vitamin (MULTIVITAMIN) tablet Take 1 tablet by mouth daily.   Marland Kitchen POTASSIUM PO Take 1 tablet by mouth daily.    No Known Allergies   Immunization History  Administered Date(s) Administered  . Influenza,inj,Quad PF,36+ Mos 01/24/2015  NEED TO CHECK w/ DrSanders about her [facility]  HISTORY...   Family History  Problem Relation Age of Onset  . Coronary artery disease      family history  . Heart disease Father   . Alcohol abuse Sister   . Alcohol abuse Brother     Social History   Social History  . Marital Status: Divorced    Spouse Name: N/A  . Number of Children: 1  . Years of Education: N/A   Occupational History  . retired    Social History Main Topics  . Smoking status: Current Every Day Smoker -- 0.75 packs/day for 64 years    Types: Cigarettes  .  Smokeless tobacco: Never Used  . Alcohol Use: No  . Drug Use: No  . Sexual Activity: No   Other Topics Concern  . Not on file   Social History Narrative    Current Medications, Allergies, Past Medical History, Past Surgical History, Family History, and Social History were reviewed in [employer] record.   Review of Systems             All symptoms NEG except where BOLDED >>  Constitutional:  F/C/S, fatigue, anorexia, unexpected weight change. HEENT:  HA, visual changes, hearing loss, earache, nasal symptoms, sore throat, mouth sores, hoarseness. Resp:  cough, sputum, hemoptysis; SOB, tightness, wheezing. Cardio:  CP, palpit, DOE, orthopnea, edema. GI:  N/V/D/C, blood in stool; reflux, abd pain, distention, gas. GU:  dysuria, freq, urgency, hematuria, flank pain, voiding difficulty. MS:  joint pain, swelling, tenderness, decr ROM; neck pain, back pain, etc. Neuro:  HA, tremors, seizures, dizziness, syncope, weakness, numbness, gait abn. Skin:  suspicious lesions or skin rash. Heme:  adenopathy, bruising, bleeding. Psyche:  confusion, agitation, sleep disturbance, hallucinations, anxiety, depression suicidal.   Objective:   Physical Exam       Vital Signs:  Reviewed...  General:  WD, underweight, 82 y/o BF in NAD; alert & oriented; pleasant & cooperative... HEENT:  [place]/AT; Conjunctiva- pink, Sclera- nonicteric, EOM-wnl, PERRLA, Fundi-benign; EACs-clear, TMs-wnl;  NOSE-sl red; THROAT-clear & wnl. Neck:  Supple w/ decr ROM; no JVD; normal carotid impulses w/o bruits; no thyromegaly or nodules palpated; no lymphadenopathy. Chest:  DecrBS, clear without wheezes, rales, or rhonchi heard. Heart:  Regular Rhythm; norm S1 & S2 without murmurs, rubs, or gallops detected. Abdomen:  Soft & nontender- no guarding or rebound; normal bowel sounds; no organomegaly or masses palpated. Ext:  decrROM; without deformities +arthritic changes; no varicose veins, +venous insuffic, no edema;  Pulses intact w/o bruits. Neuro:  No focal neuro deficits Derm:  No lesions noted; no rash etc. Lymph:  No cervical, supraclavicular, axillary, or inguinal adenopathy palpated.   Assessment:      IMP>>    Dyspnea is multifactorial-- due to COPD/emphysema, smoking, heart dis, deconditioning/ sedentary lifestyle/ 82 y/o...    COPD/ emphysema    Cigarette smoker, still smoking 3cig/d, 65+pack-yr smoking hx    PulmHTN w/ 2DEcho in 2012 showing PAsys est~44mmHg    CARDIAC Hx>  followed by Walker Kehr for HBP, diastolicCHF, hx AFib    MEDICAL Hx>  follwed by PCP for HBP, HL, DM, FTT/ underwt, etc... PLAN>>    We reviewed her objective data and the Dx of COPD/emphysema (GOLD Stage3); she understands that she must quit all smoking, and rec to start regular inhaler use-- SYMBICORT160-2spBid, INCRUSE-once daily, +OTC MUCINEX600mg  1-2Bid, +nutritional supplements daily;  She needs regular f/u w/ her PCP to work on med compliance, nutritional support, etc... ROV recheck in 6wks.     Plan:     Patient's Medications  New Prescriptions   BUDESONIDE-FORMOTEROL (SYMBICORT) 160-4.5 MCG/ACT INHALER    Inhale 2 puffs into the lungs 2 (two) times daily.   UMECLIDINIUM BROMIDE (INCRUSE ELLIPTA) 62.5 MCG/INH AEPB    Inhale 1 puff into the lungs daily.  Previous Medications   ACETAMINOPHEN (TYLENOL) 500 MG TABLET    Take 1,000 mg by mouth every 6 (six) hours as needed for pain.   ALBUTEROL (PROVENTIL  HFA;VENTOLIN HFA) 108 (90 BASE) MCG/ACT INHALER    Inhale 1-2 puffs into the lungs every 6 (six) hours as needed for wheezing or shortness of breath.  AMLODIPINE (NORVASC) 5 MG TABLET    Take 1 tablet (5 mg total) by mouth daily.   ASCORBIC ACID (VITAMIN C PO)    Take 1 tablet by mouth daily.   ASPIRIN EC 81 MG TABLET    Take 81 mg by mouth daily.   ATORVASTATIN (LIPITOR) 10 MG TABLET    Take 10 mg by mouth daily at 6 PM.    BENAZEPRIL (LOTENSIN) 40 MG TABLET    Take 1 tablet (40 mg total) by mouth daily.   CALCIUM PO    Take 1 tablet by mouth daily.    CARVEDILOL (COREG) 3.125 MG TABLET    TAKE 1 TABLET BY MOUTH TWICE DAILY WITH A MEAL   CHOLECALCIFEROL (VITAMIN D PO)    Take 1 tablet by mouth daily.   DIGOXIN (DIGOX) 0.125 MG TABLET    Take 1 tablet (125 mcg total) by mouth daily.   DOXYCYCLINE (VIBRA-TABS) 100 MG TABLET    Take 100 mg by mouth 2 (two) times daily.   FLUCONAZOLE (DIFLUCAN) 100 MG TABLET    Take 100 mg by mouth daily.   FLUTICASONE (FLONASE) 50 MCG/ACT NASAL SPRAY    Place 2 sprays into the nose daily as needed for allergies or rhinitis.    FUROSEMIDE (LASIX) 20 MG TABLET    Take 20 mg by mouth daily.     GLIMEPIRIDE (AMARYL) 2 MG TABLET    Take 2 mg by mouth daily before breakfast.     LORATADINE (CLARITIN) 10 MG TABLET    Take 10 mg by mouth daily as needed for allergies.   METFORMIN (GLUCOPHAGE-XR) 500 MG 24 HR TABLET    Take 500 mg by mouth daily.   MULTIPLE VITAMIN (MULTIVITAMIN) TABLET    Take 1 tablet by mouth daily.    POTASSIUM PO    Take 1 tablet by mouth daily.  Modified Medications   No medications on file  Discontinued Medications   No medications on file

## 2015-06-03 DIAGNOSIS — J322 Chronic ethmoidal sinusitis: Secondary | ICD-10-CM | POA: Diagnosis not present

## 2015-06-03 DIAGNOSIS — H6121 Impacted cerumen, right ear: Secondary | ICD-10-CM | POA: Diagnosis not present

## 2015-06-03 DIAGNOSIS — J410 Simple chronic bronchitis: Secondary | ICD-10-CM | POA: Diagnosis not present

## 2015-06-03 DIAGNOSIS — J320 Chronic maxillary sinusitis: Secondary | ICD-10-CM | POA: Diagnosis not present

## 2015-07-08 ENCOUNTER — Ambulatory Visit: Payer: Medicare Other | Admitting: Pulmonary Disease

## 2015-07-21 ENCOUNTER — Encounter: Payer: Self-pay | Admitting: Pulmonary Disease

## 2015-07-21 ENCOUNTER — Ambulatory Visit (INDEPENDENT_AMBULATORY_CARE_PROVIDER_SITE_OTHER): Payer: Medicare Other | Admitting: Pulmonary Disease

## 2015-07-21 VITALS — BP 138/82 | HR 55 | Temp 97.0°F | Ht 63.0 in | Wt 83.8 lb

## 2015-07-21 DIAGNOSIS — I480 Paroxysmal atrial fibrillation: Secondary | ICD-10-CM

## 2015-07-21 DIAGNOSIS — F17210 Nicotine dependence, cigarettes, uncomplicated: Secondary | ICD-10-CM

## 2015-07-21 DIAGNOSIS — I255 Ischemic cardiomyopathy: Secondary | ICD-10-CM | POA: Diagnosis not present

## 2015-07-21 DIAGNOSIS — J449 Chronic obstructive pulmonary disease, unspecified: Secondary | ICD-10-CM | POA: Diagnosis not present

## 2015-07-21 DIAGNOSIS — I5032 Chronic diastolic (congestive) heart failure: Secondary | ICD-10-CM | POA: Diagnosis not present

## 2015-07-21 DIAGNOSIS — Z720 Tobacco use: Secondary | ICD-10-CM

## 2015-07-21 DIAGNOSIS — R636 Underweight: Secondary | ICD-10-CM

## 2015-07-21 DIAGNOSIS — E1165 Type 2 diabetes mellitus with hyperglycemia: Secondary | ICD-10-CM

## 2015-07-21 NOTE — Patient Instructions (Signed)
Today we updated your med list in our EPIC system...    Continue your current medications the same...  Use the Symbicort 2 sprays twice daily & the Incruse once daily...  Use the MUCINEX (Guaifenesin) regularly>    Ok to take 1200mg  twice daily (2 of the 600mg  tabs twice daily, or 3 of the 400mg  puills twice daily)...    If you are using the generic 400mg  tabs> consider doing 2 tabs THREE times daily w/ extra fluids each time...  Call for any questions or if we can be of service in any way...  Let's plan a routine follow up visit in 4-6 months, sooner if needed for breathing problems.Marland KitchenMarland Kitchen

## 2015-07-21 NOTE — Progress Notes (Signed)
Subjective:     Patient ID: [name], female   DOB: January 25, 1930, 82 y.o.   MRN: [id number]  HPI  ~  May 27, 2015:  Initial pulmonary consult w/ SN>      60 y/o BF referred by DrCrossley for a pulmonary evaluation due to dyspnea> He saw her yest in his office for f/u sinus drainage & cough, treated w/ ZPak/ then Doxy, he did a CXR showing Emphysema & referred her here;  Her PCP is Dr. Selena Batten [place]/ Dr. Dorothyann Peng ([facility]);  Pt states that she "got a cold" about 3-4 wks ago & that's when her symptoms started- rattling cough, grey sput, no hemoptysis, incr SOB w/ activity esp rushing/ stairs/ etc but she says ADLs are OK & most stenuous activ is mailbox & back (not exercising);  She is a current everyday smoker> started in early teens, smoked for 70 yrs up to 1ppd max, and cut back to 3 cig/d for the last several months (~65+pack-yr smoking hx);  She states that sh'e in the process of quitting- "I just take a few puffs, I have the discipline";  She denies hx prior lung problems> rare episode of bronchitis requiring antibiotics, no prev pneumonia, no hx TB or known exposure, no prev Hosp for resp illness;  No known hx of asbestos exposure or other toxins;  She has hx sinusitis & prev sinus surg at [facility] in the 80s;  She has signif medical issues including> Underwt w/ BMI=15, HBP, CAD, CHF, HL, DM, etc;  FamHx is neg for resp problems (see below)...    Current Meds>  AlbutHFA prn, Flonase/ Claritin, ASA81, Lanoxin0.125, Coreg3.125Bid, Amlod5, Lotensin40, Lasix20, Lip10, Metform500, Glimep2, Vits...   EXAM shows Afeb, VSS, O2sat=95% on RA at rest;  83#, 5'3"Tall, BMI<15;  HEENT- neg, mallamapti1;  Chest- congested cough, scat rhonchi, no w/r/consolidation;  Heart- RR w/o m/r/g;  Abd- thin, soft, neg;  Ext- w/o c/c/e; Neuro- nonfocal...  2DEcho 05/01/2010 showed norm LVF w/ EF=60-65% & no regional wall motion abn, Gr1DD, mild MR, +atrial septal aneurysm, incr PAsys=32mmHg  CT Angio Chest  09/04/14 in [facility] showed NEG for PE...   CXR [facility] 05/26/15 showed norm heart size, COPD/ hyperinflated/ flattened diaph/ NAD, degen changes in Tspine...  Spirometry 05/27/15 showed FVC=1.38 (66%), FEV1=0.66 (44%), %1sec=48, mid-flows reduced at 23% predicted;  This is c/w severe airflow obstruction & GOLD Stage3 COPD...  Ambulatory Oximetry 05/27/15> O2sat=100% on RA at rest w/ pulse=78/min; she ambulated 2 Laps and stopped due to fatigue, lowest O2sat=98% w/ pulse=114/min... IMP>>    Dyspnea is multifactorial-- due to COPD/emphysema, smoking, heart dis, deconditioning/ sedentary lifestyle/ 82 y/o...    COPD/ emphysema    Cigarette smoker, still smoking 3cig/d, 65+pack-yr smoking hx    PulmHTN w/ 2DEcho in 2012 showing PAsys est~71mmHg    CARDIAC Hx>  followed by Walker Kehr for HBP, diastolicCHF, hx AFib    MEDICAL Hx>  follwed by PCP for HBP, HL, DM, FTT/ underwt, etc... PLAN>>    We reviewed her objective data and the Dx of COPD/emphysema (GOLD Stage3); she understands that she must quit all smoking, and rec to start regular inhaler use-- SYMBICORT160-2spBid, INCRUSE-once daily, +OTC MUCINEX600mg  1-2Bid, +nutritional supplements daily;  She needs regular f/u w/ her PCP to work on med compliance, nutritional support, etc... ROV recheck in 6wks.  ~  July 21, 2015:  82mo ROV w/ SN>  [name] reports that her SOB is improved on the Symbicort160-2spBid, Incruse once daily, ProventilHFA as needed ("  my breathing is much better" she says); she is not using the Mucinex and unfortunately still smoking a few cigs per day; she still has a mild cough w/ some thick beige sput production; she denies CP/ tightness/ f/c/s... We reviewed her prev eval/ CXR/ spirometry/ etc...  EXAM shows Afeb, VSS, O2sat=94% on RA at rest;  84#, 5'3"Tall, BMI<15;  HEENT- neg, mallamapti2;  Chest- congested cough, decrBS, scat rhonchi, no w/r/consolidation;  Heart- RR w/o m/r/g;  Abd- thin, soft, neg;  Ext- w/o c/c/e...  IMP/PLAN>>  She  is again advised to quit all smoking! We reviewed med regimen w/ Symbicort160, Incruse, Mucinex, AlbutHFA rescue inhaler, etc;  Reminded to incr exercise program, walk regularly, eat better and gain some weight...    Past Medical History  Diagnosis Date  . Cardiomyopathy     non-ischemic 04/2008 EF 45% cardiac MRI no scar  . Hypertension   . Hypercholesterolemia   . CAD (coronary artery disease)   . CHF (congestive heart failure) (HCC)   . Dyspnea on exertion     chronic  . Type II diabetes mellitus (HCC)   . Sinus headache     "chronic"  . Stroke [facility]) 1997    "mild"  . Chronic sinusitis   . Depression   . COPD (chronic obstructive pulmonary disease) (HCC)   . Benign hypertensive renal disease     Past Surgical History  Procedure Laterality Date  . Carotid angiogram      2006  . Esophagogastroduodenoscopy      with foreign body removal  . Nasal sinus surgery  ~1974;~ 1976  . Tonsillectomy      "when I was a chld"    Outpatient Encounter Prescriptions as of 07/21/2015  Medication Sig  . acetaminophen (TYLENOL) 500 MG tablet Take 1,000 mg by mouth every 6 (six) hours as needed for pain.  Marland Kitchen albuterol (PROVENTIL HFA;VENTOLIN HFA) 108 (90 BASE) MCG/ACT inhaler Inhale 1-2 puffs into the lungs every 6 (six) hours as needed for wheezing or shortness of breath.  Marland Kitchen amLODipine (NORVASC) 5 MG tablet Take 1 tablet (5 mg total) by mouth daily.  . Ascorbic Acid (VITAMIN C PO) Take 1 tablet by mouth daily.  Marland Kitchen aspirin EC 81 MG tablet Take 81 mg by mouth daily.  Marland Kitchen atorvastatin (LIPITOR) 10 MG tablet Take 10 mg by mouth daily at 6 PM.   . benazepril (LOTENSIN) 40 MG tablet Take 1 tablet (40 mg total) by mouth daily.  . budesonide-formoterol (SYMBICORT) 160-4.5 MCG/ACT inhaler Inhale 2 puffs into the lungs 2 (two) times daily.  Marland Kitchen CALCIUM PO Take 1 tablet by mouth daily.   . carvedilol (COREG) 3.125 MG tablet TAKE 1 TABLET BY MOUTH TWICE DAILY WITH A MEAL  . Cholecalciferol (VITAMIN D PO)  Take 1 tablet by mouth daily.  . digoxin (DIGOX) 0.125 MG tablet Take 1 tablet (125 mcg total) by mouth daily.  . fluticasone (FLONASE) 50 MCG/ACT nasal spray Place 2 sprays into the nose daily as needed for allergies or rhinitis.   . furosemide (LASIX) 20 MG tablet Take 20 mg by mouth daily.    Marland Kitchen glimepiride (AMARYL) 2 MG tablet Take 2 mg by mouth daily before breakfast.    . loratadine (CLARITIN) 10 MG tablet Take 10 mg by mouth daily as needed for allergies.  . metFORMIN (GLUCOPHAGE-XR) 500 MG 24 hr tablet Take 500 mg by mouth daily.  . Multiple Vitamin (MULTIVITAMIN) tablet Take 1 tablet by mouth daily.   Marland Kitchen POTASSIUM PO  Take 1 tablet by mouth daily.  Marland Kitchen Umeclidinium Bromide (INCRUSE ELLIPTA) 62.5 MCG/INH AEPB Inhale 1 puff into the lungs daily.  . [DISCONTINUED] doxycycline (VIBRA-TABS) 100 MG tablet Take 100 mg by mouth 2 (two) times daily.  . [DISCONTINUED] fluconazole (DIFLUCAN) 100 MG tablet Take 100 mg by mouth daily.   No facility-administered encounter medications on file as of 07/21/2015.    No Known Allergies   Immunization History  Administered Date(s) Administered  . Influenza,inj,Quad PF,36+ Mos 01/24/2015  . Pneumococcal-Unspecified 12/25/2014  NEED TO CHECK w/ DrSanders about her [facility] HISTORY...   Family History  Problem Relation Age of Onset  . Coronary artery disease      family history  . Heart disease Father   . Alcohol abuse Sister   . Alcohol abuse Brother     Social History   Social History  . Marital Status: Divorced    Spouse Name: N/A  . Number of Children: 1  . Years of Education: N/A   Occupational History  . retired    Social History Main Topics  . Smoking status: Current Every Day Smoker -- 0.75 packs/day for 64 years    Types: Cigarettes  . Smokeless tobacco: Never Used     Comment: 1-4 cigs per day   . Alcohol Use: No  . Drug Use: No  . Sexual Activity: No   Other Topics Concern  . Not on file   Social History Narrative     Current Medications, Allergies, Past Medical History, Past Surgical History, Family History, and Social History were reviewed in [employer] record.   Review of Systems             All symptoms NEG except where BOLDED >>  Constitutional:  F/C/S, fatigue, anorexia, unexpected weight change. HEENT:  HA, visual changes, hearing loss, earache, nasal symptoms, sore throat, mouth sores, hoarseness. Resp:  cough, sputum, hemoptysis; SOB, tightness, wheezing. Cardio:  CP, palpit, DOE, orthopnea, edema. GI:  N/V/D/C, blood in stool; reflux, abd pain, distention, gas. GU:  dysuria, freq, urgency, hematuria, flank pain, voiding difficulty. MS:  joint pain, swelling, tenderness, decr ROM; neck pain, back pain, etc. Neuro:  HA, tremors, seizures, dizziness, syncope, weakness, numbness, gait abn. Skin:  suspicious lesions or skin rash. Heme:  adenopathy, bruising, bleeding. Psyche:  confusion, agitation, sleep disturbance, hallucinations, anxiety, depression suicidal.   Objective:   Physical Exam       Vital Signs:  Reviewed...  General:  WD, underweight, 82 y/o BF in NAD; alert & oriented; pleasant & cooperative... HEENT:  [place]/AT; Conjunctiva- pink, Sclera- nonicteric, EOM-wnl, PERRLA, Fundi-benign; EACs-clear, TMs-wnl; NOSE-sl red; THROAT-clear & wnl. Neck:  Supple w/ decr ROM; no JVD; normal carotid impulses w/o bruits; no thyromegaly or nodules palpated; no lymphadenopathy. Chest:  DecrBS, clear without wheezes, rales, or rhonchi heard. Heart:  Regular Rhythm; norm S1 & S2 without murmurs, rubs, or gallops detected. Abdomen:  Soft & nontender- no guarding or rebound; normal bowel sounds; no organomegaly or masses palpated. Ext:  decrROM; without deformities +arthritic changes; no varicose veins, +venous insuffic, no edema;  Pulses intact w/o bruits. Neuro:  No focal neuro deficits Derm:  No lesions noted; no rash etc. Lymph:  No cervical, supraclavicular, axillary,  or inguinal adenopathy palpated.   Assessment:      IMP>>    Dyspnea is multifactorial-- due to COPD/emphysema, smoking, heart dis, deconditioning/ sedentary lifestyle/ 82 y/o...    COPD/ emphysema    Cigarette smoker, still smoking 3cig/d, 65+pack-yr  smoking hx    PulmHTN w/ 2DEcho in 2012 showing PAsys est~34mmHg    CARDIAC Hx>  followed by Walker Kehr for HBP, diastolicCHF, hx AFib    MEDICAL Hx>  follwed by PCP for HBP, HL, DM, FTT/ underwt, etc...  PLAN>> 05/27/15>   We reviewed her objective data and the Dx of COPD/emphysema (GOLD Stage3); she understands that she must quit all smoking, and rec to start regular inhaler use-- SYMBICORT160-2spBid, INCRUSE-once daily, +OTC MUCINEX600mg  1-2Bid, +nutritional supplements daily;  She needs regular f/u w/ her PCP to work on med compliance, nutritional support, etc... ROV recheck in 6wks. 3/28>   She is again advised to quit all smoking! We reviewed med regimen w/ Symbicort160, Incruse, Mucinex, AlbutHFA rescue inhaler, etc;  Reminded to incr exercise program, walk regularly, eat better and gain some weight...     Plan:     Patient's Medications  New Prescriptions   No medications on file  Previous Medications   ACETAMINOPHEN (TYLENOL) 500 MG TABLET    Take 1,000 mg by mouth every 6 (six) hours as needed for pain.   ALBUTEROL (PROVENTIL HFA;VENTOLIN HFA) 108 (90 BASE) MCG/ACT INHALER    Inhale 1-2 puffs into the lungs every 6 (six) hours as needed for wheezing or shortness of breath.   AMLODIPINE (NORVASC) 5 MG TABLET    Take 1 tablet (5 mg total) by mouth daily.   ASCORBIC ACID (VITAMIN C PO)    Take 1 tablet by mouth daily.   ASPIRIN EC 81 MG TABLET    Take 81 mg by mouth daily.   ATORVASTATIN (LIPITOR) 10 MG TABLET    Take 10 mg by mouth daily at 6 PM.    BENAZEPRIL (LOTENSIN) 40 MG TABLET    Take 1 tablet (40 mg total) by mouth daily.   BUDESONIDE-FORMOTEROL (SYMBICORT) 160-4.5 MCG/ACT INHALER    Inhale 2 puffs into the lungs 2 (two)  times daily.   CALCIUM PO    Take 1 tablet by mouth daily.    CARVEDILOL (COREG) 3.125 MG TABLET    TAKE 1 TABLET BY MOUTH TWICE DAILY WITH A MEAL   CHOLECALCIFEROL (VITAMIN D PO)    Take 1 tablet by mouth daily.   DIGOXIN (DIGOX) 0.125 MG TABLET    Take 1 tablet (125 mcg total) by mouth daily.   FLUTICASONE (FLONASE) 50 MCG/ACT NASAL SPRAY    Place 2 sprays into the nose daily as needed for allergies or rhinitis.    FUROSEMIDE (LASIX) 20 MG TABLET    Take 20 mg by mouth daily.     GLIMEPIRIDE (AMARYL) 2 MG TABLET    Take 2 mg by mouth daily before breakfast.     LORATADINE (CLARITIN) 10 MG TABLET    Take 10 mg by mouth daily as needed for allergies.   METFORMIN (GLUCOPHAGE-XR) 500 MG 24 HR TABLET    Take 500 mg by mouth daily.   MULTIPLE VITAMIN (MULTIVITAMIN) TABLET    Take 1 tablet by mouth daily.    POTASSIUM PO    Take 1 tablet by mouth daily.   UMECLIDINIUM BROMIDE (INCRUSE ELLIPTA) 62.5 MCG/INH AEPB    Inhale 1 puff into the lungs daily.  Modified Medications   No medications on file  Discontinued Medications   DOXYCYCLINE (VIBRA-TABS) 100 MG TABLET    Take 100 mg by mouth 2 (two) times daily.   FLUCONAZOLE (DIFLUCAN) 100 MG TABLET    Take 100 mg by mouth daily.

## 2015-11-24 ENCOUNTER — Ambulatory Visit: Payer: Medicare Other | Admitting: Pulmonary Disease

## 2016-03-07 DIAGNOSIS — J0100 Acute maxillary sinusitis, unspecified: Secondary | ICD-10-CM | POA: Diagnosis not present

## 2016-03-07 DIAGNOSIS — J449 Chronic obstructive pulmonary disease, unspecified: Secondary | ICD-10-CM | POA: Diagnosis not present

## 2016-03-07 DIAGNOSIS — I10 Essential (primary) hypertension: Secondary | ICD-10-CM | POA: Diagnosis not present

## 2016-03-07 DIAGNOSIS — Z23 Encounter for immunization: Secondary | ICD-10-CM | POA: Diagnosis not present

## 2016-03-07 DIAGNOSIS — I482 Chronic atrial fibrillation: Secondary | ICD-10-CM | POA: Diagnosis not present

## 2016-03-07 DIAGNOSIS — E119 Type 2 diabetes mellitus without complications: Secondary | ICD-10-CM | POA: Diagnosis not present

## 2016-03-07 DIAGNOSIS — Z7984 Long term (current) use of oral hypoglycemic drugs: Secondary | ICD-10-CM | POA: Diagnosis not present

## 2016-03-07 DIAGNOSIS — Z79899 Other long term (current) drug therapy: Secondary | ICD-10-CM | POA: Diagnosis not present

## 2016-03-30 DIAGNOSIS — I10 Essential (primary) hypertension: Secondary | ICD-10-CM | POA: Diagnosis not present

## 2016-03-30 DIAGNOSIS — M7110 Other infective bursitis, unspecified site: Secondary | ICD-10-CM | POA: Diagnosis not present

## 2016-04-01 DIAGNOSIS — M7110 Other infective bursitis, unspecified site: Secondary | ICD-10-CM | POA: Diagnosis not present

## 2016-04-01 DIAGNOSIS — I482 Chronic atrial fibrillation: Secondary | ICD-10-CM | POA: Diagnosis not present

## 2016-04-01 DIAGNOSIS — Z7984 Long term (current) use of oral hypoglycemic drugs: Secondary | ICD-10-CM | POA: Diagnosis not present

## 2016-04-01 DIAGNOSIS — E119 Type 2 diabetes mellitus without complications: Secondary | ICD-10-CM | POA: Diagnosis not present

## 2016-04-21 ENCOUNTER — Telehealth: Payer: Self-pay | Admitting: Pulmonary Disease

## 2016-04-21 MED ORDER — BUDESONIDE-FORMOTEROL FUMARATE 160-4.5 MCG/ACT IN AERO: 2.0000 | INHALATION_SPRAY | Freq: Two times a day (BID) | RESPIRATORY_TRACT | 0 refills | Status: DC

## 2016-04-21 NOTE — Telephone Encounter (Signed)
Called and spoke with ramona and she is aware of 2 samples of the symbicort that has been left up front.

## 2016-04-23 ENCOUNTER — Emergency Department (HOSPITAL_COMMUNITY): Payer: Medicare Other

## 2016-04-23 ENCOUNTER — Encounter (HOSPITAL_COMMUNITY): Payer: Self-pay | Admitting: Emergency Medicine

## 2016-04-23 ENCOUNTER — Emergency Department (HOSPITAL_COMMUNITY)
Admission: EM | Admit: 2016-04-23 | Discharge: 2016-04-23 | Disposition: A | Discharge status: Home / Self Care | Payer: Medicare Other | Attending: Emergency Medicine | Admitting: Emergency Medicine

## 2016-04-23 DIAGNOSIS — R0600 Dyspnea, unspecified: Secondary | ICD-10-CM | POA: Insufficient documentation

## 2016-04-23 DIAGNOSIS — I2510 Atherosclerotic heart disease of native coronary artery without angina pectoris: Secondary | ICD-10-CM | POA: Insufficient documentation

## 2016-04-23 DIAGNOSIS — Z79899 Other long term (current) drug therapy: Secondary | ICD-10-CM | POA: Diagnosis not present

## 2016-04-23 DIAGNOSIS — Z7984 Long term (current) use of oral hypoglycemic drugs: Secondary | ICD-10-CM | POA: Insufficient documentation

## 2016-04-23 DIAGNOSIS — I110 Hypertensive heart disease with heart failure: Secondary | ICD-10-CM | POA: Insufficient documentation

## 2016-04-23 DIAGNOSIS — Z7982 Long term (current) use of aspirin: Secondary | ICD-10-CM | POA: Insufficient documentation

## 2016-04-23 DIAGNOSIS — J449 Chronic obstructive pulmonary disease, unspecified: Secondary | ICD-10-CM | POA: Diagnosis not present

## 2016-04-23 DIAGNOSIS — E11649 Type 2 diabetes mellitus with hypoglycemia without coma: Secondary | ICD-10-CM | POA: Insufficient documentation

## 2016-04-23 DIAGNOSIS — R05 Cough: Secondary | ICD-10-CM | POA: Diagnosis not present

## 2016-04-23 DIAGNOSIS — R0602 Shortness of breath: Secondary | ICD-10-CM | POA: Diagnosis present

## 2016-04-23 DIAGNOSIS — Z8673 Personal history of transient ischemic attack (TIA), and cerebral infarction without residual deficits: Secondary | ICD-10-CM | POA: Diagnosis not present

## 2016-04-23 DIAGNOSIS — F17210 Nicotine dependence, cigarettes, uncomplicated: Secondary | ICD-10-CM | POA: Insufficient documentation

## 2016-04-23 DIAGNOSIS — E162 Hypoglycemia, unspecified: Secondary | ICD-10-CM

## 2016-04-23 DIAGNOSIS — I509 Heart failure, unspecified: Secondary | ICD-10-CM | POA: Insufficient documentation

## 2016-04-23 LAB — URINALYSIS, ROUTINE W REFLEX MICROSCOPIC
BILIRUBIN URINE: NEGATIVE
Glucose, UA: NEGATIVE mg/dL
HGB URINE DIPSTICK: NEGATIVE
Ketones, ur: NEGATIVE mg/dL
Nitrite: NEGATIVE
PH: 5 (ref 5.0–8.0)
Protein, ur: NEGATIVE mg/dL
SPECIFIC GRAVITY, URINE: 1.011 (ref 1.005–1.030)

## 2016-04-23 LAB — CBC
HEMATOCRIT: 40.3 % (ref 36.0–46.0)
Hemoglobin: 13.2 g/dL (ref 12.0–15.0)
MCH: 31.7 pg (ref 26.0–34.0)
MCHC: 32.8 g/dL (ref 30.0–36.0)
MCV: 96.9 fL (ref 78.0–100.0)
Platelets: 192 10*3/uL (ref 150–400)
RBC: 4.16 MIL/uL (ref 3.87–5.11)
RDW: 13.3 % (ref 11.5–15.5)
WBC: 13.2 10*3/uL — AB (ref 4.0–10.5)

## 2016-04-23 LAB — BASIC METABOLIC PANEL
Anion gap: 12 (ref 5–15)
BUN: 17 mg/dL (ref 6–20)
CHLORIDE: 95 mmol/L — AB (ref 101–111)
CO2: 27 mmol/L (ref 22–32)
Calcium: 9.5 mg/dL (ref 8.9–10.3)
Creatinine, Ser: 0.81 mg/dL (ref 0.44–1.00)
GFR calc Af Amer: >60 mL/min (ref >60)
GFR calc non Af Amer: >60 mL/min (ref >60)
Glucose, Bld: 64 mg/dL — ABNORMAL LOW (ref 65–99)
POTASSIUM: 4.5 mmol/L (ref 3.5–5.1)
SODIUM: 134 mmol/L — AB (ref 135–145)

## 2016-04-23 LAB — CBG MONITORING, ED
GLUCOSE-CAPILLARY: 83 mg/dL (ref 65–99)
GLUCOSE-CAPILLARY: 111 mg/dL — AB (ref 65–99)
Glucose-Capillary: 45 mg/dL — ABNORMAL LOW (ref 65–99)

## 2016-04-23 LAB — I-STAT TROPONIN, ED: Troponin i, poc: 0.01 ng/mL (ref 0.00–0.08)

## 2016-04-23 LAB — DIGOXIN LEVEL: Digoxin Level: 1.2 ng/mL (ref 0.8–2.0)

## 2016-04-23 LAB — BRAIN NATRIURETIC PEPTIDE: B NATRIURETIC PEPTIDE 5: 72.4 pg/mL (ref 0.0–100.0)

## 2016-04-23 NOTE — Discharge Instructions (Signed)
Followup with your doctor as needed

## 2016-04-23 NOTE — ED Notes (Signed)
Pt given OJ and graham crackers with PB for low Bg. Inserted peripheral Iv. MD made aware.

## 2016-04-23 NOTE — ED Provider Notes (Signed)
[facility]-EMERGENCY DEPT Provider Note   CSN: [id number] Arrival date & time: 04/23/16  1518     History   Chief Complaint Chief Complaint  Patient presents with  . Shortness of Breath    HPI [name] is a 82 y.o. female.  HPI Patient presents with shortness of breath. States she was feeling a little short of breath today. No fevers. She had occasional cough. No chest pain. Patient's daughter answers many of the questions. No swelling or legs. Has had some mild confusion. Has been on antibiotics for a bursitis in her right elbow. Blood pressure reportedly been a little higher today. No fevers. No dysuria. She has had a decent appetite. They've had some change in her medications recently an attempt to get her sugar under control and her blood pressure under control. She had a couple falls over the last week but did not hit her head.   Past Medical History:  Diagnosis Date  . Benign hypertensive renal disease   . CAD (coronary artery disease)   . Cardiomyopathy    non-ischemic 04/2008 EF 45% cardiac MRI no scar  . CHF (congestive heart failure) (HCC)   . Chronic sinusitis   . COPD (chronic obstructive pulmonary disease) (HCC)   . Depression   . Dyspnea on exertion    chronic  . Hypercholesterolemia   . Hypertension   . Sinus headache    "chronic"  . Stroke [facility]) 1997   "mild"  . Type II diabetes mellitus [facility])     Patient Active Problem List   Diagnosis Date Noted  . Cigarette smoker 07/21/2015  . COPD with chronic bronchitis and emphysema (HCC) 05/27/2015  . Underweight 05/27/2015  . Chronic diastolic heart failure (HCC) 11/24/2011  . HTN (hypertension), malignant 11/08/2011  . Viral illness 11/08/2011  . DYSURIA 05/13/2010  . PAROXYSMAL ATRIAL FIBRILLATION 11/18/2009  . PALPITATIONS, OCCASIONAL 10/14/2008  . Type 2 diabetes mellitus with hyperglycemia (HCC) 09/12/2008  . HYPERCHOLESTEROLEMIA 09/12/2008  . CARDIOMYOPATHY 09/12/2008  . CAROTID ARTERY DISEASE  09/12/2008  . DYSPNEA ON EXERTION 09/12/2008  . RESTLESS LEG SYNDROME, HX OF 09/12/2008  . CONGESTIVE HEART FAILURE, HX OF 09/12/2008    Past Surgical History:  Procedure Laterality Date  . carotid angiogram     2006  . ESOPHAGOGASTRODUODENOSCOPY     with foreign body removal  . NASAL SINUS SURGERY  ~1974;~ 1976  . TONSILLECTOMY     "when I was a chld"    OB History    No data available       Home Medications    Prior to Admission medications   Medication Sig Start Date End Date Taking? Authorizing Provider  acetaminophen (TYLENOL) 500 MG tablet Take 1,000 mg by mouth every 6 (six) hours as needed for pain.    Historical Provider, MD  albuterol (PROVENTIL HFA;VENTOLIN HFA) 108 (90 BASE) MCG/ACT inhaler Inhale 1-2 puffs into the lungs every 6 (six) hours as needed for wheezing or shortness of breath.    Historical Provider, MD  amLODipine (NORVASC) 5 MG tablet Take 1 tablet (5 mg total) by mouth daily. 12/25/13   Brittainy Sherlynn Carbon, PA-C  Ascorbic Acid (VITAMIN C PO) Take 1 tablet by mouth daily.    Historical Provider, MD  aspirin EC 81 MG tablet Take 81 mg by mouth daily.    Historical Provider, MD  atorvastatin (LIPITOR) 10 MG tablet Take 10 mg by mouth daily at 6 PM.  09/01/14   Historical Provider, MD  benazepril (LOTENSIN)  40 MG tablet Take 1 tablet (40 mg total) by mouth daily. 05/07/15   Wendall StadePeter C Nishan, MD  budesonide-formoterol [facility](SYMBICORT) 160-4.5 MCG/ACT inhaler Inhale 2 puffs into the lungs 2 (two) times daily. 05/27/15   Michele McalpineScott M Nadel, MD  budesonide-formoterol (SYMBICORT) 160-4.5 MCG/ACT inhaler Inhale 2 puffs into the lungs 2 (two) times daily. 04/21/16   Michele McalpineScott M Nadel, MD  CALCIUM PO Take 1 tablet by mouth daily.     Historical Provider, MD  carvedilol (COREG) 3.125 MG tablet TAKE 1 TABLET BY MOUTH TWICE DAILY WITH A MEAL 05/07/15   Wendall StadePeter C Nishan, MD  Cholecalciferol (VITAMIN D PO) Take 1 tablet by mouth daily.    Historical Provider, MD  digoxin (DIGOX) 0.125 MG tablet  Take 1 tablet (125 mcg total) by mouth daily. 05/07/15   Wendall StadePeter C Nishan, MD  fluticasone (FLONASE) 50 MCG/ACT nasal spray Place 2 sprays into the nose daily as needed for allergies or rhinitis.     Historical Provider, MD  furosemide (LASIX) 20 MG tablet Take 20 mg by mouth daily.      Historical Provider, MD  glimepiride (AMARYL) 2 MG tablet Take 2 mg by mouth daily before breakfast.      Historical Provider, MD  loratadine (CLARITIN) 10 MG tablet Take 10 mg by mouth daily as needed for allergies.    Historical Provider, MD  metFORMIN (GLUCOPHAGE-XR) 500 MG 24 hr tablet Take 500 mg by mouth daily. 03/13/15   Historical Provider, MD  Multiple Vitamin (MULTIVITAMIN) tablet Take 1 tablet by mouth daily.     Historical Provider, MD  POTASSIUM PO Take 1 tablet by mouth daily.    Historical Provider, MD  Umeclidinium Bromide (INCRUSE ELLIPTA) 62.5 MCG/INH AEPB Inhale 1 puff into the lungs daily. 05/27/15   Michele McalpineScott M Nadel, MD    Family History Family History  Problem Relation Age of Onset  . Heart disease Father   . Alcohol abuse Sister   . Alcohol abuse Brother   . Coronary artery disease      family history    Social History Social History  Substance Use Topics  . Smoking status: Current Every Day Smoker    Packs/day: 0.75    Years: 64.00    Types: Cigarettes  . Smokeless tobacco: Never Used     Comment: 1-4 cigs per day   . Alcohol use No     Allergies   Patient has no known allergies.   Review of Systems Review of Systems  Constitutional: Negative for appetite change.  HENT: Negative for congestion.   Respiratory: Positive for cough and shortness of breath.   Cardiovascular: Negative for chest pain.  Gastrointestinal: Negative for abdominal pain.  Genitourinary: Negative for dysuria.  Musculoskeletal: Negative for back pain.  Skin: Negative for wound.  Neurological: Negative for numbness and headaches.  Hematological: Negative for adenopathy.  Psychiatric/Behavioral:  Positive for confusion.     Physical Exam Updated Vital Signs BP 145/67   Pulse 85   Temp 98.1 F (36.7 C) (Oral)   Resp 22   Ht 5\' 2"  (1.575 m)   Wt 84 lb (38.1 kg)   SpO2 98%   BMI 15.36 kg/m   Physical Exam  Constitutional: She is oriented to person, place, and time. She appears well-developed.  HENT:  Head: Atraumatic.  Eyes: EOM are normal.  Neck: Neck supple.  Cardiovascular: Normal rate.   Pulmonary/Chest: Effort normal.  Slightly harsh sounds with some wheezes.  Abdominal: Soft.  Musculoskeletal: She exhibits  no edema.  Neurological: She is alert and oriented to person, place, and time.  Skin: Skin is warm.     ED Treatments / Results  Labs (all labs ordered are listed, but only abnormal results are displayed) Labs Reviewed  BASIC METABOLIC PANEL - Abnormal; Notable for the following:       Result Value   Sodium 134 (*)    Chloride 95 (*)    Glucose, Bld 64 (*)    All other components within normal limits  CBC - Abnormal; Notable for the following:    WBC 13.2 (*)    All other components within normal limits  URINALYSIS, ROUTINE W REFLEX MICROSCOPIC - Abnormal; Notable for the following:    APPearance HAZY (*)    Leukocytes, UA SMALL (*)    Bacteria, UA RARE (*)    Squamous Epithelial / LPF 6-30 (*)    All other components within normal limits  CBG MONITORING, ED - Abnormal; Notable for the following:    Glucose-Capillary 45 (*)    All other components within normal limits  CBG MONITORING, ED - Abnormal; Notable for the following:    Glucose-Capillary 111 (*)    All other components within normal limits  BRAIN NATRIURETIC PEPTIDE  DIGOXIN LEVEL  I-STAT TROPOININ, ED  CBG MONITORING, ED    EKG  EKG Interpretation  Date/Time:  Saturday April 23 2016 15:32:18 EST Ventricular Rate:  80 PR Interval:    QRS Duration: 134 QT Interval:  396 QTC Calculation: 456 R Axis:   15 Text Interpretation:  Sinus rhythm Premature ventricular complexes  Indeterminate axis Right bundle branch block Abnormal ECG Confirmed by Rubin Payor  MD, Harrold Donath [phone]) on 04/23/2016 5:05:02 PM       Radiology Dg Chest 2 View  Result Date: 04/23/2016 CLINICAL DATA:  Shortness of breath, cough. EXAM: CHEST  2 VIEW COMPARISON:  Radiographs of May 26, 2015. FINDINGS: The heart size and mediastinal contours are within normal limits. Both lungs are clear. No pneumothorax or pleural effusion is noted. Atherosclerosis of thoracic aorta is noted. The visualized skeletal structures are unremarkable. IMPRESSION: No active cardiopulmonary disease.  Aortic atherosclerosis. Electronically Signed   By: Lupita Raider, M.D.   On: 04/23/2016 16:41    Procedures Procedures (including critical care time)  Medications Ordered in ED Medications - No data to display   Initial Impression / Assessment and Plan / ED Course  I have reviewed the triage vital signs and the nursing notes.  Pertinent labs & imaging results that were available during my care of the patient were reviewed by me and considered in my medical decision making (see chart for details).  Clinical Course     Patient presented with generalized weakness and shortness of breath. Labs overall reassuring but did have hypoglycemia. Has reported not been hypoglycemic at home. Had been on antibiotics. Has x-ray that is reassuring. Labs reassuring. Near baseline. Feels better and is tolerating orals. Sugar is stable. Will discharge home follow-up with her PCP as needed.  Final Clinical Impressions(s) / ED Diagnoses   Final diagnoses:  Dyspnea, unspecified type  Hypoglycemia    New Prescriptions New Prescriptions   No medications on file     Benjiman Core, MD 04/23/16 2030

## 2016-04-23 NOTE — ED Triage Notes (Addendum)
Pt c/o shortness of breath started 3weeks-- and has gotten worse-also c/o falling 2 days ago--  Has been on 10 days on antibiotics for a septic bursitis in right elbow per primary care doc.  Lives with daughter -- daughter states that mother had high BP this am, with increasing confusion.

## 2016-04-23 NOTE — ED Notes (Signed)
Pt stable, understands discharge instructions, and reasons for return.   

## 2016-04-30 ENCOUNTER — Other Ambulatory Visit: Payer: Self-pay | Admitting: Cardiovascular Disease

## 2016-05-02 ENCOUNTER — Other Ambulatory Visit: Payer: Self-pay | Admitting: Cardiovascular Disease

## 2016-05-02 DIAGNOSIS — I10 Essential (primary) hypertension: Secondary | ICD-10-CM | POA: Diagnosis not present

## 2016-05-02 DIAGNOSIS — Z681 Body mass index (BMI) 19 or less, adult: Secondary | ICD-10-CM | POA: Diagnosis not present

## 2016-05-02 DIAGNOSIS — I482 Chronic atrial fibrillation: Secondary | ICD-10-CM | POA: Diagnosis not present

## 2016-05-02 DIAGNOSIS — R634 Abnormal weight loss: Secondary | ICD-10-CM | POA: Diagnosis not present

## 2016-05-02 DIAGNOSIS — Z7984 Long term (current) use of oral hypoglycemic drugs: Secondary | ICD-10-CM | POA: Diagnosis not present

## 2016-05-02 DIAGNOSIS — B353 Tinea pedis: Secondary | ICD-10-CM | POA: Diagnosis not present

## 2016-05-02 DIAGNOSIS — E119 Type 2 diabetes mellitus without complications: Secondary | ICD-10-CM | POA: Diagnosis not present

## 2016-05-19 ENCOUNTER — Encounter: Payer: Self-pay | Admitting: *Deleted

## 2016-05-23 ENCOUNTER — Other Ambulatory Visit: Payer: Self-pay | Admitting: Cardiovascular Disease

## 2016-05-23 NOTE — Progress Notes (Signed)
CARDIOLOGY OFFICE NOTE  Date:  05/24/2016    [name] Date of Birth: 1929-05-28 Medical Record #[id number]  PCP:  Gwynneth Aliment, MD  Cardiologist:  [name]    Chief Complaint  Patient presents with  . Medication Refill    1 year check - seen for Dr. [name]    History of Present Illness: [name] is a 83 y.o. female who presents today for a one year check. Seen for Dr. [name].   She has a history of diastolic CHF, HTN, DM2, COPD with ongoing tobacco abuse and atrial fibrillation. She was taken off of Coumadin due to age and low body mass. Her last 2D echo was in 2012, demonstrating normal EF at 60-65% and Grade I diastolic dysfunction. Her last 2D echo was in 2012, demonstrating normal EF at 60-65% and Grade I diastolic dysfunction.   Last seen a year ago - progressive memory issues noted.   In the ER over New Years with a multitude of symptoms.   Comes in today. Here with her daughter. She is very unsteady - this seems to be getting worse. Notes not feeling well over the past few weeks. No actual pain. Off and on diarrhea reported. No fever. Notes her "heart race has not been steady". No chest pain or shortness of breath noted. No recent falls but she has fallen. Does not use a cane/walker. She continues to lose weight - daughter notes that it is hard to get her to eat. Seems to have a generalized decline. Needs medicines refilled. Not on Norvasc anymore - not clear why. On higher dose of Coreg now.  BP running higher at home.    Past Medical History:  Diagnosis Date  . Benign hypertensive renal disease   . CAD (coronary artery disease)   . Cardiomyopathy    non-ischemic 04/2008 EF 45% cardiac MRI no scar  . CHF (congestive heart failure) (HCC)   . Chronic sinusitis   . COPD (chronic obstructive pulmonary disease) (HCC)   . Depression   . Dyspnea on exertion    chronic  . Hypercholesterolemia   . Hypertension   . Sinus headache    "chronic"  . Stroke  [facility]) 1997   "mild"  . Type II diabetes mellitus (HCC)     Past Surgical History:  Procedure Laterality Date  . carotid angiogram     2006  . ESOPHAGOGASTRODUODENOSCOPY     with foreign body removal  . NASAL SINUS SURGERY  ~1974;~ 1976  . TONSILLECTOMY     "when I was a chld"     Medications: Current Outpatient Prescriptions  Medication Sig Dispense Refill  . acetaminophen (TYLENOL) 500 MG tablet Take 1,000 mg by mouth every 6 (six) hours as needed for pain.    Marland Kitchen aspirin EC 81 MG tablet Take 81 mg by mouth daily.    Marland Kitchen atorvastatin (LIPITOR) 10 MG tablet Take 1 tablet (10 mg total) by mouth daily at 6 PM. 90 tablet 3  . benazepril (LOTENSIN) 40 MG tablet Take 1 tablet (40 mg total) by mouth daily. 90 tablet 3  . budesonide-formoterol (SYMBICORT) 160-4.5 MCG/ACT inhaler Inhale 2 puffs into the lungs 2 (two) times daily. 1 Inhaler 11  . carvedilol (COREG) 6.25 MG tablet Take 1 tablet (6.25 mg total) by mouth 2 (two) times daily with a meal. 180 tablet 3  . digoxin (DIGOX) 0.125 MG tablet Take 1 tablet (125 mcg total) by mouth daily. 90 tablet 3  .  furosemide (LASIX) 20 MG tablet Take 20 mg by mouth daily.      Marland Kitchen glimepiride (AMARYL) 2 MG tablet Take 2 mg by mouth daily before breakfast.      . Multiple Vitamin (MULTIVITAMIN) tablet Take 1 tablet by mouth daily.     Marland Kitchen amLODipine (NORVASC) 5 MG tablet Take 1 tablet (5 mg total) by mouth daily. 30 tablet 6  . loratadine (CLARITIN) 10 MG tablet Take 10 mg by mouth daily as needed for allergies.    . metFORMIN (GLUCOPHAGE-XR) 500 MG 24 hr tablet Take 500 mg by mouth daily.  5   No current facility-administered medications for this visit.     Allergies: No Known Allergies  Social History: The patient  reports that she has been smoking Cigarettes.  She has a 48.00 pack-year smoking history. She has never used smokeless tobacco. She reports that she does not drink alcohol or use drugs.   Family History: The patient's family history  includes Alcohol abuse in her brother and sister; Heart disease in her father.   Review of Systems: Please see the history of present illness.   Otherwise, the review of systems is positive for none.   All other systems are reviewed and negative.   Physical Exam: VS:  BP (!) 156/78   Pulse 75   Ht 5\' 3"  (1.6 m)   Wt 80 lb (36.3 kg)   BMI 14.17 kg/m  .  BMI Body mass index is 14.17 kg/m.  Wt Readings from Last 3 Encounters:  05/24/16 80 lb (36.3 kg)  04/23/16 84 lb (38.1 kg)  07/21/15 83 lb 12.8 oz (38 kg)    General: Pleasant. Very thin/emaciated. She is alert and in no acute distress.   HEENT: Normal.  Neck: Supple, no JVD, carotid bruits, or masses noted.  Cardiac: Regular rate and rhythm. Probable gallop. No edema.  Respiratory:  Lungs are clear to auscultation bilaterally with normal work of breathing.  GI: Soft and nontender.  MS: No deformity or atrophy. Gait unsteady.  Skin: Warm and dry. Color is normal.  Neuro:  Strength and sensation are intact and no gross focal deficits noted.  Psych: Alert, appropriate and with normal affect.   LABORATORY DATA:  EKG:  EKG is ordered today. This demonstrates NSR today with PVC - RBBB.  Lab Results  Component Value Date   WBC 13.2 (H) 04/23/2016   HGB 13.2 04/23/2016   HCT 40.3 04/23/2016   PLT 192 04/23/2016   GLUCOSE 64 (L) 04/23/2016   CHOL  05/01/2010    171        ATP III CLASSIFICATION:  <200     mg/dL   Desirable  161-096  mg/dL   Borderline High  >=045    mg/dL   High          TRIG 409 (H) 05/01/2010   HDL 53 05/01/2010   LDLCALC  05/01/2010    73        Total Cholesterol/HDL:CHD Risk Coronary Heart Disease Risk Table                     Men   Women  1/2 Average Risk   3.4   3.3  Average Risk       5.0   4.4  2 X Average Risk   9.6   7.1  3 X Average Risk  23.4   11.0        Use the calculated Patient Ratio above  and the CHD Risk Table to determine the patient's CHD Risk.        ATP III  CLASSIFICATION (LDL):  <100     mg/dL   Optimal  161-096  mg/dL   Near or Above                    Optimal  130-159  mg/dL   Borderline  045-409  mg/dL   High  >811     mg/dL   Very High   ALT 34 91/47/8295   AST 26 09/29/2014   NA 134 (L) 04/23/2016   K 4.5 04/23/2016   CL 95 (L) 04/23/2016   CREATININE 0.81 04/23/2016   BUN 17 04/23/2016   CO2 27 04/23/2016   TSH 0.849 04/30/2010   INR 1.01 11/08/2011   HGBA1C 7.1 (H) 11/08/2011    BNP (last 3 results)  Recent Labs  04/23/16 1716  BNP 72.4    ProBNP (last 3 results) No results for input(s): PROBNP in the last 8760 hours.   Other Studies Reviewed Today:   Assessment/Plan:  1. Chronic Diastolic HF: denies dyspnea. euvolemic on physical exam - she actually looks malnourished. Will need better BP control to prevent exacerbation. Adding back Norvasc today. Would favor a more conservative approach at this time.   2. HTN: improved with med compliance - daughter seems to be in control. Adding back Norvasc today. Daughter will monitor at home.   3. PAF:  She is in sinus.  Anticoagulation not recommended due to unsteadiness and falls.   4. Tobacco use: not discussed today - she does continue to smoke.   5. Probable dementia  6. HLD - on statin - labs today  7. Malnourished - lab today.    Current medicines are reviewed with the patient today.  The patient does not have concerns regarding medicines other than what has been noted above.  The following changes have been made:  See above.  Labs/ tests ordered today include:    Orders Placed This Encounter  Procedures  . Basic metabolic panel  . CBC  . Digoxin level  . Hepatic function panel  . Lipid panel  . EKG 12-Lead     Disposition:   FU with Dr. [name] in 4 months.   Patient is agreeable to this plan and will call if any problems develop in the interim.   SignedNorma Fredrickson, NP  05/24/2016 9:09 AM  [facility] [address] [place], [place]  [address] Phone: [phone] Fax: [phone]

## 2016-05-24 ENCOUNTER — Other Ambulatory Visit: Payer: Self-pay | Admitting: Nurse Practitioner

## 2016-05-24 ENCOUNTER — Encounter (INDEPENDENT_AMBULATORY_CARE_PROVIDER_SITE_OTHER): Payer: Self-pay

## 2016-05-24 ENCOUNTER — Encounter: Payer: Self-pay | Admitting: Nurse Practitioner

## 2016-05-24 ENCOUNTER — Ambulatory Visit (INDEPENDENT_AMBULATORY_CARE_PROVIDER_SITE_OTHER): Payer: Medicare Other | Admitting: Nurse Practitioner

## 2016-05-24 VITALS — BP 156/78 | HR 75 | Ht 63.0 in | Wt 80.0 lb

## 2016-05-24 DIAGNOSIS — I2589 Other forms of chronic ischemic heart disease: Secondary | ICD-10-CM

## 2016-05-24 DIAGNOSIS — I255 Ischemic cardiomyopathy: Secondary | ICD-10-CM

## 2016-05-24 DIAGNOSIS — I5032 Chronic diastolic (congestive) heart failure: Secondary | ICD-10-CM | POA: Diagnosis not present

## 2016-05-24 DIAGNOSIS — I10 Essential (primary) hypertension: Secondary | ICD-10-CM

## 2016-05-24 DIAGNOSIS — E7800 Pure hypercholesterolemia, unspecified: Secondary | ICD-10-CM

## 2016-05-24 MED ORDER — BENAZEPRIL HCL 40 MG PO TABS: 40.0000 mg | ORAL_TABLET | Freq: Every day | ORAL | 3 refills | Status: DC

## 2016-05-24 MED ORDER — CARVEDILOL 6.25 MG PO TABS: 6.2500 mg | ORAL_TABLET | Freq: Two times a day (BID) | ORAL | 3 refills | Status: DC

## 2016-05-24 MED ORDER — ATORVASTATIN CALCIUM 10 MG PO TABS: 10.0000 mg | ORAL_TABLET | Freq: Every day | ORAL | 3 refills | Status: DC

## 2016-05-24 MED ORDER — DIGOXIN 125 MCG PO TABS: 125.0000 ug | ORAL_TABLET | Freq: Every day | ORAL | 3 refills | Status: DC

## 2016-05-24 MED ORDER — AMLODIPINE BESYLATE 5 MG PO TABS: 5.0000 mg | ORAL_TABLET | Freq: Every day | ORAL | 6 refills | Status: DC

## 2016-05-24 NOTE — Patient Instructions (Addendum)
We will be checking the following labs today - BMET, CBC, HPF, Lipids and TSH   Medication Instructions:    Continue with your current medicines.   I sent in your refills today.   I am putting you back on Norvasc 5 mg to take once a day - this was sent to the drug store.     Testing/Procedures To Be Arranged:  N/A  Follow-Up:   See [place] back in about 4 months - try to see Dr. [name].     Other Special Instructions:   Would use a walker    If you need a refill on your cardiac medications before your next appointment, please call your pharmacy.   Call the [facility] office at [phone] if you have any questions, problems or concerns.

## 2016-05-25 LAB — HEPATIC FUNCTION PANEL
ALT: 13 IU/L (ref 0–32)
AST: 19 IU/L (ref 0–40)
Albumin: 4.5 g/dL (ref 3.5–4.7)
Alkaline Phosphatase: 49 IU/L (ref 39–117)
Bilirubin Total: 0.5 mg/dL (ref 0.0–1.2)
Bilirubin, Direct: 0.17 mg/dL (ref 0.00–0.40)
Total Protein: 7.1 g/dL (ref 6.0–8.5)

## 2016-05-25 LAB — BASIC METABOLIC PANEL
BUN/Creatinine Ratio: 17 (ref 12–28)
BUN: 13 mg/dL (ref 8–27)
CO2: 25 mmol/L (ref 18–29)
Calcium: 9.7 mg/dL (ref 8.7–10.3)
Chloride: 91 mmol/L — ABNORMAL LOW (ref 96–106)
Creatinine, Ser: 0.75 mg/dL (ref 0.57–1.00)
GFR calc Af Amer: 85 mL/min/{1.73_m2} (ref >59)
GFR calc non Af Amer: 73 mL/min/{1.73_m2} (ref >59)
Glucose: 219 mg/dL — ABNORMAL HIGH (ref 65–99)
Potassium: 4.6 mmol/L (ref 3.5–5.2)
Sodium: 138 mmol/L (ref 134–144)

## 2016-05-25 LAB — LIPID PANEL
Chol/HDL Ratio: 2 ratio units (ref 0.0–4.4)
Cholesterol, Total: 129 mg/dL (ref 100–199)
HDL: 65 mg/dL (ref >39)
LDL Calculated: 49 mg/dL (ref 0–99)
Triglycerides: 76 mg/dL (ref 0–149)
VLDL Cholesterol Cal: 15 mg/dL (ref 5–40)

## 2016-05-25 LAB — CBC
Hematocrit: 37.9 % (ref 34.0–46.6)
Hemoglobin: 12.6 g/dL (ref 11.1–15.9)
MCH: 31.6 pg (ref 26.6–33.0)
MCHC: 33.2 g/dL (ref 31.5–35.7)
MCV: 95 fL (ref 79–97)
Platelets: 241 10*3/uL (ref 150–379)
RBC: 3.99 x10E6/uL (ref 3.77–5.28)
RDW: 13.8 % (ref 12.3–15.4)
WBC: 10.5 10*3/uL (ref 3.4–10.8)

## 2016-05-25 LAB — DIGOXIN LEVEL: Digoxin, Serum: 1.5 ng/mL — ABNORMAL HIGH (ref 0.5–0.9)

## 2016-05-26 ENCOUNTER — Other Ambulatory Visit: Payer: Self-pay | Admitting: *Deleted

## 2016-05-26 MED ORDER — DIGOXIN 125 MCG PO TABS: 0.0625 mg | ORAL_TABLET | Freq: Every day | ORAL | 6 refills | Status: DC

## 2016-05-30 ENCOUNTER — Other Ambulatory Visit: Payer: Self-pay | Admitting: Cardiovascular Disease

## 2016-06-09 DIAGNOSIS — I10 Essential (primary) hypertension: Secondary | ICD-10-CM | POA: Diagnosis not present

## 2016-06-09 DIAGNOSIS — E7800 Pure hypercholesterolemia, unspecified: Secondary | ICD-10-CM | POA: Diagnosis not present

## 2016-06-09 DIAGNOSIS — J449 Chronic obstructive pulmonary disease, unspecified: Secondary | ICD-10-CM | POA: Diagnosis not present

## 2016-06-09 DIAGNOSIS — I482 Chronic atrial fibrillation: Secondary | ICD-10-CM | POA: Diagnosis not present

## 2016-06-09 DIAGNOSIS — E43 Unspecified severe protein-calorie malnutrition: Secondary | ICD-10-CM | POA: Diagnosis not present

## 2016-06-09 DIAGNOSIS — E119 Type 2 diabetes mellitus without complications: Secondary | ICD-10-CM | POA: Diagnosis not present

## 2016-06-09 DIAGNOSIS — Z0001 Encounter for general adult medical examination with abnormal findings: Secondary | ICD-10-CM | POA: Diagnosis not present

## 2016-06-09 DIAGNOSIS — Z79899 Other long term (current) drug therapy: Secondary | ICD-10-CM | POA: Diagnosis not present

## 2016-06-23 ENCOUNTER — Telehealth: Payer: Self-pay | Admitting: Pulmonary Disease

## 2016-06-23 MED ORDER — BUDESONIDE-FORMOTEROL FUMARATE 160-4.5 MCG/ACT IN AERO: 2.0000 | INHALATION_SPRAY | Freq: Two times a day (BID) | RESPIRATORY_TRACT | 0 refills | Status: DC

## 2016-06-23 NOTE — Telephone Encounter (Signed)
Spoke with daughter and she is aware that sample was placed up front for her to pick up. She had no further questions. Nothing further is needed at this time.

## 2016-07-11 ENCOUNTER — Ambulatory Visit (INDEPENDENT_AMBULATORY_CARE_PROVIDER_SITE_OTHER): Payer: Medicare Other | Admitting: Pulmonary Disease

## 2016-07-11 ENCOUNTER — Encounter: Payer: Self-pay | Admitting: Pulmonary Disease

## 2016-07-11 VITALS — BP 118/82 | HR 99 | Ht 62.0 in | Wt 83.4 lb

## 2016-07-11 DIAGNOSIS — R64 Cachexia: Secondary | ICD-10-CM | POA: Diagnosis not present

## 2016-07-11 DIAGNOSIS — B370 Candidal stomatitis: Secondary | ICD-10-CM

## 2016-07-11 DIAGNOSIS — J9611 Chronic respiratory failure with hypoxia: Secondary | ICD-10-CM

## 2016-07-11 DIAGNOSIS — I255 Ischemic cardiomyopathy: Secondary | ICD-10-CM | POA: Diagnosis not present

## 2016-07-11 DIAGNOSIS — J0100 Acute maxillary sinusitis, unspecified: Secondary | ICD-10-CM | POA: Diagnosis not present

## 2016-07-11 DIAGNOSIS — J432 Centrilobular emphysema: Secondary | ICD-10-CM | POA: Diagnosis not present

## 2016-07-11 MED ORDER — CEFUROXIME AXETIL 250 MG PO TABS
250.0000 mg | ORAL_TABLET | Freq: Two times a day (BID) | ORAL | 0 refills | Status: AC
Start: 2016-07-11 — End: 2016-07-21

## 2016-07-11 MED ORDER — PREDNISONE 5 MG PO TABS: ORAL_TABLET | ORAL | 0 refills | Status: DC

## 2016-07-11 MED ORDER — UMECLIDINIUM BROMIDE 62.5 MCG/INH IN AEPB
1.0000 | INHALATION_SPRAY | Freq: Every day | RESPIRATORY_TRACT | 0 refills | Status: AC
Start: 2016-07-11 — End: 2016-07-12

## 2016-07-11 MED ORDER — ALBUTEROL SULFATE HFA 108 (90 BASE) MCG/ACT IN AERS: 2.0000 | INHALATION_SPRAY | Freq: Four times a day (QID) | RESPIRATORY_TRACT | 5 refills | Status: DC | PRN

## 2016-07-11 MED ORDER — BUDESONIDE-FORMOTEROL FUMARATE 160-4.5 MCG/ACT IN AERO: 2.0000 | INHALATION_SPRAY | Freq: Two times a day (BID) | RESPIRATORY_TRACT | 0 refills | Status: DC

## 2016-07-11 MED ORDER — NYSTATIN 100000 UNIT/ML MT SUSP: 500000.0000 [IU] | Freq: Four times a day (QID) | OROMUCOSAL | 0 refills | Status: DC

## 2016-07-11 NOTE — Patient Instructions (Addendum)
Prednisone 5 mg pills >> 4 pills daily for 2 days, 3 pills daily for 2 days, 2 pills daily for days, 1 pill daily for 2 days  Ceftin 250 mg pill >> 1 pill twice daily for 10 days  Incruse 1 puff daily >> call once sample done if you want refill for this  Ventolin two puffs every 4 to 6 hours as needed for cough, wheeze, or chest congestion  Nystatin 5 ml swish and swallow four times per day for 5 days  Will arrange for 2 liters oxygen with exertion and sleep  Follow up with Dr. Kriste Basque or Nurse Practitioner in 1 month

## 2016-07-11 NOTE — Progress Notes (Signed)
Current Outpatient Prescriptions on File Prior to Visit  Medication Sig  . acetaminophen (TYLENOL) 500 MG tablet Take 1,000 mg by mouth every 6 (six) hours as needed for pain.  Marland Kitchen amLODipine (NORVASC) 5 MG tablet Take 1 tablet (5 mg total) by mouth daily.  Marland Kitchen aspirin EC 81 MG tablet Take 81 mg by mouth daily.  Marland Kitchen atorvastatin (LIPITOR) 10 MG tablet Take 1 tablet (10 mg total) by mouth daily at 6 PM.  . benazepril (LOTENSIN) 40 MG tablet Take 1 tablet (40 mg total) by mouth daily.  . budesonide-formoterol (SYMBICORT) 160-4.5 MCG/ACT inhaler Inhale 2 puffs into the lungs 2 (two) times daily.  . carvedilol (COREG) 6.25 MG tablet Take 1 tablet (6.25 mg total) by mouth 2 (two) times daily with a meal.  . digoxin (DIGOX) 0.125 MG tablet Take 0.5 tablets (0.0625 mg total) by mouth daily.  . furosemide (LASIX) 20 MG tablet Take 20 mg by mouth daily.    Marland Kitchen glimepiride (AMARYL) 2 MG tablet Take 2 mg by mouth daily before breakfast.    . metFORMIN (GLUCOPHAGE-XR) 500 MG 24 hr tablet Take 500 mg by mouth daily.  . Multiple Vitamin (MULTIVITAMIN) tablet Take 1 tablet by mouth daily.   Marland Kitchen loratadine (CLARITIN) 10 MG tablet Take 10 mg by mouth daily as needed for allergies.   No current facility-administered medications on file prior to visit.      Chief Complaint  Patient presents with  . Acute Visit    Pt c/o coughing, SOB and wheezing x 4 days - worsening. Pt denies mucus production with cough or fever.     Past medical history, Past surgical history, Family history, Social history, Allergies all reviewed.  Vital Signs BP 118/82 (BP Location: Right Arm, Cuff Size: Normal)   Pulse 99   Ht 5\' 2"  (1.575 m)   Wt 83 lb 6.4 oz (37.8 kg)   SpO2 92%   BMI 15.25 kg/m   History of Present Illness [name] is a 83 y.o. female smoker with COPD.  She is followed by Dr. Kriste Basque.  She developed sinus congestion, and post nasal drip.  She gets sore over her cheeks.  She has cough, but has trouble  bringing up sputum.  She hasn't checked her temperature.  She is getting more wheeze, and has been using her daughters albuterol.  She uses symbicort bid.  She has been getting swelling in her legs.  She has noticed more fatigue.  Spirometry 05/27/15 >> FEV1 0.66 (44%), FEV1% 48  Ambulatory oximetry today >> 87% after 1 lap on room air >> improved to 92% with 3 liters.  Physical Exam  General - cachectis Eyes - pupils reactive ENT - maxillary sinuses tender, yellow drainage, TM clear, white exudate on posterior pharynx and hard palate, no LAN Cardiac - s1s2 regular, no murmur Chest - faint b/l wheeze Back - No focal tenderness Abd - Soft, non-tender Ext - 1+ ankle edema Neuro - Normal strength Skin - No rashes Psych - normal mood, and behavior   Assessment/Plan  Acute sinusitis with AECOPD. - will give course of prednisone and ceftin  Severe COPD with emphysema. - will have her try incruse again >> sample given, and she will call if she feels this helps and get refill - continue symbicort - will get her prn albuterol inhaler  Chronic respiratory failure with hypoxia. - will start on 3 liters oxygen with exertion and sleep  Thrush. - will give course of nystatin  Cachexia. -  f/u with PCP   Patient Instructions  Prednisone 5 mg pills >> 4 pills daily for 2 days, 3 pills daily for 2 days, 2 pills daily for days, 1 pill daily for 2 days  Ceftin 250 mg pill >> 1 pill twice daily for 10 days  Incruse 1 puff daily >> call once sample done if you want refill for this  Ventolin two puffs every 4 to 6 hours as needed for cough, wheeze, or chest congestion  Nystatin 5 ml swish and swallow four times per day for 5 days  Follow up with Dr. Kriste Basque or Nurse Practitioner in 1 month    Coralyn Helling, MD [facility] Pager:  [phone] 07/11/2016, 2:33 PM

## 2016-07-12 ENCOUNTER — Telehealth: Payer: Self-pay | Admitting: Pulmonary Disease

## 2016-07-12 NOTE — Telephone Encounter (Signed)
This has been taken care of already. Minna Antis with [employer] came by the office and had Dr Craige Cotta to sign this while she was here. Nothing further needed.

## 2016-07-12 NOTE — Telephone Encounter (Signed)
Form given to [name] to fill out and have VS sign.  Will route to AG for follow up.

## 2016-07-25 ENCOUNTER — Other Ambulatory Visit: Payer: Self-pay | Admitting: Pulmonary Disease

## 2016-07-26 ENCOUNTER — Encounter (HOSPITAL_COMMUNITY): Payer: Self-pay

## 2016-07-26 ENCOUNTER — Inpatient Hospital Stay (HOSPITAL_COMMUNITY)
Admission: EM | Admit: 2016-07-26 | Discharge: 2016-08-02 | DRG: 190 | Disposition: A | Discharge status: Skilled Nursing Facility | Payer: Medicare Other | Attending: Internal Medicine | Admitting: Internal Medicine

## 2016-07-26 ENCOUNTER — Emergency Department (HOSPITAL_COMMUNITY): Payer: Medicare Other

## 2016-07-26 DIAGNOSIS — I10 Essential (primary) hypertension: Secondary | ICD-10-CM | POA: Diagnosis not present

## 2016-07-26 DIAGNOSIS — J209 Acute bronchitis, unspecified: Secondary | ICD-10-CM | POA: Diagnosis present

## 2016-07-26 DIAGNOSIS — E785 Hyperlipidemia, unspecified: Secondary | ICD-10-CM | POA: Diagnosis present

## 2016-07-26 DIAGNOSIS — I5032 Chronic diastolic (congestive) heart failure: Secondary | ICD-10-CM | POA: Diagnosis present

## 2016-07-26 DIAGNOSIS — F17210 Nicotine dependence, cigarettes, uncomplicated: Secondary | ICD-10-CM | POA: Diagnosis present

## 2016-07-26 DIAGNOSIS — R0902 Hypoxemia: Secondary | ICD-10-CM

## 2016-07-26 DIAGNOSIS — I5033 Acute on chronic diastolic (congestive) heart failure: Secondary | ICD-10-CM | POA: Diagnosis not present

## 2016-07-26 DIAGNOSIS — J9611 Chronic respiratory failure with hypoxia: Secondary | ICD-10-CM | POA: Diagnosis present

## 2016-07-26 DIAGNOSIS — R488 Other symbolic dysfunctions: Secondary | ICD-10-CM | POA: Diagnosis not present

## 2016-07-26 DIAGNOSIS — J449 Chronic obstructive pulmonary disease, unspecified: Secondary | ICD-10-CM | POA: Diagnosis not present

## 2016-07-26 DIAGNOSIS — Z7951 Long term (current) use of inhaled steroids: Secondary | ICD-10-CM

## 2016-07-26 DIAGNOSIS — E1159 Type 2 diabetes mellitus with other circulatory complications: Secondary | ICD-10-CM | POA: Diagnosis present

## 2016-07-26 DIAGNOSIS — Z9981 Dependence on supplemental oxygen: Secondary | ICD-10-CM | POA: Diagnosis not present

## 2016-07-26 DIAGNOSIS — J189 Pneumonia, unspecified organism: Secondary | ICD-10-CM

## 2016-07-26 DIAGNOSIS — E119 Type 2 diabetes mellitus without complications: Secondary | ICD-10-CM | POA: Diagnosis present

## 2016-07-26 DIAGNOSIS — E1122 Type 2 diabetes mellitus with diabetic chronic kidney disease: Secondary | ICD-10-CM | POA: Diagnosis present

## 2016-07-26 DIAGNOSIS — L0231 Cutaneous abscess of buttock: Secondary | ICD-10-CM

## 2016-07-26 DIAGNOSIS — I428 Other cardiomyopathies: Secondary | ICD-10-CM | POA: Diagnosis present

## 2016-07-26 DIAGNOSIS — R0600 Dyspnea, unspecified: Secondary | ICD-10-CM | POA: Diagnosis not present

## 2016-07-26 DIAGNOSIS — Z681 Body mass index (BMI) 19 or less, adult: Secondary | ICD-10-CM | POA: Diagnosis not present

## 2016-07-26 DIAGNOSIS — I2510 Atherosclerotic heart disease of native coronary artery without angina pectoris: Secondary | ICD-10-CM | POA: Diagnosis present

## 2016-07-26 DIAGNOSIS — I4891 Unspecified atrial fibrillation: Secondary | ICD-10-CM | POA: Diagnosis present

## 2016-07-26 DIAGNOSIS — J441 Chronic obstructive pulmonary disease with (acute) exacerbation: Principal | ICD-10-CM

## 2016-07-26 DIAGNOSIS — J9621 Acute and chronic respiratory failure with hypoxia: Secondary | ICD-10-CM | POA: Diagnosis not present

## 2016-07-26 DIAGNOSIS — Z79899 Other long term (current) drug therapy: Secondary | ICD-10-CM | POA: Diagnosis not present

## 2016-07-26 DIAGNOSIS — M6281 Muscle weakness (generalized): Secondary | ICD-10-CM | POA: Diagnosis not present

## 2016-07-26 DIAGNOSIS — E43 Unspecified severe protein-calorie malnutrition: Secondary | ICD-10-CM | POA: Diagnosis present

## 2016-07-26 DIAGNOSIS — R1312 Dysphagia, oropharyngeal phase: Secondary | ICD-10-CM | POA: Diagnosis not present

## 2016-07-26 DIAGNOSIS — Z7984 Long term (current) use of oral hypoglycemic drugs: Secondary | ICD-10-CM

## 2016-07-26 DIAGNOSIS — R2689 Other abnormalities of gait and mobility: Secondary | ICD-10-CM | POA: Diagnosis not present

## 2016-07-26 DIAGNOSIS — Z7982 Long term (current) use of aspirin: Secondary | ICD-10-CM | POA: Diagnosis not present

## 2016-07-26 DIAGNOSIS — L8990 Pressure ulcer of unspecified site, unspecified stage: Secondary | ICD-10-CM | POA: Insufficient documentation

## 2016-07-26 DIAGNOSIS — I130 Hypertensive heart and chronic kidney disease with heart failure and stage 1 through stage 4 chronic kidney disease, or unspecified chronic kidney disease: Secondary | ICD-10-CM | POA: Diagnosis present

## 2016-07-26 DIAGNOSIS — E139 Other specified diabetes mellitus without complications: Secondary | ICD-10-CM | POA: Diagnosis not present

## 2016-07-26 DIAGNOSIS — I110 Hypertensive heart disease with heart failure: Secondary | ICD-10-CM | POA: Diagnosis present

## 2016-07-26 DIAGNOSIS — R636 Underweight: Secondary | ICD-10-CM

## 2016-07-26 DIAGNOSIS — J80 Acute respiratory distress syndrome: Secondary | ICD-10-CM | POA: Diagnosis not present

## 2016-07-26 DIAGNOSIS — I491 Atrial premature depolarization: Secondary | ICD-10-CM | POA: Diagnosis present

## 2016-07-26 DIAGNOSIS — E1169 Type 2 diabetes mellitus with other specified complication: Secondary | ICD-10-CM | POA: Diagnosis not present

## 2016-07-26 DIAGNOSIS — J440 Chronic obstructive pulmonary disease with acute lower respiratory infection: Secondary | ICD-10-CM | POA: Diagnosis present

## 2016-07-26 DIAGNOSIS — R278 Other lack of coordination: Secondary | ICD-10-CM | POA: Diagnosis not present

## 2016-07-26 DIAGNOSIS — R0602 Shortness of breath: Secondary | ICD-10-CM | POA: Diagnosis present

## 2016-07-26 HISTORY — DX: Dependence on supplemental oxygen: Z99.81

## 2016-07-26 LAB — CBC WITH DIFFERENTIAL/PLATELET
BASOS PCT: 0 %
Basophils Absolute: 0 10*3/uL (ref 0.0–0.1)
Eosinophils Absolute: 0 10*3/uL (ref 0.0–0.7)
Eosinophils Relative: 0 %
HEMATOCRIT: 37 % (ref 36.0–46.0)
Hemoglobin: 11.9 g/dL — ABNORMAL LOW (ref 12.0–15.0)
Lymphocytes Relative: 10 %
Lymphs Abs: 1.4 10*3/uL (ref 0.7–4.0)
MCH: 30.5 pg (ref 26.0–34.0)
MCHC: 32.2 g/dL (ref 30.0–36.0)
MCV: 94.9 fL (ref 78.0–100.0)
MONO ABS: 1.7 10*3/uL — AB (ref 0.1–1.0)
Monocytes Relative: 11 %
NEUTROS ABS: 11.9 10*3/uL — AB (ref 1.7–7.7)
Neutrophils Relative %: 79 %
Platelets: 228 10*3/uL (ref 150–400)
RBC: 3.9 MIL/uL (ref 3.87–5.11)
RDW: 13.4 % (ref 11.5–15.5)
WBC: 15.1 10*3/uL — ABNORMAL HIGH (ref 4.0–10.5)

## 2016-07-26 LAB — BASIC METABOLIC PANEL
Anion gap: 11 (ref 5–15)
BUN: 16 mg/dL (ref 6–20)
CALCIUM: 9.6 mg/dL (ref 8.9–10.3)
CO2: 31 mmol/L (ref 22–32)
CREATININE: 0.81 mg/dL (ref 0.44–1.00)
Chloride: 89 mmol/L — ABNORMAL LOW (ref 101–111)
GFR calc non Af Amer: >60 mL/min (ref >60)
GLUCOSE: 143 mg/dL — AB (ref 65–99)
Potassium: 4.9 mmol/L (ref 3.5–5.1)
Sodium: 131 mmol/L — ABNORMAL LOW (ref 135–145)

## 2016-07-26 LAB — GLUCOSE, CAPILLARY
GLUCOSE-CAPILLARY: 444 mg/dL — AB (ref 65–99)
GLUCOSE-CAPILLARY: 403 mg/dL — AB (ref 65–99)

## 2016-07-26 LAB — BRAIN NATRIURETIC PEPTIDE: B Natriuretic Peptide: 163.1 pg/mL — ABNORMAL HIGH (ref 0.0–100.0)

## 2016-07-26 LAB — I-STAT TROPONIN, ED: Troponin i, poc: 0.02 ng/mL (ref 0.00–0.08)

## 2016-07-26 MED ORDER — INSULIN ASPART 100 UNIT/ML ~~LOC~~ SOLN
0.0000 [IU] | Freq: Three times a day (TID) | SUBCUTANEOUS | Status: DC
Start: 2016-07-27 — End: 2016-07-27
  Administered 2016-07-27: 3 [IU] via SUBCUTANEOUS
  Administered 2016-07-27: 1 [IU] via SUBCUTANEOUS
  Administered 2016-07-27: 3 [IU] via SUBCUTANEOUS

## 2016-07-26 MED ORDER — IPRATROPIUM-ALBUTEROL 0.5-2.5 (3) MG/3ML IN SOLN
3.0000 mL | Freq: Once | RESPIRATORY_TRACT | Status: AC
  Administered 2016-07-26: 3 mL via RESPIRATORY_TRACT
  Filled 2016-07-26: qty 3

## 2016-07-26 MED ORDER — ACETAMINOPHEN 500 MG PO TABS: 1000.0000 mg | ORAL_TABLET | Freq: Four times a day (QID) | ORAL | Status: DC | PRN

## 2016-07-26 MED ORDER — ONDANSETRON HCL 4 MG/2ML IJ SOLN: 4.0000 mg | Freq: Four times a day (QID) | INTRAMUSCULAR | Status: DC | PRN

## 2016-07-26 MED ORDER — METHYLPREDNISOLONE SODIUM SUCC 125 MG IJ SOLR
125.0000 mg | Freq: Once | INTRAMUSCULAR | Status: AC
  Administered 2016-07-26: 125 mg via INTRAVENOUS
  Filled 2016-07-26: qty 2

## 2016-07-26 MED ORDER — GLIMEPIRIDE 4 MG PO TABS
2.0000 mg | ORAL_TABLET | Freq: Every day | ORAL | Status: DC
  Administered 2016-07-27: 2 mg via ORAL
  Filled 2016-07-26: qty 1

## 2016-07-26 MED ORDER — DOXYCYCLINE HYCLATE 100 MG IV SOLR
100.0000 mg | Freq: Once | INTRAVENOUS | Status: AC
  Administered 2016-07-26: 100 mg via INTRAVENOUS
  Filled 2016-07-26: qty 100

## 2016-07-26 MED ORDER — CARVEDILOL 6.25 MG PO TABS
6.2500 mg | ORAL_TABLET | Freq: Two times a day (BID) | ORAL | Status: DC
  Administered 2016-07-26 – 2016-08-02 (×14): 6.25 mg via ORAL
  Filled 2016-07-26 (×14): qty 1

## 2016-07-26 MED ORDER — INSULIN ASPART 100 UNIT/ML ~~LOC~~ SOLN
10.0000 [IU] | Freq: Once | SUBCUTANEOUS | Status: AC
  Administered 2016-07-26: 10 [IU] via SUBCUTANEOUS

## 2016-07-26 MED ORDER — AMLODIPINE BESYLATE 5 MG PO TABS
5.0000 mg | ORAL_TABLET | Freq: Every day | ORAL | Status: DC
  Administered 2016-07-27 – 2016-08-02 (×7): 5 mg via ORAL
  Filled 2016-07-26 (×7): qty 1

## 2016-07-26 MED ORDER — PREDNISONE 20 MG PO TABS: 40.0000 mg | ORAL_TABLET | Freq: Every day | ORAL | 0 refills | Status: DC

## 2016-07-26 MED ORDER — DEXTROSE 5 % IV SOLN
500.0000 mg | INTRAVENOUS | Status: DC
  Administered 2016-07-26: 500 mg via INTRAVENOUS
  Filled 2016-07-26 (×2): qty 500

## 2016-07-26 MED ORDER — BENAZEPRIL HCL 40 MG PO TABS
40.0000 mg | ORAL_TABLET | Freq: Every day | ORAL | Status: DC
  Filled 2016-07-26: qty 1

## 2016-07-26 MED ORDER — ENOXAPARIN SODIUM 30 MG/0.3ML ~~LOC~~ SOLN
30.0000 mg | SUBCUTANEOUS | Status: DC
  Administered 2016-07-26: 30 mg via SUBCUTANEOUS
  Filled 2016-07-26: qty 0.3

## 2016-07-26 MED ORDER — ONDANSETRON HCL 4 MG PO TABS: 4.0000 mg | ORAL_TABLET | Freq: Four times a day (QID) | ORAL | Status: DC | PRN

## 2016-07-26 MED ORDER — INSULIN ASPART 100 UNIT/ML ~~LOC~~ SOLN
0.0000 [IU] | Freq: Every day | SUBCUTANEOUS | Status: DC
  Administered 2016-07-26: 5 [IU] via SUBCUTANEOUS

## 2016-07-26 MED ORDER — DIGOXIN 125 MCG PO TABS
0.0625 mg | ORAL_TABLET | Freq: Every day | ORAL | Status: DC
  Administered 2016-07-27 – 2016-08-02 (×7): 0.0625 mg via ORAL
  Filled 2016-07-26 (×7): qty 1

## 2016-07-26 MED ORDER — ENSURE ENLIVE PO LIQD: 237.0000 mL | Freq: Two times a day (BID) | ORAL | Status: DC

## 2016-07-26 MED ORDER — DOXYCYCLINE HYCLATE 100 MG PO CAPS: 100.0000 mg | ORAL_CAPSULE | Freq: Two times a day (BID) | ORAL | 0 refills | Status: DC

## 2016-07-26 MED ORDER — ATORVASTATIN CALCIUM 10 MG PO TABS
10.0000 mg | ORAL_TABLET | Freq: Every day | ORAL | Status: DC
  Administered 2016-07-26 – 2016-08-01 (×7): 10 mg via ORAL
  Filled 2016-07-26 (×7): qty 1

## 2016-07-26 MED ORDER — FLUTICASONE PROPIONATE 50 MCG/ACT NA SUSP
1.0000 | Freq: Every day | NASAL | Status: DC | PRN
  Filled 2016-07-26: qty 16

## 2016-07-26 MED ORDER — ASPIRIN EC 81 MG PO TBEC
81.0000 mg | DELAYED_RELEASE_TABLET | Freq: Every day | ORAL | Status: DC
  Administered 2016-07-27 – 2016-08-02 (×7): 81 mg via ORAL
  Filled 2016-07-26 (×7): qty 1

## 2016-07-26 MED ORDER — FUROSEMIDE 40 MG PO TABS
20.0000 mg | ORAL_TABLET | Freq: Every day | ORAL | Status: DC
  Administered 2016-07-27: 20 mg via ORAL
  Filled 2016-07-26: qty 1

## 2016-07-26 MED ORDER — METFORMIN HCL ER 500 MG PO TB24
1000.0000 mg | ORAL_TABLET | Freq: Two times a day (BID) | ORAL | Status: DC
  Administered 2016-07-26 – 2016-07-27 (×2): 1000 mg via ORAL
  Filled 2016-07-26 (×2): qty 2

## 2016-07-26 MED ORDER — METFORMIN HCL ER 500 MG PO TB24: 1000.0000 mg | ORAL_TABLET | Freq: Two times a day (BID) | ORAL | Status: DC

## 2016-07-26 MED ORDER — IPRATROPIUM-ALBUTEROL 0.5-2.5 (3) MG/3ML IN SOLN
3.0000 mL | RESPIRATORY_TRACT | Status: DC | PRN
  Administered 2016-07-27: 3 mL via RESPIRATORY_TRACT
  Filled 2016-07-26 (×2): qty 3

## 2016-07-26 MED ORDER — IPRATROPIUM-ALBUTEROL 0.5-2.5 (3) MG/3ML IN SOLN
3.0000 mL | RESPIRATORY_TRACT | Status: DC
  Administered 2016-07-26: 3 mL via RESPIRATORY_TRACT
  Filled 2016-07-26: qty 3

## 2016-07-26 MED ORDER — GLUCERNA SHAKE PO LIQD: 237.0000 mL | Freq: Two times a day (BID) | ORAL | Status: DC | PRN

## 2016-07-26 MED ORDER — METHYLPREDNISOLONE SODIUM SUCC 125 MG IJ SOLR
60.0000 mg | Freq: Four times a day (QID) | INTRAMUSCULAR | Status: DC
  Administered 2016-07-26 – 2016-07-27 (×3): 60 mg via INTRAVENOUS
  Filled 2016-07-26 (×3): qty 2

## 2016-07-26 MED ORDER — SODIUM CHLORIDE 0.9 % IV SOLN
INTRAVENOUS | Status: DC
  Administered 2016-07-26: 20:00:00 via INTRAVENOUS

## 2016-07-26 NOTE — ED Notes (Signed)
Pt ambulated with pulse ox and O2 at 84% on 3L [place] and heart rate of 160. Dr. Silverio Lay informed.

## 2016-07-26 NOTE — ED Triage Notes (Signed)
Patient here with increased fatigue, sleeping and coughing for 1 week. Patient alert and denies pain. Has been on continuous oxygen at 3l for 2 weeks. Also pedal edema per daughter and periods of altered loc. Has finished antibiotic recently for cough. Also reports boil/abscess to buttock

## 2016-07-26 NOTE — Progress Notes (Signed)
Patient CBG 444. MD notified.

## 2016-07-26 NOTE — ED Provider Notes (Signed)
[facility]-EMERGENCY DEPT Provider Note   CSN: [id number] Arrival date & time: 07/26/16  1201     History   Chief Complaint No chief complaint on file.   HPI [name] is a 83 y.o. female who presents with SOB. PMH significant for CAD, non-ischemic cardiomyopathy EF 45%, COPD, current smoker. She lives at home with her daughter. Over the past several weeks she has had more SOB. She visited her pulmonologist who prescribed her Ceftin for a sinus infection, prednisone taper, and home O2 which she has been using continuously at 3L. Over the past week she is more SOB with activity and her daughter notes mild confusion (confusing the remote and phone) and fatigue. She also has a productive cough. No fever, chills, chest pain, abdominal pain, N/V. She has chronic diarrhea. Also had significant leg swelling last night which has gone down today. Daughter also notes a "boil" on her right buttock which she has been cleaning and putting neosporin on.  HPI  Past Medical History:  Diagnosis Date  . Benign hypertensive renal disease   . CAD (coronary artery disease)   . Cardiomyopathy    non-ischemic 04/2008 EF 45% cardiac MRI no scar  . CHF (congestive heart failure) (HCC)   . Chronic sinusitis   . COPD (chronic obstructive pulmonary disease) (HCC)   . Depression   . Dyspnea on exertion    chronic  . Hypercholesterolemia   . Hypertension   . Sinus headache    "chronic"  . Stroke [facility]) 1997   "mild"  . Type II diabetes mellitus [facility])     Patient Active Problem List   Diagnosis Date Noted  . Cigarette smoker 07/21/2015  . COPD with chronic bronchitis and emphysema (HCC) 05/27/2015  . Underweight 05/27/2015  . Chronic diastolic heart failure (HCC) 11/24/2011  . HTN (hypertension), malignant 11/08/2011  . Viral illness 11/08/2011  . DYSURIA 05/13/2010  . PAROXYSMAL ATRIAL FIBRILLATION 11/18/2009  . PALPITATIONS, OCCASIONAL 10/14/2008  . Type 2 diabetes mellitus with hyperglycemia (HCC)  09/12/2008  . HYPERCHOLESTEROLEMIA 09/12/2008  . CARDIOMYOPATHY 09/12/2008  . CAROTID ARTERY DISEASE 09/12/2008  . DYSPNEA ON EXERTION 09/12/2008  . RESTLESS LEG SYNDROME, HX OF 09/12/2008  . CONGESTIVE HEART FAILURE, HX OF 09/12/2008    Past Surgical History:  Procedure Laterality Date  . carotid angiogram     2006  . ESOPHAGOGASTRODUODENOSCOPY     with foreign body removal  . NASAL SINUS SURGERY  ~1974;~ 1976  . TONSILLECTOMY     "when I was a chld"    OB History    No data available       Home Medications    Prior to Admission medications   Medication Sig Start Date End Date Taking? Authorizing Provider  acetaminophen (TYLENOL) 500 MG tablet Take 1,000 mg by mouth every 6 (six) hours as needed for pain.   Yes Historical Provider, MD  albuterol (VENTOLIN HFA) 108 (90 Base) MCG/ACT inhaler Inhale 2 puffs into the lungs every 6 (six) hours as needed for wheezing or shortness of breath. 07/11/16  Yes Coralyn Helling, MD  amLODipine (NORVASC) 5 MG tablet Take 1 tablet (5 mg total) by mouth daily. 05/24/16  Yes Rosalio Macadamia, NP  aspirin EC 81 MG tablet Take 81 mg by mouth daily.   Yes Historical Provider, MD  atorvastatin (LIPITOR) 10 MG tablet Take 1 tablet (10 mg total) by mouth daily at 6 PM. 05/24/16  Yes Rosalio Macadamia, NP  benazepril (LOTENSIN) 40 MG tablet  Take 1 tablet (40 mg total) by mouth daily. 05/24/16  Yes Rosalio Macadamia, NP  budesonide-formoterol (SYMBICORT) 160-4.5 MCG/ACT inhaler Inhale 2 puffs into the lungs 2 (two) times daily. 06/23/16  Yes Michele Mcalpine, MD  carvedilol (COREG) 6.25 MG tablet Take 1 tablet (6.25 mg total) by mouth 2 (two) times daily with a meal. 05/24/16  Yes Rosalio Macadamia, NP  digoxin (DIGOX) 0.125 MG tablet Take 0.5 tablets (0.0625 mg total) by mouth daily. 05/26/16  Yes Rosalio Macadamia, NP  fluticasone (FLONASE) 50 MCG/ACT nasal spray Place 1 spray into both nostrils daily as needed for allergies. 06/09/16  Yes Historical Provider, MD  furosemide  (LASIX) 20 MG tablet Take 20 mg by mouth daily.     Yes Historical Provider, MD  glimepiride (AMARYL) 2 MG tablet Take 2 mg by mouth daily before breakfast.     Yes Historical Provider, MD  metFORMIN (GLUCOPHAGE-XR) 500 MG 24 hr tablet Take 1,000 mg by mouth 2 (two) times daily.  03/13/15  Yes Historical Provider, MD  budesonide-formoterol (SYMBICORT) 160-4.5 MCG/ACT inhaler Inhale 2 puffs into the lungs 2 (two) times daily. 07/11/16 07/12/16  Coralyn Helling, MD  nystatin (MYCOSTATIN) 100000 UNIT/ML suspension Take 5 mLs (500,000 Units total) by mouth 4 (four) times daily. Patient not taking: Reported on 07/26/2016 07/11/16   Coralyn Helling, MD  predniSONE (DELTASONE) 5 MG tablet 4 pills daily for 2 days, 3 pills daily for 2 days, 2 pills daily for 2 days, 1 pil daily for 2 days Patient not taking: Reported on 07/26/2016 07/11/16   Coralyn Helling, MD    Family History Family History  Problem Relation Age of Onset  . Heart disease Father   . Alcohol abuse Sister   . Alcohol abuse Brother   . Coronary artery disease      family history    Social History Social History  Substance Use Topics  . Smoking status: Current Every Day Smoker    Packs/day: 0.75    Years: 64.00    Types: Cigarettes  . Smokeless tobacco: Never Used     Comment: 1-4 cigs per day   . Alcohol use No     Allergies   Patient has no known allergies.   Review of Systems Review of Systems  Constitutional: Positive for fatigue. Negative for chills and fever.  Respiratory: Positive for cough and shortness of breath.   Cardiovascular: Positive for leg swelling. Negative for chest pain.  Gastrointestinal: Positive for diarrhea. Negative for abdominal pain, nausea and vomiting.  Skin:       Abscess     Physical Exam Updated Vital Signs BP 114/76   Pulse 70   Temp 98.4 F (36.9 C) (Oral)   Resp (!) 24   SpO2 (!) 89%   Physical Exam  Constitutional: She is oriented to person, place, and time. She appears cachectic. She  is cooperative. No distress. Nasal cannula in place.  HENT:  Head: Normocephalic and atraumatic.  Eyes: Conjunctivae are normal. Pupils are equal, round, and reactive to light. Right eye exhibits no discharge. Left eye exhibits no discharge. No scleral icterus.  Neck: Normal range of motion.  Cardiovascular: Regular rhythm.  Tachycardia present.  Exam reveals no gallop and no friction rub.   No murmur heard. Pulmonary/Chest: Accessory muscle usage present. Tachypnea noted. No respiratory distress. She has wheezes (mild diffuse wheezes). She has no rales. She exhibits no tenderness.  Abdominal: Soft. Bowel sounds are normal. She exhibits no distension and no  mass. There is no tenderness. There is no rebound and no guarding. No hernia.  Neurological: She is alert and oriented to person, place, and time.  Skin: Skin is warm and dry.  Abscess which appears to have already drained on R buttocks  Psychiatric: She has a normal mood and affect. Her behavior is normal.  Nursing note and vitals reviewed.    ED Treatments / Results  Labs (all labs ordered are listed, but only abnormal results are displayed) Labs Reviewed  CBC WITH DIFFERENTIAL/PLATELET - Abnormal; Notable for the following:       Result Value   WBC 15.1 (*)    Hemoglobin 11.9 (*)    Neutro Abs 11.9 (*)    Monocytes Absolute 1.7 (*)    All other components within normal limits  BASIC METABOLIC PANEL - Abnormal; Notable for the following:    Sodium 131 (*)    Chloride 89 (*)    Glucose, Bld 143 (*)    All other components within normal limits  BRAIN NATRIURETIC PEPTIDE - Abnormal; Notable for the following:    B Natriuretic Peptide 163.1 (*)    All other components within normal limits  I-STAT TROPOININ, ED    EKG  EKG Interpretation None       Radiology Dg Chest 2 View  Result Date: 07/26/2016 CLINICAL DATA:  Shortness of breath. EXAM: CHEST  2 VIEW COMPARISON:  04/23/2016.  05/26/2015. FINDINGS: Mediastinum and  hilar structures are normal. Cardiomegaly with normal pulmonary vascularity. Mild lingular subsegmental atelectasis, best identified on lateral view. This is new from prior exams. Stable mild basal pleural thickening noted consistent scarring. No pneumothorax. Heart size stable. Degenerative changes and scoliosis thoracic spine . IMPRESSION: Mild lingular atelectasis, best identified on lateral view. No other focal abnormality identified. Exam otherwise stable from prior exams. Electronically Signed   By: Maisie Fus  Register   On: 07/26/2016 12:39    Procedures Procedures (including critical care time)  CRITICAL CARE Performed by: Bethel Born   Total critical care time: 30 minutes  Critical care time was exclusive of separately billable procedures and treating other patients.  Critical care was necessary to treat or prevent imminent or life-threatening deterioration.  Critical care was time spent personally by me on the following activities: development of treatment plan with patient and/or surrogate as well as nursing, discussions with consultants, evaluation of patient's response to treatment, examination of patient, obtaining history from patient or surrogate, ordering and performing treatments and interventions, ordering and review of laboratory studies, ordering and review of radiographic studies, pulse oximetry and re-evaluation of patient's condition.   Medications Ordered in ED Medications  doxycycline (VIBRAMYCIN) 100 mg in dextrose 5 % 250 mL IVPB (100 mg Intravenous New Bag/Given 07/26/16 1459)  ipratropium-albuterol (DUONEB) 0.5-2.5 (3) MG/3ML nebulizer solution 3 mL (3 mLs Nebulization Given 07/26/16 1340)  methylPREDNISolone sodium succinate (SOLU-MEDROL) 125 mg/2 mL injection 125 mg (125 mg Intravenous Given 07/26/16 1459)  ipratropium-albuterol (DUONEB) 0.5-2.5 (3) MG/3ML nebulizer solution 3 mL (3 mLs Nebulization Given 07/26/16 1458)     Initial Impression / Assessment and  Plan / ED Course  I have reviewed the triage vital signs and the nursing notes.  Pertinent labs & imaging results that were available during my care of the patient were reviewed by me and considered in my medical decision making (see chart for details).  83 year old female presents with COPD exacerbation with hypoxia. She is normally on 3L O2 and desatted to 82% and her  HR jumped to 180 temporarily with ambulation. Gave several breathing tx in the ED as well as dose of steroids and Doxy to cover CAP and abscess on buttock.   CBC remarkable for leukocytosis of 15.1. BMP remarkable for mild hyperglycemia. Troponin is 0.02. CXR shows mild lingular atelectasis. Shared visit with Dr. Silverio Lay. Will admit to hospitalist service.   Final Clinical Impressions(s) / ED Diagnoses   Final diagnoses:  Chronic obstructive pulmonary disease, unspecified COPD type (HCC)  Community acquired pneumonia, unspecified laterality  Abscess of buttock    New Prescriptions New Prescriptions   No medications on file     Bethel Born, PA-C 07/26/16 1552    Charlynne Pander, MD 07/27/16 [phone]

## 2016-07-26 NOTE — H&P (Addendum)
History and Physical    [name]:[id number] DOB: [birth year] DOA: 07/26/2016  Referring MD/NP/PA: Terance Hart  PCP: [name], MD   Outpatient Specialists: Pulmonary, Dr. Alroy Dust; Cardio, Dr. [name]   Patient coming from: home   Chief Complaint: shortness of breath, cough   HPI: [name] is a 83 y.o. female with medical history significant for dyslipidemia, hypertension, DM (on Amaryl and metformin), COPD. She was recently seen by pulmonary (07/20/16) for ongoing shortness of breath for past few weeks or so and was given oxygen at home. She was on 3 L [place] oxygen. Over past 24 hours her shortness of breath and cough productive of clear sputum. Pt dose not recall if she had fevers or chills. No chest pain. No palpitations. No recent sick contacts. No abdominal pain, no diarrhea or constipation.  In EPIC, no recent ED visits or hospitalizations in 2018 or 2017.  ED Course: BP 109/51, HR 70-106, RR 18-28, O2 saturation 89-100% with  oxygen support. Blood work showed WBC count 15.1, hgb 11.9.  Her 12 lead EKG showed atrial fibrillation on admission but her heart rate in now 81. CXR showed mild lingular atelectasis, best identified on lateral view. No other focal abnormality identified. She was given nebulizer treatment in ED, 1 dose of doxycycline. She was admitted for COPD management.   Review of Systems:  Constitutional: Negative for fever, chills, diaphoresis, activity change, appetite change and fatigue.  HENT: Negative for ear pain, nosebleeds, congestion, facial swelling, rhinorrhea, neck pain, neck stiffness and ear discharge.   Eyes: Negative for pain, discharge, redness, itching and visual disturbance.  Respiratory: per HPI Cardiovascular: Negative for chest pain, palpitations and leg swelling.  Gastrointestinal: Negative for abdominal distention.  Genitourinary: Negative for dysuria, urgency, frequency, hematuria, flank pain, decreased urine volume,  difficulty urinating and dyspareunia.  Musculoskeletal: Negative for back pain, joint swelling, arthralgias and gait problem.  Neurological: Negative for dizziness, tremors, seizures, syncope, facial asymmetry, speech difficulty, weakness, light-headedness, numbness and headaches.  Hematological: Negative for adenopathy. Does not bruise/bleed easily.  Psychiatric/Behavioral: Negative for hallucinations, behavioral problems, confusion, dysphoric mood, decreased concentration and agitation.   Past Medical History:  Diagnosis Date  . Benign hypertensive renal disease   . CAD (coronary artery disease)   . Cardiomyopathy    non-ischemic 04/2008 EF 45% cardiac MRI no scar  . CHF (congestive heart failure) (HCC)   . Chronic sinusitis   . COPD (chronic obstructive pulmonary disease) (HCC)   . Depression   . Dyspnea on exertion    chronic  . Hypercholesterolemia   . Hypertension   . Sinus headache    "chronic"  . Stroke [facility]) 1997   "mild"  . Type II diabetes mellitus (HCC)     Past Surgical History:  Procedure Laterality Date  . carotid angiogram     2006  . ESOPHAGOGASTRODUODENOSCOPY     with foreign body removal  . NASAL SINUS SURGERY  ~1974;~ 1976  . TONSILLECTOMY     "when I was a chld"    Social history:  reports that she has been smoking Cigarettes.  She has a 48.00 pack-year smoking history. She has never used smokeless tobacco. She reports that she does not drink alcohol or use drugs.  Ambulation: ambualtes without assistance at baseline   No Known Allergies  Family History  Problem Relation Age of Onset  . Heart disease Father   . Alcohol abuse Sister   . Alcohol abuse Brother   . Coronary  artery disease      family history    Prior to Admission medications   Medication Sig Start Date End Date Taking? Authorizing Provider  acetaminophen (TYLENOL) 500 MG tablet Take 1,000 mg by mouth every 6 (six) hours as needed for pain.   Yes Historical Provider, MD    albuterol (VENTOLIN HFA) 108 (90 Base) MCG/ACT inhaler Inhale 2 puffs into the lungs every 6 (six) hours as needed for wheezing or shortness of breath. 07/11/16  Yes Coralyn Helling, MD  amLODipine (NORVASC) 5 MG tablet Take 1 tablet (5 mg total) by mouth daily. 05/24/16  Yes Rosalio Macadamia, NP  aspirin EC 81 MG tablet Take 81 mg by mouth daily.   Yes Historical Provider, MD  atorvastatin (LIPITOR) 10 MG tablet Take 1 tablet (10 mg total) by mouth daily at 6 PM. 05/24/16  Yes Rosalio Macadamia, NP  benazepril (LOTENSIN) 40 MG tablet Take 1 tablet (40 mg total) by mouth daily. 05/24/16  Yes Rosalio Macadamia, NP  budesonide-formoterol (SYMBICORT) 160-4.5 MCG/ACT inhaler Inhale 2 puffs into the lungs 2 (two) times daily. 06/23/16  Yes Michele Mcalpine, MD  carvedilol (COREG) 6.25 MG tablet Take 1 tablet (6.25 mg total) by mouth 2 (two) times daily with a meal. 05/24/16  Yes Rosalio Macadamia, NP  digoxin (DIGOX) 0.125 MG tablet Take 0.5 tablets (0.0625 mg total) by mouth daily. 05/26/16  Yes Rosalio Macadamia, NP  fluticasone (FLONASE) 50 MCG/ACT nasal spray Place 1 spray into both nostrils daily as needed for allergies. 06/09/16  Yes Historical Provider, MD  furosemide (LASIX) 20 MG tablet Take 20 mg by mouth daily.     Yes Historical Provider, MD  glimepiride (AMARYL) 2 MG tablet Take 2 mg by mouth daily before breakfast.     Yes Historical Provider, MD  metFORMIN (GLUCOPHAGE-XR) 500 MG 24 hr tablet Take 1,000 mg by mouth 2 (two) times daily.  03/13/15  Yes Historical Provider, MD  budesonide-formoterol (SYMBICORT) 160-4.5 MCG/ACT inhaler Inhale 2 puffs into the lungs 2 (two) times daily. 07/11/16 07/12/16  Coralyn Helling, MD  nystatin (MYCOSTATIN) 100000 UNIT/ML suspension Take 5 mLs (500,000 Units total) by mouth 4 (four) times daily. Patient not taking: Reported on 07/26/2016 07/11/16   Coralyn Helling, MD    Physical Exam: Vitals:   07/26/16 1515 07/26/16 1545 07/26/16 1600 07/26/16 1615  BP: (!) 136/56 (!) 134/59 (!) 126/52  117/64  Pulse: 84 (!) 106 84 84  Resp: (!) 21 20 19 18   Temp:      TempSrc:      SpO2: 97% 96% 97% 100%    Constitutional: NAD, calm, comfortable Vitals:   07/26/16 1515 07/26/16 1545 07/26/16 1600 07/26/16 1615  BP: (!) 136/56 (!) 134/59 (!) 126/52 117/64  Pulse: 84 (!) 106 84 84  Resp: (!) 21 20 19 18   Temp:      TempSrc:      SpO2: 97% 96% 97% 100%   Eyes: PERRL, lids and conjunctivae normal ENMT: Mucous membranes are moist. Posterior pharynx clear of any exudate or lesions.Normal dentition.  Neck: normal, supple, no masses, no thyromegaly Respiratory: diminished, wheezing in upper and mid lung lobes Cardiovascular: Tachycardic, appreciate S1. S2 Abdomen: no tenderness, no masses palpated. No hepatosplenomegaly. Bowel sounds positive.  Musculoskeletal: no clubbing / cyanosis. No joint deformity upper and lower extremities. Good ROM, no contractures. Normal muscle tone.  Skin: no rashes, lesions, ulcers. No induration Neurologic: CN 2-12 grossly intact. Sensation intact, DTR normal. Strength 5/5  in all 4.  Psychiatric: Normal judgment and insight. Alert and oriented x 3. Normal mood.   Labs on Admission: I have personally reviewed following labs and imaging studies  CBC:  Recent Labs Lab 07/26/16 1211  WBC 15.1*  NEUTROABS 11.9*  HGB 11.9*  HCT 37.0  MCV 94.9  PLT 228   Basic Metabolic Panel:  Recent Labs Lab 07/26/16 1211  NA 131*  K 4.9  CL 89*  CO2 31  GLUCOSE 143*  BUN 16  CREATININE 0.81  CALCIUM 9.6   GFR: CrCl cannot be calculated (Unknown ideal weight.). Liver Function Tests: No results for input(s): AST, ALT, ALKPHOS, BILITOT, PROT, ALBUMIN in the last 168 hours. No results for input(s): LIPASE, AMYLASE in the last 168 hours. No results for input(s): AMMONIA in the last 168 hours. Coagulation Profile: No results for input(s): INR, PROTIME in the last 168 hours. Cardiac Enzymes: No results for input(s): CKTOTAL, CKMB, CKMBINDEX, TROPONINI in  the last 168 hours. BNP (last 3 results) No results for input(s): PROBNP in the last 8760 hours. HbA1C: No results for input(s): HGBA1C in the last 72 hours. CBG: No results for input(s): GLUCAP in the last 168 hours. Lipid Profile: No results for input(s): CHOL, HDL, LDLCALC, TRIG, CHOLHDL, LDLDIRECT in the last 72 hours. Thyroid Function Tests: No results for input(s): TSH, T4TOTAL, FREET4, T3FREE, THYROIDAB in the last 72 hours. Anemia Panel: No results for input(s): VITAMINB12, FOLATE, FERRITIN, TIBC, IRON, RETICCTPCT in the last 72 hours. Urine analysis:    Component Value Date/Time   COLORURINE YELLOW 04/23/2016 1755   APPEARANCEUR HAZY (A) 04/23/2016 1755   LABSPEC 1.011 04/23/2016 1755   PHURINE 5.0 04/23/2016 1755   GLUCOSEU NEGATIVE 04/23/2016 1755   GLUCOSEU NEGATIVE 05/13/2010 1224   HGBUR NEGATIVE 04/23/2016 1755   BILIRUBINUR NEGATIVE 04/23/2016 1755   KETONESUR NEGATIVE 04/23/2016 1755   PROTEINUR NEGATIVE 04/23/2016 1755   UROBILINOGEN 0.2 01/13/2015 1638   NITRITE NEGATIVE 04/23/2016 1755   LEUKOCYTESUR SMALL (A) 04/23/2016 1755   Sepsis Labs: @LABRCNTIP (procalcitonin:4,lacticidven:4) )No results found for this or any previous visit (from the past 240 hour(s)).   Radiological Exams on Admission: Dg Chest 2 View Result Date: 07/26/2016 Mild lingular atelectasis, best identified on lateral view. No other focal abnormality identified. Exam otherwise stable from prior exams. Electronically Signed   By: Maisie Fus  Register   On: 07/26/2016 12:39    EKG: Independently reviewed. Atrial fibrillation  Assessment/Plan   Principal Problem:   Acute on chronic respiratory failure with hypoxia (HCC) /  COPD with acute exacerbation (HCC) - Started duoneb every 4 hours sch and every 2 hours as needed for shortness of breath or wheezing - Continue oxygen support via [place] to keep O2 sat above 90% - Continue empiric azithromycin - Continue solumedrol 60 mg IV Q 6 hours    Active Problems:   Acute bronchitis - Started empiric azithromycin    Chronic diastolic heart failure (HCC)  - 2 D EHCO in 2012 showed normal EF and grade 1 DD - Continue lasix and digoxin     New onset a fib - Initially HR in low 100's but no in 80's - Repeat 12 lead EKG    Essential hypertension - Resumed home meds: Norvasc and benazepril     Dyslipidemia associated with type 2 diabetes mellitus (HCC) - Continue Lipitor    Diabetes mellitus without complication, without long-term current use of insulin (HCC) - Continue Amaryl and metformin     Underweight - Nutrition consulted -  Body mass index is 17.51 kg/m.    DVT prophylaxis: Lovenox subQ Code Status: full code Family Communication: family at the bedside  Disposition Plan: admission to telemetry due to a fib Consults called: none  Admission status: inpatient; pt with COPD exacerbation, atrial fibrillation which will need to be monitored on tele in case HR accelerates. She needs sch and as needed neb treatments and scheduled solumedrol due to wheezing on physical exam. Also, started IV Zithromax for possible acute bronchitis.    Manson Passey MD [facility] Pager [phone]  If 7PM-7AM, please contact night-coverage [email or web address] Password [facility]  07/26/2016, 4:35 PM

## 2016-07-26 NOTE — Progress Notes (Signed)
New pt admission from ED. Pt brought to the floor in stable condition. Vitals taken. Initial Assessment done. All immediate pertinent needs to patient addressed. Patient Guide given to patient. Important safety instructions relating to hospitalization reviewed with patient. Patient verbalized understanding. Will continue to monitor pt.  Yeriel Mineo RN 

## 2016-07-26 NOTE — Discharge Instructions (Signed)
Take antibiotic and steroids to help with breathing Continue home breathing treatments Follow up with your pulmonologist

## 2016-07-27 ENCOUNTER — Encounter (HOSPITAL_COMMUNITY): Payer: Self-pay | Admitting: Radiology

## 2016-07-27 ENCOUNTER — Inpatient Hospital Stay (HOSPITAL_COMMUNITY): Payer: Medicare Other

## 2016-07-27 DIAGNOSIS — J449 Chronic obstructive pulmonary disease, unspecified: Secondary | ICD-10-CM

## 2016-07-27 DIAGNOSIS — L8990 Pressure ulcer of unspecified site, unspecified stage: Secondary | ICD-10-CM | POA: Insufficient documentation

## 2016-07-27 LAB — GLUCOSE, CAPILLARY
Glucose-Capillary: 213 mg/dL — ABNORMAL HIGH (ref 65–99)
Glucose-Capillary: 220 mg/dL — ABNORMAL HIGH (ref 65–99)
Glucose-Capillary: 223 mg/dL — ABNORMAL HIGH (ref 65–99)
Glucose-Capillary: 149 mg/dL — ABNORMAL HIGH (ref 65–99)
Glucose-Capillary: 120 mg/dL — ABNORMAL HIGH (ref 65–99)

## 2016-07-27 LAB — BASIC METABOLIC PANEL
Anion gap: 11 (ref 5–15)
BUN: 27 mg/dL — AB (ref 6–20)
CALCIUM: 9 mg/dL (ref 8.9–10.3)
CO2: 31 mmol/L (ref 22–32)
CREATININE: 1.16 mg/dL — AB (ref 0.44–1.00)
Chloride: 86 mmol/L — ABNORMAL LOW (ref 101–111)
GFR calc non Af Amer: 42 mL/min — ABNORMAL LOW (ref >60)
GFR, EST AFRICAN AMERICAN: 49 mL/min — AB (ref >60)
GLUCOSE: 188 mg/dL — AB (ref 65–99)
Potassium: 4.6 mmol/L (ref 3.5–5.1)
Sodium: 128 mmol/L — ABNORMAL LOW (ref 135–145)

## 2016-07-27 LAB — BLOOD GAS, ARTERIAL
ACID-BASE EXCESS: 5.7 mmol/L — AB (ref 0.0–2.0)
Bicarbonate: 30.2 mmol/L — ABNORMAL HIGH (ref 20.0–28.0)
DELIVERY SYSTEMS: POSITIVE
DRAWN BY: 227661
Expiratory PAP: 6
FIO2: 50
Inspiratory PAP: 12
O2 Saturation: 98 %
PATIENT TEMPERATURE: 98.6
PH ART: 7.416 (ref 7.350–7.450)
pCO2 arterial: 47.9 mmHg (ref 32.0–48.0)
pO2, Arterial: 116 mmHg — ABNORMAL HIGH (ref 83.0–108.0)

## 2016-07-27 LAB — CBC
HCT: 33.6 % — ABNORMAL LOW (ref 36.0–46.0)
Hemoglobin: 10.9 g/dL — ABNORMAL LOW (ref 12.0–15.0)
MCH: 30.4 pg (ref 26.0–34.0)
MCHC: 32.4 g/dL (ref 30.0–36.0)
MCV: 93.6 fL (ref 78.0–100.0)
PLATELETS: 219 10*3/uL (ref 150–400)
RBC: 3.59 MIL/uL — AB (ref 3.87–5.11)
RDW: 13 % (ref 11.5–15.5)
WBC: 13.4 10*3/uL — ABNORMAL HIGH (ref 4.0–10.5)

## 2016-07-27 LAB — MRSA PCR SCREENING: MRSA BY PCR: NEGATIVE

## 2016-07-27 MED ORDER — ENSURE ENLIVE PO LIQD
237.0000 mL | Freq: Three times a day (TID) | ORAL | Status: DC
  Administered 2016-07-27 – 2016-07-28 (×2): 237 mL via ORAL

## 2016-07-27 MED ORDER — IOPAMIDOL (ISOVUE-370) INJECTION 76%
INTRAVENOUS | Status: AC
  Administered 2016-07-27: 75 mL
  Filled 2016-07-27: qty 100

## 2016-07-27 MED ORDER — METHYLPREDNISOLONE SODIUM SUCC 125 MG IJ SOLR
60.0000 mg | Freq: Once | INTRAMUSCULAR | Status: AC
  Administered 2016-07-27: 60 mg via INTRAVENOUS
  Filled 2016-07-27: qty 2

## 2016-07-27 MED ORDER — ENOXAPARIN SODIUM 40 MG/0.4ML ~~LOC~~ SOLN
40.0000 mg | SUBCUTANEOUS | Status: DC
  Administered 2016-07-27: 40 mg via SUBCUTANEOUS
  Filled 2016-07-27 (×2): qty 0.4

## 2016-07-27 MED ORDER — AZITHROMYCIN 250 MG PO TABS
250.0000 mg | ORAL_TABLET | Freq: Every day | ORAL | Status: DC
  Administered 2016-07-28 – 2016-08-02 (×6): 250 mg via ORAL
  Filled 2016-07-27 (×6): qty 1

## 2016-07-27 MED ORDER — METHYLPREDNISOLONE SODIUM SUCC 125 MG IJ SOLR
80.0000 mg | Freq: Four times a day (QID) | INTRAMUSCULAR | Status: DC
  Administered 2016-07-27 – 2016-07-28 (×4): 80 mg via INTRAVENOUS
  Filled 2016-07-27 (×4): qty 2

## 2016-07-27 MED ORDER — INSULIN ASPART 100 UNIT/ML ~~LOC~~ SOLN: 0.0000 [IU] | Freq: Three times a day (TID) | SUBCUTANEOUS | Status: DC

## 2016-07-27 MED ORDER — ADULT MULTIVITAMIN W/MINERALS CH
1.0000 | ORAL_TABLET | Freq: Every day | ORAL | Status: DC
  Administered 2016-07-27 – 2016-08-02 (×7): 1 via ORAL
  Filled 2016-07-27 (×7): qty 1

## 2016-07-27 MED ORDER — AZITHROMYCIN 500 MG PO TABS: 500.0000 mg | ORAL_TABLET | Freq: Every day | ORAL | Status: DC

## 2016-07-27 MED ORDER — ORAL CARE MOUTH RINSE
15.0000 mL | Freq: Two times a day (BID) | OROMUCOSAL | Status: DC
  Administered 2016-07-27 – 2016-08-02 (×10): 15 mL via OROMUCOSAL

## 2016-07-27 MED ORDER — IPRATROPIUM-ALBUTEROL 0.5-2.5 (3) MG/3ML IN SOLN
3.0000 mL | Freq: Four times a day (QID) | RESPIRATORY_TRACT | Status: DC
  Administered 2016-07-27 (×4): 3 mL via RESPIRATORY_TRACT
  Filled 2016-07-27 (×4): qty 3

## 2016-07-27 NOTE — Progress Notes (Addendum)
PROGRESS NOTE    [name]  WUJ:[id number] DOB: 04-23-1930 DOA: 07/26/2016 PCP: [name], MD  Brief Narrative: [name] is a 83 y.o. female with medical history significant for dyslipidemia, hypertension, DM (on Amaryl and metformin), COPD. She was recently seen by pulmonary (07/20/16) for ongoing shortness of breath for past few weeks or so and was given oxygen at home, Prednisone, Abx, started on 3 L  oxygen. Over past 24 hours her shortness of breath and cough productive of clear sputum.  Her 12 lead EKG showed atrial fibrillation on admission but her heart rate in now 81. CXR showed mild lingular atelectasis 4/4: noted to have more resp distress, hypoxia  Assessment & Plan:    Acute on chronic respiratory failure with hypoxia (HCC) /  - COPD with acute exacerbation with increased distress and hypoxia this am, despite duonebs, solumedrol, Abx-doxycycline -will check ABG, Start BIPAP, increase solumedrol -check CTA chest r/o PE and increase lovenox to full dose in the interim -transfer to SDU    Chronic diastolic heart failure (HCC)  - 2 D EHCO in 2012 showed normal EF and grade 1 DD - Continue digoxin, appears dry will hold lasix    PACs -on Admission EKG-not afib, p waves noted, monitor, in NSR now    Essential hypertension - Resumed home meds: Norvasc, hold benazepril     Dyslipidemia associated with type 2 diabetes mellitus (HCC) - Continue Lipitor    Diabetes mellitus without complication, without long-term current use of insulin (HCC) - hold Amaryl and metformin  -SSI for now  Severe malnutrition - Nutrition consulted - Body mass index is 17.51 kg/m.   DVT prophylaxis: Lovenox subQ Code Status: full code Family Communication: daughter at bedside  Disposition Plan: admission to telemetry due to a fib  Antimicrobials:   Doxycycline   Subjective: Having more shortness of breath today  Objective: Vitals:   07/27/16 1034 07/27/16 1100  07/27/16 1130 07/27/16 1133  BP:    (!) 117/58  Pulse: 93 75    Resp:  18  17  Temp:    97.8 F (36.6 C)  TempSrc:    Axillary  SpO2:      Weight:   37.3 kg (82 lb 4.8 oz)   Height:   5\' 2"  (1.575 m)     Intake/Output Summary (Last 24 hours) at 07/27/16 1420 Last data filed at 07/27/16 0856  Gross per 24 hour  Intake            421.5 ml  Output                0 ml  Net            421.5 ml   Filed Weights   07/26/16 1800 07/27/16 0448 07/27/16 1130  Weight: 46.3 kg (102 lb) 37.3 kg (82 lb 4.8 oz) 37.3 kg (82 lb 4.8 oz)    Examination:  General exam: Appears calm and comfortable  Respiratory system: Clear to auscultation. Respiratory effort normal. Cardiovascular system: S1 & S2 heard, RRR. No JVD, murmurs, rubs, gallops or clicks. No pedal edema. Gastrointestinal system: Abdomen is nondistended, soft and nontender. No organomegaly or masses felt. Normal bowel sounds heard. Central nervous system: Alert and oriented. No focal neurological deficits. Extremities: Symmetric 5 x 5 power. Skin: No rashes, lesions or ulcers Psychiatry: Judgement and insight appear normal. Mood & affect appropriate.     Data Reviewed:   CBC:  Recent Labs Lab 07/26/16 1211 07/27/16 0530  WBC 15.1* 13.4*  NEUTROABS 11.9*  --   HGB 11.9* 10.9*  HCT 37.0 33.6*  MCV 94.9 93.6  PLT 228 219   Basic Metabolic Panel:  Recent Labs Lab 07/26/16 1211 07/27/16 0530  NA 131* 128*  K 4.9 4.6  CL 89* 86*  CO2 31 31  GLUCOSE 143* 188*  BUN 16 27*  CREATININE 0.81 1.16*  CALCIUM 9.6 9.0   GFR: Estimated Creatinine Clearance: 21.3 mL/min (A) (by C-G formula based on SCr of 1.16 mg/dL (H)). Liver Function Tests: No results for input(s): AST, ALT, ALKPHOS, BILITOT, PROT, ALBUMIN in the last 168 hours. No results for input(s): LIPASE, AMYLASE in the last 168 hours. No results for input(s): AMMONIA in the last 168 hours. Coagulation Profile: No results for input(s): INR, PROTIME in the last  168 hours. Cardiac Enzymes: No results for input(s): CKTOTAL, CKMB, CKMBINDEX, TROPONINI in the last 168 hours. BNP (last 3 results) No results for input(s): PROBNP in the last 8760 hours. HbA1C: No results for input(s): HGBA1C in the last 72 hours. CBG:  Recent Labs Lab 07/26/16 2110 07/26/16 2257 07/27/16 0424 07/27/16 0734 07/27/16 1308  GLUCAP 444* 403* 213* 220* 223*   Lipid Profile: No results for input(s): CHOL, HDL, LDLCALC, TRIG, CHOLHDL, LDLDIRECT in the last 72 hours. Thyroid Function Tests: No results for input(s): TSH, T4TOTAL, FREET4, T3FREE, THYROIDAB in the last 72 hours. Anemia Panel: No results for input(s): VITAMINB12, FOLATE, FERRITIN, TIBC, IRON, RETICCTPCT in the last 72 hours. Urine analysis:    Component Value Date/Time   COLORURINE YELLOW 04/23/2016 1755   APPEARANCEUR HAZY (A) 04/23/2016 1755   LABSPEC 1.011 04/23/2016 1755   PHURINE 5.0 04/23/2016 1755   GLUCOSEU NEGATIVE 04/23/2016 1755   GLUCOSEU NEGATIVE 05/13/2010 1224   HGBUR NEGATIVE 04/23/2016 1755   BILIRUBINUR NEGATIVE 04/23/2016 1755   KETONESUR NEGATIVE 04/23/2016 1755   PROTEINUR NEGATIVE 04/23/2016 1755   UROBILINOGEN 0.2 01/13/2015 1638   NITRITE NEGATIVE 04/23/2016 1755   LEUKOCYTESUR SMALL (A) 04/23/2016 1755   Sepsis Labs: @LABRCNTIP (procalcitonin:4,lacticidven:4)  ) Recent Results (from the past 240 hour(s))  MRSA PCR Screening     Status: None   Collection Time: 07/27/16 11:50 AM  Result Value Ref Range Status   MRSA by PCR NEGATIVE NEGATIVE Final    Comment:        The GeneXpert MRSA Assay (FDA approved for NASAL specimens only), is one component of a comprehensive MRSA colonization surveillance program. It is not intended to diagnose MRSA infection nor to guide or monitor treatment for MRSA infections.          Radiology Studies: Dg Chest 2 View  Result Date: 07/26/2016 CLINICAL DATA:  Shortness of breath. EXAM: CHEST  2 VIEW COMPARISON:  04/23/2016.   05/26/2015. FINDINGS: Mediastinum and hilar structures are normal. Cardiomegaly with normal pulmonary vascularity. Mild lingular subsegmental atelectasis, best identified on lateral view. This is new from prior exams. Stable mild basal pleural thickening noted consistent scarring. No pneumothorax. Heart size stable. Degenerative changes and scoliosis thoracic spine . IMPRESSION: Mild lingular atelectasis, best identified on lateral view. No other focal abnormality identified. Exam otherwise stable from prior exams. Electronically Signed   By: Maisie Fus  Register   On: 07/26/2016 12:39        Scheduled Meds: . amLODipine  5 mg Oral Daily  . aspirin EC  81 mg Oral Daily  . atorvastatin  10 mg Oral q1800  . [START ON 07/28/2016] azithromycin  250 mg Oral Daily  .  carvedilol  6.25 mg Oral BID WC  . digoxin  0.0625 mg Oral Daily  . enoxaparin (LOVENOX) injection  40 mg Subcutaneous Q24H  . feeding supplement (ENSURE ENLIVE)  237 mL Oral BID BM  . insulin aspart  0-5 Units Subcutaneous QHS  . insulin aspart  0-9 Units Subcutaneous TID WC  . ipratropium-albuterol  3 mL Nebulization Q6H  . mouth rinse  15 mL Mouth Rinse BID  . methylPREDNISolone (SOLU-MEDROL) injection  80 mg Intravenous Q6H   Continuous Infusions: . sodium chloride 10 mL/hr at 07/26/16 2011     LOS: 1 day    Time spent:    Zannie Cove, MD [facility] Pager [phone]  If 7PM-7AM, please contact night-coverage [email or web address] Password [email or web address] 07/27/2016, 2:20 PM

## 2016-07-27 NOTE — Significant Event (Signed)
Rapid Response Event Note  Overview:  Called by RT Wilkie Aye to assist with patient with resp distress Time Called: 0945 Arrival Time: 0950 Event Type: Respiratory  Initial Focused Assessment:  On arrival Dr. Jomarie Longs at bedside - patient alert - warm and dry - mentating but having trouble speaking full sentences - resps rapid using accessory muscles and some retracations.  BIl BS very tight little air movement noted - RT reports she just had 2 nebulizer treatments which helped with the wheezing.  ST 110 RR 38 O2 sats 96% on 6 liters nasal cannula.     Interventions:  Placed on Bipap 12/6 50% oxygen.  ABG drawn.  Within few minutes decreased WOB - patient remains alert can speak full sentences - states she feels much better.  BP 110/71 HR 85 RR 20 O2 sats 100% - weaning oxygen percent per RT.  ABG results to Dr. Jomarie Longs.  Patient resting without issues. Second IV started right forearm times one attempt - per order Dr. Jomarie Longs.  Transferred to 4e21.  Handoff to Salome RN - to move to 4E.    Plan of Care (if not transferred):  Event Summary: Name of Physician Notified: Dr. Jomarie Longs at  (PTA RRT)    at    Outcome: Transferred (Comment) [phone])  Event End Time: 1130  [place]

## 2016-07-27 NOTE — Progress Notes (Signed)
RT weaned pt off Bipap placed on 3 L N/C sats 100 % Resp rate 18 resting easy & looks comfortable

## 2016-07-27 NOTE — Progress Notes (Signed)
Called to patient's room that she was SOb and had increased WOB.  Upon arrival pt had audible expiratory wheezes, elevated HR of 120-130's, retractions and unable to speak full sentences.  Gave PRN Duoneb treatment and increased oxygen from 3 to 6 lpm [place]. Sat increased to 97%.  MD was notified and Rapid Response RN.  Placed pt on Bipap of 12/6 with 50% oxygen decreasing to 40% when sat reached 100%.  Sent ABG off for analysis as well.  Monitoring patient until transfer to stepdown unit.

## 2016-07-27 NOTE — Progress Notes (Signed)
Initial Nutrition Assessment  DOCUMENTATION CODES:   Severe malnutrition in context of chronic illness, Underweight  INTERVENTION:   -Once diet is advanced: Ensure Enlive po TID, each supplement provides 350 kcal and 20 grams of protein -MVI daily  NUTRITION DIAGNOSIS:   Malnutrition (Severe) related to chronic illness (COPD) as evidenced by severe depletion of muscle mass, severe depletion of body fat, energy intake < or equal to 75% for > or equal to 1 month.  GOAL:   Patient will meet greater than or equal to 90% of their needs  MONITOR:   PO intake, Supplement acceptance, Diet advancement, Labs, Weight trends, Skin, I & O's  REASON FOR ASSESSMENT:   Malnutrition Screening Tool, Consult Assessment of nutrition requirement/status  ASSESSMENT:   [name] is a 83 y.o. female with medical history significant for dyslipidemia, hypertension, DM (on Amaryl and metformin), COPD. She was recently seen by pulmonary (07/20/16) for ongoing shortness of breath for past few weeks or so and was given oxygen at home.  Pt admitted with acute on chronic respiratory failure with hypoxia and COPD exacerbation.   Pt transferred from floor to SDU today related to respiratory distress. Pt on Bi-pap at time of visit.   Spoke with pt who reports good appetite PTA. She shares that she consumes 3 meals per day, which consist of "mostly vegetables". Pt reports she also consumes 1-2 Ensure supplements daily. Pt reports she "ate most" of her breakfast this AM- 50% meal completion per doc flowsheet records. Suspect intake was inadequate PTA, especially due to reports that intake is low in protein.  Pt admits she has lost weight, however, unable to provide further hx. She reports she has lost weight and can tell due her clothing feeling looser and lost muscle mass. Per wt hx, wt has been stable > 1 year.   Nutrition-Focused physical exam completed. Findings are moderate to severe fat depletion,  moderate to severe muscle depletion, and no edema.   Given pt's severe malnutrition, would benefit from oral nutrition supplement of higher nutrient density (Ensure Enlive supplement provides 350 kcal, 20 grams of protein, 44 grams of carbohydrate vs Glucerna Shake supplement provides 220 kcal, 10 grams of protein, 26 grams carbohydrates). RD will monitor PO intake and CBGS and adjust supplement regimen as necessary.   Medications reviewed and include solu-medrol. Pt also on insulin sensitive coverage.   Labs reviewed: Na: 128, CBGS: 213-44.   Diet Order:  Diet NPO time specified Except for: Sips with Meds  Skin:  Wound (see comment) (stage II lt buttocks)  Last BM:  07/26/16  Height:   Ht Readings from Last 1 Encounters:  07/27/16 5\' 2"  (1.575 m)    Weight:   Wt Readings from Last 1 Encounters:  07/27/16 82 lb 4.8 oz (37.3 kg)    Ideal Body Weight:  50 kg  BMI:  Body mass index is 15.05 kg/m.  Estimated Nutritional Needs:   Kcal:  1300-1500  Protein:  60-75 grams  Fluid:  1.3-1.5 L  EDUCATION NEEDS:   Education needs addressed  Tewana Bohlen A. Mayford Knife, RD, LDN, CDE Pager: [phone] After hours Pager: [phone]

## 2016-07-27 NOTE — Progress Notes (Addendum)
Nurse called 4E to give report, nurse taking report busy, states will call back after 10 minutes.  Elnita Maxwell, RN   Report given to nurse  Junius Roads at 4E.  Elnita Maxwell, RN

## 2016-07-27 NOTE — Progress Notes (Signed)
Patient arrived on 4E on BiPAP. Seems to be doing well. Will attempt to take her off in a bit if she continues to do well.

## 2016-07-27 NOTE — Progress Notes (Addendum)
Patient complains of shortness of breath.  Oxygen level 90 at 3.5L .  Patient having audible expiratory wheezes at this time.  HOB elevated 90 degrees.  Patient kept comfortable  Family at bedside.  MD and respiratory therapist notified. Nurse at bedside.  Will continue to monitor. Elnita Maxwell, RN   Rapid response at bedside.  Patient placed on bipap per MD orders.  Patient has order for transfer to step down unit.  Awaiting for bed availability.  Elnita Maxwell, RN

## 2016-07-27 NOTE — Progress Notes (Signed)
ANTICOAGULATION CONSULT NOTE - Initial Consult  Pharmacy Consult for Lovenox Indication: pulmonary embolus - rule out  No Known Allergies  Patient Measurements: Height: 5\' 4"  (162.6 cm) Weight: 82 lb 4.8 oz (37.3 kg) (took weight twice while standing) IBW/kg (Calculated) : 54.7  Vital Signs: Temp: 98 F (36.7 C) (04/04 0900) Temp Source: Oral (04/04 0900) BP: 197/86 (04/04 0900) Pulse Rate: 107 (04/04 0900)  Labs:  Recent Labs  07/26/16 1211 07/27/16 0530  HGB 11.9* 10.9*  HCT 37.0 33.6*  PLT 228 219  CREATININE 0.81 1.16*    Estimated Creatinine Clearance: 21.3 mL/min (A) (by C-G formula based on SCr of 1.16 mg/dL (H)).   Medical History: Past Medical History:  Diagnosis Date  . Benign hypertensive renal disease   . CAD (coronary artery disease)   . Cardiomyopathy    non-ischemic 04/2008 EF 45% cardiac MRI no scar  . CHF (congestive heart failure) (HCC)   . Chronic sinusitis   . COPD (chronic obstructive pulmonary disease) (HCC)   . Depression   . Dyspnea on exertion    chronic  . Hypercholesterolemia   . Hypertension   . On home oxygen therapy    "3L; 24/7; for the last 2 1/2 weeks; none before that" (07/26/2016)  . Sinus headache    "chronic"  . Stroke [facility]) 1997   "mild"  . Type II diabetes mellitus (HCC)     Assessment: 83 year old female admitted with shortness of breath and cough to begin Lovenox until rule out for PE / clot  Goal of Therapy:  Anti-Xa level 0.6-1 units/ml 4hrs after LMWH dose given Monitor platelets by anticoagulation protocol: Yes   Plan:  Lovenox 40 sq Q 24 hours --> treatment dose for her with renal function and weight Follow up CTA  Thank you Okey Regal, PharmD [phone]  07/27/2016,10:11 AM

## 2016-07-27 NOTE — Progress Notes (Signed)
RT NOTE:  Pt resting comfortably on 3L [place] @ this time. No SOB, WOB normal. BiPAP @ bedside if needed.

## 2016-07-27 NOTE — Progress Notes (Signed)
Pt arrived from 3 east CHG done on Bipap

## 2016-07-28 DIAGNOSIS — E43 Unspecified severe protein-calorie malnutrition: Secondary | ICD-10-CM | POA: Insufficient documentation

## 2016-07-28 DIAGNOSIS — L0231 Cutaneous abscess of buttock: Secondary | ICD-10-CM

## 2016-07-28 LAB — GLUCOSE, CAPILLARY
GLUCOSE-CAPILLARY: 422 mg/dL — AB (ref 65–99)
Glucose-Capillary: 174 mg/dL — ABNORMAL HIGH (ref 65–99)
Glucose-Capillary: 471 mg/dL — ABNORMAL HIGH (ref 65–99)
Glucose-Capillary: 467 mg/dL — ABNORMAL HIGH (ref 65–99)
Glucose-Capillary: 244 mg/dL — ABNORMAL HIGH (ref 65–99)

## 2016-07-28 MED ORDER — INSULIN ASPART 100 UNIT/ML ~~LOC~~ SOLN
0.0000 [IU] | Freq: Every day | SUBCUTANEOUS | Status: DC
  Administered 2016-07-28: 2 [IU] via SUBCUTANEOUS
  Administered 2016-07-30: 3 [IU] via SUBCUTANEOUS
  Administered 2016-08-01: 4 [IU] via SUBCUTANEOUS

## 2016-07-28 MED ORDER — METHYLPREDNISOLONE SODIUM SUCC 125 MG IJ SOLR
80.0000 mg | Freq: Two times a day (BID) | INTRAMUSCULAR | Status: DC
  Administered 2016-07-28: 80 mg via INTRAVENOUS
  Filled 2016-07-28 (×3): qty 2

## 2016-07-28 MED ORDER — INSULIN GLARGINE 100 UNIT/ML ~~LOC~~ SOLN
15.0000 [IU] | Freq: Every day | SUBCUTANEOUS | Status: DC
  Administered 2016-07-28: 15 [IU] via SUBCUTANEOUS
  Filled 2016-07-28: qty 0.15

## 2016-07-28 MED ORDER — ENOXAPARIN SODIUM 30 MG/0.3ML ~~LOC~~ SOLN
30.0000 mg | SUBCUTANEOUS | Status: DC
  Administered 2016-07-28 – 2016-08-02 (×6): 30 mg via SUBCUTANEOUS
  Filled 2016-07-28 (×6): qty 0.3

## 2016-07-28 MED ORDER — INSULIN ASPART 100 UNIT/ML ~~LOC~~ SOLN
0.0000 [IU] | Freq: Three times a day (TID) | SUBCUTANEOUS | Status: DC
  Administered 2016-07-29: 7 [IU] via SUBCUTANEOUS
  Administered 2016-07-29: 11 [IU] via SUBCUTANEOUS
  Administered 2016-07-30: 20 [IU] via SUBCUTANEOUS
  Administered 2016-07-30: 7 [IU] via SUBCUTANEOUS
  Administered 2016-07-31: 3 [IU] via SUBCUTANEOUS
  Administered 2016-07-31: 7 [IU] via SUBCUTANEOUS
  Administered 2016-07-31: 20 [IU] via SUBCUTANEOUS
  Administered 2016-08-01 (×2): 11 [IU] via SUBCUTANEOUS
  Administered 2016-08-01: 7 [IU] via SUBCUTANEOUS
  Administered 2016-08-02: 11 [IU] via SUBCUTANEOUS

## 2016-07-28 MED ORDER — FUROSEMIDE 10 MG/ML IJ SOLN
40.0000 mg | Freq: Every day | INTRAMUSCULAR | Status: DC
  Administered 2016-07-28 – 2016-07-29 (×2): 40 mg via INTRAVENOUS
  Filled 2016-07-28 (×2): qty 4

## 2016-07-28 MED ORDER — GLUCERNA SHAKE PO LIQD
237.0000 mL | Freq: Two times a day (BID) | ORAL | Status: DC
  Administered 2016-07-29: 14:00:00 via ORAL
  Administered 2016-07-29 – 2016-08-02 (×8): 237 mL via ORAL

## 2016-07-28 MED ORDER — INSULIN ASPART 100 UNIT/ML ~~LOC~~ SOLN
15.0000 [IU] | Freq: Once | SUBCUTANEOUS | Status: AC
  Administered 2016-07-28: 15 [IU] via SUBCUTANEOUS

## 2016-07-28 MED ORDER — IPRATROPIUM-ALBUTEROL 0.5-2.5 (3) MG/3ML IN SOLN
3.0000 mL | Freq: Three times a day (TID) | RESPIRATORY_TRACT | Status: DC
  Administered 2016-07-28 – 2016-07-29 (×4): 3 mL via RESPIRATORY_TRACT
  Filled 2016-07-28 (×4): qty 3

## 2016-07-28 NOTE — Plan of Care (Signed)
Problem: Safety: Goal: Ability to remain free from injury will improve Outcome: Progressing Patient demonstrating some impulsive behavior in relation to use of bedside commode. Bed alarms utilized and bed in lowest position. Frequent education/reminders/ reorientation about safety and calling when she needs to get up.   Problem: Physical Regulation: Goal: Ability to maintain clinical measurements within normal limits will improve Outcome: Progressing Patient transferred to our unit from Med/Surg for monitoring of respiratory status. Patient was able to come off bipap a short time after transfer. Patient has since remained on Nasal Cannula 3L in no distress with lungs clear.

## 2016-07-28 NOTE — Consult Note (Addendum)
   [facility] CM Inpatient Consult   07/28/2016  [name] February 07, 1930 [id number]    [name] was active with [facility] Care Management in the past. Spoke with [name] and daughter, [name], at bedside to discuss re-engaging with [facility] Care Management services. Both are agreeable and [facility] Care Management written consent obtained.   [name] (daughter) lives with patient and she is with her during the day as [name] works from home. Confirms Primary Care Provider is Dr. Docia Chuck. [name] endorses that [name] has not been weighing daily. States her blood sugars have been up and down. Daughter relates the "up and down" blood sugars to decrease intake. [name] (daughter) also reports she is concerned with her mother's intake. States she tries to increase [name] nutrition with boost and small meals, however, [name] simply does not eat much. States there has been some weight loss as well. [name] also reports that patient was recently placed on continuous home oxygen prior to admission.  [name] (daughter) also reports that she is looking into getting CAPS services for her mother for further assistance.. States she has requested an application but has not received it yet. Discussed that writer will ask that the [facility] RNCM assess if [facility] LCSW is needed for follow up. Asked [name] to please call back to find out what the status is of the mailed application for CAPS. She is agreeable to this.  Discussed [facility] Care Management follow up. [name] (daughter) states she should be called for post discharge calls. Confirms best contact number as [phone]. Written consent reflects this.  Will request for [facility] RNCM to be assigned due to concern for readmission and for multiple co-morbidities such as COPD, DM, HTN, CAD, CHF.  Sent notification to [facility] contact person to make aware that [facility] Care Management will follow post discharge. Left voicemail for inpatient RNCM to make aware  that [facility] Care Management will follow.  [facility] Care Management packet and contact information provided. Patient and daughter were appreciative of visit.    Raiford Noble, MSN-Ed, RN,BSN [facility] Liaison [phone]

## 2016-07-28 NOTE — Progress Notes (Signed)
PROGRESS NOTE    [name]  ZOX:[id number] DOB: 12-06-1929 DOA: 07/26/2016 PCP: Debra Bussing, MD  Brief Narrative: [name] is a 83 y.o. female with medical history significant for dyslipidemia, hypertension, DM (on Amaryl and metformin), COPD. She was recently seen by pulmonary (07/20/16) for ongoing shortness of breath for past few weeks or so and was given oxygen at home, Prednisone, Abx, started on 3 L [place] oxygen. Over past 24 hours her shortness of breath and cough productive of clear sputum.  Her 12 lead EKG showed atrial fibrillation on admission but her heart rate in now 81. CXR showed mild lingular atelectasis 4/4: noted to have more resp distress, hypoxia  Assessment & Plan:    Acute on chronic respiratory failure with hypoxia (HCC) /  - COPD with acute exacerbation -continue IV solumedrol-cut down dose, nebs, Abx-doxycycline -off BIPAP now -IV lasix today, check repeat ECHO -transfer to floor tomorrow if remains stable    ACute on Chronic diastolic heart failure (HCC)  - 2 D EHCO in 2012 showed normal EF and grade 1 DD - Continue digoxin, IV lasix today    PACs -on Admission EKG-not afib, p waves noted, monitor, in NSR now    Essential hypertension - Resumed home meds: Norvasc, hold benazepril     Dyslipidemia associated with type 2 diabetes mellitus (HCC) - Continue Lipitor    Diabetes mellitus without complication, without long-term current use of insulin (HCC) - hold Amaryl and metformin  -SSI for now  Severe malnutrition - Nutrition consulted - Body mass index is 17.51 kg/m.   DVT prophylaxis: Lovenox subQ Code Status: full code Family Communication: daughter at bedside  Disposition Plan: Transfer to floor tomorrow  Antimicrobials:   Doxycycline   Subjective: Breathing improving  Objective: Vitals:   07/28/16 1000 07/28/16 1100 07/28/16 1200 07/28/16 1428  BP: 108/68 (!) 115/55 (!) 116/52   Pulse: 69 64 71   Resp: 17 15 16      Temp:   97.8 F (36.6 C)   TempSrc:   Oral   SpO2: 99% 97% 96% 98%  Weight:      Height:        Intake/Output Summary (Last 24 hours) at 07/28/16 1559 Last data filed at 07/28/16 0500  Gross per 24 hour  Intake           376.67 ml  Output                0 ml  Net           376.67 ml   Filed Weights   07/27/16 0448 07/27/16 1130 07/28/16 0311  Weight: 37.3 kg (82 lb 4.8 oz) 37.3 kg (82 lb 4.8 oz) 37.3 kg (82 lb 3.7 oz)    Examination:  General exam: Appears calm and comfortable, no distress  Respiratory system: improved air movement Cardiovascular system: S1 & S2 heard, RRR. No JVD, murmurs Gastrointestinal system: Abdomen is nondistended, soft and nontender. Normal bowel sounds heard. Central nervous system: Alert and oriented. No focal neurological deficits. Extremities: Symmetric 5 x 5 power. Skin: No rashes, lesions or ulcers Psychiatry: Judgement and insight appear normal. Mood & affect appropriate.     Data Reviewed:   CBC:  Recent Labs Lab 07/26/16 1211 07/27/16 0530  WBC 15.1* 13.4*  NEUTROABS 11.9*  --   HGB 11.9* 10.9*  HCT 37.0 33.6*  MCV 94.9 93.6  PLT 228 219   Basic Metabolic Panel:  Recent Labs Lab 07/26/16 1211 07/27/16 0530  NA 131* 128*  K 4.9 4.6  CL 89* 86*  CO2 31 31  GLUCOSE 143* 188*  BUN 16 27*  CREATININE 0.81 1.16*  CALCIUM 9.6 9.0   GFR: Estimated Creatinine Clearance: 21.3 mL/min (A) (by C-G formula based on SCr of 1.16 mg/dL (H)). Liver Function Tests: No results for input(s): AST, ALT, ALKPHOS, BILITOT, PROT, ALBUMIN in the last 168 hours. No results for input(s): LIPASE, AMYLASE in the last 168 hours. No results for input(s): AMMONIA in the last 168 hours. Coagulation Profile: No results for input(s): INR, PROTIME in the last 168 hours. Cardiac Enzymes: No results for input(s): CKTOTAL, CKMB, CKMBINDEX, TROPONINI in the last 168 hours. BNP (last 3 results) No results for input(s): PROBNP in the last 8760  hours. HbA1C: No results for input(s): HGBA1C in the last 72 hours. CBG:  Recent Labs Lab 07/27/16 1308 07/27/16 1709 07/27/16 2120 07/28/16 0936 07/28/16 1353  GLUCAP 223* 149* 120* 174* 471*   Lipid Profile: No results for input(s): CHOL, HDL, LDLCALC, TRIG, CHOLHDL, LDLDIRECT in the last 72 hours. Thyroid Function Tests: No results for input(s): TSH, T4TOTAL, FREET4, T3FREE, THYROIDAB in the last 72 hours. Anemia Panel: No results for input(s): VITAMINB12, FOLATE, FERRITIN, TIBC, IRON, RETICCTPCT in the last 72 hours. Urine analysis:    Component Value Date/Time   COLORURINE YELLOW 04/23/2016 1755   APPEARANCEUR HAZY (A) 04/23/2016 1755   LABSPEC 1.011 04/23/2016 1755   PHURINE 5.0 04/23/2016 1755   GLUCOSEU NEGATIVE 04/23/2016 1755   GLUCOSEU NEGATIVE 05/13/2010 1224   HGBUR NEGATIVE 04/23/2016 1755   BILIRUBINUR NEGATIVE 04/23/2016 1755   KETONESUR NEGATIVE 04/23/2016 1755   PROTEINUR NEGATIVE 04/23/2016 1755   UROBILINOGEN 0.2 01/13/2015 1638   NITRITE NEGATIVE 04/23/2016 1755   LEUKOCYTESUR SMALL (A) 04/23/2016 1755   Sepsis Labs: @LABRCNTIP (procalcitonin:4,lacticidven:4)  ) Recent Results (from the past 240 hour(s))  MRSA PCR Screening     Status: None   Collection Time: 07/27/16 11:50 AM  Result Value Ref Range Status   MRSA by PCR NEGATIVE NEGATIVE Final    Comment:        The GeneXpert MRSA Assay (FDA approved for NASAL specimens only), is one component of a comprehensive MRSA colonization surveillance program. It is not intended to diagnose MRSA infection nor to guide or monitor treatment for MRSA infections.          Radiology Studies: Ct Angio Chest Pe W Or Wo Contrast  Result Date: 07/27/2016 CLINICAL DATA:  Hypoxia, shortness of breath EXAM: CT ANGIOGRAPHY CHEST WITH CONTRAST TECHNIQUE: Multidetector CT imaging of the chest was performed using the standard protocol during bolus administration of intravenous contrast. Multiplanar CT  image reconstructions and MIPs were obtained to evaluate the vascular anatomy. CONTRAST:  Seven 5 mL Isovue 370 COMPARISON:  None. FINDINGS: Cardiovascular: Satisfactory opacification of the pulmonary arteries to the segmental level. No evidence of pulmonary embolism. Normal heart size. No pericardial effusion. Thoracic aortic atherosclerosis. No thoracic aortic aneurysm. Mediastinum/Nodes: No enlarged mediastinal, hilar, or axillary lymph nodes. Thyroid gland, trachea, and esophagus demonstrate no significant findings. Lungs/Pleura: Lungs are clear. Bilateral centrilobular emphysema. No pneumothorax. Small left pleural effusion. Trace right pleural effusion. Upper Abdomen: No acute abdominal abnormality. Musculoskeletal: No acute osseous abnormality. No lytic or sclerotic osseous lesion. Review of the MIP images confirms the above findings. IMPRESSION: 1. No evidence of pulmonary embolus. 2.  Emphysema. (ICD10-J43.9) 3. Small left and trace right pleural effusion. Electronically Signed   By: Elige Ko   On: 07/27/2016  16:11        Scheduled Meds: . amLODipine  5 mg Oral Daily  . aspirin EC  81 mg Oral Daily  . atorvastatin  10 mg Oral q1800  . azithromycin  250 mg Oral Daily  . carvedilol  6.25 mg Oral BID WC  . digoxin  0.0625 mg Oral Daily  . enoxaparin (LOVENOX) injection  30 mg Subcutaneous Q24H  . feeding supplement (ENSURE ENLIVE)  237 mL Oral TID BM  . furosemide  40 mg Intravenous Daily  . insulin aspart  0-5 Units Subcutaneous QHS  . insulin aspart  0-9 Units Subcutaneous TID WC  . ipratropium-albuterol  3 mL Nebulization TID  . mouth rinse  15 mL Mouth Rinse BID  . methylPREDNISolone (SOLU-MEDROL) injection  80 mg Intravenous Q12H  . multivitamin with minerals  1 tablet Oral Daily   Continuous Infusions: . sodium chloride 10 mL/hr at 07/26/16 2011     LOS: 2 days    Time spent:    Zannie Cove, MD [facility] Pager [phone]  If 7PM-7AM, please  contact night-coverage [email or web address] Password [email or web address] 07/28/2016, 3:59 PM

## 2016-07-28 NOTE — Progress Notes (Signed)
Called DR Jomarie Longs with pt's 1715 CBG of 467 Orders received

## 2016-07-29 ENCOUNTER — Inpatient Hospital Stay (HOSPITAL_COMMUNITY): Payer: Medicare Other

## 2016-07-29 DIAGNOSIS — R0600 Dyspnea, unspecified: Secondary | ICD-10-CM

## 2016-07-29 LAB — ECHOCARDIOGRAM COMPLETE
AO mean calculated velocity dopler: 80.9 cm/s
AV Area VTI: 1.61 cm2
AV Area mean vel: 1.46 cm2
AV Mean grad: 3 mmHg
AV Peak grad: 6 mmHg
AV area mean vel ind: 1.09 cm2/m2
AV peak Index: 1.2
AVAREAVTIIND: 1.11 cm2/m2
AVCELMEANRAT: 0.57
AVPKVEL: 119 cm/s
Ao pk vel: 0.63 m/s
CHL CUP AV VEL: 1.49
CHL CUP DOP CALC LVOT VTI: 15.5 cm
E/e' ratio: 8.5
EWDT: 246 ms
FS: 24 % — AB (ref 28–44)
HEIGHTINCHES: 62 in
IV/PV OW: 0.92
LA ID, A-P, ES: 29 mm
LA diam end sys: 29 mm
LA diam index: 2.16 cm/m2
LA vol A4C: 19.3 ml
LA vol index: 21.6 mL/m2
LA vol: 28.9 mL
LDCA: 2.54 cm2
LV E/e'average: 8.5
LV TDI E'LATERAL: 7.01
LV e' LATERAL: 7.01 cm/s
LVEEMED: 8.5
LVOT SV: 39 mL
LVOT peak VTI: 0.59 cm
LVOTD: 18 mm
LVOTPV: 75.4 cm/s
MV Dec: 246
MV VTI: 202 cm
MVPKAVEL: 102 m/s
MVPKEVEL: 59.6 m/s
PW: 9.66 mm — AB (ref 0.6–1.1)
RV LATERAL S' VELOCITY: 8.38 cm/s
RV sys press: 23 mmHg
Reg peak vel: 222 cm/s
TAPSE: 14.6 mm
TDI e' medial: 4.33
TRMAXVEL: 222 cm/s
VTI: 26.4 cm
Valve area index: 1.11
Valve area: 1.49 cm2
Weight: 1392 oz

## 2016-07-29 LAB — BASIC METABOLIC PANEL
ANION GAP: 10 (ref 5–15)
BUN: 38 mg/dL — ABNORMAL HIGH (ref 6–20)
CALCIUM: 9.3 mg/dL (ref 8.9–10.3)
CO2: 33 mmol/L — ABNORMAL HIGH (ref 22–32)
Chloride: 88 mmol/L — ABNORMAL LOW (ref 101–111)
Creatinine, Ser: 0.81 mg/dL (ref 0.44–1.00)
Glucose, Bld: 69 mg/dL (ref 65–99)
Potassium: 4.2 mmol/L (ref 3.5–5.1)
Sodium: 131 mmol/L — ABNORMAL LOW (ref 135–145)

## 2016-07-29 LAB — CBC
HEMATOCRIT: 33 % — AB (ref 36.0–46.0)
Hemoglobin: 10.8 g/dL — ABNORMAL LOW (ref 12.0–15.0)
MCH: 29.8 pg (ref 26.0–34.0)
MCHC: 32.7 g/dL (ref 30.0–36.0)
MCV: 90.9 fL (ref 78.0–100.0)
Platelets: 255 10*3/uL (ref 150–400)
RBC: 3.63 MIL/uL — ABNORMAL LOW (ref 3.87–5.11)
RDW: 12.5 % (ref 11.5–15.5)
WBC: 13 10*3/uL — AB (ref 4.0–10.5)

## 2016-07-29 LAB — GLUCOSE, RANDOM: GLUCOSE: 36 mg/dL — AB (ref 65–99)

## 2016-07-29 LAB — GLUCOSE, CAPILLARY
GLUCOSE-CAPILLARY: 42 mg/dL — AB (ref 65–99)
GLUCOSE-CAPILLARY: 47 mg/dL — AB (ref 65–99)
GLUCOSE-CAPILLARY: 245 mg/dL — AB (ref 65–99)
Glucose-Capillary: 163 mg/dL — ABNORMAL HIGH (ref 65–99)
Glucose-Capillary: 262 mg/dL — ABNORMAL HIGH (ref 65–99)
Glucose-Capillary: 227 mg/dL — ABNORMAL HIGH (ref 65–99)

## 2016-07-29 MED ORDER — INSULIN GLARGINE 100 UNIT/ML ~~LOC~~ SOLN
10.0000 [IU] | Freq: Every day | SUBCUTANEOUS | Status: DC
  Administered 2016-07-29 – 2016-08-01 (×3): 10 [IU] via SUBCUTANEOUS
  Filled 2016-07-29 (×6): qty 0.1

## 2016-07-29 MED ORDER — FUROSEMIDE 40 MG PO TABS
40.0000 mg | ORAL_TABLET | Freq: Every day | ORAL | Status: DC
Start: 2016-07-29 — End: 2016-08-02
  Administered 2016-07-30 – 2016-08-02 (×4): 40 mg via ORAL
  Filled 2016-07-29 (×4): qty 1

## 2016-07-29 MED ORDER — METHYLPREDNISOLONE SODIUM SUCC 125 MG IJ SOLR
80.0000 mg | Freq: Once | INTRAMUSCULAR | Status: AC
  Administered 2016-07-29: 80 mg via INTRAVENOUS

## 2016-07-29 MED ORDER — IPRATROPIUM-ALBUTEROL 0.5-2.5 (3) MG/3ML IN SOLN
3.0000 mL | Freq: Two times a day (BID) | RESPIRATORY_TRACT | Status: DC
  Administered 2016-07-29 – 2016-08-02 (×8): 3 mL via RESPIRATORY_TRACT
  Filled 2016-07-29 (×8): qty 3

## 2016-07-29 MED ORDER — METHYLPREDNISOLONE SODIUM SUCC 125 MG IJ SOLR
60.0000 mg | Freq: Two times a day (BID) | INTRAMUSCULAR | Status: DC
  Administered 2016-07-29 – 2016-07-30 (×2): 60 mg via INTRAVENOUS
  Filled 2016-07-29 (×2): qty 2

## 2016-07-29 MED ORDER — DEXTROSE 50 % IV SOLN
INTRAVENOUS | Status: AC
  Administered 2016-07-29: 50 mL
  Filled 2016-07-29: qty 50

## 2016-07-29 NOTE — Care Management Important Message (Signed)
Important Message  Patient Details  Name: [name] MRN: [id number] Date of Birth: 1929-06-26   Medicare Important Message Given:  Yes    Karigan Cloninger Stefan Church 07/29/2016, 3:48 PM

## 2016-07-29 NOTE — Progress Notes (Signed)
Hypoglycemic Event  CBG: 42 at 0754 recheck at 0921 blood sugar 47. Order serum glucose  Treatment: D50 IV 50 mL  Symptoms: None  Follow-up CBG: Time:1008 CBG Result:163  Possible Reasons for Event: Unknown  Comments/MD notified:Dr Waneta Martins, Benjaman Pott

## 2016-07-29 NOTE — Progress Notes (Signed)
PROGRESS NOTE    [name]  ZOX:[id number] DOB: February 24, 1930 DOA: 07/26/2016 PCP: [name], MD  Brief Narrative: [name] is a 83 y.o. female with medical history significant for dyslipidemia, hypertension, DM (on Amaryl and metformin), COPD. She was recently seen by pulmonary (07/20/16) for ongoing shortness of breath for past few weeks or so and was given oxygen at home, Prednisone, Abx, started on 3 L [place] oxygen. Over past 24 hours her shortness of breath and cough productive of clear sputum.  Her 12 lead EKG showed atrial fibrillation on admission but her heart rate in now 81. CXR showed mild lingular atelectasis 4/4: noted to have more resp distress, hypoxia  Assessment & Plan:    Acute on chronic respiratory failure with hypoxia (HCC) /  -improving, off BIPAP -COPD with acute exacerbation -continue IV solumedrol-cut down dose, nebs, Abx-doxycycline -2D ECHO today, change lasix to PO -transfer to floor today    ACute on Chronic diastolic heart failure (HCC)  - 2 D EHCO in 2012 showed normal EF and grade 1 DD - Continue digoxin, euvolemic, change lasix to PO    PACs -on Admission EKG-not afib, p waves noted, monitor, in NSR now    Essential hypertension - Resumed home meds: Norvasc, hold benazepril     Dyslipidemia associated with type 2 diabetes mellitus (HCC) - Continue Lipitor    Diabetes mellitus without complication, without long-term current use of insulin (HCC) - hold Amaryl and metformin  -SSI for now  Severe malnutrition - Nutrition consulted - Body mass index is 17.51 kg/m.   DVT prophylaxis: Lovenox subQ Code Status: full code Family Communication: daughter at bedside  Disposition Plan: Transfer to floor today  Antimicrobials:   Doxycycline   Subjective: Breathing improving, not at base  Objective: Vitals:   07/29/16 0600 07/29/16 0757 07/29/16 0831 07/29/16 1210  BP: (!) 126/50 126/62  134/64  Pulse: (!) 54 60  67  Resp:  12 15  16   Temp:  97.7 F (36.5 C)  97.6 F (36.4 C)  TempSrc:  Oral  Oral  SpO2: 100% 98% 100% 98%  Weight: 39.5 kg (87 lb)     Height:        Intake/Output Summary (Last 24 hours) at 07/29/16 1459 Last data filed at 07/29/16 1210  Gross per 24 hour  Intake              150 ml  Output              550 ml  Net             -400 ml   Filed Weights   07/27/16 1130 07/28/16 0311 07/29/16 0600  Weight: 37.3 kg (82 lb 4.8 oz) 37.3 kg (82 lb 3.7 oz) 39.5 kg (87 lb)    Examination:  General exam: Appears calm and comfortable, no distress  Respiratory system: improved air movement Cardiovascular system: S1 & S2 heard, RRR. No JVD, murmurs Gastrointestinal system: Abdomen is nondistended, soft and nontender. Normal bowel sounds heard. Central nervous system: Alert and oriented. No focal neurological deficits. Extremities: Symmetric 5 x 5 power. Skin: No rashes, lesions or ulcers Psychiatry: Judgement and insight appear normal. Mood & affect appropriate.     Data Reviewed:   CBC:  Recent Labs Lab 07/26/16 1211 07/27/16 0530 07/29/16 0343  WBC 15.1* 13.4* 13.0*  NEUTROABS 11.9*  --   --   HGB 11.9* 10.9* 10.8*  HCT 37.0 33.6* 33.0*  MCV 94.9 93.6 90.9  PLT 228 219 255   Basic Metabolic Panel:  Recent Labs Lab 07/26/16 1211 07/27/16 0530 07/29/16 0343 07/29/16 0847  NA 131* 128* 131*  --   K 4.9 4.6 4.2  --   CL 89* 86* 88*  --   CO2 31 31 33*  --   GLUCOSE 143* 188* 69 36*  BUN 16 27* 38*  --   CREATININE 0.81 1.16* 0.81  --   CALCIUM 9.6 9.0 9.3  --    GFR: Estimated Creatinine Clearance: 32.2 mL/min (by C-G formula based on SCr of 0.81 mg/dL). Liver Function Tests: No results for input(s): AST, ALT, ALKPHOS, BILITOT, PROT, ALBUMIN in the last 168 hours. No results for input(s): LIPASE, AMYLASE in the last 168 hours. No results for input(s): AMMONIA in the last 168 hours. Coagulation Profile: No results for input(s): INR, PROTIME in the last 168  hours. Cardiac Enzymes: No results for input(s): CKTOTAL, CKMB, CKMBINDEX, TROPONINI in the last 168 hours. BNP (last 3 results) No results for input(s): PROBNP in the last 8760 hours. HbA1C: No results for input(s): HGBA1C in the last 72 hours. CBG:  Recent Labs Lab 07/28/16 2130 07/29/16 0754 07/29/16 0921 07/29/16 1008 07/29/16 1209  GLUCAP 244* 42* 47* 163* 245*   Lipid Profile: No results for input(s): CHOL, HDL, LDLCALC, TRIG, CHOLHDL, LDLDIRECT in the last 72 hours. Thyroid Function Tests: No results for input(s): TSH, T4TOTAL, FREET4, T3FREE, THYROIDAB in the last 72 hours. Anemia Panel: No results for input(s): VITAMINB12, FOLATE, FERRITIN, TIBC, IRON, RETICCTPCT in the last 72 hours. Urine analysis:    Component Value Date/Time   COLORURINE YELLOW 04/23/2016 1755   APPEARANCEUR HAZY (A) 04/23/2016 1755   LABSPEC 1.011 04/23/2016 1755   PHURINE 5.0 04/23/2016 1755   GLUCOSEU NEGATIVE 04/23/2016 1755   GLUCOSEU NEGATIVE 05/13/2010 1224   HGBUR NEGATIVE 04/23/2016 1755   BILIRUBINUR NEGATIVE 04/23/2016 1755   KETONESUR NEGATIVE 04/23/2016 1755   PROTEINUR NEGATIVE 04/23/2016 1755   UROBILINOGEN 0.2 01/13/2015 1638   NITRITE NEGATIVE 04/23/2016 1755   LEUKOCYTESUR SMALL (A) 04/23/2016 1755   Sepsis Labs: @LABRCNTIP (procalcitonin:4,lacticidven:4)  ) Recent Results (from the past 240 hour(s))  MRSA PCR Screening     Status: None   Collection Time: 07/27/16 11:50 AM  Result Value Ref Range Status   MRSA by PCR NEGATIVE NEGATIVE Final    Comment:        The GeneXpert MRSA Assay (FDA approved for NASAL specimens only), is one component of a comprehensive MRSA colonization surveillance program. It is not intended to diagnose MRSA infection nor to guide or monitor treatment for MRSA infections.          Radiology Studies: Ct Angio Chest Pe W Or Wo Contrast  Result Date: 07/27/2016 CLINICAL DATA:  Hypoxia, shortness of breath EXAM: CT ANGIOGRAPHY  CHEST WITH CONTRAST TECHNIQUE: Multidetector CT imaging of the chest was performed using the standard protocol during bolus administration of intravenous contrast. Multiplanar CT image reconstructions and MIPs were obtained to evaluate the vascular anatomy. CONTRAST:  Seven 5 mL Isovue 370 COMPARISON:  None. FINDINGS: Cardiovascular: Satisfactory opacification of the pulmonary arteries to the segmental level. No evidence of pulmonary embolism. Normal heart size. No pericardial effusion. Thoracic aortic atherosclerosis. No thoracic aortic aneurysm. Mediastinum/Nodes: No enlarged mediastinal, hilar, or axillary lymph nodes. Thyroid gland, trachea, and esophagus demonstrate no significant findings. Lungs/Pleura: Lungs are clear. Bilateral centrilobular emphysema. No pneumothorax. Small left pleural effusion. Trace right pleural effusion. Upper Abdomen: No acute abdominal abnormality. Musculoskeletal:  No acute osseous abnormality. No lytic or sclerotic osseous lesion. Review of the MIP images confirms the above findings. IMPRESSION: 1. No evidence of pulmonary embolus. 2.  Emphysema. (ICD10-J43.9) 3. Small left and trace right pleural effusion. Electronically Signed   By: Elige Ko   On: 07/27/2016 16:11        Scheduled Meds: . amLODipine  5 mg Oral Daily  . aspirin EC  81 mg Oral Daily  . atorvastatin  10 mg Oral q1800  . azithromycin  250 mg Oral Daily  . carvedilol  6.25 mg Oral BID WC  . digoxin  0.0625 mg Oral Daily  . enoxaparin (LOVENOX) injection  30 mg Subcutaneous Q24H  . feeding supplement (GLUCERNA SHAKE)  237 mL Oral BID BM  . furosemide  40 mg Oral Daily  . insulin aspart  0-20 Units Subcutaneous TID WC  . insulin aspart  0-5 Units Subcutaneous QHS  . insulin glargine  10 Units Subcutaneous QHS  . ipratropium-albuterol  3 mL Nebulization BID  . mouth rinse  15 mL Mouth Rinse BID  . methylPREDNISolone (SOLU-MEDROL) injection  60 mg Intravenous Q12H  . multivitamin with minerals  1  tablet Oral Daily   Continuous Infusions: . sodium chloride Stopped (07/28/16 0700)     LOS: 3 days    Time spent:    Zannie Cove, MD [facility] Pager [phone]  If 7PM-7AM, please contact night-coverage [email or web address] Password [email or web address] 07/29/2016, 2:59 PM

## 2016-07-29 NOTE — Progress Notes (Signed)
07/29/2016  Central monitor called patient had 4 beats runs vtach. Rn went to assess patient she was asleep. Dr Jomarie Longs was made aware. [place] RN.

## 2016-07-29 NOTE — Progress Notes (Signed)
Received form 4E, report received form Gloriajean Dell, Charity fundraiser.Agree with previous RN's assessment.

## 2016-07-29 NOTE — Progress Notes (Signed)
CRITICAL VALUE ALERT  Critical value received:  Serum Glucose (36)  Date of notification:  07/29/2016  Time of notification:  0955  Critical value read back:Yes.    Nurse who received alert:  Lovie Macadamia RN  MD notified (1st page):  Dr Jomarie Longs  Time of first page:  1015  MD notified (2nd page):  Time of second page:  Responding MD:  Dr Jomarie Longs   Time MD responded:  1020

## 2016-07-30 LAB — CBC
HCT: 32.9 % — ABNORMAL LOW (ref 36.0–46.0)
Hemoglobin: 10.6 g/dL — ABNORMAL LOW (ref 12.0–15.0)
MCH: 29.7 pg (ref 26.0–34.0)
MCHC: 32.2 g/dL (ref 30.0–36.0)
MCV: 92.2 fL (ref 78.0–100.0)
Platelets: 263 10*3/uL (ref 150–400)
RBC: 3.57 MIL/uL — ABNORMAL LOW (ref 3.87–5.11)
RDW: 12.6 % (ref 11.5–15.5)
WBC: 12 10*3/uL — ABNORMAL HIGH (ref 4.0–10.5)

## 2016-07-30 LAB — GLUCOSE, CAPILLARY
GLUCOSE-CAPILLARY: 221 mg/dL — AB (ref 65–99)
Glucose-Capillary: 224 mg/dL — ABNORMAL HIGH (ref 65–99)
Glucose-Capillary: 366 mg/dL — ABNORMAL HIGH (ref 65–99)
Glucose-Capillary: 269 mg/dL — ABNORMAL HIGH (ref 65–99)

## 2016-07-30 LAB — BASIC METABOLIC PANEL
Anion gap: 9 (ref 5–15)
BUN: 32 mg/dL — ABNORMAL HIGH (ref 6–20)
CALCIUM: 9.3 mg/dL (ref 8.9–10.3)
CO2: 36 mmol/L — ABNORMAL HIGH (ref 22–32)
CREATININE: 0.77 mg/dL (ref 0.44–1.00)
Chloride: 87 mmol/L — ABNORMAL LOW (ref 101–111)
GFR calc Af Amer: >60 mL/min (ref >60)
GLUCOSE: 269 mg/dL — AB (ref 65–99)
Potassium: 4.3 mmol/L (ref 3.5–5.1)
Sodium: 132 mmol/L — ABNORMAL LOW (ref 135–145)

## 2016-07-30 MED ORDER — METHYLPREDNISOLONE SODIUM SUCC 40 MG IJ SOLR
40.0000 mg | Freq: Two times a day (BID) | INTRAMUSCULAR | Status: DC
Start: 2016-07-30 — End: 2016-07-31
  Administered 2016-07-30 – 2016-07-31 (×2): 40 mg via INTRAVENOUS
  Filled 2016-07-30 (×2): qty 1

## 2016-07-30 NOTE — Evaluation (Signed)
Occupational Therapy Evaluation Patient Details Name: OLINDA DIANE MRN: [id number] DOB: 09/13/1929 Today's Date: 07/30/2016    History of Present Illness 83 y.o. female with medical history significant for dyslipidemia, hypertension, DM (on Amaryl and metformin), COPD. She was recently seen by pulmonary (07/20/16) for ongoing shortness of breath for past few weeks    Clinical Impression   This 83 yo female admitted with above presents to acute OT with deficits below (see OT problem list) thus affecting her PLOF of being independent with basic ADLs up until 2-3 weeks ago. She will benefit from acute OT with follow up OT at SNF to get back to PLOF.    Follow Up Recommendations  SNF;Supervision/Assistance - 24 hour    Equipment Recommendations  Other (comment) (TBD at next venue)       Precautions / Restrictions Precautions Precautions: Fall Precaution Comments: watch o2 saturations Restrictions Weight Bearing Restrictions: No      Mobility Bed Mobility Overal bed mobility: Needs Assistance Bed Mobility: Rolling;Supine to Sit Rolling: Supervision   Supine to sit: Min assist     General bed mobility comments: min assist to come to EOB, increased time and use of bedrail noted  Transfers Overall transfer level: Needs assistance Equipment used: 1 person hand held assist Transfers: Sit to/from Stand Sit to Stand: Min assist         General transfer comment: min assist for stability when coming to upright. increased sway noted, min assist to power up from toilet using grab bar    Balance Overall balance assessment: Needs assistance Sitting-balance support: No upper extremity supported;Feet supported Sitting balance-Leahy Scale: Fair Sitting balance - Comments: able to sit EOB    Standing balance support: Bilateral upper extremity supported;During functional activity Standing balance-Leahy Scale: Poor Standing balance comment: posterior list with increased sway and  posterior LOB noted in static standing requiring physical assist                           ADL either performed or assessed with clinical judgement   ADL Overall ADL's : Needs assistance/impaired Eating/Feeding: Independent;Sitting   Grooming: Set up;Supervision/safety;Sitting   Upper Body Bathing: Set up;Supervision/ safety;Sitting   Lower Body Bathing: Minimal assistance;Sit to/from stand   Upper Body Dressing : Set up;Supervision/safety;Sitting   Lower Body Dressing: Minimal assistance;Sit to/from stand   Toilet Transfer: Minimal assistance;Ambulation;Regular Toilet;Grab bars   Toileting- Clothing Manipulation and Hygiene: Minimal assistance;Sit to/from stand               Vision Patient Visual Report: No change from baseline              Pertinent Vitals/Pain Pain Assessment: No/denies pain     Hand Dominance Right   Extremity/Trunk Assessment Upper Extremity Assessment Upper Extremity Assessment: Overall WFL for tasks assessed;Generalized weakness   Lower Extremity Assessment Lower Extremity Assessment: Generalized weakness       Communication Communication Communication: HOH   Cognition Arousal/Alertness: Awake/alert Behavior During Therapy: WFL for tasks assessed/performed Overall Cognitive Status: History of cognitive impairments - at baseline Area of Impairment: Attention;Following commands;Safety/judgement;Awareness;Problem solving                   Current Attention Level: Selective   Following Commands: Follows one step commands consistently;Follows multi-step commands with increased time Safety/Judgement: Decreased awareness of safety Awareness: Emergent Problem Solving: Requires verbal cues;Requires tactile cues  Home Living Family/patient expects to be discharged to:: Private residence Living Arrangements: Children Available Help at Discharge: Family Type of Home: House Home Access: Stairs to  enter Secretary/administrator of Steps: 1 Entrance Stairs-Rails: None Home Layout: Two level Alternate Level Stairs-Number of Steps: 10 Alternate Level Stairs-Rails: Right Bathroom Shower/Tub: Chief Strategy Officer: Standard     Home Equipment: Environmental consultant - 2 wheels          Prior Functioning/Environment Level of Independence: Needs assistance  Gait / Transfers Assistance Needed: has assist for stair negotaition ADL's / Homemaking Assistance Needed: assist for getting into and out of the tub, assist for bathing and dressing the past few weeks            OT Problem List: Decreased strength;Impaired balance (sitting and/or standing)      OT Treatment/Interventions: Self-care/ADL training;Therapeutic activities;Patient/family education;DME and/or AE instruction;Balance training    OT Goals(Current goals can be found in the care plan section) Acute Rehab OT Goals Patient Stated Goal: to get better OT Goal Formulation: With patient/family Time For Goal Achievement: 08/13/16 Potential to Achieve Goals: Good  OT Frequency: Min 2X/week           Co-evaluation PT/OT/SLP Co-Evaluation/Treatment: Yes Reason for Co-Treatment: To address functional/ADL transfers   OT goals addressed during session: ADL's and self-care;Strengthening/ROM      End of Session Equipment Utilized During Treatment: Gait belt;Rolling walker Nurse Communication: Mobility status  Activity Tolerance: Patient tolerated treatment well Patient left: in chair;with call bell/phone within reach;with chair alarm set;with family/visitor present  OT Visit Diagnosis: Unsteadiness on feet (R26.81);Muscle weakness (generalized) (M62.81);History of falling (Z91.81)                Time: 1610-9604 OT Time Calculation (min): 31 min Charges:  OT General Charges $OT Visit: 1 Procedure OT Evaluation $OT Eval Moderate Complexity: 1 Procedure Ignacia Palma, OTR/L [phone] 07/30/2016

## 2016-07-30 NOTE — Evaluation (Signed)
Physical Therapy Evaluation Patient Details Name: [name] MRN: [id number] DOB: Sep 12, 1929 Today's Date: 07/30/2016   History of Present Illness  83 y.o. female with medical history significant for dyslipidemia, hypertension, DM (on Amaryl and metformin), COPD. She was recently seen by pulmonary (07/20/16) for ongoing shortness of breath for past few weeks   Clinical Impression  Patient demonstrates deficits in functional mobility as indicated below. Will need continued skilled PT to address deficits and maximize function. Will see as indicated and progress as tolerated. Will need SNF upon acute discharge.  Ambulated on 3 liters with stable saturations.     Follow Up Recommendations SNF;Supervision for mobility/OOB    Equipment Recommendations   (rollator walker)    Recommendations for Other Services       Precautions / Restrictions Precautions Precautions: Fall Precaution Comments: watch o2 saturations Restrictions Weight Bearing Restrictions: No      Mobility  Bed Mobility Overal bed mobility: Needs Assistance Bed Mobility: Rolling;Supine to Sit Rolling: Supervision   Supine to sit: Min assist     General bed mobility comments: min assist to come to EOB, increased time and use of bedrail noted  Transfers Overall transfer level: Needs assistance Equipment used: 1 person hand held assist Transfers: Sit to/from Stand Sit to Stand: Min assist         General transfer comment: min assist for stability when coming to upright. increased sway noted, min assist to power up from toilet using grab bar  Ambulation/Gait Ambulation/Gait assistance: Min assist (2 episodes of LOB requiring moderate assist to correct) Ambulation Distance (Feet): 90 Feet (x2 with standing rest break) Assistive device: Rolling walker (2 wheeled) Gait Pattern/deviations: Step-through pattern;Decreased stride length;Shuffle;Leaning posteriorly;Drifts right/left;Narrow base of support Gait  velocity: decreased Gait velocity interpretation: Below normal speed for age/gender General Gait Details: patient with noted instability reqiring hands on assist at all times, increased physical assist for multiple LOB during ambulation. Manual assist for control of RW as patient continuously running into objects in hall  Stairs            Wheelchair Mobility    Modified Rankin (Stroke Patients Only)       Balance Overall balance assessment: Needs assistance   Sitting balance-Leahy Scale: Fair Sitting balance - Comments: able to sit EOB self supported   Standing balance support: Bilateral upper extremity supported Standing balance-Leahy Scale: Poor Standing balance comment: posterior list with increased sway and posterior LOB noted in static standing requiring physical assist                             Pertinent Vitals/Pain      Home Living Family/patient expects to be discharged to:: Private residence Living Arrangements: Children Available Help at Discharge: Family Type of Home: House Home Access: Stairs to enter Entrance Stairs-Rails: None Entrance Stairs-Number of Steps: 1 Home Layout: Two level Home Equipment: Environmental consultant - 2 wheels      Prior Function Level of Independence: Needs assistance   Gait / Transfers Assistance Needed: has assist for stair negotaition  ADL's / Homemaking Assistance Needed: assist for getting into and out of the tub, assist for bathing and dressing the past few weeks        Hand Dominance   Dominant Hand: Right    Extremity/Trunk Assessment   Upper Extremity Assessment Upper Extremity Assessment: Generalized weakness    Lower Extremity Assessment Lower Extremity Assessment: Generalized weakness    Cervical / Trunk  Assessment Cervical / Trunk Assessment: Kyphotic  Communication   Communication: HOH  Cognition Arousal/Alertness: Awake/alert Behavior During Therapy: WFL for tasks assessed/performed Overall  Cognitive Status: History of cognitive impairments - at baseline Area of Impairment: Attention;Following commands;Safety/judgement;Awareness;Problem solving                   Current Attention Level: Selective   Following Commands: Follows one step commands consistently;Follows multi-step commands with increased time Safety/Judgement: Decreased awareness of safety Awareness: Emergent Problem Solving: Requires verbal cues;Requires tactile cues        General Comments      Exercises     Assessment/Plan    PT Assessment Patient needs continued PT services  PT Problem List Decreased strength;Decreased activity tolerance;Decreased balance;Decreased mobility;Decreased coordination;Decreased cognition;Decreased safety awareness;Cardiopulmonary status limiting activity       PT Treatment Interventions DME instruction;Gait training;Stair training;Functional mobility training;Therapeutic activities;Therapeutic exercise;Balance training;Cognitive remediation;Patient/family education    PT Goals (Current goals can be found in the Care Plan section)  Acute Rehab PT Goals Patient Stated Goal: to get better PT Goal Formulation: With patient/family Time For Goal Achievement: 08/13/16 Potential to Achieve Goals: Good    Frequency Min 2X/week   Barriers to discharge Inaccessible home environment      Co-evaluation               End of Session Equipment Utilized During Treatment: Gait belt;Oxygen Activity Tolerance: Patient tolerated treatment well Patient left: in chair;with call bell/phone within reach;with chair alarm set;with family/visitor present Nurse Communication: Mobility status PT Visit Diagnosis: Unsteadiness on feet (R26.81);Muscle weakness (generalized) (M62.81)    Time: 2130-8657 PT Time Calculation (min) (ACUTE ONLY): 31 min   Charges:   PT Evaluation $PT Eval Moderate Complexity: 1 Procedure     PT G Codes:        Charlotte Crumb, PT DPT   [phone]   Fabio Asa 07/30/2016, 10:40 AM

## 2016-07-30 NOTE — Progress Notes (Signed)
PROGRESS NOTE    [name]  TZG:[id number] DOB: 1929/07/04 DOA: 07/26/2016 PCP: [name], MD  Brief Narrative: [name] is a 83 y.o. female with medical history significant for dyslipidemia, hypertension, DM (on Amaryl and metformin), COPD. She was recently seen by pulmonary (07/20/16) for ongoing shortness of breath for past few weeks or so and was given oxygen at home, Prednisone, Abx, started on 3 L [place] oxygen. Over past 24 hours her shortness of breath and cough productive of clear sputum.  Her 12 lead EKG showed atrial fibrillation on admission but her heart rate in now 81. CXR showed mild lingular atelectasis 4/4: noted to have more resp distress, hypoxia  Assessment & Plan:    Acute on chronic respiratory failure with hypoxia (HCC) /  -improving, off BIPAP -COPD with acute exacerbation -continue IV solumedrol-cut down dose, nebs, Abx-doxycycline -2D ECHO with EF 45%,  -continue PO lasix, ambulate, OOB, PT consult    ACute on Chronic diastolic heart failure (HCC)  - 2 D EHCO in 2012 showed EF 55% and grade 1 DD -repeat ECHO with EF 45%, Continue digoxin, euvolemic,  -now on PO lasix 40mg  at discharge instead of 20mg , I do not think she needs an ischemia eval, will ask her to FU with Cards    PACs -on Admission EKG-not afib, p waves noted, monitor, in NSR now    Essential hypertension - Resumed home meds: Norvasc, hold benazepril     Dyslipidemia associated with type 2 diabetes mellitus (HCC) - Continue Lipitor    Diabetes mellitus without complication, without long-term current use of insulin (HCC) - hold Amaryl and metformin  - SSI for now  Severe malnutrition - Nutrition consulted - Body mass index is 17.51 kg/m, encouraged supplements  DVT prophylaxis: Lovenox subQ Code Status: full code Family Communication: daughter at bedside  Disposition Plan: home in 1-2days  Antimicrobials:   Doxycycline   Subjective: Breathing improving, not at  baseline, some cough too  Objective: Vitals:   07/30/16 0739 07/30/16 0912 07/30/16 1038 07/30/16 1111  BP: 122/62  138/66 137/64  Pulse: (!) 59  77 64  Resp:      Temp: 97.7 F (36.5 C)     TempSrc: Oral     SpO2: 100% 97%    Weight:      Height:        Intake/Output Summary (Last 24 hours) at 07/30/16 1134 Last data filed at 07/30/16 0900  Gross per 24 hour  Intake              210 ml  Output              900 ml  Net             -690 ml   Filed Weights   07/28/16 0311 07/29/16 0600 07/30/16 0436  Weight: 37.3 kg (82 lb 3.7 oz) 39.5 kg (87 lb) 36.9 kg (81 lb 6.4 oz)    Examination:  General exam: Appears calm and comfortable, no distress  Respiratory system: improved air movement, no wheezes today Cardiovascular system: S1 & S2 heard, RRR. No JVD, murmurs Gastrointestinal system: Abdomen is nondistended, soft and nontender. Normal bowel sounds heard. Central nervous system: Alert and oriented. No focal neurological deficits. Extremities: Symmetric 5 x 5 power. Skin: No rashes, lesions or ulcers Psychiatry: Judgement and insight appear normal. Mood & affect appropriate.     Data Reviewed:   CBC:  Recent Labs Lab 07/26/16 1211 07/27/16 0530 07/29/16 7591  07/30/16 0408  WBC 15.1* 13.4* 13.0* 12.0*  NEUTROABS 11.9*  --   --   --   HGB 11.9* 10.9* 10.8* 10.6*  HCT 37.0 33.6* 33.0* 32.9*  MCV 94.9 93.6 90.9 92.2  PLT 228 219 255 263   Basic Metabolic Panel:  Recent Labs Lab 07/26/16 1211 07/27/16 0530 07/29/16 0343 07/29/16 0847 07/30/16 0408  NA 131* 128* 131*  --  132*  K 4.9 4.6 4.2  --  4.3  CL 89* 86* 88*  --  87*  CO2 31 31 33*  --  36*  GLUCOSE 143* 188* 69 36* 269*  BUN 16 27* 38*  --  32*  CREATININE 0.81 1.16* 0.81  --  0.77  CALCIUM 9.6 9.0 9.3  --  9.3   GFR: Estimated Creatinine Clearance: 30.5 mL/min (by C-G formula based on SCr of 0.77 mg/dL). Liver Function Tests: No results for input(s): AST, ALT, ALKPHOS, BILITOT, PROT,  ALBUMIN in the last 168 hours. No results for input(s): LIPASE, AMYLASE in the last 168 hours. No results for input(s): AMMONIA in the last 168 hours. Coagulation Profile: No results for input(s): INR, PROTIME in the last 168 hours. Cardiac Enzymes: No results for input(s): CKTOTAL, CKMB, CKMBINDEX, TROPONINI in the last 168 hours. BNP (last 3 results) No results for input(s): PROBNP in the last 8760 hours. HbA1C: No results for input(s): HGBA1C in the last 72 hours. CBG:  Recent Labs Lab 07/29/16 1209 07/29/16 1740 07/29/16 2050 07/30/16 0728 07/30/16 1125  GLUCAP 245* 262* 227* 224* 221*   Lipid Profile: No results for input(s): CHOL, HDL, LDLCALC, TRIG, CHOLHDL, LDLDIRECT in the last 72 hours. Thyroid Function Tests: No results for input(s): TSH, T4TOTAL, FREET4, T3FREE, THYROIDAB in the last 72 hours. Anemia Panel: No results for input(s): VITAMINB12, FOLATE, FERRITIN, TIBC, IRON, RETICCTPCT in the last 72 hours. Urine analysis:    Component Value Date/Time   COLORURINE YELLOW 04/23/2016 1755   APPEARANCEUR HAZY (A) 04/23/2016 1755   LABSPEC 1.011 04/23/2016 1755   PHURINE 5.0 04/23/2016 1755   GLUCOSEU NEGATIVE 04/23/2016 1755   GLUCOSEU NEGATIVE 05/13/2010 1224   HGBUR NEGATIVE 04/23/2016 1755   BILIRUBINUR NEGATIVE 04/23/2016 1755   KETONESUR NEGATIVE 04/23/2016 1755   PROTEINUR NEGATIVE 04/23/2016 1755   UROBILINOGEN 0.2 01/13/2015 1638   NITRITE NEGATIVE 04/23/2016 1755   LEUKOCYTESUR SMALL (A) 04/23/2016 1755   Sepsis Labs: @LABRCNTIP (procalcitonin:4,lacticidven:4)  ) Recent Results (from the past 240 hour(s))  MRSA PCR Screening     Status: None   Collection Time: 07/27/16 11:50 AM  Result Value Ref Range Status   MRSA by PCR NEGATIVE NEGATIVE Final    Comment:        The GeneXpert MRSA Assay (FDA approved for NASAL specimens only), is one component of a comprehensive MRSA colonization surveillance program. It is not intended to diagnose  MRSA infection nor to guide or monitor treatment for MRSA infections.          Radiology Studies: No results found.      Scheduled Meds: . amLODipine  5 mg Oral Daily  . aspirin EC  81 mg Oral Daily  . atorvastatin  10 mg Oral q1800  . azithromycin  250 mg Oral Daily  . carvedilol  6.25 mg Oral BID WC  . digoxin  0.0625 mg Oral Daily  . enoxaparin (LOVENOX) injection  30 mg Subcutaneous Q24H  . feeding supplement (GLUCERNA SHAKE)  237 mL Oral BID BM  . furosemide  40 mg Oral Daily  .  insulin aspart  0-20 Units Subcutaneous TID WC  . insulin aspart  0-5 Units Subcutaneous QHS  . insulin glargine  10 Units Subcutaneous QHS  . ipratropium-albuterol  3 mL Nebulization BID  . mouth rinse  15 mL Mouth Rinse BID  . methylPREDNISolone (SOLU-MEDROL) injection  40 mg Intravenous Q12H  . multivitamin with minerals  1 tablet Oral Daily   Continuous Infusions: . sodium chloride Stopped (07/28/16 0700)     LOS: 4 days    Time spent:    Zannie Cove, MD [facility] Pager [phone]  If 7PM-7AM, please contact night-coverage [email or web address] Password [facility] 07/30/2016, 11:34 AM

## 2016-07-31 LAB — GLUCOSE, CAPILLARY
GLUCOSE-CAPILLARY: 136 mg/dL — AB (ref 65–99)
GLUCOSE-CAPILLARY: 176 mg/dL — AB (ref 65–99)
Glucose-Capillary: 248 mg/dL — ABNORMAL HIGH (ref 65–99)
Glucose-Capillary: 369 mg/dL — ABNORMAL HIGH (ref 65–99)

## 2016-07-31 MED ORDER — PREDNISONE 10 MG PO TABS: 10.0000 mg | ORAL_TABLET | Freq: Every day | ORAL | Status: DC

## 2016-07-31 MED ORDER — PREDNISONE 20 MG PO TABS
50.0000 mg | ORAL_TABLET | Freq: Every day | ORAL | Status: DC
  Filled 2016-07-31: qty 2

## 2016-07-31 MED ORDER — METHYLPREDNISOLONE SODIUM SUCC 40 MG IJ SOLR
40.0000 mg | Freq: Two times a day (BID) | INTRAMUSCULAR | Status: AC
  Administered 2016-07-31: 40 mg via INTRAVENOUS
  Filled 2016-07-31: qty 1

## 2016-07-31 NOTE — Progress Notes (Signed)
PROGRESS NOTE    MARIJEAN Hartman  ZOX:[id number] DOB: 19-Aug-1929 DOA: 07/26/2016 PCP: [name], MD  Brief Narrative: [name] is a 83 y.o. female with medical history significant for dyslipidemia, hypertension, DM (on Amaryl and metformin), COPD. She was recently seen by pulmonary (07/20/16) for ongoing shortness of breath for past few weeks or so and was given oxygen at home, Prednisone, Abx, started on 3 L [place] oxygen. Over past 24 hours her shortness of breath and cough productive of clear sputum.  Her 12 lead EKG showed atrial fibrillation on admission but her heart rate in now 81. CXR showed mild lingular atelectasis 4/4: noted to have more resp distress, hypoxia  Assessment & Plan:    Acute on chronic respiratory failure with hypoxia (HCC) /  -improving, off BIPAP -COPD with acute exacerbation -cut down IV solumedrol, change to prednisone, nebs, Abx-doxycycline -2D ECHO with EF 45%,  -continue PO lasix, ambulate, OOB, PT consult -plan for SNF at discharge    ACute on Chronic diastolic heart failure (HCC)  - 2 D EHCO in 2012 showed EF 55% and grade 1 DD -repeat ECHO with EF 45%, Continue digoxin, euvolemic,  -now on PO lasix 40mg  at discharge instead of 20mg , I do not think she needs an ischemia eval, will ask her to FU with Cards    PACs -on Admission EKG-not afib, p waves noted, monitor, in NSR now    Essential hypertension - Resumed home meds: Norvasc, hold benazepril     Dyslipidemia associated with type 2 diabetes mellitus (HCC) - Continue Lipitor    Diabetes mellitus without complication, without long-term current use of insulin (HCC) - hold Amaryl and metformin  - SSI for now  Severe malnutrition - Nutrition consulted - Body mass index is 17.51 kg/m, encouraged supplements  DVT prophylaxis: Lovenox subQ Code Status: full code Family Communication: daughter at bedside  Disposition Plan: SNF tomorrow if bed available  Antimicrobials:    Doxycycline   Subjective: Breathing improving, not at baseline, some productive cough  Objective: Vitals:   07/30/16 2049 07/31/16 0309 07/31/16 0334 07/31/16 1032  BP: (!) 140/56  (!) 121/54 (!) 144/54  Pulse: 72  76 67  Resp: 16  15   Temp: 98.7 F (37.1 C)  97.7 F (36.5 C) 97.3 F (36.3 C)  TempSrc:    Oral  SpO2: 97%  99% 94%  Weight:  36.8 kg (81 lb 3.2 oz)    Height:        Intake/Output Summary (Last 24 hours) at 07/31/16 1308 Last data filed at 07/31/16 0600  Gross per 24 hour  Intake              120 ml  Output              400 ml  Net             -280 ml   Filed Weights   07/29/16 0600 07/30/16 0436 07/31/16 0309  Weight: 39.5 kg (87 lb) 36.9 kg (81 lb 6.4 oz) 36.8 kg (81 lb 3.2 oz)    Examination:  General exam: Appears calm and comfortable, no distress  Respiratory system: improved air movement, no wheezes today Cardiovascular system: S1 & S2 heard, RRR. No JVD, murmurs Gastrointestinal system: Abdomen is nondistended, soft and nontender. Normal bowel sounds heard. Central nervous system: Alert and oriented. No focal neurological deficits. Extremities: Symmetric 5 x 5 power. Skin: No rashes, lesions or ulcers Psychiatry: Judgement and insight appear normal. Mood &  affect appropriate.     Data Reviewed:   CBC:  Recent Labs Lab 07/26/16 1211 07/27/16 0530 07/29/16 0343 07/30/16 0408  WBC 15.1* 13.4* 13.0* 12.0*  NEUTROABS 11.9*  --   --   --   HGB 11.9* 10.9* 10.8* 10.6*  HCT 37.0 33.6* 33.0* 32.9*  MCV 94.9 93.6 90.9 92.2  PLT 228 219 255 263   Basic Metabolic Panel:  Recent Labs Lab 07/26/16 1211 07/27/16 0530 07/29/16 0343 07/29/16 0847 07/30/16 0408  NA 131* 128* 131*  --  132*  K 4.9 4.6 4.2  --  4.3  CL 89* 86* 88*  --  87*  CO2 31 31 33*  --  36*  GLUCOSE 143* 188* 69 36* 269*  BUN 16 27* 38*  --  32*  CREATININE 0.81 1.16* 0.81  --  0.77  CALCIUM 9.6 9.0 9.3  --  9.3   GFR: Estimated Creatinine Clearance: 30.4  mL/min (by C-G formula based on SCr of 0.77 mg/dL). Liver Function Tests: No results for input(s): AST, ALT, ALKPHOS, BILITOT, PROT, ALBUMIN in the last 168 hours. No results for input(s): LIPASE, AMYLASE in the last 168 hours. No results for input(s): AMMONIA in the last 168 hours. Coagulation Profile: No results for input(s): INR, PROTIME in the last 168 hours. Cardiac Enzymes: No results for input(s): CKTOTAL, CKMB, CKMBINDEX, TROPONINI in the last 168 hours. BNP (last 3 results) No results for input(s): PROBNP in the last 8760 hours. HbA1C: No results for input(s): HGBA1C in the last 72 hours. CBG:  Recent Labs Lab 07/30/16 1125 07/30/16 1611 07/30/16 2145 07/31/16 0801 07/31/16 1116  GLUCAP 221* 366* 269* 136* 248*   Lipid Profile: No results for input(s): CHOL, HDL, LDLCALC, TRIG, CHOLHDL, LDLDIRECT in the last 72 hours. Thyroid Function Tests: No results for input(s): TSH, T4TOTAL, FREET4, T3FREE, THYROIDAB in the last 72 hours. Anemia Panel: No results for input(s): VITAMINB12, FOLATE, FERRITIN, TIBC, IRON, RETICCTPCT in the last 72 hours. Urine analysis:    Component Value Date/Time   COLORURINE YELLOW 04/23/2016 1755   APPEARANCEUR HAZY (A) 04/23/2016 1755   LABSPEC 1.011 04/23/2016 1755   PHURINE 5.0 04/23/2016 1755   GLUCOSEU NEGATIVE 04/23/2016 1755   GLUCOSEU NEGATIVE 05/13/2010 1224   HGBUR NEGATIVE 04/23/2016 1755   BILIRUBINUR NEGATIVE 04/23/2016 1755   KETONESUR NEGATIVE 04/23/2016 1755   PROTEINUR NEGATIVE 04/23/2016 1755   UROBILINOGEN 0.2 01/13/2015 1638   NITRITE NEGATIVE 04/23/2016 1755   LEUKOCYTESUR SMALL (A) 04/23/2016 1755   Sepsis Labs: @LABRCNTIP (procalcitonin:4,lacticidven:4)  ) Recent Results (from the past 240 hour(s))  MRSA PCR Screening     Status: None   Collection Time: 07/27/16 11:50 AM  Result Value Ref Range Status   MRSA by PCR NEGATIVE NEGATIVE Final    Comment:        The GeneXpert MRSA Assay (FDA approved for NASAL  specimens only), is one component of a comprehensive MRSA colonization surveillance program. It is not intended to diagnose MRSA infection nor to guide or monitor treatment for MRSA infections.          Radiology Studies: No results found.      Scheduled Meds: . amLODipine  5 mg Oral Daily  . aspirin EC  81 mg Oral Daily  . atorvastatin  10 mg Oral q1800  . azithromycin  250 mg Oral Daily  . carvedilol  6.25 mg Oral BID WC  . digoxin  0.0625 mg Oral Daily  . enoxaparin (LOVENOX) injection  30 mg Subcutaneous  Q24H  . feeding supplement (GLUCERNA SHAKE)  237 mL Oral BID BM  . furosemide  40 mg Oral Daily  . insulin aspart  0-20 Units Subcutaneous TID WC  . insulin aspart  0-5 Units Subcutaneous QHS  . insulin glargine  10 Units Subcutaneous QHS  . ipratropium-albuterol  3 mL Nebulization BID  . mouth rinse  15 mL Mouth Rinse BID  . methylPREDNISolone (SOLU-MEDROL) injection  40 mg Intravenous Q12H  . multivitamin with minerals  1 tablet Oral Daily  . [START ON 08/01/2016] predniSONE  50 mg Oral Q breakfast   Continuous Infusions: . sodium chloride Stopped (07/28/16 0700)     LOS: 5 days    Time spent:    Zannie Cove, MD [facility] Pager [phone]  If 7PM-7AM, please contact night-coverage [email or web address] Password [email or web address] 07/31/2016, 1:08 PM

## 2016-07-31 NOTE — [place] FL2 (Signed)
[place] MEDICAID FL2 LEVEL OF CARE SCREENING TOOL     IDENTIFICATION  Patient Name: [name] Birthdate: 1929/08/23 Sex: female Admission Date (Current Location): 07/26/2016  [facility] and [place] Number:  Producer, television/film/video and Address:  [facility] [place], [address] [place], [place] [address]      Provider Number: [id number]  Attending Physician Name and Address:  Zannie Cove, MD  Relative Name and Phone Number:       Current Level of Care: Hospital Recommended Level of Care: Skilled Nursing Facility Prior Approval Number:    Date Approved/Denied:   PASRR Number: [id number]  Discharge Plan: SNF    Current Diagnoses: Patient Active Problem List   Diagnosis Date Noted  . Protein-calorie malnutrition, severe 07/28/2016  . Pressure injury of skin 07/27/2016  . COPD with acute exacerbation (HCC) 07/26/2016  . Acute on chronic respiratory failure with hypoxia (HCC) 07/26/2016  . Essential hypertension 07/26/2016  . Dyslipidemia associated with type 2 diabetes mellitus (HCC) 07/26/2016  . Diabetes mellitus without complication, without long-term current use of insulin (HCC) 07/26/2016  . Acute bronchitis 07/26/2016  . Underweight 05/27/2015  . Chronic diastolic heart failure (HCC) 11/24/2011    Orientation RESPIRATION BLADDER Height & Weight     Self, Time, Situation, Place  O2 (2L [place]) Continent Weight: 81 lb 3.2 oz (36.8 kg) Height:  5\' 2"  (157.5 cm)  BEHAVIORAL SYMPTOMS/MOOD NEUROLOGICAL BOWEL NUTRITION STATUS      Continent Diet (carb modified)  AMBULATORY STATUS COMMUNICATION OF NEEDS Skin   Limited Assist Verbally PU Stage and Appropriate Care, Other (Comment) (PU on sacrum foam dressing PRN, stage 1 on buttocks foam dressing PRN)                       Personal Care Assistance Level of Assistance  Bathing, Dressing Bathing Assistance: Limited assistance   Dressing Assistance: Limited assistance     Functional  Limitations Info             SPECIAL CARE FACTORS FREQUENCY  PT (By licensed PT), OT (By licensed OT)     PT Frequency: 5/wk OT Frequency: 5/wk            Contractures      Additional Factors Info  Code Status, Allergies, Insulin Sliding Scale Code Status Info: FULL Allergies Info: NKA   Insulin Sliding Scale Info: 5/day       Current Medications (07/31/2016):  This is the current hospital active medication list Current Facility-Administered Medications  Medication Dose Route Frequency Provider Last Rate Last Dose  . 0.9 %  sodium chloride infusion   Intravenous Continuous Alison Murray, MD   Stopped at 07/28/16 0700  . acetaminophen (TYLENOL) tablet 1,000 mg  1,000 mg Oral Q6H PRN Alison Murray, MD      . amLODipine (NORVASC) tablet 5 mg  5 mg Oral Daily Alison Murray, MD   5 mg at 07/31/16 4503  . aspirin EC tablet 81 mg  81 mg Oral Daily Alison Murray, MD   81 mg at 07/31/16 8882  . atorvastatin (LIPITOR) tablet 10 mg  10 mg Oral q1800 Alison Murray, MD   10 mg at 07/30/16 1742  . azithromycin (ZITHROMAX) tablet 250 mg  250 mg Oral Daily Zannie Cove, MD   250 mg at 07/31/16 8003  . carvedilol (COREG) tablet 6.25 mg  6.25 mg Oral BID WC Alison Murray, MD   6.25  mg at 07/31/16 0625  . digoxin (LANOXIN) tablet 0.0625 mg  0.0625 mg Oral Daily Alison Murray, MD   0.0625 mg at 07/31/16 1610  . enoxaparin (LOVENOX) injection 30 mg  30 mg Subcutaneous Q24H Sallee Provencal, RPH   30 mg at 07/31/16 1228  . feeding supplement (GLUCERNA SHAKE) (GLUCERNA SHAKE) liquid 237 mL  237 mL Oral BID BM Zannie Cove, MD   237 mL at 07/31/16 1010  . fluticasone (FLONASE) 50 MCG/ACT nasal spray 1 spray  1 spray Each Nare Daily PRN Alison Murray, MD      . furosemide (LASIX) tablet 40 mg  40 mg Oral Daily Zannie Cove, MD   40 mg at 07/31/16 9604  . insulin aspart (novoLOG) injection 0-20 Units  0-20 Units Subcutaneous TID WC Zannie Cove, MD   7 Units at 07/31/16 1228  . insulin  aspart (novoLOG) injection 0-5 Units  0-5 Units Subcutaneous QHS Zannie Cove, MD   3 Units at 07/30/16 2214  . insulin glargine (LANTUS) injection 10 Units  10 Units Subcutaneous QHS Zannie Cove, MD   10 Units at 07/30/16 2215  . ipratropium-albuterol (DUONEB) 0.5-2.5 (3) MG/3ML nebulizer solution 3 mL  3 mL Nebulization Q2H PRN Alison Murray, MD   3 mL at 07/27/16 0943  . ipratropium-albuterol (DUONEB) 0.5-2.5 (3) MG/3ML nebulizer solution 3 mL  3 mL Nebulization BID Zannie Cove, MD   3 mL at 07/31/16 5409  . MEDLINE mouth rinse  15 mL Mouth Rinse BID Zannie Cove, MD   15 mL at 07/31/16 1000  . methylPREDNISolone sodium succinate (SOLU-MEDROL) 40 mg/mL injection 40 mg  40 mg Intravenous Q12H Zannie Cove, MD      . multivitamin with minerals tablet 1 tablet  1 tablet Oral Daily Zannie Cove, MD   1 tablet at 07/31/16 [phone]  . ondansetron (ZOFRAN) tablet 4 mg  4 mg Oral Q6H PRN Alison Murray, MD       Or  . ondansetron [facility]) injection 4 mg  4 mg Intravenous Q6H PRN Alison Murray, MD      . Melene Muller ON 08/01/2016] predniSONE (DELTASONE) tablet 50 mg  50 mg Oral Q breakfast Zannie Cove, MD         Discharge Medications: Please see discharge summary for a list of discharge medications.  Relevant Imaging Results:  Relevant Lab Results:   Additional Information SS#: [id number]  Burna Sis, LCSW

## 2016-08-01 LAB — HEMOGLOBIN A1C
HEMOGLOBIN A1C: 6.9 % — AB (ref 4.8–5.6)
Mean Plasma Glucose: 151 mg/dL

## 2016-08-01 LAB — GLUCOSE, CAPILLARY
GLUCOSE-CAPILLARY: 280 mg/dL — AB (ref 65–99)
GLUCOSE-CAPILLARY: 296 mg/dL — AB (ref 65–99)
Glucose-Capillary: 246 mg/dL — ABNORMAL HIGH (ref 65–99)
Glucose-Capillary: 303 mg/dL — ABNORMAL HIGH (ref 65–99)

## 2016-08-01 MED ORDER — PREDNISONE 20 MG PO TABS
40.0000 mg | ORAL_TABLET | Freq: Every day | ORAL | Status: DC
  Administered 2016-08-01 – 2016-08-02 (×2): 40 mg via ORAL
  Filled 2016-08-01: qty 2

## 2016-08-01 NOTE — Progress Notes (Signed)
Physical Therapy Treatment Patient Details Name: [name] MRN: [id number] DOB: June 12, 1929 Today's Date: 08/01/2016    History of Present Illness 83 y.o. female with medical history significant for dyslipidemia, hypertension, DM (on Amaryl and metformin), COPD. She was recently seen by pulmonary (07/20/16) for ongoing shortness of breath for past few weeks     PT Comments    Pt without LOB during gait today, but does tend to sit too early when transferring.  She needs cues for safety with mobility.  Con't to recommend SNF. Family requesting rollator.  Follow Up Recommendations  SNF;Supervision for mobility/OOB     Equipment Recommendations  Other (comment) (rollator)    Recommendations for Other Services       Precautions / Restrictions Precautions Precautions: Fall Precaution Comments: watch o2 saturations Restrictions Weight Bearing Restrictions: No    Mobility  Bed Mobility Overal bed mobility: Needs Assistance Bed Mobility: Supine to Sit     Supine to sit: Supervision     General bed mobility comments: S only with HOB elevated and with rail  Transfers Overall transfer level: Needs assistance Equipment used: Rolling walker (2 wheeled) Transfers: Sit to/from [employer] Sit to Stand: Min guard Stand pivot transfers: Min assist       General transfer comment: cues for hand placement with standing.  MIN A with SPT due to pt wanting to sit on BSC far too early and needed A at hips to get them fully turned  Ambulation/Gait Ambulation/Gait assistance: Min guard;Min assist Ambulation Distance (Feet): 95 Feet Assistive device: Rolling walker (2 wheeled) Gait Pattern/deviations: Step-through pattern;Trunk flexed;Narrow base of support Gait velocity: decreased   General Gait Details: Pt without any LOB during gait today. o2 intact at 2 L/min, but o2 machine not reading. Pt denied SOB, but just feeling tired.   Stairs             Wheelchair Mobility    Modified Rankin (Stroke Patients Only)       Balance Overall balance assessment: Needs assistance Sitting-balance support: No upper extremity supported;Feet supported Sitting balance-Leahy Scale: Fair     Standing balance support: Bilateral upper extremity supported;During functional activity Standing balance-Leahy Scale: Poor Standing balance comment: reliant on UE support                            Cognition Arousal/Alertness: Awake/alert Behavior During Therapy: WFL for tasks assessed/performed Overall Cognitive Status: History of cognitive impairments - at baseline                         Following Commands: Follows one step commands consistently;Follows multi-step commands with increased time Safety/Judgement: Decreased awareness of safety Awareness: Emergent Problem Solving: Requires verbal cues;Requires tactile cues        Exercises      General Comments        Pertinent Vitals/Pain Pain Assessment: No/denies pain    Home Living                      Prior Function            PT Goals (current goals can now be found in the care plan section) Acute Rehab PT Goals Patient Stated Goal: to get better PT Goal Formulation: With patient/family Time For Goal Achievement: 08/13/16 Potential to Achieve Goals: Good Progress towards PT goals: Progressing toward goals    Frequency    Min  2X/week      PT Plan Current plan remains appropriate    Co-evaluation             End of Session Equipment Utilized During Treatment: Gait belt;Oxygen Activity Tolerance: Patient tolerated treatment well Patient left: in chair;with call bell/phone within reach;with family/visitor present (Family aware to have staff turn on chair alarm if she leaves)   PT Visit Diagnosis: Unsteadiness on feet (R26.81);Muscle weakness (generalized) (M62.81)     Time: 1610-9604 PT Time Calculation (min) (ACUTE ONLY): 17  min  Charges:  $Gait Training: 8-22 mins                    G Codes:       Niylah Hassan L. Katrinka Blazing, [place] Pager [phone] 08/01/2016    Enzo Montgomery 08/01/2016, 12:24 PM

## 2016-08-01 NOTE — Progress Notes (Signed)
Inpatient Diabetes Program Recommendations  AACE/ADA: New Consensus Statement on Inpatient Glycemic Control (2015)  Target Ranges:  Prepandial:   less than 140 mg/dL      Peak postprandial:   less than 180 mg/dL (1-2 hours)      Critically ill patients:  140 - 180 mg/dL   Results for RAMONDA, FEDOROWICZ (MRN [id number]) as of 08/01/2016 15:18  Ref. Range 07/31/2016 08:01 07/31/2016 11:16 07/31/2016 16:46 07/31/2016 21:41 08/01/2016 07:50 08/01/2016 11:17  Glucose-Capillary Latest Ref Range: 65 - 99 mg/dL 809 (H) 983 (H) 382 (H) 176 (H) 280 (H) 296 (H)   Review of Glycemic Control  Current orders for Inpatient glycemic control: Lantus 10 units QHS, Novolog 0-20 units TID with meals, Novolog 0-5 units QHS  Inpatient Diabetes Program Recommendations: Insulin - Meal Coverage: Please consider ordering Novolog 4 units TID with meals for meal coverage if patient eats at least 50% of meals.  Thanks, Orlando Penner, RN, MSN, CDE Diabetes Coordinator Inpatient Diabetes Program [phone] (Team Pager from 8am to 5pm)

## 2016-08-01 NOTE — Clinical Social Work Note (Signed)
Clinical Social Work Assessment  Patient Details  Name: UMEKA WRENCH MRN: [id number] Date of Birth: [birth year]  Date of referral:  08/01/16               Reason for consult:  Discharge Planning                Permission sought to share information with:  Family Supports Permission granted to share information::  Yes, Verbal Permission Granted  Name::     Anabel Bene  Agency::     Relationship::  daughter  Contact Information:  [phone]  Housing/Transportation Living arrangements for the past 2 months:  Single Family Home Source of Information:  Patient Patient Interpreter Needed:  None Criminal Activity/Legal Involvement Pertinent to Current Situation/Hospitalization:  No - Comment as needed Significant Relationships:  Adult Children Lives with:  Adult Children Do you feel safe going back to the place where you live?  Yes Need for family participation in patient care:  No (Coment)  Care giving concerns:  Patients daughter in the room during assessment.  Social Worker assessment / plan:  CSW met patient at bedside to offer support and discuss needs at discharge. Patient daughter stated that patient lives at home with her. Daughter stated that she is agreeable for patient to discharge to SNF.  Patient and family was given a list of facility's that have bed availability. Patients daughter stated she will get in contact with CSW once facility has been chosen. Employment status:  Retired Forensic scientist:  Medicare PT Recommendations:  [place] / Referral to community resources:  [place]  Patient/Family's Response to care:  Family verbalized appreciation and understanding for CSW role and involvement in care. Patient agreeable with current discharge plan to SNF.  Patient/Family's Understanding of and Emotional Response to Diagnosis, Current Treatment, and Prognosis:  Patient with good understanding of current medical state and  limitations around most recent hospitalization. Patient is agreeable with SNF placement in hopes of transitioning to a more stable living environment.   Emotional Assessment Appearance:  Appears stated age Attitude/Demeanor/Rapport:  Other (approprite ) Affect (typically observed):  Pleasant Orientation:  Oriented to Self, Oriented to Place, Oriented to  Time, Oriented to Situation Alcohol / Substance use:  Not Applicable Psych involvement (Current and /or in the community):  No (Comment)  Discharge Needs  Concerns to be addressed:  No discharge needs identified Readmission within the last 30 days:  No Current discharge risk:  None Barriers to Discharge:  No Barriers Identified   Wende Neighbors, LCSW 08/01/2016, 12:15 PM

## 2016-08-01 NOTE — Consult Note (Signed)
   [facility] CM Inpatient Consult   08/01/2016  [name] 03-31-1930 [id number]     [facility] Care Management follow up. Went to bedside to speak with patient and daughter, [name]. Discharge plan has changed to SNF. [name] states she is not sure which facility at this time. Discussed that writer will request for [facility] LCSW to be assigned once facility is known. [name] (daughter) indicates SNF placement will be for short term and upon discharge from facility to home they will need equipment such as hospital bed, rollator and the like. Will pass this information along to ensure that upon discharge from SNF, [name] will have the necessary equipment.  Will continue to follow and will request to be assigned to [facility] LCSW. Will update the [facility] RNCM of disposition change. Will make inpatient RNCM aware [facility] Care Management is following.    Raiford Noble, MSN-Ed, RN,BSN [facility] Liaison [phone]

## 2016-08-01 NOTE — Progress Notes (Signed)
   08/01/16 2348  Respiratory Severity Assessment  Respiratory History 2  Breath Sounds 1  Respiratory Pattern 0  Cough 0  Chest X Ray 0  O2 Requirements 1  Level of Activity 1  Dyspnea 1  Score Total 6  Aerosolized Bronchodilators  Aerosolized bronchodilator indications Aerosolized bronchodilator indicated  Aerosolized bronchodilator goals Bronchodilation  Plan of care Hand held neb treatment  Oxygen Therapy  Oxygen therapy indications Oxygen therapy indicated  Oxygen therapy goals Treat hypoxia/hypoxemia  Plan of care Nasal cannula  Respiratory Therapy Follow Up Assessment  Assessment follow up date 08/03/16  Patient was assessed by respiratory. She will continue receiving Duoneb treatment Bid. Since the home meds regimen indicates patient takes Symbicort at home RT recommends a substitute order for the Symbicort while patient is in the hospital.

## 2016-08-01 NOTE — Progress Notes (Signed)
PROGRESS NOTE    [name]  FAO:[id number] DOB: 07/02/1929 DOA: 07/26/2016 PCP: [name], MD  Brief Narrative: [name] is a 83 y.o. female with medical history significant for dyslipidemia, hypertension, DM (on Amaryl and metformin), COPD. She was recently seen by pulmonary (07/20/16) for ongoing shortness of breath for past few weeks or so and was given oxygen at home, Prednisone, Abx, started on 3 L [place] oxygen. Over past 24 hours her shortness of breath and cough productive of clear sputum.  Her 12 lead EKG showed atrial fibrillation on admission but her heart rate in now 81. CXR showed mild lingular atelectasis 4/4: noted to have more resp distress, hypoxia  Assessment & Plan:    Acute on chronic respiratory failure with hypoxia (HCC) /  -improving, off BIPAP -COPD with acute exacerbation -cut down IV solumedrol, change to prednisone, nebs, Abx-doxycycline -2D ECHO with EF 45%,  -continue PO lasix -OOB, ambulate, weaned down to 2L, Discharge planning SNF soon    ACute on Chronic diastolic heart failure (HCC)  - 2 D EHCO in 2012 showed EF 55% and grade 1 DD -repeat ECHO with EF 45%, Continue digoxin, euvolemic,  -now on PO lasix 40mg  at discharge instead of 20mg , I do not think she needs an ischemia eval, will ask her to FU with Cards    PACs -on Admission EKG-not afib, p waves noted, monitor, in NSR now    Essential hypertension - Resumed home meds: Norvasc, hold benazepril     Dyslipidemia associated with type 2 diabetes mellitus (HCC) - Continue Lipitor    Diabetes mellitus without complication, without long-term current use of insulin (HCC) - hold Amaryl and metformin  - SSI for now  Severe malnutrition - Nutrition consulted - Body mass index is 17.51 kg/m, encouraged supplements  DVT prophylaxis: Lovenox subQ Code Status: full code Family Communication: daughter at bedside  Disposition Plan: SNF tomorrow   Antimicrobials:    Doxycycline   Subjective: Breathing improving, almost at baseline, eating better  Objective: Vitals:   08/01/16 0054 08/01/16 0409 08/01/16 0843 08/01/16 1342  BP: (!) 145/68 (!) 147/75 140/61 (!) 142/47  Pulse: 82 62 71 84  Resp: 15 16 16 16   Temp: 98.4 F (36.9 C) 98.5 F (36.9 C)  97.7 F (36.5 C)  TempSrc:  Oral  Oral  SpO2: 94% 100% 100% 99%  Weight:  36.4 kg (80 lb 4 oz)    Height:        Intake/Output Summary (Last 24 hours) at 08/01/16 1545 Last data filed at 08/01/16 1244  Gross per 24 hour  Intake              360 ml  Output             1101 ml  Net             -741 ml   Filed Weights   07/30/16 0436 07/31/16 0309 08/01/16 0409  Weight: 36.9 kg (81 lb 6.4 oz) 36.8 kg (81 lb 3.2 oz) 36.4 kg (80 lb 4 oz)    Examination:  General exam: Appears calm and comfortable, no distress  Respiratory system: improved air movement, no wheezes today Cardiovascular system: S1 & S2 heard, RRR. No JVD, murmurs Gastrointestinal system: Abdomen is nondistended, soft and nontender. Normal bowel sounds heard. Central nervous system: Alert and oriented. No focal neurological deficits. Extremities: Symmetric 5 x 5 power. Skin: No rashes, lesions or ulcers Psychiatry: Judgement and insight appear normal. Mood &  affect appropriate.     Data Reviewed:   CBC:  Recent Labs Lab 07/26/16 1211 07/27/16 0530 07/29/16 0343 07/30/16 0408  WBC 15.1* 13.4* 13.0* 12.0*  NEUTROABS 11.9*  --   --   --   HGB 11.9* 10.9* 10.8* 10.6*  HCT 37.0 33.6* 33.0* 32.9*  MCV 94.9 93.6 90.9 92.2  PLT 228 219 255 263   Basic Metabolic Panel:  Recent Labs Lab 07/26/16 1211 07/27/16 0530 07/29/16 0343 07/29/16 0847 07/30/16 0408  NA 131* 128* 131*  --  132*  K 4.9 4.6 4.2  --  4.3  CL 89* 86* 88*  --  87*  CO2 31 31 33*  --  36*  GLUCOSE 143* 188* 69 36* 269*  BUN 16 27* 38*  --  32*  CREATININE 0.81 1.16* 0.81  --  0.77  CALCIUM 9.6 9.0 9.3  --  9.3   GFR: Estimated  Creatinine Clearance: 30.1 mL/min (by C-G formula based on SCr of 0.77 mg/dL). Liver Function Tests: No results for input(s): AST, ALT, ALKPHOS, BILITOT, PROT, ALBUMIN in the last 168 hours. No results for input(s): LIPASE, AMYLASE in the last 168 hours. No results for input(s): AMMONIA in the last 168 hours. Coagulation Profile: No results for input(s): INR, PROTIME in the last 168 hours. Cardiac Enzymes: No results for input(s): CKTOTAL, CKMB, CKMBINDEX, TROPONINI in the last 168 hours. BNP (last 3 results) No results for input(s): PROBNP in the last 8760 hours. HbA1C:  Recent Labs  07/31/16 1045  HGBA1C 6.9*   CBG:  Recent Labs Lab 07/31/16 1646 07/31/16 2141 08/01/16 0750 08/01/16 1117 08/01/16 1543  GLUCAP 369* 176* 280* 296* 246*   Lipid Profile: No results for input(s): CHOL, HDL, LDLCALC, TRIG, CHOLHDL, LDLDIRECT in the last 72 hours. Thyroid Function Tests: No results for input(s): TSH, T4TOTAL, FREET4, T3FREE, THYROIDAB in the last 72 hours. Anemia Panel: No results for input(s): VITAMINB12, FOLATE, FERRITIN, TIBC, IRON, RETICCTPCT in the last 72 hours. Urine analysis:    Component Value Date/Time   COLORURINE YELLOW 04/23/2016 1755   APPEARANCEUR HAZY (A) 04/23/2016 1755   LABSPEC 1.011 04/23/2016 1755   PHURINE 5.0 04/23/2016 1755   GLUCOSEU NEGATIVE 04/23/2016 1755   GLUCOSEU NEGATIVE 05/13/2010 1224   HGBUR NEGATIVE 04/23/2016 1755   BILIRUBINUR NEGATIVE 04/23/2016 1755   KETONESUR NEGATIVE 04/23/2016 1755   PROTEINUR NEGATIVE 04/23/2016 1755   UROBILINOGEN 0.2 01/13/2015 1638   NITRITE NEGATIVE 04/23/2016 1755   LEUKOCYTESUR SMALL (A) 04/23/2016 1755   Sepsis Labs: @LABRCNTIP (procalcitonin:4,lacticidven:4)  ) Recent Results (from the past 240 hour(s))  MRSA PCR Screening     Status: None   Collection Time: 07/27/16 11:50 AM  Result Value Ref Range Status   MRSA by PCR NEGATIVE NEGATIVE Final    Comment:        The GeneXpert MRSA Assay  (FDA approved for NASAL specimens only), is one component of a comprehensive MRSA colonization surveillance program. It is not intended to diagnose MRSA infection nor to guide or monitor treatment for MRSA infections.          Radiology Studies: No results found.      Scheduled Meds: . amLODipine  5 mg Oral Daily  . aspirin EC  81 mg Oral Daily  . atorvastatin  10 mg Oral q1800  . azithromycin  250 mg Oral Daily  . carvedilol  6.25 mg Oral BID WC  . digoxin  0.0625 mg Oral Daily  . enoxaparin (LOVENOX) injection  30 mg  Subcutaneous Q24H  . feeding supplement (GLUCERNA SHAKE)  237 mL Oral BID BM  . furosemide  40 mg Oral Daily  . insulin aspart  0-20 Units Subcutaneous TID WC  . insulin aspart  0-5 Units Subcutaneous QHS  . insulin glargine  10 Units Subcutaneous QHS  . ipratropium-albuterol  3 mL Nebulization BID  . mouth rinse  15 mL Mouth Rinse BID  . multivitamin with minerals  1 tablet Oral Daily  . predniSONE  40 mg Oral Q breakfast   Continuous Infusions: . sodium chloride Stopped (07/28/16 0700)     LOS: 6 days    Time spent:    Zannie Cove, MD [facility] Pager [phone]  If 7PM-7AM, please contact night-coverage [email or web address] Password [facility] 08/01/2016, 3:45 PM

## 2016-08-01 NOTE — Progress Notes (Signed)
   08/01/16 2033  BiPAP/CPAP/SIPAP  BiPAP/CPAP/SIPAP Pt Type (Bipap not in room. Patient does not need it at this time.)  BiPAP/CPAP /SiPAP Vitals  Bilateral Breath Sounds Diminished

## 2016-08-02 DIAGNOSIS — I5033 Acute on chronic diastolic (congestive) heart failure: Secondary | ICD-10-CM | POA: Diagnosis not present

## 2016-08-02 DIAGNOSIS — S31010A Laceration without foreign body of lower back and pelvis without penetration into retroperitoneum, initial encounter: Secondary | ICD-10-CM | POA: Diagnosis not present

## 2016-08-02 DIAGNOSIS — J449 Chronic obstructive pulmonary disease, unspecified: Secondary | ICD-10-CM | POA: Diagnosis not present

## 2016-08-02 DIAGNOSIS — E785 Hyperlipidemia, unspecified: Secondary | ICD-10-CM | POA: Diagnosis not present

## 2016-08-02 DIAGNOSIS — L989 Disorder of the skin and subcutaneous tissue, unspecified: Secondary | ICD-10-CM | POA: Diagnosis not present

## 2016-08-02 DIAGNOSIS — R5381 Other malaise: Secondary | ICD-10-CM | POA: Diagnosis not present

## 2016-08-02 DIAGNOSIS — L89310 Pressure ulcer of right buttock, unstageable: Secondary | ICD-10-CM | POA: Diagnosis not present

## 2016-08-02 DIAGNOSIS — E11622 Type 2 diabetes mellitus with other skin ulcer: Secondary | ICD-10-CM | POA: Diagnosis not present

## 2016-08-02 DIAGNOSIS — R4189 Other symptoms and signs involving cognitive functions and awareness: Secondary | ICD-10-CM | POA: Diagnosis not present

## 2016-08-02 DIAGNOSIS — R488 Other symbolic dysfunctions: Secondary | ICD-10-CM | POA: Diagnosis not present

## 2016-08-02 DIAGNOSIS — R1312 Dysphagia, oropharyngeal phase: Secondary | ICD-10-CM | POA: Diagnosis not present

## 2016-08-02 DIAGNOSIS — E119 Type 2 diabetes mellitus without complications: Secondary | ICD-10-CM | POA: Diagnosis not present

## 2016-08-02 DIAGNOSIS — R636 Underweight: Secondary | ICD-10-CM | POA: Diagnosis not present

## 2016-08-02 DIAGNOSIS — L89213 Pressure ulcer of right hip, stage 3: Secondary | ICD-10-CM | POA: Diagnosis not present

## 2016-08-02 DIAGNOSIS — R531 Weakness: Secondary | ICD-10-CM | POA: Diagnosis not present

## 2016-08-02 DIAGNOSIS — J9611 Chronic respiratory failure with hypoxia: Secondary | ICD-10-CM | POA: Diagnosis not present

## 2016-08-02 DIAGNOSIS — E43 Unspecified severe protein-calorie malnutrition: Secondary | ICD-10-CM | POA: Diagnosis not present

## 2016-08-02 DIAGNOSIS — J309 Allergic rhinitis, unspecified: Secondary | ICD-10-CM | POA: Diagnosis not present

## 2016-08-02 DIAGNOSIS — I10 Essential (primary) hypertension: Secondary | ICD-10-CM | POA: Diagnosis not present

## 2016-08-02 DIAGNOSIS — J80 Acute respiratory distress syndrome: Secondary | ICD-10-CM | POA: Diagnosis not present

## 2016-08-02 DIAGNOSIS — E1169 Type 2 diabetes mellitus with other specified complication: Secondary | ICD-10-CM | POA: Diagnosis not present

## 2016-08-02 DIAGNOSIS — B370 Candidal stomatitis: Secondary | ICD-10-CM | POA: Diagnosis not present

## 2016-08-02 DIAGNOSIS — I5032 Chronic diastolic (congestive) heart failure: Secondary | ICD-10-CM | POA: Diagnosis not present

## 2016-08-02 DIAGNOSIS — D72829 Elevated white blood cell count, unspecified: Secondary | ICD-10-CM | POA: Diagnosis not present

## 2016-08-02 DIAGNOSIS — R278 Other lack of coordination: Secondary | ICD-10-CM | POA: Diagnosis not present

## 2016-08-02 DIAGNOSIS — J441 Chronic obstructive pulmonary disease with (acute) exacerbation: Secondary | ICD-10-CM | POA: Diagnosis not present

## 2016-08-02 DIAGNOSIS — R2689 Other abnormalities of gait and mobility: Secondary | ICD-10-CM | POA: Diagnosis not present

## 2016-08-02 DIAGNOSIS — E11628 Type 2 diabetes mellitus with other skin complications: Secondary | ICD-10-CM | POA: Diagnosis not present

## 2016-08-02 DIAGNOSIS — M6281 Muscle weakness (generalized): Secondary | ICD-10-CM | POA: Diagnosis not present

## 2016-08-02 DIAGNOSIS — J9601 Acute respiratory failure with hypoxia: Secondary | ICD-10-CM | POA: Diagnosis not present

## 2016-08-02 DIAGNOSIS — D638 Anemia in other chronic diseases classified elsewhere: Secondary | ICD-10-CM | POA: Diagnosis not present

## 2016-08-02 DIAGNOSIS — J9621 Acute and chronic respiratory failure with hypoxia: Secondary | ICD-10-CM | POA: Diagnosis not present

## 2016-08-02 DIAGNOSIS — I255 Ischemic cardiomyopathy: Secondary | ICD-10-CM | POA: Diagnosis not present

## 2016-08-02 DIAGNOSIS — E139 Other specified diabetes mellitus without complications: Secondary | ICD-10-CM | POA: Diagnosis not present

## 2016-08-02 LAB — GLUCOSE, CAPILLARY
Glucose-Capillary: 105 mg/dL — ABNORMAL HIGH (ref 65–99)
Glucose-Capillary: 270 mg/dL — ABNORMAL HIGH (ref 65–99)

## 2016-08-02 MED ORDER — PREDNISONE 20 MG PO TABS: ORAL_TABLET | ORAL | Status: DC

## 2016-08-02 MED ORDER — BENAZEPRIL HCL 10 MG PO TABS: 10.0000 mg | ORAL_TABLET | Freq: Every day | ORAL | Status: DC

## 2016-08-02 MED ORDER — FUROSEMIDE 20 MG PO TABS: 40.0000 mg | ORAL_TABLET | Freq: Every day | ORAL | Status: DC

## 2016-08-02 MED ORDER — IPRATROPIUM-ALBUTEROL 0.5-2.5 (3) MG/3ML IN SOLN: 3.0000 mL | RESPIRATORY_TRACT | Status: DC | PRN

## 2016-08-02 NOTE — Discharge Summary (Signed)
Physician Discharge Summary  [name] BTD:[id number] DOB: [birth year] DOA: 07/26/2016  PCP: [name], MD  Admit date: 07/26/2016 Discharge date: 08/02/2016  Time spent:  Recommendations for Outpatient Follow-up:  1. PCP in 1 week, please check Bmet at Follow up 2. Please refer to cardiology for outpatient Follow up for cardiomyopathy   Discharge Diagnoses:  Principal Problem:   Acute on chronic respiratory failure with hypoxia (HCC)   COPD exacerbation:   Chronic diastolic heart failure (HCC)   Chronic obstructive pulmonary disease (HCC)   Essential hypertension   Dyslipidemia associated with type 2 diabetes mellitus (HCC)   Diabetes mellitus without complication, without long-term current use of insulin (HCC)   Acute bronchitis   Protein-calorie malnutrition, severe   Discharge Condition: improved  Diet recommendation: low sodium heart healthy  Filed Weights   07/31/16 0309 08/01/16 0409 08/02/16 0500  Weight: 36.8 kg (81 lb 3.2 oz) 36.4 kg (80 lb 4 oz) 36.2 kg (79 lb 12.8 oz)    History of present illness:  Debra L Rogersis a 83 y.o.femalewith medical history significant for dyslipidemia, hypertension, DM (on Amaryl and metformin), COPD. She was recently seen by pulmonary (07/20/16) for ongoing shortness of breath for past few weeks or so and was given oxygen at home, Prednisone, Abx, started on 3 L [place] oxygen. Presented to ER due to worsening dyspnea, cough  Hospital Course:  Acute on chronic respiratory failure with hypoxia (HCC) /  -improving, off BIPAP -COPD with acute exacerbation -treated with IV solumedrol, Abx, nebs -improved, changed to prednisone taper -2D ECHO with EF 45%,  -continue PO lasix -Discharge to SNF today, FU with PCP in SNF in 2-3days, please check Bmet at FU  ACute on Chronic diastolic heart failure (HCC) - 2 D EHCO in 2012 showed EF 55% and grade 1 DD -repeat ECHO with EF 45%, Continued digoxin, started on PO lasix  40mg  at discharge instead of 20mg , I do not think she needs an ischemia eval at this time, due to advanced age and lack of symtpoms, will ask her to FU with Cardiology, benazepril restarted at low dose of 10mg     PACs -on Admission EKG-not afib, p waves noted, monitor, in NSR now  Essential hypertension - Resumed home meds: Norvasc, and benazepril at low dose  Dyslipidemia associated with type 2 diabetes mellitus (HCC) - Continue Lipitor  Diabetes mellitus without complication, without long-term current use of insulin (HCC) - resumed Amaryl and metformin  - CBGs higher in setting of steroids, should improve when tapered off  Severe malnutrition - Nutrition consulted - Body mass index is 17.51 kg/m, encouraged supplements, glucerna TID added   Discharge Exam: Vitals:   08/02/16 0500 08/02/16 1437  BP: (!) 131/57 132/68  Pulse: 65 82  Resp: 16   Temp: 98.8 F (37.1 C) 99.3 F (37.4 C)    General: AAOx3, frail cachectic Cardiovascular: S1S2/RRR Respiratory: improved air movement  Discharge Instructions   Discharge Instructions    AMB Referral to [facility] Care Management    Complete by:  As directed    Please assign to [facility] RNCM for transition of care. Written consent obtained. [name] (daughter) primary contact person for post discharge calls at [phone] (on consent). Currently at [facility]. Has multiple co-morbidities COPD, CHF, DM, HTN, malnutrition. Please assess need for [facility] LCSW as daughter is in process of applying for CAPS. Please see liaison notes. Please call with questions.Raiford Noble, MSN-Ed, [facility] Liaison-[phone]   Reason for  consult:  Please assign to [facility] RNCM   Diagnoses of:   COPD/ Pneumonia Diabetes Heart Failure     Expected date of contact:  1-3 days (reserved for hospital discharges)   Diet - low sodium heart healthy    Complete by:  As directed    Increase activity slowly     Complete by:  As directed      Current Discharge Medication List    START taking these medications   Details  ipratropium-albuterol (DUONEB) 0.5-2.5 (3) MG/3ML SOLN Take 3 mLs by nebulization every 2 (two) hours as needed.      CONTINUE these medications which have CHANGED   Details  benazepril (LOTENSIN) 10 MG tablet Take 1 tablet (10 mg total) by mouth daily.    furosemide (LASIX) 20 MG tablet Take 2 tablets (40 mg total) by mouth daily.    predniSONE (DELTASONE) 20 MG tablet Take 40mg  for 2days then 20mg  for 2days then 10mg  for 2days then STOP      CONTINUE these medications which have NOT CHANGED   Details  acetaminophen (TYLENOL) 500 MG tablet Take 1,000 mg by mouth every 6 (six) hours as needed for pain.    albuterol (VENTOLIN HFA) 108 (90 Base) MCG/ACT inhaler Inhale 2 puffs into the lungs every 6 (six) hours as needed for wheezing or shortness of breath. Qty: 1 Inhaler, Refills: 5    amLODipine (NORVASC) 5 MG tablet Take 1 tablet (5 mg total) by mouth daily. Qty: 30 tablet, Refills: 6   Associated Diagnoses: HTN (hypertension), malignant; Cardiomyopathy, ischemic; Chronic diastolic heart failure (HCC)    aspirin EC 81 MG tablet Take 81 mg by mouth daily.    atorvastatin (LIPITOR) 10 MG tablet Take 1 tablet (10 mg total) by mouth daily at 6 PM. Qty: 90 tablet, Refills: 3    budesonide-formoterol (SYMBICORT) 160-4.5 MCG/ACT inhaler Inhale 2 puffs into the lungs 2 (two) times daily. Qty: 2 Inhaler, Refills: 0    carvedilol (COREG) 6.25 MG tablet Take 1 tablet (6.25 mg total) by mouth 2 (two) times daily with a meal. Qty: 180 tablet, Refills: 3    digoxin (DIGOX) 0.125 MG tablet Take 0.5 tablets (0.0625 mg total) by mouth daily. Qty: 30 tablet, Refills: 6    fluticasone (FLONASE) 50 MCG/ACT nasal spray Place 1 spray into both nostrils daily as needed for allergies. Refills: 3    glimepiride (AMARYL) 2 MG tablet Take 2 mg by mouth daily before breakfast.       metFORMIN (GLUCOPHAGE-XR) 500 MG 24 hr tablet Take 1,000 mg by mouth 2 (two) times daily.  Refills: 5      STOP taking these medications     nystatin (MYCOSTATIN) 100000 UNIT/ML suspension        No Known Allergies  Contact information for follow-up providers    [name], MD Follow up.   Specialty:  Family Medicine Contact information: [address] [place] [address] [phone]            Contact information for after-discharge care    Destination    [facility] Follow up.   Specialty:  Skilled Nursing Facility Contact information: [address] [place] [address] [phone]                   The results of significant diagnostics from this hospitalization (including imaging, microbiology, ancillary and laboratory) are listed below for reference.    Significant Diagnostic Studies: Dg Chest 2 View  Result Date: 07/26/2016 CLINICAL DATA:  Shortness of breath. EXAM: CHEST  2 VIEW COMPARISON:  04/23/2016.  05/26/2015. FINDINGS: Mediastinum and hilar structures are normal. Cardiomegaly with normal pulmonary vascularity. Mild lingular subsegmental atelectasis, best identified on lateral view. This is new from prior exams. Stable mild basal pleural thickening noted consistent scarring. No pneumothorax. Heart size stable. Degenerative changes and scoliosis thoracic spine . IMPRESSION: Mild lingular atelectasis, best identified on lateral view. No other focal abnormality identified. Exam otherwise stable from prior exams. Electronically Signed   By: Maisie Fus  Register   On: 07/26/2016 12:39   Ct Angio Chest Pe W Or Wo Contrast  Result Date: 07/27/2016 CLINICAL DATA:  Hypoxia, shortness of breath EXAM: CT ANGIOGRAPHY CHEST WITH CONTRAST TECHNIQUE: Multidetector CT imaging of the chest was performed using the standard protocol during bolus administration of intravenous contrast. Multiplanar CT image reconstructions and  MIPs were obtained to evaluate the vascular anatomy. CONTRAST:  Seven 5 mL Isovue 370 COMPARISON:  None. FINDINGS: Cardiovascular: Satisfactory opacification of the pulmonary arteries to the segmental level. No evidence of pulmonary embolism. Normal heart size. No pericardial effusion. Thoracic aortic atherosclerosis. No thoracic aortic aneurysm. Mediastinum/Nodes: No enlarged mediastinal, hilar, or axillary lymph nodes. Thyroid gland, trachea, and esophagus demonstrate no significant findings. Lungs/Pleura: Lungs are clear. Bilateral centrilobular emphysema. No pneumothorax. Small left pleural effusion. Trace right pleural effusion. Upper Abdomen: No acute abdominal abnormality. Musculoskeletal: No acute osseous abnormality. No lytic or sclerotic osseous lesion. Review of the MIP images confirms the above findings. IMPRESSION: 1. No evidence of pulmonary embolus. 2.  Emphysema. (ICD10-J43.9) 3. Small left and trace right pleural effusion. Electronically Signed   By: Elige Ko   On: 07/27/2016 16:11    Microbiology: Recent Results (from the past 240 hour(s))  MRSA PCR Screening     Status: None   Collection Time: 07/27/16 11:50 AM  Result Value Ref Range Status   MRSA by PCR NEGATIVE NEGATIVE Final    Comment:        The GeneXpert MRSA Assay (FDA approved for NASAL specimens only), is one component of a comprehensive MRSA colonization surveillance program. It is not intended to diagnose MRSA infection nor to guide or monitor treatment for MRSA infections.      Labs: Basic Metabolic Panel:  Recent Labs Lab 07/27/16 0530 07/29/16 0343 07/29/16 0847 07/30/16 0408  NA 128* 131*  --  132*  K 4.6 4.2  --  4.3  CL 86* 88*  --  87*  CO2 31 33*  --  36*  GLUCOSE 188* 69 36* 269*  BUN 27* 38*  --  32*  CREATININE 1.16* 0.81  --  0.77  CALCIUM 9.0 9.3  --  9.3   Liver Function Tests: No results for input(s): AST, ALT, ALKPHOS, BILITOT, PROT, ALBUMIN in the last 168 hours. No results  for input(s): LIPASE, AMYLASE in the last 168 hours. No results for input(s): AMMONIA in the last 168 hours. CBC:  Recent Labs Lab 07/27/16 0530 07/29/16 0343 07/30/16 0408  WBC 13.4* 13.0* 12.0*  HGB 10.9* 10.8* 10.6*  HCT 33.6* 33.0* 32.9*  MCV 93.6 90.9 92.2  PLT 219 255 263   Cardiac Enzymes: No results for input(s): CKTOTAL, CKMB, CKMBINDEX, TROPONINI in the last 168 hours. BNP: BNP (last 3 results)  Recent Labs  04/23/16 1716 07/26/16 1222  BNP 72.4 163.1*    ProBNP (last 3 results) No results for input(s): PROBNP in the last 8760 hours.  CBG:  Recent Labs Lab  08/01/16 1117 08/01/16 1543 08/01/16 2131 08/02/16 0734 08/02/16 1114  GLUCAP 296* 246* 303* 105* 270*       SignedZannie Cove MD.  [facility] 08/02/2016, 2:48 PM

## 2016-08-02 NOTE — Consult Note (Signed)
   [facility] CM Inpatient Consult   08/02/2016  [name] August 11, 1929 [id number]    [facility] Care Management follow up. Spoke with patient and daughter [name] at bedside who states [name] will be discharging to [facility] today. Will make referral to [facility] LCSW for follow up while at [facility].   Raiford Noble, MSN-Ed, RN,BSN [facility] Liaison [phone]

## 2016-08-02 NOTE — Progress Notes (Signed)
Inpatient Diabetes Program Recommendations  AACE/ADA: New Consensus Statement on Inpatient Glycemic Control (2015)  Target Ranges:  Prepandial:   less than 140 mg/dL      Peak postprandial:   less than 180 mg/dL (1-2 hours)      Critically ill patients:  140 - 180 mg/dL   Results for HELLENA, DUNNER (MRN [id number]) as of 08/02/2016 10:55  Ref. Range 08/01/2016 07:50 08/01/2016 11:17 08/01/2016 15:43 08/01/2016 21:31 08/02/2016 07:34  Glucose-Capillary Latest Ref Range: 65 - 99 mg/dL 401 (H) 027 (H) 253 (H) 303 (H) 105 (H)    Review of Glycemic Control  Current orders for Inpatient glycemic control: Lantus 10 units QHS, Novolog 0-20 units TID with meals, Novolog 0-5 units QHS  Inpatient Diabetes Program Recommendations: Insulin - Meal Coverage: Post prandial glucose is consistently elevated. Please consider ordering Novolog 4 units TID with meals for meal coverage if patient eats at least 50% of meals.  Thanks, Orlando Penner, RN, MSN, CDE Diabetes Coordinator Inpatient Diabetes Program [phone] (Team Pager from 8am to 5pm)

## 2016-08-02 NOTE — Progress Notes (Signed)
Pt discharge to [facility]. Discharge instructions were given. Report given to Charlyne Petrin.

## 2016-08-02 NOTE — Progress Notes (Signed)
Clinical Social Worker facilitated patient discharge including contacting patient family and facility to confirm patient discharge plans.  Clinical information faxed to facility and family agreeable with plan.  CSW arranged ambulance transport via [facility] to [facility] .  RN to call [phone] (rm is 52) for report prior to discharge.  Clinical Social Worker will sign off for now as social work intervention is no longer needed. Please consult [place] again if new need arises.  Marrianne Mood, MSW, [employer] [phone]

## 2016-08-02 NOTE — Care Management Note (Signed)
Case Management Note  Patient Details  Name: [name] MRN: [id number] Date of Birth: [birth year]  Subjective/Objective:  Pt presented for Acute on Chronic Respiratory Failure. Plan for d/c to SNF today. CSW assisting with disposition needs.                   Action/Plan: Pt to d/c to [facility] 08-02-16. No further needs from CM at this time.   Expected Discharge Date:  08/02/16               Expected Discharge Plan:  Skilled Nursing Facility  In-House Referral:  Clinical Social Work  Discharge planning Services  CM Consult  Post Acute Care Choice:  NA Choice offered to:  NA  DME Arranged:  N/A DME Agency:  NA  HH Arranged:  NA HH Agency:  NA  Status of Service:  Completed, signed off  If discussed at [employer] of Stay Meetings, dates discussed:    Additional Comments:  Gala Lewandowsky, RN 08/02/2016, 2:33 PM

## 2016-08-02 NOTE — Care Management Important Message (Signed)
Important Message  Patient Details  Name: [name] MRN: [id number] Date of Birth: 1929-10-03   Medicare Important Message Given:  Yes    Lonald Troiani Abena 08/02/2016, 11:50 AM

## 2016-08-03 ENCOUNTER — Non-Acute Institutional Stay (SKILLED_NURSING_FACILITY): Payer: Medicare Other | Admitting: Adult Health

## 2016-08-03 ENCOUNTER — Encounter: Payer: Self-pay | Admitting: Adult Health

## 2016-08-03 DIAGNOSIS — J309 Allergic rhinitis, unspecified: Secondary | ICD-10-CM

## 2016-08-03 DIAGNOSIS — E785 Hyperlipidemia, unspecified: Secondary | ICD-10-CM | POA: Diagnosis not present

## 2016-08-03 DIAGNOSIS — R531 Weakness: Secondary | ICD-10-CM

## 2016-08-03 DIAGNOSIS — E139 Other specified diabetes mellitus without complications: Secondary | ICD-10-CM | POA: Diagnosis not present

## 2016-08-03 DIAGNOSIS — I5033 Acute on chronic diastolic (congestive) heart failure: Secondary | ICD-10-CM

## 2016-08-03 DIAGNOSIS — J9621 Acute and chronic respiratory failure with hypoxia: Secondary | ICD-10-CM

## 2016-08-03 DIAGNOSIS — I10 Essential (primary) hypertension: Secondary | ICD-10-CM | POA: Diagnosis not present

## 2016-08-03 NOTE — Progress Notes (Signed)
DATE:  08/03/2016   MRN:  [id number]  BIRTHDAY: 1929-10-24  Facility:  Nursing Home Location:  [facility]  Nursing Home Room Number: [id number]  LEVEL OF CARE:  SNF [phone])  Contact Information    Name Relation Home Work [place] Daughter [phone]  [phone]   Selden,Chris Son   [phone]       Code Status History    Date Active Date Inactive Code Status Order ID Comments User Context   07/26/2016  6:30 PM 08/02/2016  7:17 PM Full Code [id number]  Alison Murray, MD Inpatient   11/08/2011  5:58 PM 11/10/2011  1:48 PM Full Code [id number]  Jack Quarto, RN Inpatient       Chief Complaint  Patient presents with  . Hospitalization Follow-up    HISTORY OF PRESENT ILLNESS:  This is an 35-YO female seen for hospital follow-up.  She was admitted to [facility] on 08/02/2016 following an admission at [facility] 07/26/2016-08/02/2016 due to worsening dyspnea and cough.  She was recently given O2 @ 3L/min via [place] @ home, Prednisone and antibiotic. She was treated for COPD with acute exacerbation with IV Solumedrol, antibiotic and nebulization. She was also treated for acute on chronic diastolic heart failure. Lasix was increased to 40 mg daily and will follow-up with cardiology as outpatient.    PAST MEDICAL HISTORY:  Past Medical History:  Diagnosis Date  . Acute bronchitis   . Acute on chronic respiratory failure with hypoxia (HCC)   . Benign hypertensive renal disease   . CAD (coronary artery disease)   . Cardiomyopathy    non-ischemic 04/2008 EF 45% cardiac MRI no scar  . CHF (congestive heart failure) (HCC)   . Chronic sinusitis   . COPD (chronic obstructive pulmonary disease) (HCC)   . COPD exacerbation (HCC)   . Depression   . Diabetes mellitus without complication, without long-term current use of insulin (HCC)   . Dyslipidemia associated with type 2 diabetes mellitus (HCC)   . Dyspnea on exertion    chronic  .  Hypercholesterolemia   . Hypertension   . On home oxygen therapy    "3L; 24/7; for the last 2 1/2 weeks; none before that" (07/26/2016)  . Protein-calorie malnutrition, severe (HCC)   . Sinus headache    "chronic"  . Stroke [facility]) 1997   "mild"  . Type II diabetes mellitus (HCC)      CURRENT MEDICATIONS: Reviewed  Patient's Medications  New Prescriptions   No medications on file  Previous Medications   ACETAMINOPHEN (TYLENOL) 500 MG TABLET    Take 1,000 mg by mouth every 6 (six) hours as needed for pain.   ALBUTEROL (VENTOLIN HFA) 108 (90 BASE) MCG/ACT INHALER    Inhale 2 puffs into the lungs every 6 (six) hours as needed for wheezing or shortness of breath.   AMLODIPINE (NORVASC) 5 MG TABLET    Take 1 tablet (5 mg total) by mouth daily.   ASPIRIN EC 81 MG TABLET    Take 81 mg by mouth daily.   ATORVASTATIN (LIPITOR) 10 MG TABLET    Take 1 tablet (10 mg total) by mouth daily at 6 PM.   BENAZEPRIL (LOTENSIN) 10 MG TABLET    Take 1 tablet (10 mg total) by mouth daily.   BUDESONIDE-FORMOTEROL (SYMBICORT) 160-4.5 MCG/ACT INHALER    Inhale 2 puffs into the lungs 2 (two) times daily.   CARVEDILOL (COREG) 6.25 MG TABLET  Take 1 tablet (6.25 mg total) by mouth 2 (two) times daily with a meal.   DIGOXIN (DIGOX) 0.125 MG TABLET    Take 0.5 tablets (0.0625 mg total) by mouth daily.   FLUTICASONE (FLONASE) 50 MCG/ACT NASAL SPRAY    Place 1 spray into both nostrils daily as needed for allergies.   FUROSEMIDE (LASIX) 20 MG TABLET    Take 2 tablets (40 mg total) by mouth daily.   GLIMEPIRIDE (AMARYL) 2 MG TABLET    Take 2 mg by mouth daily before breakfast.     IPRATROPIUM-ALBUTEROL (DUONEB) 0.5-2.5 (3) MG/3ML SOLN    Take 3 mLs by nebulization every 2 (two) hours as needed.   METFORMIN (GLUCOPHAGE-XR) 500 MG 24 HR TABLET    Take 1,000 mg by mouth 2 (two) times daily.    PREDNISONE (DELTASONE) 20 MG TABLET    Take 40mg  for 2days then 20mg  for 2days then 10mg  for 2days then STOP  Modified Medications    No medications on file  Discontinued Medications   No medications on file     No Known Allergies   REVIEW OF SYSTEMS:  GENERAL: no change in appetite, no fatigue, no weight changes, no fever, chills or weakness EYES: Denies change in vision, dry eyes, eye pain, itching or discharge EARS: Denies change in hearing, ringing in ears, or earache NOSE: Denies nasal congestion or epistaxis MOUTH and THROAT: Denies oral discomfort, gingival pain or bleeding, pain from teeth or hoarseness   RESPIRATORY: no cough, SOB, DOE, wheezing, hemoptysis CARDIAC: no chest pain, edema or palpitations GI: no abdominal pain, diarrhea, constipation, heart burn, nausea or vomiting GU: Denies dysuria, frequency, hematuria, incontinence, or discharge PSYCHIATRIC: Denies feeling of depression or anxiety. No report of hallucinations, insomnia, paranoia, or agitation     PHYSICAL EXAMINATION  GENERAL APPEARANCE: Well nourished. In no acute distress. Normal body habitus SKIN:  Skin is warm and dry.  HEAD: Normal in size and contour. No evidence of trauma EYES: Lids open and close normally. No blepharitis, entropion or ectropion. PERRL. Conjunctivae are clear and sclerae are white. Lenses are without opacity EARS: Pinnae are normal. Patient hears normal voice tunes of the examiner MOUTH and THROAT: Lips are without lesions. Oral mucosa is moist and without lesions. Tongue is normal in shape, size, and color and without lesions NECK: supple, trachea midline, no neck masses, no thyroid tenderness, no thyromegaly LYMPHATICS: no LAN in the neck, no supraclavicular LAN RESPIRATORY: breathing is even & unlabored, BS CTAB CARDIAC: Irregularly irregular he is movement is normal: Move all is or as follows all, no murmur,no extra heart sounds, no edema GI: abdomen soft, normal BS, no masses, no tenderness, no hepatomegaly, no splenomegaly EXTREMITIES:  Able to move X 4 is extremities PSYCHIATRIC: Alert to self and  place, disoriented to time. Affect and behavior are appropriate   LABS/RADIOLOGY: Labs reviewed: Basic Metabolic Panel:  Recent Labs  16/10/96 0530 07/29/16 0343 07/29/16 0847 07/30/16 0408  NA 128* 131*  --  132*  K 4.6 4.2  --  4.3  CL 86* 88*  --  87*  CO2 31 33*  --  36*  GLUCOSE 188* 69 36* 269*  BUN 27* 38*  --  32*  CREATININE 1.16* 0.81  --  0.77  CALCIUM 9.0 9.3  --  9.3   Liver Function Tests:  Recent Labs  05/24/16 0924  AST 19  ALT 13  ALKPHOS 49  BILITOT 0.5  PROT 7.1  ALBUMIN 4.5  CBC:  Recent Labs  07/26/16 1211 07/27/16 0530 07/29/16 0343 07/30/16 0408  WBC 15.1* 13.4* 13.0* 12.0*  NEUTROABS 11.9*  --   --   --   HGB 11.9* 10.9* 10.8* 10.6*  HCT 37.0 33.6* 33.0* 32.9*  MCV 94.9 93.6 90.9 92.2  PLT 228 219 255 263   Lipid Panel:  Recent Labs  05/24/16 0924  HDL 65   CBG:  Recent Labs  08/01/16 2131 08/02/16 0734 08/02/16 1114  GLUCAP 303* 105* 270*      Dg Chest 2 View  Result Date: 07/26/2016 CLINICAL DATA:  Shortness of breath. EXAM: CHEST  2 VIEW COMPARISON:  04/23/2016.  05/26/2015. FINDINGS: Mediastinum and hilar structures are normal. Cardiomegaly with normal pulmonary vascularity. Mild lingular subsegmental atelectasis, best identified on lateral view. This is new from prior exams. Stable mild basal pleural thickening noted consistent scarring. No pneumothorax. Heart size stable. Degenerative changes and scoliosis thoracic spine . IMPRESSION: Mild lingular atelectasis, best identified on lateral view. No other focal abnormality identified. Exam otherwise stable from prior exams. Electronically Signed   By: Maisie Fus  Register   On: 07/26/2016 12:39   Ct Angio Chest Pe W Or Wo Contrast  Result Date: 07/27/2016 CLINICAL DATA:  Hypoxia, shortness of breath EXAM: CT ANGIOGRAPHY CHEST WITH CONTRAST TECHNIQUE: Multidetector CT imaging of the chest was performed using the standard protocol during bolus administration of intravenous  contrast. Multiplanar CT image reconstructions and MIPs were obtained to evaluate the vascular anatomy. CONTRAST:  Seven 5 mL Isovue 370 COMPARISON:  None. FINDINGS: Cardiovascular: Satisfactory opacification of the pulmonary arteries to the segmental level. No evidence of pulmonary embolism. Normal heart size. No pericardial effusion. Thoracic aortic atherosclerosis. No thoracic aortic aneurysm. Mediastinum/Nodes: No enlarged mediastinal, hilar, or axillary lymph nodes. Thyroid gland, trachea, and esophagus demonstrate no significant findings. Lungs/Pleura: Lungs are clear. Bilateral centrilobular emphysema. No pneumothorax. Small left pleural effusion. Trace right pleural effusion. Upper Abdomen: No acute abdominal abnormality. Musculoskeletal: No acute osseous abnormality. No lytic or sclerotic osseous lesion. Review of the MIP images confirms the above findings. IMPRESSION: 1. No evidence of pulmonary embolus. 2.  Emphysema. (ICD10-J43.9) 3. Small left and trace right pleural effusion. Electronically Signed   By: Elige Ko   On: 07/27/2016 16:11    ASSESSMENT/PLAN:  Generalized weakness - for rehabilitation, PT and OT, for therapeutic strengthening exercises; fall precautions  Acute on chronic respiratory failure with hypoxia - continue O2 at 3 L/minute via [place]; treated with IV Solu-Medrol,  Antibiotic and nebs; continue Symbicort 160-4.5 g inhaler inhale 2 puffs into lungs twice a day, Ventolin HFA 90 g inhaler inhale 2 puffs into the lungs every 6 hours when necessary, ipratropium albuterol 0.5-2.5 (3) mg/ML take 3 mL by nebulization every 2 hours when necessary and prednisone taper  Acute on chronic diastolic heart failure - EF 45%; Lasix was increased from 20 mg to 40 mg by mouth daily, continue digoxin 0.125 mg 1/2 tab = 0.0625 mg 1 tab PO Q D; follow-up with cardiology; check digoxin level, weigh Q Monday-Wednesdays-Fridays  Essential hypertension - continue Norvasc 5 mg 1 tab by mouth  daily, Coreg 6.25 mg 1 tab by mouth daily and Lotensin 10 mg 1 tab by mouth daily   Hyperlipidemia - continue Lipitor 10 mg 1 tab by mouth daily  Diabetes mellitus, type II - continue metformin 1000 mg 1 tab by mouth twice a day and Glimepiride 2 mg 1 tab by mouth daily, CBG twice a day Lab Results  Component Value Date   HGBA1C 6.9 (H) 07/31/2016     Allergic rhinitis - continue Flonase 50 g/ACT Place 1 spray into both nostrils when necessary     Goals of care:  Short-term rehabilitation   Vanessa Kampf C. Medina-Vargas - NP    [employer] [phone]

## 2016-08-04 ENCOUNTER — Non-Acute Institutional Stay (SKILLED_NURSING_FACILITY): Payer: Medicare Other | Admitting: Internal Medicine

## 2016-08-04 ENCOUNTER — Encounter: Payer: Self-pay | Admitting: Internal Medicine

## 2016-08-04 DIAGNOSIS — R4189 Other symptoms and signs involving cognitive functions and awareness: Secondary | ICD-10-CM | POA: Diagnosis not present

## 2016-08-04 DIAGNOSIS — J441 Chronic obstructive pulmonary disease with (acute) exacerbation: Secondary | ICD-10-CM | POA: Diagnosis not present

## 2016-08-04 DIAGNOSIS — E1169 Type 2 diabetes mellitus with other specified complication: Secondary | ICD-10-CM | POA: Diagnosis not present

## 2016-08-04 DIAGNOSIS — D72829 Elevated white blood cell count, unspecified: Secondary | ICD-10-CM

## 2016-08-04 DIAGNOSIS — E43 Unspecified severe protein-calorie malnutrition: Secondary | ICD-10-CM | POA: Diagnosis not present

## 2016-08-04 DIAGNOSIS — E139 Other specified diabetes mellitus without complications: Secondary | ICD-10-CM | POA: Diagnosis not present

## 2016-08-04 DIAGNOSIS — R5381 Other malaise: Secondary | ICD-10-CM | POA: Diagnosis not present

## 2016-08-04 DIAGNOSIS — I10 Essential (primary) hypertension: Secondary | ICD-10-CM

## 2016-08-04 DIAGNOSIS — I5032 Chronic diastolic (congestive) heart failure: Secondary | ICD-10-CM

## 2016-08-04 DIAGNOSIS — E785 Hyperlipidemia, unspecified: Secondary | ICD-10-CM

## 2016-08-04 DIAGNOSIS — D638 Anemia in other chronic diseases classified elsewhere: Secondary | ICD-10-CM

## 2016-08-04 LAB — CBC AND DIFFERENTIAL
HCT: 39 % (ref 36–46)
Hemoglobin: 12.7 g/dL (ref 12.0–16.0)
Neutrophils Absolute: 9 /uL
Platelets: 240 10*3/uL (ref 150–399)
WBC: 13 10^3/mL

## 2016-08-04 LAB — BASIC METABOLIC PANEL
BUN: 21 mg/dL (ref 4–21)
Creatinine: 0.6 mg/dL (ref 0.5–1.1)
Glucose: 160 mg/dL
POTASSIUM: 4.8 mmol/L (ref 3.4–5.3)
SODIUM: 135 mmol/L — AB (ref 137–147)

## 2016-08-04 NOTE — Progress Notes (Signed)
LOCATION: [facility]  PCP: [name], MD   Code Status: Full Code  Goals of care: Advanced Directive information Advanced Directives 07/26/2016  Does Patient Have a Medical Advance Directive? No  Copy of Healthcare Power of Attorney in Chart? -  Would patient like information on creating a medical advance directive? No - Patient declined       Extended Emergency Contact Information Primary Emergency Contact: Miller,[name] Address: [address]          [place], [place] Home Phone: [phone] Mobile Phone: [phone] Relation: Daughter Secondary Emergency Contact: Abe,Chris  [place] Mobile Phone: [phone] Relation: Son   No Known Allergies  Chief Complaint  Patient presents with  . New Admit To SNF    New Admission Visit      HPI:  Patient is a 83 y.o. female seen today for short term rehabilitation post hospital admission from 07/26/16-08/02/16 with acute on chronic respiratory failure with copd exacerbation and CHF exacerbation. She received iv solumedrol, antibiotics, BiPAP and nebulizer treatment. She is seen in her room today. She has medical history of COPD, chronic diastolic heart failure, hypertension, diabetes mellitus, hyperlipidemia.   Review of Systems:  Constitutional: Negative for fever, chills, diaphoresis. Energy level is fair HENT: Negative for headache, congestion, nasal discharge, sore throat, difficulty swallowing.   Eyes: Negative for eye pain, blurred vision, double vision and discharge.  Respiratory: Negative for shortness of breath and wheezing. Positive for occasional cough.  Cardiovascular: Negative for chest pain, palpitations, leg swelling.  Gastrointestinal: Negative for heartburn, nausea, vomiting, abdominal pain,melena, diarrhea and constipation. Positive for poor appetite. Last bowel movement was this morning. Genitourinary: Negative for dysuria  Musculoskeletal: Negative  for back pain, fall in the facility.  Skin: Negative for itching, rash.  Neurological: Negative for dizziness. Psychiatric/Behavioral: Negative for depression   Past Medical History:  Diagnosis Date  . Acute bronchitis   . Acute on chronic respiratory failure with hypoxia (HCC)   . Benign hypertensive renal disease   . CAD (coronary artery disease)   . Cardiomyopathy    non-ischemic 04/2008 EF 45% cardiac MRI no scar  . CHF (congestive heart failure) (HCC)   . Chronic sinusitis   . COPD (chronic obstructive pulmonary disease) (HCC)   . COPD exacerbation (HCC)   . Depression   . Diabetes mellitus without complication, without long-term current use of insulin (HCC)   . Dyslipidemia associated with type 2 diabetes mellitus (HCC)   . Dyspnea on exertion    chronic  . Hypercholesterolemia   . Hypertension   . On home oxygen therapy    "3L; 24/7; for the last 2 1/2 weeks; none before that" (07/26/2016)  . Protein-calorie malnutrition, severe (HCC)   . Sinus headache    "chronic"  . Stroke [facility]) 1997   "mild"  . Type II diabetes mellitus (HCC)    Past Surgical History:  Procedure Laterality Date  . carotid angiogram     2006  . ESOPHAGOGASTRODUODENOSCOPY     with foreign body removal  . NASAL SINUS SURGERY  ~1974;~ 1976  . TONSILLECTOMY     "when I was a chld"   Social History:   reports that she has been smoking Cigarettes.  She has a 52.50 pack-year smoking history. She has never used smokeless tobacco. She reports that she does not drink alcohol or use drugs.  Family History  Problem Relation Age of Onset  . Heart disease Father   .  Alcohol abuse Sister   . Alcohol abuse Brother   . Coronary artery disease      family history    Medications: Allergies as of 08/04/2016   No Known Allergies     Medication List       Accurate as of 08/04/16 12:39 PM. Always use your most recent med list.          acetaminophen 500 MG tablet Commonly known as:  TYLENOL Take  1,000 mg by mouth every 6 (six) hours as needed for pain.   albuterol 108 (90 Base) MCG/ACT inhaler Commonly known as:  VENTOLIN HFA Inhale 2 puffs into the lungs every 6 (six) hours as needed for wheezing or shortness of breath.   amLODipine 5 MG tablet Commonly known as:  NORVASC Take 1 tablet (5 mg total) by mouth daily.   aspirin EC 81 MG tablet Take 81 mg by mouth daily.   atorvastatin 10 MG tablet Commonly known as:  LIPITOR Take 1 tablet (10 mg total) by mouth daily at 6 PM.   benazepril 10 MG tablet Commonly known as:  LOTENSIN Take 1 tablet (10 mg total) by mouth daily.   budesonide-formoterol 160-4.5 MCG/ACT inhaler Commonly known as:  SYMBICORT Inhale 2 puffs into the lungs 2 (two) times daily.   carvedilol 6.25 MG tablet Commonly known as:  COREG Take 1 tablet (6.25 mg total) by mouth 2 (two) times daily with a meal.   digoxin 0.125 MG tablet Commonly known as:  DIGOX Take 0.5 tablets (0.0625 mg total) by mouth daily.   fluticasone 50 MCG/ACT nasal spray Commonly known as:  FLONASE Place 1 spray into both nostrils daily as needed for allergies.   furosemide 20 MG tablet Commonly known as:  LASIX Take 2 tablets (40 mg total) by mouth daily.   glimepiride 2 MG tablet Commonly known as:  AMARYL Take 2 mg by mouth daily before breakfast.   ipratropium-albuterol 0.5-2.5 (3) MG/3ML Soln Commonly known as:  DUONEB Take 3 mLs by nebulization every 2 (two) hours as needed.   metFORMIN 500 MG 24 hr tablet Commonly known as:  GLUCOPHAGE-XR Take 1,000 mg by mouth 2 (two) times daily.   predniSONE 20 MG tablet Commonly known as:  DELTASONE Take 20 mg by mouth daily. Stop date 08/06/16   predniSONE 10 MG tablet Commonly known as:  DELTASONE Take 10 mg by mouth daily. Start on 08/07/16. Stop date 08/08/16   UNABLE TO FIND Med Name: Med pass 120 cc by mouth 2 times daily       Immunizations: Immunization History  Administered Date(s) Administered  .  Influenza,inj,Quad PF,36+ Mos 01/24/2015, 01/24/2016  . Pneumococcal-Unspecified 12/25/2014     Physical Exam: Vitals:   08/04/16 1228  BP: 123/69  Pulse: 80  Resp: 18  Temp: (!) 96.9 F (36.1 C)  TempSrc: Oral  SpO2: 99%  Weight: 83 lb 6.4 oz (37.8 kg)  Height: 5\' 2"  (1.575 m)   Body mass index is 15.25 kg/m.  General- elderly female, Frail and thin built, in no acute distress Head- normocephalic, atraumatic Nose- no nasal discharge Throat- moist mucus membrane, normal oropharynx, has dentures Eyes- PERRLA, EOMI, no pallor, no icterus, no discharge, normal conjunctiva, normal sclera Neck- no cervical lymphadenopathy Cardiovascular- normal s1,s2, no murmur Respiratory- bilateral poor air movement auscultation, no wheeze, no rhonchi, no crackles, no use of accessory muscles, on 3 L oxygen by nasal cannula Abdomen- bowel sounds present, soft, non tender, no guarding or rigidity, Musculoskeletal- able to  move all 4 extremities, generalized weakness, no leg edema Neurological- alert and oriented to self and place but not to time and person  Skin- warm and dry Psychiatry- normal mood and affect    Labs reviewed: Basic Metabolic Panel:  Recent Labs  16/10/96 0530 07/29/16 0343 07/29/16 0847 07/30/16 0408  NA 128* 131*  --  132*  K 4.6 4.2  --  4.3  CL 86* 88*  --  87*  CO2 31 33*  --  36*  GLUCOSE 188* 69 36* 269*  BUN 27* 38*  --  32*  CREATININE 1.16* 0.81  --  0.77  CALCIUM 9.0 9.3  --  9.3   Liver Function Tests:  Recent Labs  05/24/16 0924  AST 19  ALT 13  ALKPHOS 49  BILITOT 0.5  PROT 7.1  ALBUMIN 4.5   No results for input(s): LIPASE, AMYLASE in the last 8760 hours. No results for input(s): AMMONIA in the last 8760 hours. CBC:  Recent Labs  07/26/16 1211 07/27/16 0530 07/29/16 0343 07/30/16 0408  WBC 15.1* 13.4* 13.0* 12.0*  NEUTROABS 11.9*  --   --   --   HGB 11.9* 10.9* 10.8* 10.6*  HCT 37.0 33.6* 33.0* 32.9*  MCV 94.9 93.6 90.9 92.2   PLT 228 219 255 263   Cardiac Enzymes: No results for input(s): CKTOTAL, CKMB, CKMBINDEX, TROPONINI in the last 8760 hours. BNP: Invalid input(s): POCBNP CBG:  Recent Labs  08/01/16 2131 08/02/16 0734 08/02/16 1114  GLUCAP 303* 105* 270*    Radiological Exams: Dg Chest 2 View  Result Date: 07/26/2016 CLINICAL DATA:  Shortness of breath. EXAM: CHEST  2 VIEW COMPARISON:  04/23/2016.  05/26/2015. FINDINGS: Mediastinum and hilar structures are normal. Cardiomegaly with normal pulmonary vascularity. Mild lingular subsegmental atelectasis, best identified on lateral view. This is new from prior exams. Stable mild basal pleural thickening noted consistent scarring. No pneumothorax. Heart size stable. Degenerative changes and scoliosis thoracic spine . IMPRESSION: Mild lingular atelectasis, best identified on lateral view. No other focal abnormality identified. Exam otherwise stable from prior exams. Electronically Signed   By: Maisie Fus  Register   On: 07/26/2016 12:39   Ct Angio Chest Pe W Or Wo Contrast  Result Date: 07/27/2016 CLINICAL DATA:  Hypoxia, shortness of breath EXAM: CT ANGIOGRAPHY CHEST WITH CONTRAST TECHNIQUE: Multidetector CT imaging of the chest was performed using the standard protocol during bolus administration of intravenous contrast. Multiplanar CT image reconstructions and MIPs were obtained to evaluate the vascular anatomy. CONTRAST:  Seven 5 mL Isovue 370 COMPARISON:  None. FINDINGS: Cardiovascular: Satisfactory opacification of the pulmonary arteries to the segmental level. No evidence of pulmonary embolism. Normal heart size. No pericardial effusion. Thoracic aortic atherosclerosis. No thoracic aortic aneurysm. Mediastinum/Nodes: No enlarged mediastinal, hilar, or axillary lymph nodes. Thyroid gland, trachea, and esophagus demonstrate no significant findings. Lungs/Pleura: Lungs are clear. Bilateral centrilobular emphysema. No pneumothorax. Small left pleural effusion. Trace  right pleural effusion. Upper Abdomen: No acute abdominal abnormality. Musculoskeletal: No acute osseous abnormality. No lytic or sclerotic osseous lesion. Review of the MIP images confirms the above findings. IMPRESSION: 1. No evidence of pulmonary embolus. 2.  Emphysema. (ICD10-J43.9) 3. Small left and trace right pleural effusion. Electronically Signed   By: Elige Ko   On: 07/27/2016 16:11    Assessment/Plan  Physical deconditioning With generalized weakness. Will have her work with physical therapy and occupational therapy team to help with gait training and muscle strengthening exercises.fall precautions. Skin care. Encourage to be out of  bed.   Acute COPD exacerbation Breathing currently stable. Continue and complete tapering course of prednisone on 08/08/2016. Continue oxygen by nasal cannula. Continue Symbicort 2 puffs twice a day, DuoNeb every 2 hours as needed and albuterol inhaler on a needed basis. She has completed her antibiotic course.  Leukocytosis With recent COPD exacerbation and being on prednisone. Monitor WBC count  Anemia of chronic disease Monitor cbc  Type 2 diabetes mellitus Lab Results  Component Value Date   HGBA1C 6.9 (H) 07/31/2016   Hemoglobin A1c is suggestive of controlled diabetes. Continue metformin thousand milligrams twice a day and glimepiride 2 mg daily. Monitor blood sugar reading. Continue aspirin, statin and ACE inhibitor.  Chronic diastolic congestive heart failure Recent chf exacerbation. EF 45%. Currently on Coreg 6.25 mg twice a day, benazepril 10 mg daily, digoxin 0.0625 mg daily, Lasix 40 mg daily, check BMP. Will need cardiology follow up.  Cognitive impairment Speech consult. Likely has a complement of vascular dementia given her age and her medical problems. Provide supportive care for now. Fall precautions to be taken.  Essential hypertension Monitor blood pressure reading, continue Norvasc 5 mg daily, Coreg 6.25 mg twice a day and  benazepril 10 mg daily. Check BMP.  Hyperlipidemia Continue atorvastatin 10 mg daily  Protein calorie malnutrition RD consult. Monitor weight and oral intake.    Goals of care: short term rehabilitation   Labs/tests ordered: cbc, bmp   Family/ staff Communication: reviewed care plan with patient and nursing supervisor    Oneal Grout, MD Internal Medicine [facility] [address] [place], [place] [address] Cell Phone (Monday-Friday 8 am - 5 pm): [phone] On Call: [phone] and follow prompts after 5 pm and on weekends Office Phone: [phone] Office Fax: [phone]

## 2016-08-11 ENCOUNTER — Ambulatory Visit: Payer: Medicare Other | Admitting: Pulmonary Disease

## 2016-08-12 DIAGNOSIS — E11622 Type 2 diabetes mellitus with other skin ulcer: Secondary | ICD-10-CM | POA: Diagnosis not present

## 2016-08-12 DIAGNOSIS — E43 Unspecified severe protein-calorie malnutrition: Secondary | ICD-10-CM | POA: Diagnosis not present

## 2016-08-12 DIAGNOSIS — L989 Disorder of the skin and subcutaneous tissue, unspecified: Secondary | ICD-10-CM | POA: Diagnosis not present

## 2016-08-12 DIAGNOSIS — L89213 Pressure ulcer of right hip, stage 3: Secondary | ICD-10-CM | POA: Diagnosis not present

## 2016-08-15 ENCOUNTER — Encounter: Payer: Self-pay | Admitting: Pulmonary Disease

## 2016-08-15 ENCOUNTER — Ambulatory Visit (INDEPENDENT_AMBULATORY_CARE_PROVIDER_SITE_OTHER): Payer: Medicare Other | Admitting: Pulmonary Disease

## 2016-08-15 VITALS — BP 90/62 | HR 77 | Temp 97.0°F | Ht 62.0 in | Wt 76.2 lb

## 2016-08-15 DIAGNOSIS — E43 Unspecified severe protein-calorie malnutrition: Secondary | ICD-10-CM | POA: Diagnosis not present

## 2016-08-15 DIAGNOSIS — I255 Ischemic cardiomyopathy: Secondary | ICD-10-CM

## 2016-08-15 DIAGNOSIS — R636 Underweight: Secondary | ICD-10-CM

## 2016-08-15 DIAGNOSIS — R531 Weakness: Secondary | ICD-10-CM | POA: Diagnosis not present

## 2016-08-15 DIAGNOSIS — J9621 Acute and chronic respiratory failure with hypoxia: Secondary | ICD-10-CM | POA: Diagnosis not present

## 2016-08-15 DIAGNOSIS — L89310 Pressure ulcer of right buttock, unstageable: Secondary | ICD-10-CM | POA: Diagnosis not present

## 2016-08-15 DIAGNOSIS — E11628 Type 2 diabetes mellitus with other skin complications: Secondary | ICD-10-CM | POA: Diagnosis not present

## 2016-08-15 DIAGNOSIS — E119 Type 2 diabetes mellitus without complications: Secondary | ICD-10-CM

## 2016-08-15 DIAGNOSIS — J449 Chronic obstructive pulmonary disease, unspecified: Secondary | ICD-10-CM | POA: Diagnosis not present

## 2016-08-15 DIAGNOSIS — S31010A Laceration without foreign body of lower back and pelvis without penetration into retroperitoneum, initial encounter: Secondary | ICD-10-CM | POA: Diagnosis not present

## 2016-08-15 DIAGNOSIS — I5032 Chronic diastolic (congestive) heart failure: Secondary | ICD-10-CM

## 2016-08-15 MED ORDER — BUDESONIDE-FORMOTEROL FUMARATE 160-4.5 MCG/ACT IN AERO: 2.0000 | INHALATION_SPRAY | Freq: Two times a day (BID) | RESPIRATORY_TRACT | 0 refills | Status: DC

## 2016-08-15 MED ORDER — UMECLIDINIUM BROMIDE 62.5 MCG/INH IN AEPB: 1.0000 | INHALATION_SPRAY | Freq: Every day | RESPIRATORY_TRACT | 0 refills | Status: DC

## 2016-08-15 NOTE — Progress Notes (Signed)
Subjective:     Patient ID: [name], female   DOB: October 06, 1929, 83 y.o.   MRN: [id number]  HPI  ~  May 27, 2015:  Initial pulmonary consult w/ SN>      10 y/o BF referred by DrCrossley for a pulmonary evaluation due to dyspnea> He saw her yest in his office for f/u sinus drainage & cough, treated w/ ZPak/ then Doxy, he did a CXR showing Emphysema & referred her here;  Her PCP is Dr. Selena Batten [place]/ Dr. Dorothyann Peng ([facility]);  Pt states that she "got a cold" about 3-4 wks ago & that's when her symptoms started- rattling cough, grey sput, no hemoptysis, incr SOB w/ activity esp rushing/ stairs/ etc but she says ADLs are OK & most stenuous activ is mailbox & back (not exercising);  She is a current everyday smoker> started in early teens, smoked for 70 yrs up to 1ppd max, and cut back to 3 cig/d for the last several months (~65+pack-yr smoking hx);  She states that sh'e in the process of quitting- "I just take a few puffs, I have the discipline";  She denies hx prior lung problems> rare episode of bronchitis requiring antibiotics, no prev pneumonia, no hx TB or known exposure, no prev Hosp for resp illness;  No known hx of asbestos exposure or other toxins;  She has hx sinusitis & prev sinus surg at [facility] in the 80s;  She has signif medical issues including> Underwt w/ BMI=15, HBP, CAD, CHF, HL, DM, etc;  FamHx is neg for resp problems (see below)...    Current Meds>  AlbutHFA prn, Flonase/ Claritin, ASA81, Lanoxin0.125, Coreg3.125Bid, Amlod5, Lotensin40, Lasix20, Lip10, Metform500, Glimep2, Vits...   EXAM shows Afeb, VSS, O2sat=95% on RA at rest;  83#, 5'3"Tall, BMI<15;  HEENT- neg, mallamapti1;  Chest- congested cough, scat rhonchi, no w/r/consolidation;  Heart- RR w/o m/r/g;  Abd- thin, soft, neg;  Ext- w/o c/c/e; Neuro- nonfocal...  2DEcho 05/01/2010 showed norm LVF w/ EF=60-65% & no regional wall motion abn, Gr1DD, mild MR, +atrial septal aneurysm, incr PAsys=31mmHg  CT Angio Chest  09/04/14 in EPIC showed NEG for PE...   CXR in [facility] 05/26/15 showed norm heart size, COPD/ hyperinflated/ flattened diaph/ NAD, degen changes in Tspine...  Spirometry 05/27/15 showed FVC=1.38 (66%), FEV1=0.66 (44%), %1sec=48, mid-flows reduced at 23% predicted;  This is c/w severe airflow obstruction & GOLD Stage3 COPD...  Ambulatory Oximetry 05/27/15> O2sat=100% on RA at rest w/ pulse=78/min; she ambulated 2 Laps and stopped due to fatigue, lowest O2sat=98% w/ pulse=114/min... IMP>>    Dyspnea is multifactorial-- due to COPD/emphysema, smoking, heart dis, deconditioning/ sedentary lifestyle/ 83 y/o...    COPD/ emphysema    Cigarette smoker, still smoking 3cig/d, 65+pack-yr smoking hx    PulmHTN w/ 2DEcho in 2012 showing PAsys est~64mmHg    CARDIAC Hx>  followed by Walker Kehr for HBP, diastolicCHF, hx AFib    MEDICAL Hx>  follwed by PCP for HBP, HL, DM, FTT/ underwt, etc... PLAN>>    We reviewed her objective data and the Dx of COPD/emphysema (GOLD Stage3); she understands that she must quit all smoking, and rec to start regular inhaler use-- SYMBICORT160-2spBid, INCRUSE-once daily, +OTC MUCINEX600mg  1-2Bid, +nutritional supplements daily;  She needs regular f/u w/ her PCP to work on med compliance, nutritional support, etc... ROV recheck in 6wks.  ~  July 21, 2015:  80mo ROV w/ SN>  [name] reports that her SOB is improved on the Symbicort160-2spBid, Incruse once daily, ProventilHFA as needed ("  my breathing is much better" she says); she is not using the Mucinex and unfortunately still smoking a few cigs per day; she still has a mild cough w/ some thick beige sput production; she denies CP/ tightness/ f/c/s... We reviewed her prev eval/ CXR/ spirometry/ etc...  EXAM shows Afeb, VSS, O2sat=94% on RA at rest;  84#, 5'3"Tall, BMI<15;  HEENT- neg, mallamapti2;  Chest- congested cough, decrBS, scat rhonchi, no w/r/consolidation;  Heart- RR w/o m/r/g;  Abd- thin, soft, neg;  Ext- w/o c/c/e...  IMP/PLAN>>  She  is again advised to quit all smoking! We reviewed med regimen w/ Symbicort160, Incruse, Mucinex, AlbutHFA rescue inhaler, etc;  Reminded to incr exercise program, walk regularly, eat better and gain some weight...   ~  August 15, 2016:  59mo ROV w/ SN>  [name] saw DrSood 07/11/16 w/ sinus congestion & drainage, +SOB, cough, wheezing- still smoking a few; she had the Symbicort but I suspect NOT using regularly, she was using her daughter's Albuterol rescue inhaler;  She desaturated w/ exercise (87% after 1 lap on RA & was started on Home O2, Ceftin, & Pred taper; asked to restart INCRUSE & given a rescue inhaler of her own... She improved on the regimen but continued smoking &  gradually deteriorated after she finished the Pred, stopped the Incruse, & backed off on the Symbicort...  She presented to the ER 07/26/16 w/ worsening SOB found to have a COPD exac, acute on chr hypoxemic resp failure, acute on chr diastolic CHF, along w/ her medical issues of wt loss, DM, malnutrition, and weakness => ADM by [facility] 4/3 - 08/02/16 and Disch for short term rehab; she was treated w/ antibiotics, IV Solumedrol, BiPAP, NEBs, etc;  Disch on Pred taper (off now), NEBs vs Proair prn, SYMBICORT160-2spBid, not on LAMA rx;  Cardiac meds as below;      Dyspnea is multifactorial-- due to COPD/emphysema, smoking, heart dis, deconditioning/ sedentary lifestyle/ 83 y/o => multidisciplinary approach supervised by PCP...    COPD/ emphysema-- on Symbicort160-2spBid, Incruse- once daily, O2 24/7 now & we will check ONO...    Cigarette smoker, still smoking 3cig/d prior to 07/2016 [facility], 65+pack-yr smoking hx    PulmHTN w/ 2DEcho in 2012 showing PAsys est~22mmHg    CARDIAC Hx>  followed by Walker Kehr for HBP, diastolicCHF, hx AFib, etc; on ASA81, Coreg6.25Bid, Amlod5, Lotensin10, Lasix20-2/d, Lanoxin0.125-1/2 daily    Medical Issues> HBP, HL (Lip10), DM (Metform, Glimep), FTT/ physical deconditioning/ poor nutrition; prev followed by  Brantley Fling, now seeing DrPandy, [facility] & her note of 08/04/16 is reviewed... EXAM shows Afeb, VSS, O2sat=95% on 2L O2;  76#, 5'3"Tall, BMI<15;  HEENT- neg, mallamapti2, tongue coated;  Chest- congested cough, decrBS, scat rhonchi, no w/r/consolidation;  Heart- RR w/o m/r/g;  Abd- thin, soft, neg;  Ext- w/o c/c/e...   CXR 07/26/16 (independently reviewed by me in the PACS system) showed cardiomeg, mild basilar scarring & lingular atx, scoliosis & DDD in Tspine...  CT Angio Chest 07/26/16>  Norm heart size, thor Ao atherosclerosis, NEG for PE; no adenopathy, +centrilob emphysema but otherw clear lungs, small effusions  LABS 07/2016>  Chems- Na~132, HCO3~36, BS~269, A1c=6.9, Cr~0.77;  CBC- Hg~11-12, WBC~15=>12K IMP/PLAN>>  Bhumi MUST take all meds as directed (needs admin from Family member care giver) and must commit to NO SMOKING;  We will check ONO in the interim;  Tongue coated- needs attn to oral hygiene, rx Nystatin;  Needs PT/ PT/ visiting nurses, etc... We plan ROV recheck 71mo.Marland KitchenMarland Kitchen  Past Medical History:  Diagnosis Date  . Acute bronchitis   . Acute on chronic respiratory failure with hypoxia (HCC)   . Benign hypertensive renal disease   . CAD (coronary artery disease)   . Cardiomyopathy    non-ischemic 04/2008 EF 45% cardiac MRI no scar  . CHF (congestive heart failure) (HCC)   . Chronic sinusitis   . COPD (chronic obstructive pulmonary disease) (HCC)   . COPD exacerbation (HCC)   . Depression   . Diabetes mellitus without complication, without long-term current use of insulin (HCC)   . Dyslipidemia associated with type 2 diabetes mellitus (HCC)   . Dyspnea on exertion    chronic  . Hypercholesterolemia   . Hypertension   . On home oxygen therapy    "3L; 24/7; for the last 2 1/2 weeks; none before that" (07/26/2016)  . Protein-calorie malnutrition, severe (HCC)   . Sinus headache    "chronic"  . Stroke [facility]) 1997   "mild"  . Type II diabetes mellitus  (HCC)     Past Surgical History:  Procedure Laterality Date  . carotid angiogram     2006  . ESOPHAGOGASTRODUODENOSCOPY     with foreign body removal  . NASAL SINUS SURGERY  ~1974;~ 1976  . TONSILLECTOMY     "when I was a chld"    Outpatient Encounter Prescriptions as of 08/15/2016  Medication Sig  . carvedilol (COREG) 6.25 MG tablet Take 1 tablet (6.25 mg total) by mouth 2 (two) times daily with a meal.  . acetaminophen (TYLENOL) 500 MG tablet Take 1,000 mg by mouth every 6 (six) hours as needed for pain.  Marland Kitchen albuterol (VENTOLIN HFA) 108 (90 Base) MCG/ACT inhaler Inhale 2 puffs into the lungs every 6 (six) hours as needed for wheezing or shortness of breath.  Marland Kitchen amLODipine (NORVASC) 5 MG tablet Take 1 tablet (5 mg total) by mouth daily.  Marland Kitchen aspirin EC 81 MG tablet Take 81 mg by mouth daily.  Marland Kitchen atorvastatin (LIPITOR) 10 MG tablet Take 1 tablet (10 mg total) by mouth daily at 6 PM.  . benazepril (LOTENSIN) 10 MG tablet Take 1 tablet (10 mg total) by mouth daily.  . budesonide-formoterol (SYMBICORT) 160-4.5 MCG/ACT inhaler Inhale 2 puffs into the lungs 2 (two) times daily.  . budesonide-formoterol (SYMBICORT) 160-4.5 MCG/ACT inhaler Inhale 2 puffs into the lungs 2 (two) times daily.  . digoxin (DIGOX) 0.125 MG tablet Take 0.5 tablets (0.0625 mg total) by mouth daily.  . fluticasone (FLONASE) 50 MCG/ACT nasal spray Place 1 spray into both nostrils daily as needed for allergies.  . furosemide (LASIX) 20 MG tablet Take 2 tablets (40 mg total) by mouth daily.  Marland Kitchen glimepiride (AMARYL) 2 MG tablet Take 2 mg by mouth daily before breakfast.    . ipratropium-albuterol (DUONEB) 0.5-2.5 (3) MG/3ML SOLN Take 3 mLs by nebulization every 2 (two) hours as needed.  . metFORMIN (GLUCOPHAGE-XR) 500 MG 24 hr tablet Take 1,000 mg by mouth 2 (two) times daily.   . predniSONE (DELTASONE) 10 MG tablet Take 10 mg by mouth daily. Start on 08/07/16. Stop date 08/08/16  . predniSONE (DELTASONE) 20 MG tablet Take 20 mg  by mouth daily. Stop date 08/06/16  . umeclidinium bromide (INCRUSE ELLIPTA) 62.5 MCG/INH AEPB Inhale 1 puff into the lungs daily.  Marland Kitchen UNABLE TO FIND Med Name: Med pass 120 cc by mouth 2 times daily   No facility-administered encounter medications on file as of 08/15/2016.     No Known Allergies  Immunization History  Administered Date(s) Administered  . Influenza,inj,Quad PF,36+ Mos 01/24/2015, 01/24/2016  . Pneumococcal-Unspecified 12/25/2014  NEED TO CHECK w/ DrSanders/ Koirala/ Pandy about her IMMUNIZ HISTORY...   Current Medications, Allergies, Past Medical History, Past Surgical History, Family History, and Social History were reviewed in [employer] record.   Review of Systems             All symptoms NEG except where BOLDED >>  Constitutional:  F/C/S, fatigue, anorexia, unexpected weight change. HEENT:  HA, visual changes, hearing loss, earache, nasal symptoms, sore throat, mouth sores, hoarseness. Resp:  cough, sputum, hemoptysis; SOB, tightness, wheezing. Cardio:  CP, palpit, DOE, orthopnea, edema. GI:  N/V/D/C, blood in stool; reflux, abd pain, distention, gas. GU:  dysuria, freq, urgency, hematuria, flank pain, voiding difficulty. MS:  joint pain, swelling, tenderness, decr ROM; neck pain, back pain, etc. Neuro:  HA, tremors, seizures, dizziness, syncope, weakness, numbness, gait abn. Skin:  suspicious lesions or skin rash. Heme:  adenopathy, bruising, bleeding. Psyche:  confusion, agitation, sleep disturbance, hallucinations, anxiety, depression suicidal.   Objective:   Physical Exam       Vital Signs:  Reviewed...   General:  WD, underweight, 83 y/o BF in NAD; alert & oriented; pleasant & cooperative... HEENT:  [place]/AT; Conjunctiva- pink, Sclera- nonicteric, EOM-wnl, PERRLA, Fundi-benign; EACs-clear, TMs-wnl; NOSE-sl red; THROAT-clear & wnl.  Neck:  Supple w/ decr ROM; no JVD; normal carotid impulses w/o bruits; no thyromegaly or nodules  palpated; no lymphadenopathy.  Chest:  DecrBS, clear without wheezes, rales, or rhonchi heard. Heart:  Regular Rhythm; norm S1 & S2 without murmurs, rubs, or gallops detected. Abdomen:  Soft & nontender- no guarding or rebound; normal bowel sounds; no organomegaly or masses palpated. Ext:  decrROM; without deformities +arthritic changes; no varicose veins, +venous insuffic, no edema;  Pulses intact w/o bruits. Neuro:  No focal neuro deficits Derm:  No lesions noted; no rash etc. Lymph:  No cervical, supraclavicular, axillary, or inguinal adenopathy palpated.   Assessment:      IMP>>    Dyspnea is multifactorial-- due to COPD/emphysema, smoking, heart dis, deconditioning/ sedentary lifestyle/ 83 y/o...    COPD/ emphysema    Cigarette smoker, still smoking 3cig/d, 65+pack-yr smoking hx    PulmHTN w/ 2DEcho in 2012 showing PAsys est~58mmHg    CARDIAC Hx>  followed by Walker Kehr for HBP, diastolicCHF, hx AFib    MEDICAL Hx>  follwed by PCP for HBP, HL, DM, FTT/ underwt, etc...  PLAN>> 05/27/15>   We reviewed her objective data and the Dx of COPD/emphysema (GOLD Stage3); she understands that she must quit all smoking, and rec to start regular inhaler use-- SYMBICORT160-2spBid, INCRUSE-once daily, +OTC MUCINEX600mg  1-2Bid, +nutritional supplements daily;  She needs regular f/u w/ her PCP to work on med compliance, nutritional support, etc... ROV recheck in 6wks. 07/21/15>   She is again advised to quit all smoking! We reviewed med regimen w/ Symbicort160, Incruse, Mucinex, AlbutHFA rescue inhaler, etc;  Reminded to incr exercise program, walk regularly, eat better and gain some weight... 08/15/16>   Videl MUST take all meds as directed (needs admin from Family member care giver) and must commit to NO SMOKING;  We will check ONO in the interim;  Tongue coated- needs attn to oral hygiene, rx Nystatin;  Needs PT/ PT/ visiting nurses, etc... We plan ROV recheck 35mo...     Plan:     Patient's  Medications  New Prescriptions   BUDESONIDE-FORMOTEROL (SYMBICORT) 160-4.5 MCG/ACT INHALER  Inhale 2 puffs into the lungs 2 (two) times daily.   UMECLIDINIUM BROMIDE (INCRUSE ELLIPTA) 62.5 MCG/INH AEPB    Inhale 1 puff into the lungs daily.  Previous Medications   ACETAMINOPHEN (TYLENOL) 500 MG TABLET    Take 1,000 mg by mouth every 6 (six) hours as needed for pain.   ALBUTEROL (VENTOLIN HFA) 108 (90 BASE) MCG/ACT INHALER    Inhale 2 puffs into the lungs every 6 (six) hours as needed for wheezing or shortness of breath.   AMLODIPINE (NORVASC) 5 MG TABLET    Take 1 tablet (5 mg total) by mouth daily.   ASPIRIN EC 81 MG TABLET    Take 81 mg by mouth daily.   ATORVASTATIN (LIPITOR) 10 MG TABLET    Take 1 tablet (10 mg total) by mouth daily at 6 PM.   BENAZEPRIL (LOTENSIN) 10 MG TABLET    Take 1 tablet (10 mg total) by mouth daily.   BUDESONIDE-FORMOTEROL (SYMBICORT) 160-4.5 MCG/ACT INHALER    Inhale 2 puffs into the lungs 2 (two) times daily.   CARVEDILOL (COREG) 6.25 MG TABLET    Take 1 tablet (6.25 mg total) by mouth 2 (two) times daily with a meal.   DIGOXIN (DIGOX) 0.125 MG TABLET    Take 0.5 tablets (0.0625 mg total) by mouth daily.   FLUTICASONE (FLONASE) 50 MCG/ACT NASAL SPRAY    Place 1 spray into both nostrils daily as needed for allergies.   FUROSEMIDE (LASIX) 20 MG TABLET    Take 2 tablets (40 mg total) by mouth daily.   GLIMEPIRIDE (AMARYL) 2 MG TABLET    Take 2 mg by mouth daily before breakfast.     IPRATROPIUM-ALBUTEROL (DUONEB) 0.5-2.5 (3) MG/3ML SOLN    Take 3 mLs by nebulization every 2 (two) hours as needed.   METFORMIN (GLUCOPHAGE-XR) 500 MG 24 HR TABLET    Take 1,000 mg by mouth 2 (two) times daily.    PREDNISONE (DELTASONE) 10 MG TABLET    Take 10 mg by mouth daily. Start on 08/07/16. Stop date 08/08/16   PREDNISONE (DELTASONE) 20 MG TABLET    Take 20 mg by mouth daily. Stop date 08/06/16   UNABLE TO FIND    Med Name: Med pass 120 cc by mouth 2 times daily  Modified  Medications   No medications on file  Discontinued Medications   No medications on file

## 2016-08-15 NOTE — Patient Instructions (Signed)
Today we updated your med list in our EPIC system...    Continue your current medications the same...  Use the SYMBICORT- 2 puffs twice daily...  Restart the INCRUSE one inhalation daily...  Continue the OXYGEN 24/7 at 2L/min flow rate...    We will check an overnight oximetry test to see if we can start to wean the Oxygen now...  For your tongue -- try the NYSTATIN -- one tsp swish & swallow 4 times daily...  Keep up the good work w/ physical therapy, occupational therapy, etc...     With attention to nutrition -- hopefully you will gain strength & stamina & weight...  Call for any questions...  Let's plan a follow up visit in 36mo, sooner if needed for problems.Marland KitchenMarland Kitchen

## 2016-08-16 ENCOUNTER — Encounter: Payer: Self-pay | Admitting: *Deleted

## 2016-08-16 ENCOUNTER — Other Ambulatory Visit: Payer: Self-pay | Admitting: *Deleted

## 2016-08-17 ENCOUNTER — Encounter: Payer: Self-pay | Admitting: *Deleted

## 2016-08-17 NOTE — Patient Outreach (Signed)
[facility]) Care Management  [facility] Social Work  08/17/2016  AZRIELLA JUSKO 03-29-1930 [id number]  Subjective:  Patient is a 83 year old female currently in rehab at [facility].  Patient states that she feels that she is progressing well and is ready to return home with her daughter. Per patient, her daughter and the rest of her family are very supportive. "They are my watch dogs".  Patient states that he daughter manages her medications, transportation to her medical appointments and her finances.  Objective:   Encounter Medications:  Outpatient Encounter Prescriptions as of 08/16/2016  Medication Sig  . acetaminophen (TYLENOL) 500 MG tablet Take 1,000 mg by mouth every 6 (six) hours as needed for pain.  Marland Kitchen albuterol (VENTOLIN HFA) 108 (90 Base) MCG/ACT inhaler Inhale 2 puffs into the lungs every 6 (six) hours as needed for wheezing or shortness of breath.  Marland Kitchen amLODipine (NORVASC) 5 MG tablet Take 1 tablet (5 mg total) by mouth daily.  Marland Kitchen aspirin EC 81 MG tablet Take 81 mg by mouth daily.  Marland Kitchen atorvastatin (LIPITOR) 10 MG tablet Take 1 tablet (10 mg total) by mouth daily at 6 PM.  . benazepril (LOTENSIN) 10 MG tablet Take 1 tablet (10 mg total) by mouth daily.  . budesonide-formoterol (SYMBICORT) 160-4.5 MCG/ACT inhaler Inhale 2 puffs into the lungs 2 (two) times daily.  . budesonide-formoterol (SYMBICORT) 160-4.5 MCG/ACT inhaler Inhale 2 puffs into the lungs 2 (two) times daily.  . carvedilol (COREG) 6.25 MG tablet Take 1 tablet (6.25 mg total) by mouth 2 (two) times daily with a meal.  . digoxin (DIGOX) 0.125 MG tablet Take 0.5 tablets (0.0625 mg total) by mouth daily.  . fluticasone (FLONASE) 50 MCG/ACT nasal spray Place 1 spray into both nostrils daily as needed for allergies.  . furosemide (LASIX) 20 MG tablet Take 2 tablets (40 mg total) by mouth daily.  Marland Kitchen glimepiride (AMARYL) 2 MG tablet Take 2 mg by mouth daily before breakfast.    . ipratropium-albuterol (DUONEB)  0.5-2.5 (3) MG/3ML SOLN Take 3 mLs by nebulization every 2 (two) hours as needed.  . metFORMIN (GLUCOPHAGE-XR) 500 MG 24 hr tablet Take 1,000 mg by mouth 2 (two) times daily.   . predniSONE (DELTASONE) 10 MG tablet Take 10 mg by mouth daily. Start on 08/07/16. Stop date 08/08/16  . predniSONE (DELTASONE) 20 MG tablet Take 20 mg by mouth daily. Stop date 08/06/16  . umeclidinium bromide (INCRUSE ELLIPTA) 62.5 MCG/INH AEPB Inhale 1 puff into the lungs daily.  Marland Kitchen UNABLE TO FIND Med Name: Med pass 120 cc by mouth 2 times daily   No facility-administered encounter medications on file as of 08/16/2016.     Functional Status:  In your present state of health, do you have any difficulty performing the following activities: 08/16/2016 07/26/2016  Hearing? Y N  Vision? Y N  Difficulty concentrating or making decisions? Malvin Johns  Walking or climbing stairs? Y Y  Dressing or bathing? Y Y  Doing errands, shopping? - Y  Preparing Food and eating ? N -  Using the Toilet? Y -  In the past six months, have you accidently leaked urine? N -  Do you have problems with loss of bowel control? N -  Managing your Medications? Y -  Managing your Finances? Y -  Housekeeping or managing your Housekeeping? Y -  Some recent data might be hidden    Fall/Depression Screening:  PHQ 2/9 Scores 08/16/2016 09/11/2014  PHQ - 2 Score 0  1    Assessment: Patient eager to return home, was friendly at bedside, however would close her eyes often during the visit as if to fall asleep.  Assessment was interrupted by the social work Geophysicist/field seismologist who came in to do an additional assessment related to her cognitive and emotional status.  This social worker spoke to lead social worker Lequita Halt, who stated that patient will discharge home on Monday with [facility] services through [facility](PT and OT). Patient will discharge with a hospital bed and Rollator walker.  Plan: This social worker will inform RNCM of patient's plan to discharge on 08/22/16  with HH.            This Child psychotherapist will follow up with patient post discharge from [facility] to assess for any additional community resource needs.   Adriana Reams [facility] Care Management [phone]

## 2016-08-18 DIAGNOSIS — J9601 Acute respiratory failure with hypoxia: Secondary | ICD-10-CM | POA: Diagnosis not present

## 2016-08-18 DIAGNOSIS — J449 Chronic obstructive pulmonary disease, unspecified: Secondary | ICD-10-CM | POA: Diagnosis not present

## 2016-08-19 ENCOUNTER — Encounter: Payer: Self-pay | Admitting: Pulmonary Disease

## 2016-08-19 ENCOUNTER — Non-Acute Institutional Stay (SKILLED_NURSING_FACILITY): Payer: Medicare Other | Admitting: Adult Health

## 2016-08-19 ENCOUNTER — Encounter: Payer: Self-pay | Admitting: Adult Health

## 2016-08-19 DIAGNOSIS — R531 Weakness: Secondary | ICD-10-CM | POA: Diagnosis not present

## 2016-08-19 DIAGNOSIS — E785 Hyperlipidemia, unspecified: Secondary | ICD-10-CM

## 2016-08-19 DIAGNOSIS — J309 Allergic rhinitis, unspecified: Secondary | ICD-10-CM | POA: Diagnosis not present

## 2016-08-19 DIAGNOSIS — B370 Candidal stomatitis: Secondary | ICD-10-CM

## 2016-08-19 DIAGNOSIS — J9611 Chronic respiratory failure with hypoxia: Secondary | ICD-10-CM | POA: Diagnosis not present

## 2016-08-19 DIAGNOSIS — I5032 Chronic diastolic (congestive) heart failure: Secondary | ICD-10-CM | POA: Diagnosis not present

## 2016-08-19 DIAGNOSIS — E139 Other specified diabetes mellitus without complications: Secondary | ICD-10-CM | POA: Diagnosis not present

## 2016-08-19 DIAGNOSIS — I10 Essential (primary) hypertension: Secondary | ICD-10-CM | POA: Diagnosis not present

## 2016-08-19 NOTE — Progress Notes (Addendum)
DATE:  08/19/2016   MRN:  [id number]  BIRTHDAY: [birth year]  Facility:  Nursing Home Location:  [facility]  Nursing Home Room Number: 307-P  LEVEL OF CARE:  SNF [phone])  Contact Information    Name Relation Home Work Babb Daughter [phone]  [phone]   Marchena,Chris Son   [phone]       Code Status History    Date Active Date Inactive Code Status Order ID Comments User Context   07/26/2016  6:30 PM 08/02/2016  7:17 PM Full Code [id number]  Alison Murray, MD Inpatient   11/08/2011  5:58 PM 11/10/2011  1:48 PM Full Code [id number]  Jack Quarto, RN Inpatient       Chief Complaint  Patient presents with  . Discharge Note    HISTORY OF PRESENT ILLNESS:  This is an 72-YO female who is for discharge home with Home health PT and OT.  She was admitted to [facility] on 08/02/2016 following an admission at [facility] 07/26/2016-08/02/2016 due to worsening dyspnea and cough.  She was recently given O2 @ 3L/min via [place] @ home, Prednisone and antibiotic. She was treated for COPD with acute exacerbation with IV Solumedrol, antibiotic and nebulization. She was also treated for acute on chronic diastolic heart failure. Lasix was increased to 40 mg daily and will follow-up with cardiology as outpatient.  She has PMH of dyslipidemia, hypertension, DM and COPD.  Patient was admitted to this facility for short-term rehabilitation after the patient's recent hospitalization.  Patient has completed SNF rehabilitation and therapy has cleared the patient for discharge.   PAST MEDICAL HISTORY:  Past Medical History:  Diagnosis Date  . Acute bronchitis   . Acute on chronic respiratory failure with hypoxia (HCC)   . Benign hypertensive renal disease   . CAD (coronary artery disease)   . Cardiomyopathy    non-ischemic 04/2008 EF 45% cardiac MRI no scar  . CHF (congestive heart failure) (HCC)   . Chronic sinusitis   . COPD (chronic obstructive  pulmonary disease) (HCC)   . COPD exacerbation (HCC)   . Depression   . Diabetes mellitus without complication, without long-term current use of insulin (HCC)   . Dyslipidemia associated with type 2 diabetes mellitus (HCC)   . Dyspnea on exertion    chronic  . Hypercholesterolemia   . Hypertension   . On home oxygen therapy    "3L; 24/7; for the last 2 1/2 weeks; none before that" (07/26/2016)  . Protein-calorie malnutrition, severe (HCC)   . Sinus headache    "chronic"  . Stroke [facility]) 1997   "mild"  . Type II diabetes mellitus (HCC)      CURRENT MEDICATIONS: Reviewed  Patient's Medications  New Prescriptions   No medications on file  Previous Medications   ACETAMINOPHEN (TYLENOL) 500 MG TABLET    Take 1,000 mg by mouth every 6 (six) hours as needed for pain.   ALBUTEROL (VENTOLIN HFA) 108 (90 BASE) MCG/ACT INHALER    Inhale 2 puffs into the lungs every 6 (six) hours as needed for wheezing or shortness of breath.   AMLODIPINE (NORVASC) 5 MG TABLET    Take 1 tablet (5 mg total) by mouth daily.   ASPIRIN EC 81 MG TABLET    Take 81 mg by mouth daily.   ATORVASTATIN (LIPITOR) 10 MG TABLET    Take 1 tablet (10 mg total) by mouth daily at 6 PM.   BENAZEPRIL (LOTENSIN)  10 MG TABLET    Take 1 tablet (10 mg total) by mouth daily.   BUDESONIDE-FORMOTEROL (SYMBICORT) 160-4.5 MCG/ACT INHALER    Inhale 2 puffs into the lungs 2 (two) times daily.   CARVEDILOL (COREG) 6.25 MG TABLET    Take 1 tablet (6.25 mg total) by mouth 2 (two) times daily with a meal.   DIGOXIN (DIGOX) 0.125 MG TABLET    Take 0.5 tablets (0.0625 mg total) by mouth daily.   FLUTICASONE (FLONASE) 50 MCG/ACT NASAL SPRAY    Place 1 spray into both nostrils daily as needed for allergies.   FUROSEMIDE (LASIX) 40 MG TABLET    Take 40 mg by mouth daily.   GLIMEPIRIDE (AMARYL) 2 MG TABLET    Take 2 mg by mouth daily before breakfast.    INSULIN ASPART (NOVOLOG) 100 UNIT/ML INJECTION    Inject into the skin daily as needed for high  blood sugar (For CBG >300).   IPRATROPIUM-ALBUTEROL (DUONEB) 0.5-2.5 (3) MG/3ML SOLN    Take 3 mLs by nebulization every 2 (two) hours as needed.   METFORMIN (GLUCOPHAGE-XR) 500 MG 24 HR TABLET    Take 1,000 mg by mouth 2 (two) times daily.    NYSTATIN (MYCOSTATIN) 100000 UNIT/ML SUSPENSION    Take 5 mLs by mouth 4 (four) times daily. Swish and swallow   UMECLIDINIUM BROMIDE (INCRUSE ELLIPTA) 62.5 MCG/INH AEPB    Inhale 1 puff into the lungs daily.   UNABLE TO FIND    Med Name: Med pass 120 mL by mouth 3 times daily  Modified Medications   No medications on file  Discontinued Medications   BUDESONIDE-FORMOTEROL (SYMBICORT) 160-4.5 MCG/ACT INHALER    Inhale 2 puffs into the lungs 2 (two) times daily.   FUROSEMIDE (LASIX) 20 MG TABLET    Take 2 tablets (40 mg total) by mouth daily.   PREDNISONE (DELTASONE) 10 MG TABLET    Take 10 mg by mouth daily. Start on 08/07/16. Stop date 08/08/16   PREDNISONE (DELTASONE) 20 MG TABLET    Take 20 mg by mouth daily. Stop date 08/06/16     No Known Allergies   REVIEW OF SYSTEMS:  GENERAL: no change in appetite, no fatigue, no weight changes, no fever, chills or weakness EYES: Denies change in vision, dry eyes, eye pain, itching or discharge EARS: Denies change in hearing, ringing in ears, or earache NOSE: Denies nasal congestion or epistaxis MOUTH and THROAT: Denies oral discomfort, gingival pain or bleeding, pain from teeth or hoarseness   RESPIRATORY: no cough, SOB, DOE, wheezing, hemoptysis CARDIAC: no chest pain, edema or palpitations GI: no abdominal pain, diarrhea, constipation, heart burn, nausea or vomiting GU: Denies dysuria, frequency, hematuria, incontinence, or discharge PSYCHIATRIC: Denies feeling of depression or anxiety. No report of hallucinations, insomnia, paranoia, or agitation     PHYSICAL EXAMINATION  GENERAL APPEARANCE: Well nourished. In no acute distress. Normal body habitus SKIN:  Skin is warm and dry.  HEAD: Normal in  size and contour. No evidence of trauma EYES: Lids open and close normally. No blepharitis, entropion or ectropion. PERRL. Conjunctivae are clear and sclerae are white. Lenses are without opacity EARS: Pinnae are normal. Patient hears normal voice tunes of the examiner MOUTH and THROAT: Lips are without lesions. Oral mucosa is moist and without lesions. Tongue + thrush NECK: supple, trachea midline, no neck masses, no thyroid tenderness, no thyromegaly LYMPHATICS: no LAN in the neck, no supraclavicular LAN RESPIRATORY: breathing is even & unlabored, BS CTAB CARDIAC: Irregularly irregular  he is movement is normal: Move all is or as follows all, no murmur,no extra heart sounds, no edema GI: abdomen soft, normal BS, no masses, no tenderness, no hepatomegaly, no splenomegaly EXTREMITIES:  Able to move X 4 is extremities PSYCHIATRIC: Alert to self and place, disoriented to time. Affect and behavior are appropriate   LABS/RADIOLOGY: Labs reviewed: Basic Metabolic Panel:  Recent Labs  16/10/96 0530 07/29/16 0343 07/29/16 0847 07/30/16 0408 08/04/16  NA 128* 131*  --  132* 135*  K 4.6 4.2  --  4.3 4.8  CL 86* 88*  --  87*  --   CO2 31 33*  --  36*  --   GLUCOSE 188* 69 36* 269*  --   BUN 27* 38*  --  32* 21  CREATININE 1.16* 0.81  --  0.77 0.6  CALCIUM 9.0 9.3  --  9.3  --    Liver Function Tests:  Recent Labs  05/24/16 0924  AST 19  ALT 13  ALKPHOS 49  BILITOT 0.5  PROT 7.1  ALBUMIN 4.5   CBC:  Recent Labs  07/26/16 1211 07/27/16 0530 07/29/16 0343 07/30/16 0408 08/04/16  WBC 15.1* 13.4* 13.0* 12.0* 13.0  NEUTROABS 11.9*  --   --   --  9  HGB 11.9* 10.9* 10.8* 10.6* 12.7  HCT 37.0 33.6* 33.0* 32.9* 39  MCV 94.9 93.6 90.9 92.2  --   PLT 228 219 255 263 240   Lipid Panel:  Recent Labs  05/24/16 0924  HDL 65   CBG:  Recent Labs  08/01/16 2131 08/02/16 0734 08/02/16 1114  GLUCAP 303* 105* 270*      Dg Chest 2 View  Result Date: 07/26/2016 CLINICAL  DATA:  Shortness of breath. EXAM: CHEST  2 VIEW COMPARISON:  04/23/2016.  05/26/2015. FINDINGS: Mediastinum and hilar structures are normal. Cardiomegaly with normal pulmonary vascularity. Mild lingular subsegmental atelectasis, best identified on lateral view. This is new from prior exams. Stable mild basal pleural thickening noted consistent scarring. No pneumothorax. Heart size stable. Degenerative changes and scoliosis thoracic spine . IMPRESSION: Mild lingular atelectasis, best identified on lateral view. No other focal abnormality identified. Exam otherwise stable from prior exams. Electronically Signed   By: Maisie Fus  Register   On: 07/26/2016 12:39   Ct Angio Chest Pe W Or Wo Contrast  Result Date: 07/27/2016 CLINICAL DATA:  Hypoxia, shortness of breath EXAM: CT ANGIOGRAPHY CHEST WITH CONTRAST TECHNIQUE: Multidetector CT imaging of the chest was performed using the standard protocol during bolus administration of intravenous contrast. Multiplanar CT image reconstructions and MIPs were obtained to evaluate the vascular anatomy. CONTRAST:  Seven 5 mL Isovue 370 COMPARISON:  None. FINDINGS: Cardiovascular: Satisfactory opacification of the pulmonary arteries to the segmental level. No evidence of pulmonary embolism. Normal heart size. No pericardial effusion. Thoracic aortic atherosclerosis. No thoracic aortic aneurysm. Mediastinum/Nodes: No enlarged mediastinal, hilar, or axillary lymph nodes. Thyroid gland, trachea, and esophagus demonstrate no significant findings. Lungs/Pleura: Lungs are clear. Bilateral centrilobular emphysema. No pneumothorax. Small left pleural effusion. Trace right pleural effusion. Upper Abdomen: No acute abdominal abnormality. Musculoskeletal: No acute osseous abnormality. No lytic or sclerotic osseous lesion. Review of the MIP images confirms the above findings. IMPRESSION: 1. No evidence of pulmonary embolus. 2.  Emphysema. (ICD10-J43.9) 3. Small left and trace right pleural  effusion. Electronically Signed   By: Elige Ko   On: 07/27/2016 16:11    ASSESSMENT/PLAN:  Generalized weakness - for Home health PT and OT, for therapeutic strengthening  exercises; fall precautions  Chronic respiratory failure with hypoxia - continue O2 at 3 L/minute via [place], Symbicort 160-4.5 g inhaler inhale 2 puffs into lungs twice a day, Incruse Ellipta 62.5 mcg INH  1 puff Q D,Ventolin HFA 90 g inhaler inhale 2 puffs into the lungs every 6 hours when necessary, ipratropium albuterol 0.5-2.5 (3) mg/ML take 3 mL by nebulization every 2 hours when necessary   Chronic diastolic heart failure - EF 45%; continue Lasix 40 mg by mouth daily, digoxin 0.125 mg 1/2 tab = 0.0625 mg 1 tab PO Q D; follow-up with cardiology  Essential hypertension - well-controlled; continue Norvasc 5 mg 1 tab by mouth daily, Coreg 6.25 mg 1 tab by mouth daily and Lotensin 20 mg 1 tab by mouth daily   Hyperlipidemia - continue Lipitor 10 mg 1 tab by mouth daily  Diabetes mellitus, type II - continue metformin 1000 mg 1 tab by mouth twice a day, Novolog 100 units/ml inject 5 units SQ Q D PRN  for CBG>300 and Glimepiride 2 mg 1 tab by mouth daily Lab Results  Component Value Date   HGBA1C 6.9 (H) 07/31/2016     Allergic rhinitis - continue Flonase 50 g/ACT Place 1 spray into both nostrils when necessary  Oral thrush - continue Nystatin 100,000 units/ml 5 ml swish and swallow QID X 14 more days    I have filled out patient's discharge paperwork and written prescriptions.  Patient will receive home health PT and OT.  DME provided:  Bedside commode  Total discharge time: Greater than 30 minutes Greater than 50% was spent in counseling and coordination of care with the patient.   Discharge time involved coordination of the discharge process with social worker, nursing staff and therapy department. Medical justification for home health services/DME verified.   Zayna Toste C. Medina-Vargas - NP    [employer] [phone]

## 2016-08-22 ENCOUNTER — Other Ambulatory Visit: Payer: Self-pay | Admitting: Adult Health

## 2016-08-23 ENCOUNTER — Other Ambulatory Visit: Payer: Self-pay | Admitting: *Deleted

## 2016-08-23 ENCOUNTER — Encounter: Payer: Self-pay | Admitting: *Deleted

## 2016-08-23 DIAGNOSIS — L89312 Pressure ulcer of right buttock, stage 2: Secondary | ICD-10-CM | POA: Diagnosis not present

## 2016-08-23 DIAGNOSIS — I5032 Chronic diastolic (congestive) heart failure: Secondary | ICD-10-CM | POA: Diagnosis not present

## 2016-08-23 DIAGNOSIS — M6281 Muscle weakness (generalized): Secondary | ICD-10-CM | POA: Diagnosis not present

## 2016-08-23 DIAGNOSIS — Z9981 Dependence on supplemental oxygen: Secondary | ICD-10-CM | POA: Diagnosis not present

## 2016-08-23 DIAGNOSIS — E119 Type 2 diabetes mellitus without complications: Secondary | ICD-10-CM | POA: Diagnosis not present

## 2016-08-23 DIAGNOSIS — J441 Chronic obstructive pulmonary disease with (acute) exacerbation: Secondary | ICD-10-CM | POA: Diagnosis not present

## 2016-08-23 DIAGNOSIS — I2510 Atherosclerotic heart disease of native coronary artery without angina pectoris: Secondary | ICD-10-CM | POA: Diagnosis not present

## 2016-08-23 DIAGNOSIS — I110 Hypertensive heart disease with heart failure: Secondary | ICD-10-CM | POA: Diagnosis not present

## 2016-08-23 DIAGNOSIS — Z8673 Personal history of transient ischemic attack (TIA), and cerebral infarction without residual deficits: Secondary | ICD-10-CM | POA: Diagnosis not present

## 2016-08-23 DIAGNOSIS — J9611 Chronic respiratory failure with hypoxia: Secondary | ICD-10-CM | POA: Diagnosis not present

## 2016-08-23 DIAGNOSIS — E43 Unspecified severe protein-calorie malnutrition: Secondary | ICD-10-CM | POA: Diagnosis not present

## 2016-08-23 DIAGNOSIS — L89150 Pressure ulcer of sacral region, unstageable: Secondary | ICD-10-CM | POA: Diagnosis not present

## 2016-08-23 DIAGNOSIS — L89310 Pressure ulcer of right buttock, unstageable: Secondary | ICD-10-CM | POA: Diagnosis not present

## 2016-08-23 NOTE — Patient Outreach (Signed)
Transition of care call completed as this RN CM covering for coworker Su Hilt RN CM-  follow up on recent discharge from [facility] 4/30, admitted after hospital stay 4/3-4/10 for acute on chronic respiratory failure, COPD exacerbation. Spoke with pt's daughter [name] (primary contact, on consent form), HIPAA verified on pt, discussed purpose of call (follow up on pt's recent discharge home).   Daughter reports pt is doing well since discharge, has a little sob when ambulating but not more than usual.   Daughter reports pt did not have an appetite but started to get better this weekend while at the SNF.  Daughter reports HH PT is suppose to come today,HH RN was ordered for 3 times a week for wound in back plus pt to see Wound Care MD tomorrow. Daughter had a question about pt's oxygen level- on 3 Liters at home, adjusted it to 2.5 liters to which RN CM suggested [facility] PT check pt's oxygen  Saturation today.   Daughter reports pt was suppose to see Heart MD the end of May/received a call to  move it up, also to  see Lung MD this month. Daughter reports pt does not have an appointment to follow up with  Primary Care MD to which RN CM encouraged her to call.   Daughter reports she assists pt with her medications, plans to  go to pharmacy today  to pick up new medications as pt  does not have the  Novolog insulin (prn if sugars >300) and Lasix.  Patient was recently discharged from hospital and all medications have been reviewed.  Discussed with daughter Transition of care program- follow pt  for 31 days (weekly phone calls, a home visit),this  RN CM covering for coworker Su Hilt RN CM who will be the one to follow up next week telephonically.     Plan: As discussed, daughter to call Primary Care MD,     Schedule pt's follow up appointment.   Update to be provided  to coworker Su Hilt RN CM (pt's primary nurse) upon her return.              Plan to inform Dr. Docia Chuck of [facility] involvement- barrier letter to be  sent.    Shayne Alken.   Jenel Gierke RN CCM [facility] Care Management  [phone]

## 2016-08-24 ENCOUNTER — Encounter (HOSPITAL_BASED_OUTPATIENT_CLINIC_OR_DEPARTMENT_OTHER): Payer: Medicare Other | Attending: Surgery

## 2016-08-24 DIAGNOSIS — L89210 Pressure ulcer of right hip, unstageable: Secondary | ICD-10-CM | POA: Diagnosis not present

## 2016-08-24 DIAGNOSIS — F329 Major depressive disorder, single episode, unspecified: Secondary | ICD-10-CM | POA: Diagnosis not present

## 2016-08-24 DIAGNOSIS — J449 Chronic obstructive pulmonary disease, unspecified: Secondary | ICD-10-CM | POA: Diagnosis not present

## 2016-08-24 DIAGNOSIS — E46 Unspecified protein-calorie malnutrition: Secondary | ICD-10-CM | POA: Insufficient documentation

## 2016-08-24 DIAGNOSIS — L89310 Pressure ulcer of right buttock, unstageable: Secondary | ICD-10-CM | POA: Diagnosis not present

## 2016-08-24 DIAGNOSIS — E11622 Type 2 diabetes mellitus with other skin ulcer: Secondary | ICD-10-CM | POA: Diagnosis not present

## 2016-08-24 DIAGNOSIS — I110 Hypertensive heart disease with heart failure: Secondary | ICD-10-CM | POA: Insufficient documentation

## 2016-08-24 DIAGNOSIS — Z794 Long term (current) use of insulin: Secondary | ICD-10-CM | POA: Insufficient documentation

## 2016-08-24 DIAGNOSIS — I509 Heart failure, unspecified: Secondary | ICD-10-CM | POA: Diagnosis not present

## 2016-08-24 DIAGNOSIS — I2510 Atherosclerotic heart disease of native coronary artery without angina pectoris: Secondary | ICD-10-CM | POA: Insufficient documentation

## 2016-08-24 DIAGNOSIS — J441 Chronic obstructive pulmonary disease with (acute) exacerbation: Secondary | ICD-10-CM | POA: Diagnosis not present

## 2016-08-24 DIAGNOSIS — L89150 Pressure ulcer of sacral region, unstageable: Secondary | ICD-10-CM | POA: Insufficient documentation

## 2016-08-24 DIAGNOSIS — E119 Type 2 diabetes mellitus without complications: Secondary | ICD-10-CM | POA: Diagnosis not present

## 2016-08-24 DIAGNOSIS — M6281 Muscle weakness (generalized): Secondary | ICD-10-CM | POA: Diagnosis not present

## 2016-08-24 DIAGNOSIS — L89312 Pressure ulcer of right buttock, stage 2: Secondary | ICD-10-CM | POA: Diagnosis not present

## 2016-08-25 DIAGNOSIS — L89150 Pressure ulcer of sacral region, unstageable: Secondary | ICD-10-CM | POA: Diagnosis not present

## 2016-08-25 DIAGNOSIS — L89310 Pressure ulcer of right buttock, unstageable: Secondary | ICD-10-CM | POA: Diagnosis not present

## 2016-08-25 DIAGNOSIS — E119 Type 2 diabetes mellitus without complications: Secondary | ICD-10-CM | POA: Diagnosis not present

## 2016-08-25 DIAGNOSIS — J441 Chronic obstructive pulmonary disease with (acute) exacerbation: Secondary | ICD-10-CM | POA: Diagnosis not present

## 2016-08-25 DIAGNOSIS — M6281 Muscle weakness (generalized): Secondary | ICD-10-CM | POA: Diagnosis not present

## 2016-08-25 DIAGNOSIS — L89312 Pressure ulcer of right buttock, stage 2: Secondary | ICD-10-CM | POA: Diagnosis not present

## 2016-08-26 ENCOUNTER — Other Ambulatory Visit: Payer: Self-pay | Admitting: *Deleted

## 2016-08-26 DIAGNOSIS — M6281 Muscle weakness (generalized): Secondary | ICD-10-CM | POA: Diagnosis not present

## 2016-08-26 DIAGNOSIS — J441 Chronic obstructive pulmonary disease with (acute) exacerbation: Secondary | ICD-10-CM | POA: Diagnosis not present

## 2016-08-26 DIAGNOSIS — E119 Type 2 diabetes mellitus without complications: Secondary | ICD-10-CM | POA: Diagnosis not present

## 2016-08-26 DIAGNOSIS — L89310 Pressure ulcer of right buttock, unstageable: Secondary | ICD-10-CM | POA: Diagnosis not present

## 2016-08-26 DIAGNOSIS — L89150 Pressure ulcer of sacral region, unstageable: Secondary | ICD-10-CM | POA: Diagnosis not present

## 2016-08-26 DIAGNOSIS — L89312 Pressure ulcer of right buttock, stage 2: Secondary | ICD-10-CM | POA: Diagnosis not present

## 2016-08-26 NOTE — Patient Outreach (Signed)
[facility]) Care Management  08/26/2016  [name] 05-Apr-1930 [id number]   Phone call to patient's daughter to follow up on patient's progress following her stay at [facility] and to assess for any community resource needs.  This Child psychotherapist spoke with patient's daughter [name] who stated that patient was adjusting well. Patient currently receiving HH services through [facility]. PT came today, OT yesterday. Per patient's daughter, patient has a bedside commode, however the hospital bed was not covered by insurance. They do have a family member who is allowing them to borrow their hospital bed which should be transported by her nephew from [place] as early as this weekend.   Patient's daughter verbalized having no additional community resource needs.   Plan: Patient to be closed to social work at this time.    Adriana Reams [facility] Care Management [phone]

## 2016-08-29 ENCOUNTER — Encounter: Payer: Self-pay | Admitting: *Deleted

## 2016-08-29 DIAGNOSIS — Z7984 Long term (current) use of oral hypoglycemic drugs: Secondary | ICD-10-CM | POA: Diagnosis not present

## 2016-08-29 DIAGNOSIS — I10 Essential (primary) hypertension: Secondary | ICD-10-CM | POA: Diagnosis not present

## 2016-08-29 DIAGNOSIS — I482 Chronic atrial fibrillation: Secondary | ICD-10-CM | POA: Diagnosis not present

## 2016-08-29 DIAGNOSIS — E119 Type 2 diabetes mellitus without complications: Secondary | ICD-10-CM | POA: Diagnosis not present

## 2016-08-29 DIAGNOSIS — Z79899 Other long term (current) drug therapy: Secondary | ICD-10-CM | POA: Diagnosis not present

## 2016-08-29 DIAGNOSIS — E43 Unspecified severe protein-calorie malnutrition: Secondary | ICD-10-CM | POA: Diagnosis not present

## 2016-08-29 DIAGNOSIS — E1165 Type 2 diabetes mellitus with hyperglycemia: Secondary | ICD-10-CM | POA: Diagnosis not present

## 2016-08-29 DIAGNOSIS — E7800 Pure hypercholesterolemia, unspecified: Secondary | ICD-10-CM | POA: Diagnosis not present

## 2016-08-29 DIAGNOSIS — J449 Chronic obstructive pulmonary disease, unspecified: Secondary | ICD-10-CM | POA: Diagnosis not present

## 2016-08-30 DIAGNOSIS — J441 Chronic obstructive pulmonary disease with (acute) exacerbation: Secondary | ICD-10-CM | POA: Diagnosis not present

## 2016-08-30 DIAGNOSIS — L89150 Pressure ulcer of sacral region, unstageable: Secondary | ICD-10-CM | POA: Diagnosis not present

## 2016-08-30 DIAGNOSIS — E119 Type 2 diabetes mellitus without complications: Secondary | ICD-10-CM | POA: Diagnosis not present

## 2016-08-30 DIAGNOSIS — L89310 Pressure ulcer of right buttock, unstageable: Secondary | ICD-10-CM | POA: Diagnosis not present

## 2016-08-30 DIAGNOSIS — M6281 Muscle weakness (generalized): Secondary | ICD-10-CM | POA: Diagnosis not present

## 2016-08-30 DIAGNOSIS — L89312 Pressure ulcer of right buttock, stage 2: Secondary | ICD-10-CM | POA: Diagnosis not present

## 2016-08-31 ENCOUNTER — Encounter: Payer: Self-pay | Admitting: Cardiovascular Disease

## 2016-08-31 DIAGNOSIS — J441 Chronic obstructive pulmonary disease with (acute) exacerbation: Secondary | ICD-10-CM | POA: Diagnosis not present

## 2016-08-31 DIAGNOSIS — E46 Unspecified protein-calorie malnutrition: Secondary | ICD-10-CM | POA: Diagnosis not present

## 2016-08-31 DIAGNOSIS — L89210 Pressure ulcer of right hip, unstageable: Secondary | ICD-10-CM | POA: Diagnosis not present

## 2016-08-31 DIAGNOSIS — M6281 Muscle weakness (generalized): Secondary | ICD-10-CM | POA: Diagnosis not present

## 2016-08-31 DIAGNOSIS — L89310 Pressure ulcer of right buttock, unstageable: Secondary | ICD-10-CM | POA: Diagnosis not present

## 2016-08-31 DIAGNOSIS — E11622 Type 2 diabetes mellitus with other skin ulcer: Secondary | ICD-10-CM | POA: Diagnosis not present

## 2016-08-31 DIAGNOSIS — L89312 Pressure ulcer of right buttock, stage 2: Secondary | ICD-10-CM | POA: Diagnosis not present

## 2016-08-31 DIAGNOSIS — L89213 Pressure ulcer of right hip, stage 3: Secondary | ICD-10-CM | POA: Diagnosis not present

## 2016-08-31 DIAGNOSIS — E119 Type 2 diabetes mellitus without complications: Secondary | ICD-10-CM | POA: Diagnosis not present

## 2016-08-31 DIAGNOSIS — J449 Chronic obstructive pulmonary disease, unspecified: Secondary | ICD-10-CM | POA: Diagnosis not present

## 2016-08-31 DIAGNOSIS — L89150 Pressure ulcer of sacral region, unstageable: Secondary | ICD-10-CM | POA: Diagnosis not present

## 2016-08-31 DIAGNOSIS — L89153 Pressure ulcer of sacral region, stage 3: Secondary | ICD-10-CM | POA: Diagnosis not present

## 2016-08-31 DIAGNOSIS — I110 Hypertensive heart disease with heart failure: Secondary | ICD-10-CM | POA: Diagnosis not present

## 2016-09-01 ENCOUNTER — Encounter: Payer: Self-pay | Admitting: *Deleted

## 2016-09-01 ENCOUNTER — Other Ambulatory Visit: Payer: Self-pay | Admitting: *Deleted

## 2016-09-01 DIAGNOSIS — E119 Type 2 diabetes mellitus without complications: Secondary | ICD-10-CM | POA: Diagnosis not present

## 2016-09-01 DIAGNOSIS — L89150 Pressure ulcer of sacral region, unstageable: Secondary | ICD-10-CM | POA: Diagnosis not present

## 2016-09-01 DIAGNOSIS — L89310 Pressure ulcer of right buttock, unstageable: Secondary | ICD-10-CM | POA: Diagnosis not present

## 2016-09-01 DIAGNOSIS — J441 Chronic obstructive pulmonary disease with (acute) exacerbation: Secondary | ICD-10-CM | POA: Diagnosis not present

## 2016-09-01 DIAGNOSIS — M6281 Muscle weakness (generalized): Secondary | ICD-10-CM | POA: Diagnosis not present

## 2016-09-01 DIAGNOSIS — L89312 Pressure ulcer of right buttock, stage 2: Secondary | ICD-10-CM | POA: Diagnosis not present

## 2016-09-01 NOTE — Patient Outreach (Signed)
[facility]) Care Management [facility] Community CM Telephone Outreach, Transition of Care day 10  09/01/2016  CHAUNICE IERARDI 02-18-1930 [id number]   Successful telephone outreach to [name], daughter/ caregiver (on [facility] written consent) of [name], 83 y/o female referred to [facility] Community CM for transition of care after recent hospitalization April 3- 10, 2018 for acute-on-chronic respiratory failure with hypoxia and COPD exacerbation.  Patient was discharged to SNF for rehabilitation and was subsequently discharged from [facility] on August 22, 2016.  Patient has history including, but not limited to COPD, chronic respiratory failure on home O2 at 2-3 L/min, HTN, HLD, DM on insulin, dCHF, and malnutrition.  HIPAA/ identity verified with patient's daughter [name] during phone call today, and verbal consent for [facility] CM services was obtained.  Today, patient's caregiver reports:  -- patient doing overall "well," in no pain/ discomfort  -- as all prescribed medications and takes as instructed/ prescribed.  Caregiver denies questions around medications and verbalizes good general understanding of purpose, dosing, and scheduling of medications.  -- has attended all provider appointments post- SNF discharge; currently attending wound clinic for "open area" on patient's sacral and lower buttock area; reports "wound healing without problems."  All upcoming provider appointments reviewed with patient's caregiver today.  Daughter/ caregiver transports patient to all appointments.  -- home health [facility]) services currently in place for nursing and wound care management; caregiver reports "[facility] nurse just left, sessions going well."  Caregiver states that on days that [facility] nurse does not come, caregiver changes wound dressing, states wound dressing changes "going fine" and denies questions/ concerns with wound management.  Caregiver reports that patient is able to reposition herself when in bed, and that  she makes sure patient is turned "every 30 minutes to every hour."  -- denies community resource needs; states supportive and actively involved family members who assist with care needs.  Patient lives with caregiver/ daughter [name].  -- No falls post- SNF discharge; had one incident "last night" when caregiver was assisting patient upstairs to bedroom; caregiver states patient became "tired" at top step, and "just sat down."  Denies hard fall/ injury; states caregiver was able to assist patient up without difficulty.  Pt. Currently using walker for ambulation "most of the time."  General fall risks/ prevention education provided.   -- self-health management of COPD:  Patient currently continuing wearing O2 at "all times 2-3 L/min;" caregiver denies breathing difficulty outside of patient's baseline; states patient at times becomes anxious around having care needs for toileting, and feels short of breath due to anxiety; caregiver states that patient is easily reassured, and that these episodes have required no additional intervention.  Caregiver reports that she currently does not have pulse oximeter at home, and options to obtain were discussed with caregiver.  Patient denies further issues, concerns, or problems today.  I provided patient's caregiver with my direct phone number, the main [facility] CM office phone number, and the [facility] CM 24-hour nurse advice phone number should issues arise prior to next scheduled [facility] Community CM outreach by telephone next week.  Scheduled initial home visit in 2 weeks.  Plan:  Patient will take medications as prescribed and will attend all provider appointments post- SNF discharge  Patient/ caregiver will promptly notify providers of new concerns, issues, or problems.  Patient will continue working with home health services as ordered post- SNF discharge  [facility] Community CM outreach for transition of care to continue with scheduled telephone call next week  and initial  home visit in 2 weeks.  Caryl Pina, RN, BSN, [employer] [facility] Care Management  [phone]

## 2016-09-02 DIAGNOSIS — L89150 Pressure ulcer of sacral region, unstageable: Secondary | ICD-10-CM | POA: Diagnosis not present

## 2016-09-02 DIAGNOSIS — L89312 Pressure ulcer of right buttock, stage 2: Secondary | ICD-10-CM | POA: Diagnosis not present

## 2016-09-02 DIAGNOSIS — E119 Type 2 diabetes mellitus without complications: Secondary | ICD-10-CM | POA: Diagnosis not present

## 2016-09-02 DIAGNOSIS — M6281 Muscle weakness (generalized): Secondary | ICD-10-CM | POA: Diagnosis not present

## 2016-09-02 DIAGNOSIS — L89310 Pressure ulcer of right buttock, unstageable: Secondary | ICD-10-CM | POA: Diagnosis not present

## 2016-09-02 DIAGNOSIS — J441 Chronic obstructive pulmonary disease with (acute) exacerbation: Secondary | ICD-10-CM | POA: Diagnosis not present

## 2016-09-06 DIAGNOSIS — L89312 Pressure ulcer of right buttock, stage 2: Secondary | ICD-10-CM | POA: Diagnosis not present

## 2016-09-06 DIAGNOSIS — L89150 Pressure ulcer of sacral region, unstageable: Secondary | ICD-10-CM | POA: Diagnosis not present

## 2016-09-06 DIAGNOSIS — L89310 Pressure ulcer of right buttock, unstageable: Secondary | ICD-10-CM | POA: Diagnosis not present

## 2016-09-06 DIAGNOSIS — M6281 Muscle weakness (generalized): Secondary | ICD-10-CM | POA: Diagnosis not present

## 2016-09-06 DIAGNOSIS — J441 Chronic obstructive pulmonary disease with (acute) exacerbation: Secondary | ICD-10-CM | POA: Diagnosis not present

## 2016-09-06 DIAGNOSIS — E119 Type 2 diabetes mellitus without complications: Secondary | ICD-10-CM | POA: Diagnosis not present

## 2016-09-07 ENCOUNTER — Encounter: Payer: Self-pay | Admitting: *Deleted

## 2016-09-07 ENCOUNTER — Other Ambulatory Visit: Payer: Self-pay | Admitting: *Deleted

## 2016-09-07 DIAGNOSIS — L89153 Pressure ulcer of sacral region, stage 3: Secondary | ICD-10-CM | POA: Diagnosis not present

## 2016-09-07 DIAGNOSIS — L89310 Pressure ulcer of right buttock, unstageable: Secondary | ICD-10-CM | POA: Diagnosis not present

## 2016-09-07 DIAGNOSIS — J449 Chronic obstructive pulmonary disease, unspecified: Secondary | ICD-10-CM | POA: Diagnosis not present

## 2016-09-07 DIAGNOSIS — E46 Unspecified protein-calorie malnutrition: Secondary | ICD-10-CM | POA: Diagnosis not present

## 2016-09-07 DIAGNOSIS — E11622 Type 2 diabetes mellitus with other skin ulcer: Secondary | ICD-10-CM | POA: Diagnosis not present

## 2016-09-07 DIAGNOSIS — J441 Chronic obstructive pulmonary disease with (acute) exacerbation: Secondary | ICD-10-CM | POA: Diagnosis not present

## 2016-09-07 DIAGNOSIS — L89150 Pressure ulcer of sacral region, unstageable: Secondary | ICD-10-CM | POA: Diagnosis not present

## 2016-09-07 DIAGNOSIS — L89312 Pressure ulcer of right buttock, stage 2: Secondary | ICD-10-CM | POA: Diagnosis not present

## 2016-09-07 DIAGNOSIS — I110 Hypertensive heart disease with heart failure: Secondary | ICD-10-CM | POA: Diagnosis not present

## 2016-09-07 DIAGNOSIS — L89213 Pressure ulcer of right hip, stage 3: Secondary | ICD-10-CM | POA: Diagnosis not present

## 2016-09-07 DIAGNOSIS — L89210 Pressure ulcer of right hip, unstageable: Secondary | ICD-10-CM | POA: Diagnosis not present

## 2016-09-07 DIAGNOSIS — E119 Type 2 diabetes mellitus without complications: Secondary | ICD-10-CM | POA: Diagnosis not present

## 2016-09-07 DIAGNOSIS — M6281 Muscle weakness (generalized): Secondary | ICD-10-CM | POA: Diagnosis not present

## 2016-09-07 NOTE — Patient Outreach (Signed)
[facility] Customer service manager [facility]) Care Management [facility] Community CM Telephone Outreach, Transition of Care day 16  09/07/2016     [name] 03-24-1930 [id number]  Successful telephone outreach to [name], daughter/ caregiver (on [facility] written consent) of [name], 83 y/o female referred to [facility] Community CM for transition of care after recent hospitalization April 3- 10, 2018 for acute-on-chronic respiratory failure with hypoxia and COPD exacerbation.  Patient was discharged to SNF for rehabilitation and was subsequently discharged from [facility] on August 22, 2016.  Patient has history including, but not limited to COPD, chronic respiratory failure on home O2 at 2-3 L/min, HTN, HLD, DM on insulin, dCHF, and malnutrition.  HIPAA/ identity verified with patient's daughter [name] (on [facility] CM written consent) during phone call today.  Today, patient's caregiver reports:  -- patient doing overall "about the same," in no pain/ discomfort; states no changes in overall patient condition.  -- as all prescribed medications and takes as instructed/ prescribed.  Caregiver denies questions around medications and verbalizes good general understanding of purpose, dosing, and scheduling of medications.  -- has attended all provider appointments post- SNF discharge; currently attending wound clinic for "open area" on patient's sacral and lower buttock area; reports went to wound clinic today and "got a good report," "wound healing nicely without problems."  All upcoming provider appointments reviewed with patient's caregiver today.  Daughter/ caregiver continues to transport patient to all provider appointments.  -- home health [facility]) services currently in place for PT for strengthening and for nursing for wound care management; caregiver reports "[facility] PT and nurse both visited yesterday."  Caregiver continues to report that she changes patient's wound dressing on days that [facility] nurse does not come.  Reports  HH PT/ OT visit patient "twice a week," while [facility] RN visits patient "once a week."  Denies questions/ concerns with wound progression/ wound management.  Caregiver reports that patient continues to reposition herself when in bed independently/ frequently.  Reports new hospital bed/ mattress was delivered to patient on Monday, Sep 05, 2016, and "seems to be working out well."  -- Industrial/product designer:  Caregiver reports "new fall last night."  States patient "woke up during night and forgot to use her walker."  Reports patient lost her balance and fell without injury; states caregiver was able to assist patient up without difficulty.  Patient otherwise using walker "all the time."  General fall risks/ prevention education reinforced today during phone call.   -- self-health management of COPD:  Patient currently continuing wearing O2 at "all times 2-3 L/min;" caregiver denies breathing difficulty outside of patient's baseline; states patient at times becomes anxious around no longer smoking, stating that patient has "been quit now for 42 days."  Caregiver again reports that she currently does not have pulse oximeter at home, and options to obtain were again discussed with caregiver, who is considering purchasing one.    Patient denies further issues, concerns, or problems today. I confirmed that patient's caregiver has my direct phone number, the main [facility] CM office phone number, and the [facility] CM 24-hour nurse advice phone number should issues arise prior to next scheduled [facility] Community CM outreach with initial home visit next week.    Plan:  Patient will take medications as prescribed and will attend all provider appointments post- SNF discharge.  Patient/ caregiver will promptly notify providers of new concerns, issues, or problems.  Patient will continue working with home health services as ordered post- SNF discharge  [facility] Community CM outreach for transition of care to continue with  scheduled initial home visit next week.  Caryl Pina, RN, BSN, [employer] [facility] Care Management  [phone]

## 2016-09-08 DIAGNOSIS — J441 Chronic obstructive pulmonary disease with (acute) exacerbation: Secondary | ICD-10-CM | POA: Diagnosis not present

## 2016-09-08 DIAGNOSIS — E119 Type 2 diabetes mellitus without complications: Secondary | ICD-10-CM | POA: Diagnosis not present

## 2016-09-08 DIAGNOSIS — M6281 Muscle weakness (generalized): Secondary | ICD-10-CM | POA: Diagnosis not present

## 2016-09-08 DIAGNOSIS — L89150 Pressure ulcer of sacral region, unstageable: Secondary | ICD-10-CM | POA: Diagnosis not present

## 2016-09-08 DIAGNOSIS — L89310 Pressure ulcer of right buttock, unstageable: Secondary | ICD-10-CM | POA: Diagnosis not present

## 2016-09-08 DIAGNOSIS — L89312 Pressure ulcer of right buttock, stage 2: Secondary | ICD-10-CM | POA: Diagnosis not present

## 2016-09-09 DIAGNOSIS — E119 Type 2 diabetes mellitus without complications: Secondary | ICD-10-CM | POA: Diagnosis not present

## 2016-09-09 DIAGNOSIS — L89312 Pressure ulcer of right buttock, stage 2: Secondary | ICD-10-CM | POA: Diagnosis not present

## 2016-09-09 DIAGNOSIS — M6281 Muscle weakness (generalized): Secondary | ICD-10-CM | POA: Diagnosis not present

## 2016-09-09 DIAGNOSIS — L89310 Pressure ulcer of right buttock, unstageable: Secondary | ICD-10-CM | POA: Diagnosis not present

## 2016-09-09 DIAGNOSIS — L89150 Pressure ulcer of sacral region, unstageable: Secondary | ICD-10-CM | POA: Diagnosis not present

## 2016-09-09 DIAGNOSIS — J441 Chronic obstructive pulmonary disease with (acute) exacerbation: Secondary | ICD-10-CM | POA: Diagnosis not present

## 2016-09-12 DIAGNOSIS — E119 Type 2 diabetes mellitus without complications: Secondary | ICD-10-CM | POA: Diagnosis not present

## 2016-09-12 DIAGNOSIS — J441 Chronic obstructive pulmonary disease with (acute) exacerbation: Secondary | ICD-10-CM | POA: Diagnosis not present

## 2016-09-12 DIAGNOSIS — M6281 Muscle weakness (generalized): Secondary | ICD-10-CM | POA: Diagnosis not present

## 2016-09-12 DIAGNOSIS — L89312 Pressure ulcer of right buttock, stage 2: Secondary | ICD-10-CM | POA: Diagnosis not present

## 2016-09-12 DIAGNOSIS — L89310 Pressure ulcer of right buttock, unstageable: Secondary | ICD-10-CM | POA: Diagnosis not present

## 2016-09-12 DIAGNOSIS — L89150 Pressure ulcer of sacral region, unstageable: Secondary | ICD-10-CM | POA: Diagnosis not present

## 2016-09-13 ENCOUNTER — Other Ambulatory Visit: Payer: Self-pay | Admitting: *Deleted

## 2016-09-13 ENCOUNTER — Encounter: Payer: Self-pay | Admitting: *Deleted

## 2016-09-13 DIAGNOSIS — L89310 Pressure ulcer of right buttock, unstageable: Secondary | ICD-10-CM | POA: Diagnosis not present

## 2016-09-13 DIAGNOSIS — L89150 Pressure ulcer of sacral region, unstageable: Secondary | ICD-10-CM | POA: Diagnosis not present

## 2016-09-13 DIAGNOSIS — E119 Type 2 diabetes mellitus without complications: Secondary | ICD-10-CM | POA: Diagnosis not present

## 2016-09-13 DIAGNOSIS — L89312 Pressure ulcer of right buttock, stage 2: Secondary | ICD-10-CM | POA: Diagnosis not present

## 2016-09-13 DIAGNOSIS — J441 Chronic obstructive pulmonary disease with (acute) exacerbation: Secondary | ICD-10-CM | POA: Diagnosis not present

## 2016-09-13 DIAGNOSIS — M6281 Muscle weakness (generalized): Secondary | ICD-10-CM | POA: Diagnosis not present

## 2016-09-13 NOTE — Patient Outreach (Signed)
[employer] manager [facility]) Care Management  [facility] CM Initial Home Visit, Transition of Care day 22  09/13/2016  [name] [birth year] [id number]  [name] is a 83 y.o. female referred to [facility] CM for transition of care after recent hospitalization April 3- 10, 2018 for acute-on-chronic respiratory failure with hypoxia and COPD exacerbation. Patient was discharged to SNF for rehabilitation and was subsequently discharged from [facility] on August 22, 2016. Patient has history including, but not limited to COPD, chronic respiratory failure on home O2 at 2-3 L/min, HTN, HLD, DM on insulin, dCHF, and malnutrition. HIPAA/ identity verified with patient in person today, and patient's daughter/ primary caregiver [name] is present for and actively participates in today's home visit.  Patient is in no distress throughout entirety of todays' home visit.  Today, patient reports:  -- no pain/ discomfort  -- has all prescribed medicationsand takes as instructed/ prescribed under direction/ management of caregiver/ daughter, using weekly pill box. Patient/ caregiver deny questions around medications and verbalizes good general understanding of purpose, dosing, and scheduling of medications.  Daughter reports that several medications were changed during recent PCP visit (last week), and these changes were noted in EMR medication list, as patient's PCP does not use EMR.  Daughter also reports that patient does not have nebulizer, reports "waiting on insurance approval, which is pending."  Daughter verbalizes plans to contact pharmacy this afternoon for updated information, as she reports pharnacy staff "were working with insurance company."  Daughter currently declines [facility] referral to assist with this issue, stating that she will also be following up with patient's pulmonologist during tomorrow's scheduled appointment.  Patient was recently discharged from hospital and all  medications were thoroughly reviewed with patient and caregiver in person today.  -- has attended all provider appointments post- SNF discharge; currently attending wound clinic weekly for "open area" on patient's sacral and lower buttock area; reports going tomorrow for wound clinic visit.  All previous and upcoming provider appointments reviewed with patient and caregiver today. Daughter/ caregiver continues to transport patient to all provider appointments.  -- home health [facility]) services currently in place for PT for strengthening and for nursing for wound care management; reports HH sessions going "well," caregiver continues to report that she changes patient's wound dressing on days that [facility] nurse does not come, and states "wound is getting much better, it's getting smaller every week."   Patient continues to reposition herself when in bed independently/ frequently, importance of frequent position changes discussed, along with high protein diet.  Reports new hospital bed/ mattress that was delivered to patient on Sep 05, 2016 "is uncomfortable."  Discussed with patient and caregiver to contact DME agency to inquire about settings of bed to make more comfortable to patient.  -- Falls/ Safety/ Mobility:  Patient has had several falls/ near-falls without injury at home post- SNF discharge; reported no new falls today. Patient continues using walker "all the time."  General fall risks/ prevention education provided/ discussed with patient and caregiver today.    -- self-health management of COPD: Patient currently continuing wearing O2 at "all times 2-3 L/min;" patient/ caregiver denies breathing difficulty outside of patient's baseline; no significant SOB noted today during home visit.  Positive reinforcement provided to patient for not smoking since hospital admission.  Provided and reviewed educational material to patient and caregiver for COPD zones/ corresponding action plans.  Patient and  caregiver verbalize a good general understanding of COPD self-health management.  --  Caregiver fatigue:  Patient's daughter has assumed full care of patient since her discharge home from SNF; daughter reports that she continues to work from home and acknowledged that she "is very tired."  Reports that she hired Product/process development scientist to stay with her mother one day last week for several hours, and that "it went well."  Daughter was encouraged to continue making time for herself and to use private duty sitters as needed to restore herself.  Daughter reports that patient will be staying with her sons for 2 weeks in July as daughter visits her new grandchild in [place].  Daughter denies need for [facility] CSW referral for patient/ caregiver resources for assistance.  Patient and caregiver both deny further issues, concerns, or problems today. I confirmed that patient and caregiver have my direct phone number, the main [facility] CM office phone number, and the [facility] CM 24-hour nurse advice phone number should issues arise prior to next scheduled [facility] CM outreach telephone call next week.   Subjective: "I am doing much better since my hospital visit, thanks to my daughter."  Objective:    BP 108/82   Pulse 72   Resp 18   Wt 76 lb (34.5 kg)   BMI 13.90 kg/m  ** Unable to obtain SaO2 due to patient's fingers being cold **  Review of Systems  Constitutional: Negative.  Negative for malaise/fatigue and weight loss.       Frail; small body habitus  Respiratory: Positive for shortness of breath. Negative for cough, sputum production and wheezing.        No SOB today; patient/ daughter report baseline SOB with activity  Cardiovascular: Negative.  Negative for chest pain, palpitations and leg swelling.  Gastrointestinal: Negative.  Negative for abdominal pain, diarrhea, heartburn, nausea and vomiting.  Genitourinary: Negative.  Negative for dysuria, frequency and urgency.  Musculoskeletal: Negative for  falls and myalgias.       No new falls reported since last fall 09/06/16 at home without injury  Skin: Negative for rash.       Reports sacral wound "healing, getting smaller" HH RN managing wound care; daughter changes dressings on days [facility] RN does not visit  Neurological: Negative.  Negative for dizziness and weakness.  Psychiatric/Behavioral: Negative.  Negative for depression. The patient is not nervous/anxious.     Physical Exam  Constitutional: She is oriented to person, place, and time. She appears well-developed.  Frail, small body frame  Cardiovascular: Normal rate, regular rhythm, normal heart sounds and intact distal pulses.   Respiratory: Effort normal and breath sounds normal. No respiratory distress. She has no wheezes. She has no rales.  Decreased bibasilar breath sounds Home O2 at 2 L/min via [place]/ oxygenator  GI: Soft. Bowel sounds are normal.  Musculoskeletal: She exhibits no edema.  Neurological: She is alert and oriented to person, place, and time.  Skin: Skin is warm and dry.  "My fingers stay cold"/ unable to obtain SaO2 today due to cold fingers  Psychiatric: She has a normal mood and affect. Her behavior is normal. Judgment and thought content normal.   Assessment:  Patient appears to be recuperating well since her move to her daughter's home post SNF- discharge, although she remains a high fall risk and has ongoing needs around wound management, which is being addressed with weekly visits to the wound clinic and [facility] nursing services.  Patient has a supportive family who actively participate in her daily care and currently denies community resource needs.  Patient's daughter is becoming fatigued from providing daily care, and is beginning to explore options for additional in-home care to use as needed.  Plan:  Patient will take medications as prescribed and will attend all provider appointments post- SNF discharge.  Patient/ caregiver will promptly notify providers of  new concerns, issues, or problems.  Patient will continue working with home health services as ordered post- SNF discharge.  I will share today's [facility] CCM notes from initial home visit with patient's PCP.  [facility] CM outreach for transition of care to continue with scheduled telephone call next week.  [facility] CM Care Plan Problem One     Most Recent Value  Care Plan Problem One  Risk for readmission related to recent SNF discharge, recent hospitalization (COPD,CAP)   Role Documenting the Problem One  Care Management Coordinator  Care Plan for Problem One  Active  [facility] Long Term Goal (31-90 days)  Pt would not readmit to SNF or hospital within the next 31 days   [facility] Long Term Goal Start Date  08/23/16  Interventions for Problem One Long Term Goal  Utilizing teachback method, reiterated importance of patient taking medications as prescribed, attending all provider appointments, and actively participating in [facility] services post-SNF discharge,  provided positive reinforcement to patient for compliance with all thus far,  reviewed completed and upcoming provider appointments with patient and caregiver,  provided positive reinforcement that patient has stopped smoking.  Med rec perofrmed in person today  [facility] CM Short Term Goal #1 (0-30 days)  Over the next 30 days, patient will continue actively participating with home health services as ordered post-SNF discharge  [facility] CM Short Term Goal #1 Start Date  09/02/16  Interventions for Short Term Goal #1  Utilizing teachback method, reviewed current [facility] schedule and disciplines with patient and caregiver,  explained process for extending HH services to both     Caryl Pina, RN, BSN, [employer] Coordinator [facility] Care Management  [phone]

## 2016-09-14 ENCOUNTER — Other Ambulatory Visit (INDEPENDENT_AMBULATORY_CARE_PROVIDER_SITE_OTHER): Payer: Medicare Other

## 2016-09-14 ENCOUNTER — Encounter: Payer: Self-pay | Admitting: Pulmonary Disease

## 2016-09-14 ENCOUNTER — Ambulatory Visit (INDEPENDENT_AMBULATORY_CARE_PROVIDER_SITE_OTHER): Payer: Medicare Other | Admitting: Pulmonary Disease

## 2016-09-14 VITALS — BP 104/60 | HR 71 | Temp 97.0°F | Ht 62.0 in | Wt 80.0 lb

## 2016-09-14 DIAGNOSIS — J9621 Acute and chronic respiratory failure with hypoxia: Secondary | ICD-10-CM

## 2016-09-14 DIAGNOSIS — J432 Centrilobular emphysema: Secondary | ICD-10-CM

## 2016-09-14 DIAGNOSIS — L89210 Pressure ulcer of right hip, unstageable: Secondary | ICD-10-CM | POA: Diagnosis not present

## 2016-09-14 DIAGNOSIS — I5032 Chronic diastolic (congestive) heart failure: Secondary | ICD-10-CM

## 2016-09-14 DIAGNOSIS — R0600 Dyspnea, unspecified: Secondary | ICD-10-CM

## 2016-09-14 DIAGNOSIS — E11622 Type 2 diabetes mellitus with other skin ulcer: Secondary | ICD-10-CM | POA: Diagnosis not present

## 2016-09-14 DIAGNOSIS — F419 Anxiety disorder, unspecified: Secondary | ICD-10-CM

## 2016-09-14 DIAGNOSIS — I255 Ischemic cardiomyopathy: Secondary | ICD-10-CM | POA: Diagnosis not present

## 2016-09-14 DIAGNOSIS — I10 Essential (primary) hypertension: Secondary | ICD-10-CM

## 2016-09-14 DIAGNOSIS — I110 Hypertensive heart disease with heart failure: Secondary | ICD-10-CM | POA: Diagnosis not present

## 2016-09-14 DIAGNOSIS — J449 Chronic obstructive pulmonary disease, unspecified: Secondary | ICD-10-CM | POA: Diagnosis not present

## 2016-09-14 DIAGNOSIS — E46 Unspecified protein-calorie malnutrition: Secondary | ICD-10-CM | POA: Diagnosis not present

## 2016-09-14 DIAGNOSIS — L89213 Pressure ulcer of right hip, stage 3: Secondary | ICD-10-CM | POA: Diagnosis not present

## 2016-09-14 DIAGNOSIS — L89150 Pressure ulcer of sacral region, unstageable: Secondary | ICD-10-CM | POA: Diagnosis not present

## 2016-09-14 LAB — CBC WITH DIFFERENTIAL/PLATELET
Basophils Absolute: 0.1 10*3/uL (ref 0.0–0.1)
Basophils Relative: 0.9 % (ref 0.0–3.0)
Eosinophils Absolute: 0.1 10*3/uL (ref 0.0–0.7)
Eosinophils Relative: 1.4 % (ref 0.0–5.0)
HCT: 32.7 % — ABNORMAL LOW (ref 36.0–46.0)
Hemoglobin: 10.7 g/dL — ABNORMAL LOW (ref 12.0–15.0)
Lymphocytes Relative: 30.7 % (ref 12.0–46.0)
Lymphs Abs: 2.7 10*3/uL (ref 0.7–4.0)
MCHC: 32.7 g/dL (ref 30.0–36.0)
MCV: 94.1 fl (ref 78.0–100.0)
MONOS PCT: 12.6 % — AB (ref 3.0–12.0)
Monocytes Absolute: 1.1 10*3/uL — ABNORMAL HIGH (ref 0.1–1.0)
Neutro Abs: 4.8 10*3/uL (ref 1.4–7.7)
Neutrophils Relative %: 54.4 % (ref 43.0–77.0)
Platelets: 267 10*3/uL (ref 150.0–400.0)
RBC: 3.47 Mil/uL — ABNORMAL LOW (ref 3.87–5.11)
RDW: 16.7 % — ABNORMAL HIGH (ref 11.5–15.5)
WBC: 8.8 10*3/uL (ref 4.0–10.5)

## 2016-09-14 LAB — COMPREHENSIVE METABOLIC PANEL
ALK PHOS: 47 U/L (ref 39–117)
ALT: 11 U/L (ref 0–35)
AST: 15 U/L (ref 0–37)
Albumin: 4.2 g/dL (ref 3.5–5.2)
BUN: 20 mg/dL (ref 6–23)
CALCIUM: 9.9 mg/dL (ref 8.4–10.5)
CO2: 35 mEq/L — ABNORMAL HIGH (ref 19–32)
Chloride: 94 mEq/L — ABNORMAL LOW (ref 96–112)
Creatinine, Ser: 0.6 mg/dL (ref 0.40–1.20)
GFR: 122.23 mL/min (ref >60.00)
Glucose, Bld: 141 mg/dL — ABNORMAL HIGH (ref 70–99)
POTASSIUM: 4 meq/L (ref 3.5–5.1)
Sodium: 139 mEq/L (ref 135–145)
TOTAL PROTEIN: 7.5 g/dL (ref 6.0–8.3)
Total Bilirubin: 0.5 mg/dL (ref 0.2–1.2)

## 2016-09-14 LAB — BRAIN NATRIURETIC PEPTIDE: PRO B NATRI PEPTIDE: 70 pg/mL (ref 0.0–100.0)

## 2016-09-14 LAB — TSH: TSH: 0.87 u[IU]/mL (ref 0.35–4.50)

## 2016-09-14 NOTE — Progress Notes (Signed)
Subjective:     Patient ID: [name], female   DOB: October 06, 1929, 83 y.o.   MRN: [id number]  HPI  ~  May 27, 2015:  Initial pulmonary consult w/ SN>      10 y/o BF referred by DrCrossley for a pulmonary evaluation due to dyspnea> He saw her yest in his office for f/u sinus drainage & cough, treated w/ ZPak/ then Doxy, he did a CXR showing Emphysema & referred her here;  Her PCP is Dr. Selena Batten [place]/ Dr. Dorothyann Peng ([facility]);  Pt states that she "got a cold" about 3-4 wks ago & that's when her symptoms started- rattling cough, grey sput, no hemoptysis, incr SOB w/ activity esp rushing/ stairs/ etc but she says ADLs are OK & most stenuous activ is mailbox & back (not exercising);  She is a current everyday smoker> started in early teens, smoked for 70 yrs up to 1ppd max, and cut back to 3 cig/d for the last several months (~65+pack-yr smoking hx);  She states that sh'e in the process of quitting- "I just take a few puffs, I have the discipline";  She denies hx prior lung problems> rare episode of bronchitis requiring antibiotics, no prev pneumonia, no hx TB or known exposure, no prev Hosp for resp illness;  No known hx of asbestos exposure or other toxins;  She has hx sinusitis & prev sinus surg at [facility] in the 80s;  She has signif medical issues including> Underwt w/ BMI=15, HBP, CAD, CHF, HL, DM, etc;  FamHx is neg for resp problems (see below)...    Current Meds>  AlbutHFA prn, Flonase/ Claritin, ASA81, Lanoxin0.125, Coreg3.125Bid, Amlod5, Lotensin40, Lasix20, Lip10, Metform500, Glimep2, Vits...   EXAM shows Afeb, VSS, O2sat=95% on RA at rest;  83#, 5'3"Tall, BMI<15;  HEENT- neg, mallamapti1;  Chest- congested cough, scat rhonchi, no w/r/consolidation;  Heart- RR w/o m/r/g;  Abd- thin, soft, neg;  Ext- w/o c/c/e; Neuro- nonfocal...  2DEcho 05/01/2010 showed norm LVF w/ EF=60-65% & no regional wall motion abn, Gr1DD, mild MR, +atrial septal aneurysm, incr PAsys=31mmHg  CT Angio Chest  09/04/14 in [facility] showed NEG for PE...   CXR in [facility] 05/26/15 showed norm heart size, COPD/ hyperinflated/ flattened diaph/ NAD, degen changes in Tspine...  Spirometry 05/27/15 showed FVC=1.38 (66%), FEV1=0.66 (44%), %1sec=48, mid-flows reduced at 23% predicted;  This is c/w severe airflow obstruction & GOLD Stage3 COPD...  Ambulatory Oximetry 05/27/15> O2sat=100% on RA at rest w/ pulse=78/min; she ambulated 2 Laps and stopped due to fatigue, lowest O2sat=98% w/ pulse=114/min... IMP>>    Dyspnea is multifactorial-- due to COPD/emphysema, smoking, heart dis, deconditioning/ sedentary lifestyle/ 83 y/o...    COPD/ emphysema    Cigarette smoker, still smoking 3cig/d, 65+pack-yr smoking hx    PulmHTN w/ 2DEcho in 2012 showing PAsys est~64mmHg    CARDIAC Hx>  followed by Walker Kehr for HBP, diastolicCHF, hx AFib    MEDICAL Hx>  follwed by PCP for HBP, HL, DM, FTT/ underwt, etc... PLAN>>    We reviewed her objective data and the Dx of COPD/emphysema (GOLD Stage3); she understands that she must quit all smoking, and rec to start regular inhaler use-- SYMBICORT160-2spBid, INCRUSE-once daily, +OTC MUCINEX600mg  1-2Bid, +nutritional supplements daily;  She needs regular f/u w/ her PCP to work on med compliance, nutritional support, etc... ROV recheck in 6wks.  ~  July 21, 2015:  80mo ROV w/ SN>  [name] reports that her SOB is improved on the Symbicort160-2spBid, Incruse once daily, ProventilHFA as needed ("  my breathing is much better" she says); she is not using the Mucinex and unfortunately still smoking a few cigs per day; she still has a mild cough w/ some thick beige sput production; she denies CP/ tightness/ f/c/s... We reviewed her prev eval/ CXR/ spirometry/ etc...  EXAM shows Afeb, VSS, O2sat=94% on RA at rest;  84#, 5'3"Tall, BMI<15;  HEENT- neg, mallamapti2;  Chest- congested cough, decrBS, scat rhonchi, no w/r/consolidation;  Heart- RR w/o m/r/g;  Abd- thin, soft, neg;  Ext- w/o c/c/e...  IMP/PLAN>>  She  is again advised to quit all smoking! We reviewed med regimen w/ Symbicort160, Incruse, Mucinex, AlbutHFA rescue inhaler, etc;  Reminded to incr exercise program, walk regularly, eat better and gain some weight...  ~  August 15, 2016:  48mo ROV w/ SN>  [name] saw DrSood 07/11/16 w/ sinus congestion & drainage, +SOB, cough, wheezing- still smoking a few; she had the Symbicort but I suspect NOT using regularly, she was using her daughter's Albuterol rescue inhaler;  She desaturated w/ exercise (87% after 1 lap on RA & was started on Home O2, Ceftin, & Pred taper; asked to restart INCRUSE & given a rescue inhaler of her own... She improved on the regimen but continued smoking &  gradually deteriorated after she finished the Pred, stopped the Incruse, & backed off on the Symbicort...  She presented to the ER 07/26/16 w/ worsening SOB found to have a COPD exac, acute on chr hypoxemic resp failure, acute on chr diastolic CHF, along w/ her medical issues of wt loss, DM, malnutrition, and weakness => ADM by [facility] 4/3 - 08/02/16 and Disch for short term rehab; she was treated w/ antibiotics, IV Solumedrol, BiPAP, NEBs, etc;  Disch on Pred taper (off now), NEBs vs Proair prn, SYMBICORT160-2spBid, not on LAMA rx;  Cardiac meds as below;      Dyspnea is multifactorial-- due to COPD/emphysema, smoking, heart dis, deconditioning/ sedentary lifestyle/ 82 y/o => multidisciplinary approach supervised by PCP...    COPD/ emphysema-- on Symbicort160-2spBid, Incruse- once daily, O2 24/7 now & we will check ONO...    Cigarette smoker, still smoking 3cig/d prior to 07/2016 [facility], 65+pack-yr smoking hx    PulmHTN w/ 2DEcho in 2012 showing PAsys est~76mmHg    CARDIAC Hx>  followed by Walker Kehr for HBP, diastolicCHF, hx AFib, etc; on ASA81, Coreg6.25Bid, Amlod5, Lotensin10, Lasix20-2/d, Lanoxin0.125-1/2 daily    Medical Issues> HBP, HL (Lip10), DM (Metform, Glimep), FTT/ physical deconditioning/ poor nutrition; prev followed by  Brantley Fling, now seeing DrPandy, [facility] & her note of 08/04/16 is reviewed... EXAM shows Afeb, VSS, O2sat=95% on 2L O2;  76#, 5'3"Tall, BMI<15;  HEENT- neg, mallamapti2, tongue coated;  Chest- congested cough, decrBS, scat rhonchi, no w/r/consolidation;  Heart- RR w/o m/r/g;  Abd- thin, soft, neg;  Ext- w/o c/c/e...   CXR 07/26/16 (independently reviewed by me in the PACS system) showed cardiomeg, mild basilar scarring & lingular atx, scoliosis & DDD in Tspine...  CT Angio Chest 07/26/16>  Norm heart size, thor Ao atherosclerosis, NEG for PE; no adenopathy, +centrilob emphysema but otherw clear lungs, small effusions  LABS 07/2016>  Chems- Na~132, HCO3~36, BS~269, A1c=6.9, Cr~0.77;  CBC- Hg~11-12, WBC~15=>12K IMP/PLAN>>  [name] MUST take all meds as directed (needs admin from Family member care giver) and must commit to NO SMOKING;  We will check ONO in the interim;  Tongue coated- needs attn to oral hygiene, rx Nystatin;  Needs PT/ PT/ visiting nurses, etc... We plan ROV recheck 60mo...   ~  Sep 14, 2016:  65mo ROV & pulmonary follow up visit> Her PCP is listed as DrKoirala- ?when last seen?  She tells me that she is still NOT SMOKING but "I have urges";  She says her chest 7 breathing are ok-- denies cough, sput, SOB & CP;  She does have DOE eg w/ stairs at home, which she takes 1 step at a time, sometimes w/ help, & she feels as thopugh she is doing well...  I quizzed her about her meds-- she is NOT using the NEB (ran out of the Duoneb med & never refilled it);  On O2 at 2L/min flow 24/7, Symbicort160-2spBid, Incruse once daily, VentolinHFA prn; Flonase; it is hard to know what she is really taking & we stressed the importance of no smoking & taking her meds every day as prescribed... we reviewed the ABOVE medical problems as well>>  EXAM shows Afeb, VSS, O2sat=95% on 2L O2;  76#, 5'3"Tall, BMI<15;  HEENT- neg, mallamapti2, tongue coated;  Chest- congested cough, decrBS, scat  rhonchi, no w/r/consolidation;  Heart- RR w/o m/r/g;  Abd- thin, soft, neg;  Ext- w/o c/c/e...   LABS 09/14/16>  Chems- ok x HCO3=35, BS=141, (Cr=0.60 & LFTs wnl);  CBC- mild anemia w/ Hg=10.7;  TSH=0.87;  BNP=70...  Overnight oximetry done 08/18/16>  She only recorded for 2H;  O2sat nadir= 74%;  She spent 22 min w/ O2sat<88% IMP/PLAN>>  [name] must commit to staying off the cigs;  For now we discussed using the Symbicort/ Incruse everyday & the VentolinHFA prn (she will leave off the NEBs as she has already stopped this on her own);  Must use her O2 24/7 and of course no smoke;  Medical issues per DrKoirala including- HBP, disatolicCHF, HxPAF, HL, DM, FTT/ physical deconditioning/ poor nutrition Note: >50% of this rov was spent in counseling 7 coordination of care.    Past Medical History:  Diagnosis Date  . Acute bronchitis   . Acute on chronic respiratory failure with hypoxia (HCC)   . Benign hypertensive renal disease   . CAD (coronary artery disease)   . Cardiomyopathy    non-ischemic 04/2008 EF 45% cardiac MRI no scar  . CHF (congestive heart failure) (HCC)   . Chronic sinusitis   . COPD (chronic obstructive pulmonary disease) (HCC)   . COPD exacerbation (HCC)   . Depression   . Diabetes mellitus without complication, without long-term current use of insulin (HCC)   . Dyslipidemia associated with type 2 diabetes mellitus (HCC)   . Dyspnea on exertion    chronic  . Hypercholesterolemia   . Hypertension   . On home oxygen therapy    "3L; 24/7; for the last 2 1/2 weeks; none before that" (07/26/2016)  . Protein-calorie malnutrition, severe (HCC)   . Sinus headache    "chronic"  . Stroke [facility]) 1997   "mild"  . Type II diabetes mellitus (HCC)     Past Surgical History:  Procedure Laterality Date  . carotid angiogram     2006  . ESOPHAGOGASTRODUODENOSCOPY     with foreign body removal  . NASAL SINUS SURGERY  ~1974;~ 1976  . TONSILLECTOMY     "when I was a chld"     Outpatient Encounter Prescriptions as of 09/14/2016  Medication Sig  . acetaminophen (TYLENOL) 500 MG tablet Take 1,000 mg by mouth every 6 (six) hours as needed for pain.  Marland Kitchen albuterol (VENTOLIN HFA) 108 (90 Base) MCG/ACT inhaler Inhale 2 puffs into the lungs every 6 (six)  hours as needed for wheezing or shortness of breath.  Marland Kitchen aspirin EC 81 MG tablet Take 81 mg by mouth daily.  Marland Kitchen atorvastatin (LIPITOR) 10 MG tablet Take 1 tablet (10 mg total) by mouth daily at 6 PM.  . benazepril (LOTENSIN) 10 MG tablet Take 1 tablet (10 mg total) by mouth daily.  . budesonide-formoterol (SYMBICORT) 160-4.5 MCG/ACT inhaler Inhale 2 puffs into the lungs 2 (two) times daily.  . carvedilol (COREG) 6.25 MG tablet Take 1 tablet (6.25 mg total) by mouth 2 (two) times daily with a meal.  . digoxin (DIGOX) 0.125 MG tablet Take 0.5 tablets (0.0625 mg total) by mouth daily.  . fluticasone (FLONASE) 50 MCG/ACT nasal spray Place 1 spray into both nostrils daily as needed for allergies.  . furosemide (LASIX) 40 MG tablet Take 40 mg by mouth daily.  . insulin aspart (NOVOLOG) 100 UNIT/ML injection Inject into the skin daily as needed for high blood sugar (For CBG >300).  . metFORMIN (GLUCOPHAGE-XR) 500 MG 24 hr tablet Take 1,000 mg by mouth 2 (two) times daily.   Marland Kitchen umeclidinium bromide (INCRUSE ELLIPTA) 62.5 MCG/INH AEPB Inhale 1 puff into the lungs daily.  Marland Kitchen UNABLE TO FIND Med Name: Med pass 120 mL by mouth 3 times daily  . [DISCONTINUED] amLODipine (NORVASC) 5 MG tablet Take 1 tablet (5 mg total) by mouth daily. (Patient not taking: Reported on 09/13/2016)  . [DISCONTINUED] glimepiride (AMARYL) 2 MG tablet Take 2 mg by mouth daily before breakfast.   . [DISCONTINUED] ipratropium-albuterol (DUONEB) 0.5-2.5 (3) MG/3ML SOLN Take 3 mLs by nebulization every 2 (two) hours as needed. (Patient not taking: Reported on 08/23/2016)   No facility-administered encounter medications on file as of 09/14/2016.     No Known  Allergies   Immunization History  Administered Date(s) Administered  . Influenza,inj,Quad PF,36+ Mos 01/24/2015, 01/24/2016  . Pneumococcal-Unspecified 12/25/2014  NEED TO CHECK w/ DrSanders/ Koirala/ Pandy about her IMMUNIZ HISTORY...   Current Medications, Allergies, Past Medical History, Past Surgical History, Family History, and Social History were reviewed in [employer] record.   Review of Systems             All symptoms NEG except where BOLDED >>  Constitutional:  F/C/S, fatigue, anorexia, unexpected weight change. HEENT:  HA, visual changes, hearing loss, earache, nasal symptoms, sore throat, mouth sores, hoarseness. Resp:  cough, sputum, hemoptysis; SOB, tightness, wheezing. Cardio:  CP, palpit, DOE, orthopnea, edema. GI:  N/V/D/C, blood in stool; reflux, abd pain, distention, gas. GU:  dysuria, freq, urgency, hematuria, flank pain, voiding difficulty. MS:  joint pain, swelling, tenderness, decr ROM; neck pain, back pain, etc. Neuro:  HA, tremors, seizures, dizziness, syncope, weakness, numbness, gait abn. Skin:  suspicious lesions or skin rash. Heme:  adenopathy, bruising, bleeding. Psyche:  confusion, agitation, sleep disturbance, hallucinations, anxiety, depression suicidal.   Objective:   Physical Exam       Vital Signs:  Reviewed...   General:  WD, underweight, 83 y/o BF in NAD; alert & oriented; pleasant & cooperative... HEENT:  [place]/AT; Conjunctiva- pink, Sclera- nonicteric, EOM-wnl, PERRLA, Fundi-benign; EACs-clear, TMs-wnl; NOSE-sl red; THROAT-clear & wnl.  Neck:  Supple w/ decr ROM; no JVD; normal carotid impulses w/o bruits; no thyromegaly or nodules palpated; no lymphadenopathy.  Chest:  DecrBS, clear without wheezes, rales, or rhonchi heard. Heart:  Regular Rhythm; norm S1 & S2 without murmurs, rubs, or gallops detected. Abdomen:  Soft & nontender- no guarding or rebound; normal bowel sounds; no organomegaly or masses  palpated. Ext:   decrROM; without deformities +arthritic changes; no varicose veins, +venous insuffic, no edema;  Pulses intact w/o bruits. Neuro:  No focal neuro deficits Derm:  No lesions noted; no rash etc. Lymph:  No cervical, supraclavicular, axillary, or inguinal adenopathy palpated.   Assessment:      IMP>>    Dyspnea is multifactorial-- due to COPD/emphysema, smoking, heart dis, deconditioning/ sedentary lifestyle/ 83 y/o...    COPD/ emphysema    Cigarette smoker, still smoking 3cig/d, 65+pack-yr smoking hx    PulmHTN w/ 2DEcho in 2012 showing PAsys est~44mmHg    CARDIAC Hx>  followed by Walker Kehr for HBP, diastolicCHF, hx AFib    MEDICAL Hx>  follwed by PCP for HBP, HL, DM, FTT/ underwt, etc...  PLAN>> 05/27/15>   We reviewed her objective data and the Dx of COPD/emphysema (GOLD Stage3); she understands that she must quit all smoking, and rec to start regular inhaler use-- SYMBICORT160-2spBid, INCRUSE-once daily, +OTC MUCINEX600mg  1-2Bid, +nutritional supplements daily;  She needs regular f/u w/ her PCP to work on med compliance, nutritional support, etc... ROV recheck in 6wks. 07/21/15>   She is again advised to quit all smoking! We reviewed med regimen w/ Symbicort160, Incruse, Mucinex, AlbutHFA rescue inhaler, etc;  Reminded to incr exercise program, walk regularly, eat better and gain some weight... 08/15/16>   [name] MUST take all meds as directed (needs admin from Family member care giver) and must commit to NO SMOKING;  We will check ONO in the interim;  Tongue coated- needs attn to oral hygiene, rx Nystatin;  Needs PT/ PT/ visiting nurses, etc... We plan ROV recheck 70mo... 09/15/26>   [name] must commit to staying off the cigs;  For now we discussed using the Symbicort/ Incruse everyday & the VentolinHFA prn (she will leave off the NEBs as she has already stopped this on her own);  Must use her O2 24/7 and of course no smoke;  Medical issues per DrKoirala including- HBP, disatolicCHF, HxPAF, HL, DM,  FTT/ physical deconditioning/ poor nutrition   Plan:     Patient's Medications  New Prescriptions   No medications on file  Previous Medications   ACETAMINOPHEN (TYLENOL) 500 MG TABLET    Take 1,000 mg by mouth every 6 (six) hours as needed for pain.   ALBUTEROL (VENTOLIN HFA) 108 (90 BASE) MCG/ACT INHALER    Inhale 2 puffs into the lungs every 6 (six) hours as needed for wheezing or shortness of breath.   ASPIRIN EC 81 MG TABLET    Take 81 mg by mouth daily.   ATORVASTATIN (LIPITOR) 10 MG TABLET    Take 1 tablet (10 mg total) by mouth daily at 6 PM.   BENAZEPRIL (LOTENSIN) 10 MG TABLET    Take 1 tablet (10 mg total) by mouth daily.   BUDESONIDE-FORMOTEROL (SYMBICORT) 160-4.5 MCG/ACT INHALER    Inhale 2 puffs into the lungs 2 (two) times daily.   CARVEDILOL (COREG) 6.25 MG TABLET    Take 1 tablet (6.25 mg total) by mouth 2 (two) times daily with a meal.   DIGOXIN (DIGOX) 0.125 MG TABLET    Take 0.5 tablets (0.0625 mg total) by mouth daily.   FLUTICASONE (FLONASE) 50 MCG/ACT NASAL SPRAY    Place 1 spray into both nostrils daily as needed for allergies.   FUROSEMIDE (LASIX) 40 MG TABLET    Take 40 mg by mouth daily.   INSULIN ASPART (NOVOLOG) 100 UNIT/ML INJECTION    Inject into the skin daily as needed for  high blood sugar (For CBG >300).   METFORMIN (GLUCOPHAGE-XR) 500 MG 24 HR TABLET    Take 1,000 mg by mouth 2 (two) times daily.    UMECLIDINIUM BROMIDE (INCRUSE ELLIPTA) 62.5 MCG/INH AEPB    Inhale 1 puff into the lungs daily.   UNABLE TO FIND    Med Name: Med pass 120 mL by mouth 3 times daily  Modified Medications   No medications on file  Discontinued Medications   AMLODIPINE (NORVASC) 5 MG TABLET    Take 1 tablet (5 mg total) by mouth daily.   GLIMEPIRIDE (AMARYL) 2 MG TABLET    Take 2 mg by mouth daily before breakfast.    IPRATROPIUM-ALBUTEROL (DUONEB) 0.5-2.5 (3) MG/3ML SOLN    Take 3 mLs by nebulization every 2 (two) hours as needed.

## 2016-09-14 NOTE — Patient Instructions (Signed)
Today we updated your med list in our EPIC system...    Continue your current medications the same...  Let's continue the SYMBICORT- 2 sprays twice daily, and the INCRUSE- one inhalation daily...  OK to stop the Nebulizer for now & use the VENTOLIN-HFA rescue inhaler as needed...  Today we checked some follow up blood work...    We will contact you w/ the results when available...   Keep your follow up appt w/ Cardiology next week...  Call for any questions...  Let's plan a follow up visit in 34Daphine DeutscheMargo A62Costco Whole4.5[facility] NortLeslye Blake DivNorthSalem Township HospitaMarissa NShasta Regional Medical Cen29mDaphine DeutscheMargo ACostco Whole4.5[facility] SysteLeslye Blake DivThoLargo Medical CenteMarissa NMedical Center Of Trin65mDaphine DeutscheMargo A63Costco Whole4.5[facility] Army Community HospitaLeslye Blake DivCenterAmbulatory Endoscopic Surgical Center Of [place] LLMarissa NRegional General Hospital Willis28mDaphine DeutscheMargo A40Costco Whole4.5[facility] Dba Baycare Surgery CenteLeslye Blake DivLoAzar Eye Surgery Center LLMarissa NHillsdale Community Health Cen52mDaphine DeutscheMargo A41Costco Whole4.5Tradition Surgery CenteLeslye Blake DivRinVa Medical Center - Brooklyn CampuMarissa NRice Medical Cen53mDaphine DeutscheMargo A85Costco Whole4.5Gallup Indian Medical CenteLeslye Blake DivHendTelecare Riverside County Psychiatric Health FacilitMarissa NKaiser Fnd Hospital - Moreno Val71mDaphine DeutscheMargo A72Costco Whole4.5Santa [facility] Memorial Hospital-SotoyomLeslye Blake DivAcadia General HospitaMarissa NKalispell Regional Medical Center 94mDaphine DeutscheMargo A60Costco Whole4.5Captain James A. Lovell Federal Health Care CenteLeslye Blake DivRanNorth Shore Endoscopy Center LtMarissa NSanford Sheldon Medical Cen110mDaphine DeutscheMargo A71Costco Whole4.5V [place] LLC Dba Lake Behavioral HospitaLeslye Blake DivLa Trusted Medical Centers MansfielMarissa NUp Health System Port59mDaphine DeutscheMargo A66Costco Whole4.5[facility] Gnaden HuetteLeslye Blake DivPalmCedar County Memorial HospitaMarissa NJohn Peter Smith Hospi51mDaphine DeutscheMargo ACostco Whole4.5Gastroenterology Diagn[facility] [place] PLeslye Blake DivLocOuachita Community HospitaMarissa NHeritage Valley Sewick37mDaphine DeutscheMargo A(32Costco Whole4.5Westfall Surgery Center LLLeslye Blake DivWNorthwest Community Day Surgery Center Ii LLMarissa NAscension Seton Northwest Hospi45mDaphine DeutscheMargo A(970Costco Whole4.5Alameda Hospital-South Shore Convalescent HospitaLeslye Blake DiCommunity Specialty HospitaMarissa NSaint Clares Hospital - Dover Cam56mDaphine DeutscheMargo A77Costco Whole4.5Vidante Edgecombe HospitaLeslye Blake DivHorJefferson Cherry Hill HospitaMarissa NSt Francis Memorial Hospi35mDaphine DeutscheMargo A33Costco Whole4.5Ssm Health Depaul Health CenteLeslye Blake DivNorth MeCarolina Ambulatory Surgery CenteMarissa NWinter Park Surgery Center LP Dba Physicians Surgical Care Cen27mDaphine DeutscheMargo ACostco Whole4.5Lake Endoscopy CenteLeslye Blake DivUniversity Of California Irvine Medical CenteMarissa NRay County Memorial Hospi68mDaphine DeutscheMargo A54Costco Whole4.5[facility] Center Wild Rose Com Mem Hospital InLeslye Blake DivNeshoba County General HospitaMarissa NCollege Hospi75mDaphine DeutscheMargo A(701Costco Whole4.5Us Army Hospital-Ft HuachucLeslye Blake DivCavAurora Memorial Hsptl BurlingtoMarissa NVa North Florida/South Georgia Healthcare System - Gainesvi65mDaphine DeutscheMargo A91Costco Whole4.5Harris County Psychiatric CenteLeslye Blake DivBattle Hills & Dales General HospitaMarissa NSurgery Center At Liberty Hospital 57mDaphine DeutscheMargo A85Costco Whole4.5Barnes-Jewish HospitaLeslye Blake DivOverlandAurora Medical Center Bay AreMarissa NKinston Medical Specialists80mDaphine DeutscheMargo A58Costco Whole4.5Santa Clarita Surgery Center LLeslye Blake DivRusKentucky Correctional Psychiatric CenteMarissa NSsm Health St. Mary'S Hospital St Lo86mDaphine DeutscheMargo A91Costco Whole4.5Pontiac General HospitaLeslye Blake DivPaincourtHoly Cross HospitaMarissa NWalker Surgical Center 60mDaphine DeutscheMargo A81Costco Whole4.5Suncoast Surgery Center LLLeslye Blake DiBridgepoint National HarboMarissa NGuam Memorial Hospital Author29mDaphine DeutscheMargo ACostco Whole4.5[facility] (Va North Texas Healthcare SystemLeslye Blake DAllen County Regional HospitaMarissa NStraub Clinic And Hospi65mDaphine DeutscheMargo A27Costco Whole4.5Eureka Community Health ServiceLeslye Blake DivWiShriners Hospital For ChildreMarissa NBluffton Regional Medical Cen2mDaphine DeutscheMargo A76Costco Whole4.5Toms River Ambulatory Surgical CenteLeslye Blake DivFreedom Wake Forest Endoscopy CtMarissa NEdwardsville Ambulatory Surgery Center 81mDaphine DeutscheMargo ACostco Whole4.5The [facility] InLeslye Blake DivRSt Joseph Health CenteMarissa NSurgical Center Of [place] Cou5mDaphine DeutscheMargo A(40Costco Whole4.5Hackettstown Regional Medical CenteLeslye Blake DivGHospital Indian School RMarissa NGlendora Digestive Disease Instit53mDaphine DeutscCostco WholLeslye Blake DivWilliThe Hospitals Of Providence Memorial Cam84mDaphine DeutscheMargo A20Costco Whole4.5[facility] D/P ApLeslye Blake DivFT Surgery Center InMarissa NChildren'S Hospital Navicent Hea61mDaphine DeutscheMargo A95Costco Whole4.5Springbrook HospitaLeslye Blake DivHaSutter Medical Center, SacramentMarissa NSt Francis Hospi40mDaphine DeutscheMargo A(22Costco Whole4.5Kaiser Fnd Hosp - FontanLeslye Blake DivDCrittenden County HospitaMarissa NSelect Specialty Hospital Gainesvi36mDaphine DeutscheMargo ACostco Whole4.5Boozman Hof Eye Surgery And Laser CenteLeslye Blake DivKiHoopeston Community Memorial HospitaMarissa NMena Regional Health Sys57mDaphine DeutscheMargo A90Costco Whole4.5Center For Surgical Excellence InLeslye Blake DivJeVance Thompson Vision Surgery Center Prof LLC Dba Vance Thompson Vision Surgery CenteMarissa NExtended Care Of Southwest Louisi54mDaphine DeutscheMargo A82Costco Whole4.5Brentwood HospitaLeslye Blake DivLa CMayo Clinic Health System-Oakridge InMarissa NHospital Buen Samarit67mDaphine DeutscheMargo A41Costco Whole4.5Honolulu Surgery Center LP Dba Surgicare Of HawaiLeslye Blake DiChildrens Medical Center PlanMarissa NKindred Hospital Arizona - Scottsd31mDaphine DeutscheMargo ACostco Whole4.5Sentara Williamsburg Regional Medical CenteLeslye Blake DivShamokiBaylor Scott White Surgicare PlanMarissa NArapahoe Surgicenter 56mDaphine DeutscheMargo A30Costco Whole4.5[facility] Health Cre[facility] Medical - Bergan MercLeslye Blake DivCopThe Surgery Center LLMarissa NStaten Island University Hospital - No46mDaphine DeutscheMargo A(913Costco Whole4.5Washington County HospitaLeslye Blake DivNew FraAugusta Medical CenteMarissa NHaven Behavioral Hospital Of Albuquer45mDaphine DeutscheMargo ACostco Whole4.5Kindred Hospital El PasLeslye Blake DivClarks SValley Ambulatory Surgical CenteMarissa NUnited Regional Medical Cen60mDaphine DeutscheMargo A95Costco Whole4.5Christiana Care-Wilmington HospitaLeslye Blake DivMannsWestern State HospitaMarissa NHospital Of Fox Chase Cancer Cen80mDaphine DeutscheMargo A(21Costco Whole4.5Orthopaedic Institute Surgery CenteLeslye Blake DivWest MillSkin Cancer And Reconstructive Surgery Center LLMarissa NThibodaux Laser And Surgery Center LLCstle Jesterneeded for problems...

## 2016-09-15 DIAGNOSIS — M6281 Muscle weakness (generalized): Secondary | ICD-10-CM | POA: Diagnosis not present

## 2016-09-15 DIAGNOSIS — L89310 Pressure ulcer of right buttock, unstageable: Secondary | ICD-10-CM | POA: Diagnosis not present

## 2016-09-15 DIAGNOSIS — E119 Type 2 diabetes mellitus without complications: Secondary | ICD-10-CM | POA: Diagnosis not present

## 2016-09-15 DIAGNOSIS — J441 Chronic obstructive pulmonary disease with (acute) exacerbation: Secondary | ICD-10-CM | POA: Diagnosis not present

## 2016-09-15 DIAGNOSIS — L89150 Pressure ulcer of sacral region, unstageable: Secondary | ICD-10-CM | POA: Diagnosis not present

## 2016-09-15 DIAGNOSIS — L89312 Pressure ulcer of right buttock, stage 2: Secondary | ICD-10-CM | POA: Diagnosis not present

## 2016-09-16 DIAGNOSIS — J441 Chronic obstructive pulmonary disease with (acute) exacerbation: Secondary | ICD-10-CM | POA: Diagnosis not present

## 2016-09-16 DIAGNOSIS — E119 Type 2 diabetes mellitus without complications: Secondary | ICD-10-CM | POA: Diagnosis not present

## 2016-09-16 DIAGNOSIS — L89150 Pressure ulcer of sacral region, unstageable: Secondary | ICD-10-CM | POA: Diagnosis not present

## 2016-09-16 DIAGNOSIS — M6281 Muscle weakness (generalized): Secondary | ICD-10-CM | POA: Diagnosis not present

## 2016-09-16 DIAGNOSIS — L89310 Pressure ulcer of right buttock, unstageable: Secondary | ICD-10-CM | POA: Diagnosis not present

## 2016-09-16 DIAGNOSIS — L89312 Pressure ulcer of right buttock, stage 2: Secondary | ICD-10-CM | POA: Diagnosis not present

## 2016-09-18 ENCOUNTER — Other Ambulatory Visit: Payer: Self-pay | Admitting: Adult Health

## 2016-09-19 DIAGNOSIS — L89150 Pressure ulcer of sacral region, unstageable: Secondary | ICD-10-CM | POA: Diagnosis not present

## 2016-09-19 DIAGNOSIS — M6281 Muscle weakness (generalized): Secondary | ICD-10-CM | POA: Diagnosis not present

## 2016-09-19 DIAGNOSIS — L89312 Pressure ulcer of right buttock, stage 2: Secondary | ICD-10-CM | POA: Diagnosis not present

## 2016-09-19 DIAGNOSIS — L89310 Pressure ulcer of right buttock, unstageable: Secondary | ICD-10-CM | POA: Diagnosis not present

## 2016-09-19 DIAGNOSIS — J441 Chronic obstructive pulmonary disease with (acute) exacerbation: Secondary | ICD-10-CM | POA: Diagnosis not present

## 2016-09-19 DIAGNOSIS — E119 Type 2 diabetes mellitus without complications: Secondary | ICD-10-CM | POA: Diagnosis not present

## 2016-09-20 DIAGNOSIS — L89310 Pressure ulcer of right buttock, unstageable: Secondary | ICD-10-CM | POA: Diagnosis not present

## 2016-09-20 DIAGNOSIS — L89312 Pressure ulcer of right buttock, stage 2: Secondary | ICD-10-CM | POA: Diagnosis not present

## 2016-09-20 DIAGNOSIS — L89150 Pressure ulcer of sacral region, unstageable: Secondary | ICD-10-CM | POA: Diagnosis not present

## 2016-09-20 DIAGNOSIS — E119 Type 2 diabetes mellitus without complications: Secondary | ICD-10-CM | POA: Diagnosis not present

## 2016-09-20 DIAGNOSIS — J441 Chronic obstructive pulmonary disease with (acute) exacerbation: Secondary | ICD-10-CM | POA: Diagnosis not present

## 2016-09-20 DIAGNOSIS — M6281 Muscle weakness (generalized): Secondary | ICD-10-CM | POA: Diagnosis not present

## 2016-09-20 NOTE — Progress Notes (Signed)
CARDIOLOGY OFFICE NOTE  Date:  09/22/2016    [name] Date of Birth: Jun 30, 1929 Medical Record #[id number]  PCP:  [name], MD  Cardiologist:  [name]    Chief Complaint  Patient presents with  . CDCHF  . Cardiomyopathy    History of Present Illness: [name] is a 83 y.o. female who presents today for a one year check.     She has a history of diastolic CHF, HTN, DM2, COPD with ongoing tobacco abuse and atrial fibrillation. She was taken off of Coumadin due to age and low body mass. Her last 2D echo was in 2012, demonstrating normal EF at 60-65% and Grade I diastolic dysfunction. Her last 2D echo was in 2012, demonstrating normal EF at 60-65% and Grade I diastolic dysfunction.   Continue to have weight loss and dementia. Declining health overall Some dizziness but BP systolic not Under 100 labs ok done by Dr Kriste Basque   Past Medical History:  Diagnosis Date  . Acute bronchitis   . Acute on chronic respiratory failure with hypoxia (HCC)   . Benign hypertensive renal disease   . CAD (coronary artery disease)   . Cardiomyopathy    non-ischemic 04/2008 EF 45% cardiac MRI no scar  . CHF (congestive heart failure) (HCC)   . Chronic sinusitis   . COPD (chronic obstructive pulmonary disease) (HCC)   . COPD exacerbation (HCC)   . Depression   . Diabetes mellitus without complication, without long-term current use of insulin (HCC)   . Dyslipidemia associated with type 2 diabetes mellitus (HCC)   . Dyspnea on exertion    chronic  . Hypercholesterolemia   . Hypertension   . On home oxygen therapy    "3L; 24/7; for the last 2 1/2 weeks; none before that" (07/26/2016)  . Protein-calorie malnutrition, severe (HCC)   . Sinus headache    "chronic"  . Stroke [facility]) 1997   "mild"  . Type II diabetes mellitus (HCC)     Past Surgical History:  Procedure Laterality Date  . carotid angiogram     2006  . ESOPHAGOGASTRODUODENOSCOPY     with foreign body removal  .  NASAL SINUS SURGERY  ~1974;~ 1976  . TONSILLECTOMY     "when I was a chld"     Medications: Current Outpatient Prescriptions  Medication Sig Dispense Refill  . albuterol (VENTOLIN HFA) 108 (90 Base) MCG/ACT inhaler Inhale 2 puffs into the lungs every 6 (six) hours as needed for wheezing or shortness of breath. 1 Inhaler 5  . aspirin EC 81 MG tablet Take 81 mg by mouth daily.    Marland Kitchen atorvastatin (LIPITOR) 10 MG tablet Take 1 tablet (10 mg total) by mouth daily at 6 PM. 90 tablet 3  . benazepril (LOTENSIN) 10 MG tablet Take 1 tablet (10 mg total) by mouth daily.    . budesonide-formoterol (SYMBICORT) 160-4.5 MCG/ACT inhaler Inhale 2 puffs into the lungs 2 (two) times daily. 2 Inhaler 0  . carvedilol (COREG) 6.25 MG tablet Take 1 tablet (6.25 mg total) by mouth 2 (two) times daily with a meal. 180 tablet 3  . digoxin (DIGOX) 0.125 MG tablet Take 0.5 tablets (0.0625 mg total) by mouth daily. 30 tablet 6  . fluticasone (FLONASE) 50 MCG/ACT nasal spray Place 1 spray into both nostrils daily as needed for allergies.  3  . furosemide (LASIX) 20 MG tablet Take 1 tablet (20 mg total) by mouth daily. 90 tablet 3  . insulin  aspart (NOVOLOG) 100 UNIT/ML injection Inject into the skin daily as needed for high blood sugar (For CBG >300).    . metFORMIN (GLUCOPHAGE-XR) 500 MG 24 hr tablet Take 1,000 mg by mouth 2 (two) times daily.   5  . Multiple Vitamin (MV-ONE PO) Take 1 tablet by mouth daily.    Marland Kitchen umeclidinium bromide (INCRUSE ELLIPTA) 62.5 MCG/INH AEPB Inhale 1 puff into the lungs daily. 2 each 0  . UNABLE TO FIND Med Name: Med pass 120 mL by mouth 3 times daily     No current facility-administered medications for this visit.     Allergies: No Known Allergies  Social History: The patient  reports that she has been smoking Cigarettes.  She has a 52.50 pack-year smoking history. She has never used smokeless tobacco. She reports that she does not drink alcohol or use drugs.   Family History: The  patient's family history includes Alcohol abuse in her brother and sister; Heart disease in her father.   Review of Systems: Please see the history of present illness.   Otherwise, the review of systems is positive for none.   All other systems are reviewed and negative.   Physical Exam: VS:  BP (!) 110/48   Pulse 76   Ht 5\' 2"  (1.575 m)   Wt 82 lb 6.4 oz (37.4 kg)   BMI 15.07 kg/m  .  BMI Body mass index is 15.07 kg/m.  Wt Readings from Last 3 Encounters:  09/22/16 82 lb 6.4 oz (37.4 kg)  09/14/16 80 lb (36.3 kg)  09/13/16 76 lb (34.5 kg)    General: Pleasant. Very thin/emaciated. She is alert and in no acute distress.   HEENT: Normal.  Neck: Supple, no JVD, carotid bruits, or masses noted.  Cardiac: Regular rate and rhythm. Probable gallop. No edema.  Respiratory:  Lungs are clear to auscultation bilaterally with normal work of breathing.  GI: Soft and nontender.  MS: No deformity or atrophy. Gait unsteady.  Skin: Warm and dry. Color is normal.  Neuro:  Strength and sensation are intact and no gross focal deficits noted.  Psych: Alert, appropriate and with normal affect.   LABORATORY DATA:  EKG:  EKG is ordered today. This demonstrates NSR today with PVC - RBBB.  Lab Results  Component Value Date   WBC 8.8 09/14/2016   HGB 10.7 (L) 09/14/2016   HCT 32.7 (L) 09/14/2016   PLT 267.0 09/14/2016   GLUCOSE 141 (H) 09/14/2016   CHOL 129 05/24/2016   TRIG 76 05/24/2016   HDL 65 05/24/2016   LDLCALC 49 05/24/2016   ALT 11 09/14/2016   AST 15 09/14/2016   NA 139 09/14/2016   K 4.0 09/14/2016   CL 94 (L) 09/14/2016   CREATININE 0.60 09/14/2016   BUN 20 09/14/2016   CO2 35 (H) 09/14/2016   TSH 0.87 09/14/2016   INR 1.01 11/08/2011   HGBA1C 6.9 (H) 07/31/2016    BNP (last 3 results)  Recent Labs  04/23/16 1716 07/26/16 1222  BNP 72.4 163.1*    ProBNP (last 3 results)  Recent Labs  09/14/16 1237  PROBNP 70.0     Other Studies Reviewed  Today:   Assessment/Plan:  1. Chronic Diastolic HF: denies dyspnea. euvolemic on physical exam - she actually looks malnourished. Will need better BP control to prevent exacerbation.  Would favor a more conservative approach at this time. Decrease lasix To 20 mg labs ok   2. HTN: Better on norvasc  Daughter will monitor  at home.   3. PAF:  She is in sinus.  Anticoagulation not recommended due to unsteadiness and falls.   4. Tobacco use: not discussed today - she does continue to smoke.   5. Probable dementia  6. HLD - on statin -  Lab Results  Component Value Date   LDLCALC 49 05/24/2016     7. Malnourished - boost supplements f/u primary      Current medicines are reviewed with the patient today.  The patient does not have concerns regarding medicines other than what has been noted above.  The following changes have been made:  See above.  Labs/ tests ordered today include:    No orders of the defined types were placed in this encounter.    Disposition:   FU with me in 6 months    Patient is agreeable to this plan and will call if any problems develop in the interim.   [name], MD

## 2016-09-21 DIAGNOSIS — L89210 Pressure ulcer of right hip, unstageable: Secondary | ICD-10-CM | POA: Diagnosis not present

## 2016-09-21 DIAGNOSIS — E11622 Type 2 diabetes mellitus with other skin ulcer: Secondary | ICD-10-CM | POA: Diagnosis not present

## 2016-09-21 DIAGNOSIS — L89219 Pressure ulcer of right hip, unspecified stage: Secondary | ICD-10-CM | POA: Diagnosis not present

## 2016-09-21 DIAGNOSIS — I110 Hypertensive heart disease with heart failure: Secondary | ICD-10-CM | POA: Diagnosis not present

## 2016-09-21 DIAGNOSIS — J449 Chronic obstructive pulmonary disease, unspecified: Secondary | ICD-10-CM | POA: Diagnosis not present

## 2016-09-21 DIAGNOSIS — E46 Unspecified protein-calorie malnutrition: Secondary | ICD-10-CM | POA: Diagnosis not present

## 2016-09-21 DIAGNOSIS — L89150 Pressure ulcer of sacral region, unstageable: Secondary | ICD-10-CM | POA: Diagnosis not present

## 2016-09-21 DIAGNOSIS — L89159 Pressure ulcer of sacral region, unspecified stage: Secondary | ICD-10-CM | POA: Diagnosis not present

## 2016-09-22 ENCOUNTER — Ambulatory Visit (INDEPENDENT_AMBULATORY_CARE_PROVIDER_SITE_OTHER): Payer: Medicare Other | Admitting: Cardiovascular Disease

## 2016-09-22 ENCOUNTER — Other Ambulatory Visit: Payer: Self-pay | Admitting: *Deleted

## 2016-09-22 ENCOUNTER — Encounter: Payer: Self-pay | Admitting: *Deleted

## 2016-09-22 ENCOUNTER — Encounter: Payer: Self-pay | Admitting: Cardiovascular Disease

## 2016-09-22 VITALS — BP 110/48 | HR 76 | Ht 62.0 in | Wt 82.4 lb

## 2016-09-22 DIAGNOSIS — I2589 Other forms of chronic ischemic heart disease: Secondary | ICD-10-CM

## 2016-09-22 DIAGNOSIS — E119 Type 2 diabetes mellitus without complications: Secondary | ICD-10-CM | POA: Diagnosis not present

## 2016-09-22 DIAGNOSIS — I255 Ischemic cardiomyopathy: Secondary | ICD-10-CM | POA: Diagnosis not present

## 2016-09-22 DIAGNOSIS — I5032 Chronic diastolic (congestive) heart failure: Secondary | ICD-10-CM

## 2016-09-22 DIAGNOSIS — L89150 Pressure ulcer of sacral region, unstageable: Secondary | ICD-10-CM | POA: Diagnosis not present

## 2016-09-22 DIAGNOSIS — L89312 Pressure ulcer of right buttock, stage 2: Secondary | ICD-10-CM | POA: Diagnosis not present

## 2016-09-22 DIAGNOSIS — L89310 Pressure ulcer of right buttock, unstageable: Secondary | ICD-10-CM | POA: Diagnosis not present

## 2016-09-22 DIAGNOSIS — J441 Chronic obstructive pulmonary disease with (acute) exacerbation: Secondary | ICD-10-CM | POA: Diagnosis not present

## 2016-09-22 DIAGNOSIS — M6281 Muscle weakness (generalized): Secondary | ICD-10-CM | POA: Diagnosis not present

## 2016-09-22 MED ORDER — FUROSEMIDE 20 MG PO TABS: 20.0000 mg | ORAL_TABLET | Freq: Every day | ORAL | 3 refills | Status: DC

## 2016-09-22 NOTE — Patient Instructions (Addendum)
Medication Instructions:  Your physician has recommended you make the following change in your medication:  1-DECREASE Lasix 20 mg by mouth daily.  Labwork: NONE  Testing/Procedures: NONE  Follow-Up: Your physician wants you to follow-up in: 6 months with PA or NP. You will receive a reminder letter in the mail two months in advance. If you don't receive a letter, please call our office to schedule the follow-up appointment.  Your physician wants you to follow-up in: 1 year with Dr. [name]. You will receive a reminder letter in the mail two months in advance. If you don't receive a letter, please call our office to schedule the follow-up appointment.    If you need a refill on your cardiac medications before your next appointment, please call your pharmacy.

## 2016-09-22 NOTE — Patient Outreach (Signed)
[facility] Customer service manager [facility]) Care Management [facility] Community CM Telephone Outreach, Transition of Care day 31  09/22/2016  [name] 21-Dec-1929 [phone]   Successful telephone outreach to [name], daughter/ caregiver, on [facility] CM written consent, of [name], 83 y.o. female referred to [facility] Community CM for transition of care afterrecent hospitalization April 3- 10, 2018 for acute-on-chronic respiratory failure with hypoxia and COPD exacerbation. Patient was discharged to SNF for rehabilitation and was subsequently discharged from [facility] on August 22, 2016. Patient has history including, but not limited to COPD, chronic respiratory failure on home O2 at 2-3 L/min, HTN, HLD, DM on insulin, dCHF, and malnutrition. HIPAA/ identity verified with patient's daughter/ primary caregiver [name].   Today, patient's caregiver reports that "things are going really well," and "we are just leaving the cardiologist office."  Further reports:  -- patient has no pain/ discomfort  -- has all prescribed medicationsand takes as instructed/ prescribed under direction/ management of caregiver/ daughter, using weekly pill box. Patient/ caregiver deny questions around medications.  Daughter reports that lasix dosing was decreased during cardiologist office visit today.  Reports patient no longer using nebulizer after recent pulmonology provider appointment.  -- has attended all provider appointments post- SNF discharge; attended wound clinic "yesterday" for sores on patient's sacral and lower buttock area; reports "wound is almost completely healed."  Attended pulmonology provider appointment last week, cardiology appointment this morning, "everyone has given her a good report."  All previous and upcoming provider appointments reviewed with caregiver today. Daughter/ caregiver continues to transport patient to all provider appointments.  -- home health [facility]) services: reports HH sessions  going "well," caregiver continues to report that she changes patient's wound dressing on days that [facility] nurse does not come, and states "wound is getting much better, it's getting smaller every week."   Reports today that new hospital bed/ mattress that was delivered to patient on Sep 05, 2016 "is more comfortable," as daughter added memory foam mattress topper to bed.   -- Falls/ Safety/ Mobility: Patient has had several falls/ near-falls without injury at home post- SNF discharge; reported no new falls today. Patient continues using walker "all the time."  General fall risks/ prevention education provided/ discussed with patient and caregiver today.    -- self-health management of COPD: Patient currently continuing wearing O2 at "all times 2-3 L/min;" patient/ caregiver denies breathing difficulty outside of patient's baseline; using daily maintenance inhalers, has not needed nebulizer/ rescue inhalers recently.  Reiterated educational material previously provided to patient and caregiver for COPD zones/ corresponding action plans; caregiver reports good general understanding of same and denies questions/ concerns.  States weight has increased to "82 pounds" this morning at cardiology visit, but adds "they told us it wasn't fluid overload; she needs to gain some weight."  Continues regular monitoring of weights.  -- Caregiver fatigue: Careiver reports she is "hanging in there, and again denies need for additional resources, stating, "I'm doing okay, because she is making progress."  Patient and caregiver both deny further issues, concerns, or problems today. I confirmed that patient and caregiver havemy direct phone number, the main [facility] CM office phone number, and the [facility] CM 24-hour nurse advice phone number should issues arise prior to next scheduled [facility] Community CM outreach telephone callin 2 weeks.   Plan:  Patient will take medications as prescribed and will attend all provider  appointments post- SNF discharge.  Patient/ caregiver will promptly notify providers of new concerns, issues, or problems.  Patient will continue working with home health services as ordered post- SNF discharge.  [facility] Community CM outreach to continue with scheduled telephone call in 2 weeks.   Caryl Pina, RN, BSN, [facility] [facility] Care Management  [phone]

## 2016-09-23 DIAGNOSIS — E119 Type 2 diabetes mellitus without complications: Secondary | ICD-10-CM | POA: Diagnosis not present

## 2016-09-23 DIAGNOSIS — L89150 Pressure ulcer of sacral region, unstageable: Secondary | ICD-10-CM | POA: Diagnosis not present

## 2016-09-23 DIAGNOSIS — M6281 Muscle weakness (generalized): Secondary | ICD-10-CM | POA: Diagnosis not present

## 2016-09-23 DIAGNOSIS — L89312 Pressure ulcer of right buttock, stage 2: Secondary | ICD-10-CM | POA: Diagnosis not present

## 2016-09-23 DIAGNOSIS — J441 Chronic obstructive pulmonary disease with (acute) exacerbation: Secondary | ICD-10-CM | POA: Diagnosis not present

## 2016-09-23 DIAGNOSIS — L89310 Pressure ulcer of right buttock, unstageable: Secondary | ICD-10-CM | POA: Diagnosis not present

## 2016-09-27 DIAGNOSIS — M6281 Muscle weakness (generalized): Secondary | ICD-10-CM | POA: Diagnosis not present

## 2016-09-27 DIAGNOSIS — E119 Type 2 diabetes mellitus without complications: Secondary | ICD-10-CM | POA: Diagnosis not present

## 2016-09-27 DIAGNOSIS — L89312 Pressure ulcer of right buttock, stage 2: Secondary | ICD-10-CM | POA: Diagnosis not present

## 2016-09-27 DIAGNOSIS — J441 Chronic obstructive pulmonary disease with (acute) exacerbation: Secondary | ICD-10-CM | POA: Diagnosis not present

## 2016-09-27 DIAGNOSIS — L89310 Pressure ulcer of right buttock, unstageable: Secondary | ICD-10-CM | POA: Diagnosis not present

## 2016-09-27 DIAGNOSIS — L89150 Pressure ulcer of sacral region, unstageable: Secondary | ICD-10-CM | POA: Diagnosis not present

## 2016-09-28 ENCOUNTER — Other Ambulatory Visit: Payer: Self-pay | Admitting: Adult Health

## 2016-09-28 ENCOUNTER — Encounter (HOSPITAL_BASED_OUTPATIENT_CLINIC_OR_DEPARTMENT_OTHER): Payer: Medicare Other | Attending: Surgery

## 2016-09-28 DIAGNOSIS — I110 Hypertensive heart disease with heart failure: Secondary | ICD-10-CM | POA: Insufficient documentation

## 2016-09-28 DIAGNOSIS — L89312 Pressure ulcer of right buttock, stage 2: Secondary | ICD-10-CM | POA: Diagnosis not present

## 2016-09-28 DIAGNOSIS — J449 Chronic obstructive pulmonary disease, unspecified: Secondary | ICD-10-CM | POA: Insufficient documentation

## 2016-09-28 DIAGNOSIS — J441 Chronic obstructive pulmonary disease with (acute) exacerbation: Secondary | ICD-10-CM | POA: Diagnosis not present

## 2016-09-28 DIAGNOSIS — L89219 Pressure ulcer of right hip, unspecified stage: Secondary | ICD-10-CM | POA: Diagnosis not present

## 2016-09-28 DIAGNOSIS — I509 Heart failure, unspecified: Secondary | ICD-10-CM | POA: Insufficient documentation

## 2016-09-28 DIAGNOSIS — L89150 Pressure ulcer of sacral region, unstageable: Secondary | ICD-10-CM | POA: Insufficient documentation

## 2016-09-28 DIAGNOSIS — E46 Unspecified protein-calorie malnutrition: Secondary | ICD-10-CM | POA: Diagnosis not present

## 2016-09-28 DIAGNOSIS — L89310 Pressure ulcer of right buttock, unstageable: Secondary | ICD-10-CM | POA: Diagnosis not present

## 2016-09-28 DIAGNOSIS — E119 Type 2 diabetes mellitus without complications: Secondary | ICD-10-CM | POA: Insufficient documentation

## 2016-09-28 DIAGNOSIS — I2510 Atherosclerotic heart disease of native coronary artery without angina pectoris: Secondary | ICD-10-CM | POA: Insufficient documentation

## 2016-09-28 DIAGNOSIS — M6281 Muscle weakness (generalized): Secondary | ICD-10-CM | POA: Diagnosis not present

## 2016-09-28 DIAGNOSIS — L89159 Pressure ulcer of sacral region, unspecified stage: Secondary | ICD-10-CM | POA: Diagnosis not present

## 2016-09-29 DIAGNOSIS — L89310 Pressure ulcer of right buttock, unstageable: Secondary | ICD-10-CM | POA: Diagnosis not present

## 2016-09-29 DIAGNOSIS — M6281 Muscle weakness (generalized): Secondary | ICD-10-CM | POA: Diagnosis not present

## 2016-09-29 DIAGNOSIS — J441 Chronic obstructive pulmonary disease with (acute) exacerbation: Secondary | ICD-10-CM | POA: Diagnosis not present

## 2016-09-29 DIAGNOSIS — L89312 Pressure ulcer of right buttock, stage 2: Secondary | ICD-10-CM | POA: Diagnosis not present

## 2016-09-29 DIAGNOSIS — E119 Type 2 diabetes mellitus without complications: Secondary | ICD-10-CM | POA: Diagnosis not present

## 2016-09-29 DIAGNOSIS — L89150 Pressure ulcer of sacral region, unstageable: Secondary | ICD-10-CM | POA: Diagnosis not present

## 2016-09-30 DIAGNOSIS — L89312 Pressure ulcer of right buttock, stage 2: Secondary | ICD-10-CM | POA: Diagnosis not present

## 2016-09-30 DIAGNOSIS — M6281 Muscle weakness (generalized): Secondary | ICD-10-CM | POA: Diagnosis not present

## 2016-09-30 DIAGNOSIS — J441 Chronic obstructive pulmonary disease with (acute) exacerbation: Secondary | ICD-10-CM | POA: Diagnosis not present

## 2016-09-30 DIAGNOSIS — L89310 Pressure ulcer of right buttock, unstageable: Secondary | ICD-10-CM | POA: Diagnosis not present

## 2016-09-30 DIAGNOSIS — E119 Type 2 diabetes mellitus without complications: Secondary | ICD-10-CM | POA: Diagnosis not present

## 2016-09-30 DIAGNOSIS — L89150 Pressure ulcer of sacral region, unstageable: Secondary | ICD-10-CM | POA: Diagnosis not present

## 2016-10-05 DIAGNOSIS — L89159 Pressure ulcer of sacral region, unspecified stage: Secondary | ICD-10-CM | POA: Diagnosis not present

## 2016-10-05 DIAGNOSIS — I110 Hypertensive heart disease with heart failure: Secondary | ICD-10-CM | POA: Diagnosis not present

## 2016-10-05 DIAGNOSIS — E119 Type 2 diabetes mellitus without complications: Secondary | ICD-10-CM | POA: Diagnosis not present

## 2016-10-05 DIAGNOSIS — L89219 Pressure ulcer of right hip, unspecified stage: Secondary | ICD-10-CM | POA: Diagnosis not present

## 2016-10-05 DIAGNOSIS — J449 Chronic obstructive pulmonary disease, unspecified: Secondary | ICD-10-CM | POA: Diagnosis not present

## 2016-10-05 DIAGNOSIS — I509 Heart failure, unspecified: Secondary | ICD-10-CM | POA: Diagnosis not present

## 2016-10-05 DIAGNOSIS — L89150 Pressure ulcer of sacral region, unstageable: Secondary | ICD-10-CM | POA: Diagnosis not present

## 2016-10-05 DIAGNOSIS — E46 Unspecified protein-calorie malnutrition: Secondary | ICD-10-CM | POA: Diagnosis not present

## 2016-10-05 DIAGNOSIS — L89312 Pressure ulcer of right buttock, stage 2: Secondary | ICD-10-CM | POA: Diagnosis not present

## 2016-10-05 DIAGNOSIS — M6281 Muscle weakness (generalized): Secondary | ICD-10-CM | POA: Diagnosis not present

## 2016-10-05 DIAGNOSIS — L89310 Pressure ulcer of right buttock, unstageable: Secondary | ICD-10-CM | POA: Diagnosis not present

## 2016-10-05 DIAGNOSIS — J441 Chronic obstructive pulmonary disease with (acute) exacerbation: Secondary | ICD-10-CM | POA: Diagnosis not present

## 2016-10-06 ENCOUNTER — Encounter: Payer: Self-pay | Admitting: *Deleted

## 2016-10-06 ENCOUNTER — Other Ambulatory Visit: Payer: Self-pay | Admitting: *Deleted

## 2016-10-06 DIAGNOSIS — E119 Type 2 diabetes mellitus without complications: Secondary | ICD-10-CM | POA: Diagnosis not present

## 2016-10-06 DIAGNOSIS — M6281 Muscle weakness (generalized): Secondary | ICD-10-CM | POA: Diagnosis not present

## 2016-10-06 DIAGNOSIS — J441 Chronic obstructive pulmonary disease with (acute) exacerbation: Secondary | ICD-10-CM | POA: Diagnosis not present

## 2016-10-06 DIAGNOSIS — L89150 Pressure ulcer of sacral region, unstageable: Secondary | ICD-10-CM | POA: Diagnosis not present

## 2016-10-06 DIAGNOSIS — L89312 Pressure ulcer of right buttock, stage 2: Secondary | ICD-10-CM | POA: Diagnosis not present

## 2016-10-06 DIAGNOSIS — L89310 Pressure ulcer of right buttock, unstageable: Secondary | ICD-10-CM | POA: Diagnosis not present

## 2016-10-06 NOTE — Patient Outreach (Signed)
[facility] Customer service manager [facility]) Care Management [facility] Community CM Telephone Outreach  10/06/2016  [name] [phone]  Successful telephone outreach to [name], daughter/ caregiver, on [facility] CM written consent, of [name], 83 y.o.femalereferred to [facility] Community CM for transition of care afterrecent hospitalization April 3- 10, 2018 for acute-on-chronic respiratory failure with hypoxia and COPD exacerbation. Patient was discharged to SNF for rehabilitation and was subsequently discharged from [facility] on August 22, 2016. Patient has history including, but not limited to COPD, chronic respiratory failure on home O2 at 2-3 L/min, HTN, HLD, DM on insulin, dCHF, and malnutrition. HIPAA/ identity verified with patient's daughter/ primary caregiver [name].   Today, patient's caregiver reports that "things are going great." [name] reports that patient "hasn't had any problems and is feeling good."  Further reports:  -- patient has no pain/ discomfort  -- has all prescribed medicationsand continues taking as instructed/ prescribed under direction/ management of caregiver/ daughter, using weekly pill box. Patient/ caregiver denyquestions around medications.   -- has attended all provider appointments post- SNF discharge; attended last wound clinic visit "this week" for sores on patient's sacral and lower buttock area; reports "wound is healing so well they released her."    -- home health [facility]) services: reports HH sessions going "well," caregiver continues to report that she changes patient's wound dressing on days that [facility] nurse does not come, and states "wound is getting much better, it's getting smaller every week." reports HH RN "visited today," and will continue coming "for about 2 more weeks."  -- Falls/ Safety/ Mobility: no new falls reported today. Patient continues using walker "all the time." General fall risks/ prevention education reinforced with  caregiver today.   -- self-health management of COPD: Patient currently continuing wearing O2 at "all times 2-3 L/min;" patient/ caregiver denies breathing difficulty outside of patient's baseline; using daily maintenance inhalers, has not needed rescue inhalers recently.  Reiterated educational material previously provided to patient and caregiver for COPD zones/ corresponding action plans; caregiver reports good general understanding of same and denies questions/ concerns. States weight has continued increasing and reports last weight of  "82.4 pounds."  Continues regular monitoring of weights.  Reports patient "has stayed in the green zone."  -- Caregiver fatigue: Careiver reports things "are better," and again denies need for additional resources, stating, "I'm really doing okay, because she is making progress and getting better, so it's not as hard as it was."  [name] reports that she will be travelling to [place] on October 21, 2016, as her daughter is having a baby; plans are for patient to stay with her sons while [name] travels to [place].    [name] deniesfurther issues, concerns, or problems today. I confirmed that patient andcaregiver havemy direct phone number, the main [facility] CM office phone number, and the [facility] CM 24-hour nurse advice phone number should issues arise prior to next scheduled [facility] Community CM outreach telephone callin 2 weeks.   Plan:  Patient will take medications as prescribed and will attend all provider appointments post- SNF discharge.  Patient/ caregiver will promptly notify providers of new concerns, issues, or problems.  Patient will continue working with home health services as ordered post- SNF discharge.  [facility] Community CM outreach to continue with scheduled telephone callin 2 weeks.  Caryl Pina, RN, BSN, [facility] [facility] Care Management  [phone]

## 2016-10-07 DIAGNOSIS — E119 Type 2 diabetes mellitus without complications: Secondary | ICD-10-CM | POA: Diagnosis not present

## 2016-10-07 DIAGNOSIS — L89310 Pressure ulcer of right buttock, unstageable: Secondary | ICD-10-CM | POA: Diagnosis not present

## 2016-10-07 DIAGNOSIS — M6281 Muscle weakness (generalized): Secondary | ICD-10-CM | POA: Diagnosis not present

## 2016-10-07 DIAGNOSIS — J441 Chronic obstructive pulmonary disease with (acute) exacerbation: Secondary | ICD-10-CM | POA: Diagnosis not present

## 2016-10-07 DIAGNOSIS — L89150 Pressure ulcer of sacral region, unstageable: Secondary | ICD-10-CM | POA: Diagnosis not present

## 2016-10-07 DIAGNOSIS — L89312 Pressure ulcer of right buttock, stage 2: Secondary | ICD-10-CM | POA: Diagnosis not present

## 2016-10-11 ENCOUNTER — Other Ambulatory Visit: Payer: Self-pay | Admitting: Pulmonary Disease

## 2016-10-11 DIAGNOSIS — L89312 Pressure ulcer of right buttock, stage 2: Secondary | ICD-10-CM | POA: Diagnosis not present

## 2016-10-11 DIAGNOSIS — E119 Type 2 diabetes mellitus without complications: Secondary | ICD-10-CM | POA: Diagnosis not present

## 2016-10-11 DIAGNOSIS — M6281 Muscle weakness (generalized): Secondary | ICD-10-CM | POA: Diagnosis not present

## 2016-10-11 DIAGNOSIS — J441 Chronic obstructive pulmonary disease with (acute) exacerbation: Secondary | ICD-10-CM | POA: Diagnosis not present

## 2016-10-11 DIAGNOSIS — L89310 Pressure ulcer of right buttock, unstageable: Secondary | ICD-10-CM | POA: Diagnosis not present

## 2016-10-11 DIAGNOSIS — L89150 Pressure ulcer of sacral region, unstageable: Secondary | ICD-10-CM | POA: Diagnosis not present

## 2016-10-14 ENCOUNTER — Telehealth: Payer: Self-pay | Admitting: Pulmonary Disease

## 2016-10-14 MED ORDER — UMECLIDINIUM BROMIDE 62.5 MCG/INH IN AEPB: 1.0000 | INHALATION_SPRAY | Freq: Every day | RESPIRATORY_TRACT | 11 refills | Status: DC

## 2016-10-14 MED ORDER — BUDESONIDE-FORMOTEROL FUMARATE 160-4.5 MCG/ACT IN AERO: INHALATION_SPRAY | RESPIRATORY_TRACT | 11 refills | Status: DC

## 2016-10-14 NOTE — Telephone Encounter (Signed)
Spoke with pt's daughter, [name]. Pt is needing refills on Symbicort and Incruse. Rxs have been sent in. Nothing further was needed.

## 2016-10-18 ENCOUNTER — Other Ambulatory Visit: Payer: Self-pay | Admitting: Adult Health

## 2016-10-18 DIAGNOSIS — M6281 Muscle weakness (generalized): Secondary | ICD-10-CM | POA: Diagnosis not present

## 2016-10-18 DIAGNOSIS — L89150 Pressure ulcer of sacral region, unstageable: Secondary | ICD-10-CM | POA: Diagnosis not present

## 2016-10-18 DIAGNOSIS — J441 Chronic obstructive pulmonary disease with (acute) exacerbation: Secondary | ICD-10-CM | POA: Diagnosis not present

## 2016-10-18 DIAGNOSIS — L89310 Pressure ulcer of right buttock, unstageable: Secondary | ICD-10-CM | POA: Diagnosis not present

## 2016-10-18 DIAGNOSIS — L89312 Pressure ulcer of right buttock, stage 2: Secondary | ICD-10-CM | POA: Diagnosis not present

## 2016-10-18 DIAGNOSIS — E119 Type 2 diabetes mellitus without complications: Secondary | ICD-10-CM | POA: Diagnosis not present

## 2016-10-19 ENCOUNTER — Other Ambulatory Visit: Payer: Self-pay | Admitting: Adult Health

## 2016-10-20 ENCOUNTER — Other Ambulatory Visit: Payer: Self-pay | Admitting: *Deleted

## 2016-10-20 ENCOUNTER — Encounter: Payer: Self-pay | Admitting: *Deleted

## 2016-10-20 NOTE — Patient Outreach (Addendum)
[facility]) Care Management [place] Telephone Outreach  10/20/2016  [name] 07/23/1929 [id number]  Successful telephone outreach to [name], daughter/ caregiver, on [facility] CM written consent, of [name],83 y.o.femalereferred to [place] for transition of care afterrecent hospitalization April 3- 10, 2018 for acute-on-chronic respiratory failure with hypoxia and COPD exacerbation. Patient was discharged to SNF for rehabilitation and was subsequently discharged from [facility] on August 22, 2016. Patient has history including, but not limited to COPD, chronic respiratory failure on home O2 at 2-3 L/min, HTN, HLD, DM on insulin, dCHF, and malnutrition. HIPAA/ identity verified with patient's daughter/ primary caregiver [name].  Today, patient's caregiveragain reports that "things are going great." [name] reports that patient "hasn't had any problems and is feeling good."  Further reports:  -- patient has no pain/ discomfort  -- has all prescribed medicationsand continues taking as instructed/ prescribed under direction/ management of caregiver/ daughter, using weekly pill box. Patient/ caregiver denyquestions around medications.  -- has attended all provider appointments post- SNF discharge  -- home health [facility]) services:reports HH sessions now completed as patient's wound "has completely healed."  -- Falls/ Safety/ Mobility: no new falls reported today. Patient continues using walker "all the time." General fall risks/ prevention education reinforced with caregiver today.   -- self-health management of COPD: Patient currently continuing wearing O2 at "all times 2-3 L/min;" patient/ caregiver denies breathing difficulty outside of patient's baseline; using daily maintenance inhalers, has not needed rescue inhalers. Reiteratededucational material previously provided to patient and caregiver for COPD zones/  corresponding action plans; caregiver reports good general understanding of same and denies questions/ concerns. States weight has continued increasing and reports last weight of  "84 pounds."  Continues regular monitoring of weights.  Reports patient "has stayed in the green zone."  -- Caregiver fatigue: Careiver reports things "are better," and again denies need for additional resources.  [name] reports that she will be travelling to [place] on October 21, 2016, as her daughter is having a baby; plans are for patient to stay with her sons while [name] travels to [place].    [name] deniesfurther issues, concerns, or problems today. We discussed that patient has thus far met all of her previously established [facility] CCM goals, and [name] agrees that patient is ready for [facility] CCM case closure.  I confirmed that [name] has my direct phone number, the main [facility] CM office phone number, and the [facility] CM 24-hour nurse advice phone number should issues arise in the future.   Plan:  Will close [place] CM case, as patient has successfully met her previously established goals, and make patient's PCP aware of same.  Oneta Rack, RN, BSN, [facility] Care Management  [phone]

## 2016-11-07 DIAGNOSIS — I482 Chronic atrial fibrillation: Secondary | ICD-10-CM | POA: Diagnosis not present

## 2016-11-07 DIAGNOSIS — R748 Abnormal levels of other serum enzymes: Secondary | ICD-10-CM | POA: Diagnosis not present

## 2016-11-07 DIAGNOSIS — R42 Dizziness and giddiness: Secondary | ICD-10-CM | POA: Diagnosis not present

## 2016-11-07 DIAGNOSIS — Z79899 Other long term (current) drug therapy: Secondary | ICD-10-CM | POA: Diagnosis not present

## 2016-11-07 DIAGNOSIS — R350 Frequency of micturition: Secondary | ICD-10-CM | POA: Diagnosis not present

## 2016-11-07 DIAGNOSIS — D649 Anemia, unspecified: Secondary | ICD-10-CM | POA: Diagnosis not present

## 2016-11-21 ENCOUNTER — Encounter: Payer: Self-pay | Admitting: Pulmonary Disease

## 2016-11-21 ENCOUNTER — Ambulatory Visit (INDEPENDENT_AMBULATORY_CARE_PROVIDER_SITE_OTHER): Payer: Medicare Other | Admitting: Pulmonary Disease

## 2016-11-21 VITALS — BP 116/72 | HR 67 | Temp 97.5°F | Ht 62.0 in | Wt 88.5 lb

## 2016-11-21 DIAGNOSIS — I5032 Chronic diastolic (congestive) heart failure: Secondary | ICD-10-CM | POA: Diagnosis not present

## 2016-11-21 DIAGNOSIS — J9621 Acute and chronic respiratory failure with hypoxia: Secondary | ICD-10-CM | POA: Diagnosis not present

## 2016-11-21 DIAGNOSIS — J432 Centrilobular emphysema: Secondary | ICD-10-CM

## 2016-11-21 DIAGNOSIS — I255 Ischemic cardiomyopathy: Secondary | ICD-10-CM

## 2016-11-21 DIAGNOSIS — J449 Chronic obstructive pulmonary disease, unspecified: Secondary | ICD-10-CM

## 2016-11-21 DIAGNOSIS — I10 Essential (primary) hypertension: Secondary | ICD-10-CM | POA: Diagnosis not present

## 2016-11-21 NOTE — Progress Notes (Signed)
Subjective:     Patient ID: [name], female   DOB: [birth year], 83 y.o.   MRN: [id number]  HPI 83 y/o BF w/ severe COPD/emphysema, multifactorial dyspnea & finally quit smoking after [facility] 07/2016 w/ 65+pack-yr hx, pulmHTN, diastolicCHF, PAF, underweight & FTT; referred by PCP- DrKoirala for pulm eval...   ~  May 27, 2015:  Initial pulmonary consult w/ SN>      66 y/o BF referred by DrCrossley for a pulmonary evaluation due to dyspnea> He saw her yest in his office for f/u sinus drainage & cough, treated w/ ZPak/ then Doxy, he did a CXR showing Emphysema & referred her here;  Her PCP is Dr. Maudie Mercury Shelton/ Dr. Glendale Chard ([facility]);  Pt states that she "got a cold" about 3-4 wks ago & that's when her symptoms started- rattling cough, grey sput, no hemoptysis, incr SOB w/ activity esp rushing/ stairs/ etc but she says ADLs are OK & most stenuous activ is mailbox & back (not exercising);  She is a current everyday smoker> started in early teens, smoked for 70 yrs up to 1ppd max, and cut back to 3 cig/d for the last several months (~65+pack-yr smoking hx);  She states that sh'e in the process of quitting- "I just take a few puffs, I have the discipline";  She denies hx prior lung problems> rare episode of bronchitis requiring antibiotics, no prev pneumonia, no hx TB or known exposure, no prev Hosp for resp illness;  No known hx of asbestos exposure or other toxins;  She has hx sinusitis & prev sinus surg at [facility] in the 80s;  She has signif medical issues including> Underwt w/ BMI=15, HBP, CAD, CHF, HL, DM, etc;  FamHx is neg for resp problems (see below)...    Current Meds>  AlbutHFA prn, Flonase/ Claritin, ASA81, Lanoxin0.125, Coreg3.125Bid, Amlod5, Lotensin40, Lasix20, Lip10, Metform500, Glimep2, Vits...  EXAM shows Afeb, VSS, O2sat=95% on RA at rest;  83#, 5'3"Tall, BMI<15;  HEENT- neg, mallamapti1;  Chest- congested cough, scat rhonchi, no w/r/consolidation;  Heart- RR w/o m/r/g;  Abd-  thin, soft, neg;  Ext- w/o c/c/e; Neuro- nonfocal...  2DEcho 05/01/2010 showed norm LVF w/ EF=60-65% & no regional wall motion abn, Gr1DD, mild MR, +atrial septal aneurysm, incr PAsys=8mHg  CT Angio Chest 09/04/14 in EPIC showed NEG for PE...   [facility] norm heart [facility], COPD/ hyperinflated/ flattened diaph/ NAD, degen changes in Tspine...  Spirometry 05/27/15 showed FVC=1.38 (66%), FEV1=0.66 (44%), %1sec=48, mid-flows reduced at 23% predicted;  This is c/w severe airflow obstruction & GOLD Stage3 COPD...  Ambulatory Oximetry 05/27/15> O2sat=100% on RA at rest w/ pulse=78/min; she ambulated 2 Laps and stopped due to fatigue, lowest O2sat=98% w/ pulse=114/min... IMP>>    Dyspnea is multifactorial-- due to COPD/emphysema, smoking, heart dis, deconditioning/ sedentary lifestyle/ 83y/o...    COPD/ emphysema    Cigarette smoker, still smoking 3cig/d, 65+pack-yr smoking hx    PulmHTN w/ 2DEcho in 2012 showing PAsys est~410mg    CARDIAC Hx>  followed by DrCherly Hensenor HBP, diastolicCHF, hx AFib    MEDICAL Hx>  follwed by PCP for HBP, HL, DM, FTT/ underwt, etc... PLAN>>    We reviewed her objective data and the Dx of COPD/emphysema (GOLD Stage3); she understands that she must quit all smoking, and rec to start regular inhaler use-- SYMBICORT160-2spBid, INCRUSE-once daily, +OTC MUCINEX60020m-2Bid, +nutritional supplements daily;  She needs regular f/u w/ her PCP to work on med compliance, nutritional support, etc... ROV  recheck in 6wks.  ~  July 21, 2015:  30moROV w/ SN>  [name] that her SOB is improved on the Symbicort160-2spBid, Incruse once daily, ProventilHFA as needed ("my breathing is much better" she says); she is not using the Mucinex and unfortunately still smoking a few cigs per day; she still has a mild cough w/ some thick beige sput production; she denies CP/ tightness/ f/c/s... We reviewed her prev eval/ CXR/ spirometry/ etc...  EXAM shows Afeb, VSS, O2sat=94% on RA at  rest;  84#, 5'3"Tall, BMI<15;  HEENT- neg, mallamapti2;  Chest- congested cough, decrBS, scat rhonchi, no w/r/consolidation;  Heart- RR w/o m/r/g;  Abd- thin, soft, neg;  Ext- w/o c/c/e...  IMP/PLAN>>  She is again advised to quit all smoking! We reviewed med regimen w/ Symbicort160, Incruse, Mucinex, AlbutHFA rescue inhaler, etc;  Reminded to incr exercise program, walk regularly, eat better and gain some weight...  ~  August 15, 2016:  113moOV w/ SN>  [name] [place]/19/18 w/ sinus congestion & drainage, +SOB, cough, wheezing- still smoking a few; she had the Symbicort but I suspect NOT using regularly, she was using her daughter's Albuterol rescue inhaler;  She desaturated w/ exercise (87% after 1 lap on RA & was started on Home O2, Ceftin, & Pred taper; asked to restart INCRUSE & given a rescue inhaler of her own... She improved on the regimen but continued smoking &  gradually deteriorated after she finished the Pred, stopped the Incruse, & backed off on the Symbicort...  She presented to the ER 07/26/16 w/ worsening SOB found to have a COPD exac, acute on chr hypoxemic resp failure, acute on chr diastolic CHF, along w/ her medical issues of wt loss, DM, malnutrition, and weakness => ADM by [facility] 4/3 - 08/02/16 and [facility] for short term rehab; she was treated w/ antibiotics, IV Solumedrol, BiPAP, NEBs, etc;  Disch on Pred taper (off now), NEBs vs Proair prn, SYMBICORT160-2spBid, not on LAMA rx;  Cardiac meds as below;      Dyspnea is multifactorial-- due to COPD/emphysema, smoking, heart dis, deconditioning/ sedentary lifestyle/ 8327/o => multidisciplinary approach supervised by PCP...    COPD/ emphysema-- on Symbicort160-2spBid, Incruse- once daily, O2 24/7 now & we will check ONO...    Cigarette smoker, still [facility]    PulmHTN w/ 2DEcho in 2012 showing PAsys est~4353m    CARDIAC Hx>  followed by DrNCherly Hensenr HBP, diastolicCHF, hx AFib, etc; on  ASA81, Coreg6.25Bid, Amlod5, Lotensin10, Lasix20-2/d, Lanoxin0.125-1/2 daily    Medical Issues> HBP, HL (Lip10), DM (Metform, Glimep), FTT/ physical deconditioning/ poor nutrition; prev followed by DrKCurly Rimow seeing DrPandy, PieJacksonr note of 08/04/16 is reviewed... EXAM shows Afeb, VSS, O2sat=95% on 2L O2;  76#, 5'3"Tall, BMI<15;  HEENT- neg, mallamapti2, tongue coated;  Chest- congested cough, decrBS, scat rhonchi, no w/r/consolidation;  Heart- RR w/o m/r/g;  Abd- thin, soft, neg;  Ext- w/o c/c/e...   CXR 07/26/16 (independently reviewed by me in the PACS system) showed cardiomeg, mild basilar scarring & lingular atx, scoliosis & DDD in Tspine...  CT Angio Chest 07/26/16>  Norm heart size, thor Ao atherosclerosis, NEG for PE; no adenopathy, +centrilob emphysema but otherw clear lungs, small effusions  2DEcho 07/29/16>  Diffuse HK, abn septal motion, reduced LVF w/ EF=40-45%, mild MR, aortic calcif, RVsys~23...  LABS 07/2016>  Chems- Na~132, HCO3~36, BS~269, A1c=6.9, Cr~0.77;  CBC- Hg~11-12, WBC~15=>12K IMP/PLAN>>  [name] MUST take  all meds as directed (needs admin from Family member care giver) and must commit to NO SMOKING;  We will check ONO in the interim;  Tongue coated- needs attn to oral hygiene, rx Nystatin;  Needs PT/ PT/ visiting nurses, etc... We plan ROV recheck 21mo..  ~  Sep 14, 2016:  178moOV & pulmonary follow up visit> Her PCP is listed as DrKoirala- ?when last seen?  She tells me that she is still NOT SMOKING but "I have urges";  She says her chest & breathing are ok-- denies cough, sput, SOB & CP;  She does have DOE eg w/ stairs at home, which she takes 1 step at a time, sometimes w/ help, & she feels as thopugh she is doing well...  I quizzed her about her meds-- she is NOT using the NEB (ran out of the Duoneb med & never refilled it);  On O2 at 2L/min flow 24/7, Symbicort160-2spBid, Incruse once daily, VentolinHFA prn; Flonase; it is hard to know what  she is really taking & we stressed the importance of no smoking & taking her meds every day as prescribed... we reviewed the ABOVE medical problems as well>>     EXAM shows Afeb, VSS, O2sat=95% on 2L O2;  76#, 5'3"Tall, BMI<15;  HEENT- neg, mallamapti2, tongue coated;  Chest- congested cough, decrBS, scat rhonchi, no w/r/consolidation;  Heart- RR w/o m/r/g;  Abd- thin, soft, neg;  Ext- w/o c/c/e...   LABS 09/14/16>  Chems- ok x HCO3=35, BS=141, (Cr=0.60 & LFTs wnl);  CBC- mild anemia w/ Hg=10.7;  TSH=0.87;  BNP=70...  Overnight oximetry done 08/18/16>  She only recorded for 2H;  O2sat nadir= 74%;  She spent 22 min w/ O2sat<88% IMP/PLAN>>  [name] commit to staying off the cigs;  For now we discussed using the Symbicort/ Incruse everyday & the VentolinHFA prn (she will leave off the NEBs as she has already stopped this on her own);  Must use her O2 24/7 and of course no smoke;  Medical issues per DrKoirala including- HBP, disatolicCHF, HxPAF, HL, DM, FTT/ physical deconditioning/ poor nutrition    Note: >50% of this 3020mrov was spent in counseling 7 coordination of care.   ~  November 21, 2016:  76mo27mo & [name] reports that she has seen her PCP-Dr KoirDorthy Cooler do not have otes, he is not in epic, pt reports he started an Iron tab), and Cards-DrNishan> seen 09/22/16, note reviewed, Hx HBP, PAFib, DiastolicCHF, & underweight at 80lbs w/ BMI~15; he noted ongoing tobacco abuse;  Pt tells me she quit smoking 07/2016 Hosp;  She is holding NSR & not on Coumadin; her last 2DEcho in 2012 showed EF=60-65% and Gr1DD;  On ECASA81, Lotensin10, CoreBrownleeid, Digoxin0.125-1/2tab, Lasix40=> he cut her dose to 20mg70m.  SMarland KitchenMarland Kitchen [facility] conversation w/ care giver (daughRamona MilleSabra Heckicated that "things are going great" and pt "hasn't had any problems, feeling good"; having met her goals- [facility]... WE REVIEWED ABOVE PROB LIST...    EXAM shows Afeb, VSS, O2sat=95% on 2L  O2;  89#, 5'3"Tall, BMI~16;  HEENT- neg, mallamapti2, tongue coated;  Chest- congested cough, decrBS, scat rhonchi, no w/r/consolidation;  Heart- RR w/o m/r/g;  Abd- thin, soft, neg;  Ext- w/o c/c/e...  IMP/PLAN>>  Again [name] to do her NEB Tid followed by the Advair-2spBid & Incruse once daily;  DrNisCherly Hensenanaging her PAF, Systolic & Diastolic CHF, HBP etc;  DrKoirala manages her HBP, DM,  HL, Anemia etc...     Past Medical History:  Diagnosis Date  . Acute bronchitis   . Acute on chronic respiratory failure with hypoxia ([place])   . Benign hypertensive renal disease   . CAD (coronary artery disease)   . Cardiomyopathy    non-ischemic 04/2008 EF 45% cardiac MRI no scar  . CHF (congestive heart failure) ([place])   . Chronic sinusitis   . COPD (chronic obstructive pulmonary disease) ([place])   . COPD exacerbation ([place])   . Depression   . Diabetes mellitus without complication, without long-term current use of insulin ([place])   . Dyslipidemia associated with type 2 diabetes mellitus ([place])   . Dyspnea on exertion    chronic  . Hypercholesterolemia   . Hypertension   . On home oxygen therapy    "3L; 24/7; for the last 2 1/2 weeks; none before that" (07/26/2016)  . Protein-calorie malnutrition, severe ([place])   . Sinus headache    "chronic"  . Stroke [facility]) 1997   "mild"  . Type II diabetes mellitus ([place])     Past Surgical History:  Procedure Laterality Date  . carotid angiogram     2006  . ESOPHAGOGASTRODUODENOSCOPY     with foreign body removal  . NASAL SINUS SURGERY  ~1974;~ 1976  . TONSILLECTOMY     "when I was a chld"    Outpatient Encounter Prescriptions as of 11/21/2016  Medication Sig  . albuterol (VENTOLIN HFA) 108 (90 Base) MCG/ACT inhaler Inhale 2 puffs into the lungs every 6 (six) hours as needed for wheezing or shortness of breath.  Marland Kitchen aspirin EC 81 MG tablet Take 81 mg by mouth daily.  Marland Kitchen atorvastatin (LIPITOR) 10 MG tablet Take 1 tablet (10 mg total) by mouth  daily at 6 PM.  . benazepril (LOTENSIN) 10 MG tablet Take 1 tablet (10 mg total) by mouth daily.  . budesonide-formoterol (SYMBICORT) 160-4.5 MCG/ACT inhaler INHALE 2 PUFFS INTO THE LUNGS TWICE DAILY  . carvedilol (COREG) 6.25 MG tablet Take 1 tablet (6.25 mg total) by mouth 2 (two) times daily with a meal.  . digoxin (DIGOX) 0.125 MG tablet Take 0.5 tablets (0.0625 mg total) by mouth daily.  . ferrous sulfate 325 (65 FE) MG tablet Take 325 mg by mouth daily with breakfast.  . fluticasone (FLONASE) 50 MCG/ACT nasal spray Place 1 spray into both nostrils daily as needed for allergies.  . furosemide (LASIX) 20 MG tablet Take 1 tablet (20 mg total) by mouth daily.  . insulin aspart (NOVOLOG) 100 UNIT/ML injection Inject into the skin daily as needed for high blood sugar (For CBG >300).  . metFORMIN (GLUCOPHAGE-XR) 500 MG 24 hr tablet Take 1,000 mg by mouth 2 (two) times daily.   . Multiple Vitamin (MV-ONE PO) Take 1 tablet by mouth daily.  Marland Kitchen umeclidinium bromide (INCRUSE ELLIPTA) 62.5 MCG/INH AEPB Inhale 1 puff into the lungs daily.  Marland Kitchen UNABLE TO FIND Med Name: Med pass 120 mL by mouth 3 times daily   No facility-administered encounter medications on file as of 11/21/2016.     No Known Allergies   Immunization History  Administered Date(s) Administered  . Influenza,inj,Quad PF,36+ Mos 01/24/2015, 01/24/2016  . Pneumococcal-Unspecified 12/25/2014  NEED TO CHECK w/ DrSanders/ Koirala/ Pandy about her IMMUNIZ HISTORY...   Current Medications, Allergies, Past Medical History, Past Surgical History, Family History, and Social History were reviewed in [employer] record.   Review of Systems  All symptoms NEG except where BOLDED >>  Constitutional:  F/C/S, fatigue, anorexia, unexpected weight change. HEENT:  HA, visual changes, hearing loss, earache, nasal symptoms, sore throat, mouth sores, hoarseness. Resp:  cough, sputum, hemoptysis; SOB, tightness,  wheezing. Cardio:  CP, palpit, DOE, orthopnea, edema. GI:  N/V/D/C, blood in stool; reflux, abd pain, distention, gas. GU:  dysuria, freq, urgency, hematuria, flank pain, voiding difficulty. MS:  joint pain, swelling, tenderness, decr ROM; neck pain, back pain, etc. Neuro:  HA, tremors, seizures, dizziness, syncope, weakness, numbness, gait abn. Skin:  suspicious lesions or skin rash. Heme:  adenopathy, bruising, bleeding. Psyche:  confusion, agitation, sleep disturbance, hallucinations, anxiety, depression suicidal.   Objective:   Physical Exam       Vital Signs:  Reviewed...   General:  WD, underweight, 82 y/o BF in NAD; alert & oriented; pleasant & cooperative... HEENT:  [place]/AT; Conjunctiva- pink, Sclera- nonicteric, EOM-wnl, PERRLA, Fundi-benign; EACs-clear, TMs-wnl; NOSE-sl red; THROAT-clear & wnl.  Neck:  Supple w/ decr ROM; no JVD; normal carotid impulses w/o bruits; no thyromegaly or nodules palpated; no lymphadenopathy.  Chest:  DecrBS, clear without wheezes, rales, or rhonchi heard. Heart:  Regular Rhythm; norm S1 & S2 without murmurs, rubs, or gallops detected. Abdomen:  Soft & nontender- no guarding or rebound; normal bowel sounds; no organomegaly or masses palpated. Ext:  decrROM; without deformities +arthritic changes; no varicose veins, +venous insuffic, no edema;  Pulses intact w/o bruits. Neuro:  No focal neuro deficits Derm:  No lesions noted; no rash etc. Lymph:  No cervical, supraclavicular, axillary, or inguinal adenopathy palpated.   Assessment:      IMP>>    Dyspnea is multifactorial-- due to COPD/emphysema, smoking, heart dis, deconditioning/ sedentary lifestyle/ 83 y/o...    COPD/ emphysema    Cigarette smoker, still smoking 3cig/d, 65+pack-yr smoking hx    PulmHTN w/ 2DEcho in 2012 showing PAsys est~65mHg    CARDIAC Hx>  followed by DCherly Hensenfor HBP, diastolicCHF, hx AFib    MEDICAL Hx>  follwed by PCP for HBP, HL, DM, FTT/ underwt,  etc...  PLAN>> 05/27/15>   We reviewed her objective data and the Dx of COPD/emphysema (GOLD Stage3); she understands that she must quit all smoking, and rec to start regular inhaler use-- SYMBICORT160-2spBid, INCRUSE-once daily, +OTC MUCINEX6040m1-2Bid, +nutritional supplements daily;  She needs regular f/u w/ her PCP to work on med compliance, nutritional support, etc... ROV recheck in 6wks. 07/21/15>   She is again advised to quit all smoking! We reviewed med regimen w/ Symbicort160, Incruse, Mucinex, AlbutHFA rescue inhaler, etc;  Reminded to incr exercise program, walk regularly, eat better and gain some weight... 08/15/16>   [name] MUST take all meds as directed (needs admin from Family member care giver) and must commit to NO SMOKING;  We will check ONO in the interim;  Tongue coated- needs attn to oral hygiene, rx Nystatin;  Needs PT/ PT/ visiting nurses, etc... We plan ROV recheck 50m84mo 09/15/26>   [name] must commit to staying off the cigs;  For now we discussed using the Symbicort/ Incruse everyday & the VentolinHFA prn (she will leave off the NEBs as she has already stopped this on her own);  Must use her O2 24/7 and of course no smoke;  Medical issues per DrKoirala including- HBP, disatolicCHF, HxPAF, HL, DM, FTT/ physical deconditioning/ poor nutrition 11/21/16>   Again [name] reminded to do her NEB Tid followed by the Advair-2spBid & Incruse once daily;  DrNCherly Hensen managing her PAF, Systolic &  Diastolic CHF, HBP etc;  DrKoirala manages her HBP, DM, HL, Anemia etc   Plan:     Patient's Medications  New Prescriptions   No medications on file  Previous Medications   ALBUTEROL (VENTOLIN HFA) 108 (90 BASE) MCG/ACT INHALER    Inhale 2 puffs into the lungs every 6 (six) hours as needed for wheezing or shortness of breath.   ASPIRIN EC 81 MG TABLET    Take 81 mg by mouth daily.   ATORVASTATIN (LIPITOR) 10 MG TABLET    Take 1 tablet (10 mg total) by mouth daily at 6 PM.   BENAZEPRIL  (LOTENSIN) 10 MG TABLET    Take 1 tablet (10 mg total) by mouth daily.   BUDESONIDE-FORMOTEROL (SYMBICORT) 160-4.5 MCG/ACT INHALER    INHALE 2 PUFFS INTO THE LUNGS TWICE DAILY   CARVEDILOL (COREG) 6.25 MG TABLET    Take 1 tablet (6.25 mg total) by mouth 2 (two) times daily with a meal.   DIGOXIN (DIGOX) 0.125 MG TABLET    Take 0.5 tablets (0.0625 mg total) by mouth daily.   FERROUS SULFATE 325 (65 FE) MG TABLET    Take 325 mg by mouth daily with breakfast.   FLUTICASONE (FLONASE) 50 MCG/ACT NASAL SPRAY    Place 1 spray into both nostrils daily as needed for allergies.   FUROSEMIDE (LASIX) 20 MG TABLET    Take 1 tablet (20 mg total) by mouth daily.   INSULIN ASPART (NOVOLOG) 100 UNIT/ML INJECTION    Inject into the skin daily as needed for high blood sugar (For CBG >300).   METFORMIN (GLUCOPHAGE-XR) 500 MG 24 HR TABLET    Take 1,000 mg by mouth 2 (two) times daily.    MULTIPLE VITAMIN (MV-ONE PO)    Take 1 tablet by mouth daily.   UMECLIDINIUM BROMIDE (INCRUSE ELLIPTA) 62.5 MCG/INH AEPB    Inhale 1 puff into the lungs daily.   UNABLE TO FIND    Med Name: Med pass 120 mL by mouth 3 times daily  Modified Medications   No medications on file  Discontinued Medications   No medications on file

## 2016-11-21 NOTE — Patient Instructions (Signed)
Today we updated your med list in our EPIC system...    Continue your current medications the same...  Gradually increase your exercise program to build your strength & stamina...  Call for any questions...  Let's plan a follow up visit in 85mo, sooner if needed for problems.Marland KitchenMarland Kitchen

## 2016-12-14 ENCOUNTER — Telehealth: Payer: Self-pay | Admitting: Cardiovascular Disease

## 2016-12-14 DIAGNOSIS — I10 Essential (primary) hypertension: Secondary | ICD-10-CM | POA: Diagnosis not present

## 2016-12-14 DIAGNOSIS — I482 Chronic atrial fibrillation: Secondary | ICD-10-CM | POA: Diagnosis not present

## 2016-12-14 DIAGNOSIS — J3489 Other specified disorders of nose and nasal sinuses: Secondary | ICD-10-CM | POA: Diagnosis not present

## 2016-12-14 DIAGNOSIS — R42 Dizziness and giddiness: Secondary | ICD-10-CM | POA: Diagnosis not present

## 2016-12-14 NOTE — Telephone Encounter (Signed)
Dizziness not from heart BP ok don't take norvasc

## 2016-12-14 NOTE — Telephone Encounter (Signed)
Called [name], patient's daughter (DPR). Patient has been having episodes of dizziness off and on since June while sitting. [name] stated patient has had three of these episodes this week, and at those times patient's BP and HR were elevated. BP 134/77 HR 115, BP 143/75 HR 102, BP 153/82 HR 117. Patient denies any chest pain or palpitations. Patient's BP 179/91 and HR 76 at this time. Daughter was also wonder if patient should be taking Norvasc. Norvasc was taken off patient's list at PCP office visit back in May for low BP, but patient continued to get refills. Will forward to Dr. [name] for advisement.

## 2016-12-14 NOTE — Telephone Encounter (Signed)
Called patient's daughter back with Dr. Nishan's recommendations. Patient's daughter verbalized understanding  

## 2016-12-14 NOTE — Telephone Encounter (Signed)
New message    Pt c/o BP issue: STAT if pt c/o blurred vision, one-sided weakness or slurred speech  1. What are your last 5 BP readings? 134/77 heart rate at 115, 143/75 heart rate 102, 153/82 heart rate 117  2. Are you having any other symptoms (ex. Dizziness, headache, blurred vision, passed out)? Dizzy spells  3. What is your BP issue? bp running high and so is heart rate

## 2017-01-10 DIAGNOSIS — I482 Chronic atrial fibrillation: Secondary | ICD-10-CM | POA: Diagnosis not present

## 2017-01-10 DIAGNOSIS — R42 Dizziness and giddiness: Secondary | ICD-10-CM | POA: Diagnosis not present

## 2017-01-10 DIAGNOSIS — E43 Unspecified severe protein-calorie malnutrition: Secondary | ICD-10-CM | POA: Diagnosis not present

## 2017-01-10 DIAGNOSIS — I10 Essential (primary) hypertension: Secondary | ICD-10-CM | POA: Diagnosis not present

## 2017-01-10 DIAGNOSIS — E119 Type 2 diabetes mellitus without complications: Secondary | ICD-10-CM | POA: Diagnosis not present

## 2017-01-10 DIAGNOSIS — Z79899 Other long term (current) drug therapy: Secondary | ICD-10-CM | POA: Diagnosis not present

## 2017-01-11 ENCOUNTER — Other Ambulatory Visit: Payer: Self-pay | Admitting: Family Medicine

## 2017-01-11 DIAGNOSIS — R42 Dizziness and giddiness: Secondary | ICD-10-CM

## 2017-01-23 ENCOUNTER — Ambulatory Visit
Admission: RE | Admit: 2017-01-23 | Discharge: 2017-01-23 | Disposition: A | Discharge status: Home / Self Care | Payer: Medicare Other | Source: Ambulatory Visit | Attending: Family Medicine | Admitting: Family Medicine

## 2017-01-23 DIAGNOSIS — R42 Dizziness and giddiness: Secondary | ICD-10-CM | POA: Diagnosis not present

## 2017-02-21 ENCOUNTER — Encounter: Payer: Self-pay | Admitting: Pulmonary Disease

## 2017-02-21 ENCOUNTER — Ambulatory Visit (INDEPENDENT_AMBULATORY_CARE_PROVIDER_SITE_OTHER): Payer: Medicare Other | Admitting: Pulmonary Disease

## 2017-02-21 VITALS — BP 140/80 | HR 74 | Temp 97.6°F | Ht 62.0 in | Wt 89.5 lb

## 2017-02-21 DIAGNOSIS — I5032 Chronic diastolic (congestive) heart failure: Secondary | ICD-10-CM

## 2017-02-21 DIAGNOSIS — R636 Underweight: Secondary | ICD-10-CM

## 2017-02-21 DIAGNOSIS — E089 Diabetes mellitus due to underlying condition without complications: Secondary | ICD-10-CM

## 2017-02-21 DIAGNOSIS — J449 Chronic obstructive pulmonary disease, unspecified: Secondary | ICD-10-CM | POA: Diagnosis not present

## 2017-02-21 DIAGNOSIS — E1169 Type 2 diabetes mellitus with other specified complication: Secondary | ICD-10-CM

## 2017-02-21 DIAGNOSIS — I255 Ischemic cardiomyopathy: Secondary | ICD-10-CM

## 2017-02-21 DIAGNOSIS — I10 Essential (primary) hypertension: Secondary | ICD-10-CM | POA: Diagnosis not present

## 2017-02-21 DIAGNOSIS — E785 Hyperlipidemia, unspecified: Secondary | ICD-10-CM | POA: Diagnosis not present

## 2017-02-21 MED ORDER — AEROCHAMBER MV MISC: 0 refills | Status: AC

## 2017-02-21 MED ORDER — BUDESONIDE-FORMOTEROL FUMARATE 160-4.5 MCG/ACT IN AERO: 2.0000 | INHALATION_SPRAY | Freq: Two times a day (BID) | RESPIRATORY_TRACT | 0 refills | Status: DC

## 2017-02-21 NOTE — Progress Notes (Signed)
Subjective:     Patient ID: [name], female   DOB: [birth year], 83 y.o.   MRN: [id number]  HPI 83 y/o BF w/ severe COPD/emphysema, multifactorial dyspnea & finally quit smoking after [facility] 07/2016 w/ 65+pack-yr hx, pulmHTN, diastolicCHF, PAF, underweight & FTT; referred by PCP- DrKoirala for pulm eval...   ~  May 27, 2015:  Initial pulmonary consult w/ SN>      66 y/o BF referred by DrCrossley for a pulmonary evaluation due to dyspnea> He saw her yest in his office for f/u sinus drainage & cough, treated w/ ZPak/ then Doxy, he did a CXR showing Emphysema & referred her here;  Her PCP is Dr. Maudie Mercury Shelton/ Dr. Glendale Chard ([facility]);  Pt states that she "got a cold" about 3-4 wks ago & that's when her symptoms started- rattling cough, grey sput, no hemoptysis, incr SOB w/ activity esp rushing/ stairs/ etc but she says ADLs are OK & most stenuous activ is mailbox & back (not exercising);  She is a current everyday smoker> started in early teens, smoked for 70 yrs up to 1ppd max, and cut back to 3 cig/d for the last several months (~65+pack-yr smoking hx);  She states that sh'e in the process of quitting- "I just take a few puffs, I have the discipline";  She denies hx prior lung problems> rare episode of bronchitis requiring antibiotics, no prev pneumonia, no hx TB or known exposure, no prev Hosp for resp illness;  No known hx of asbestos exposure or other toxins;  She has hx sinusitis & prev sinus surg at [facility] in the 80s;  She has signif medical issues including> Underwt w/ BMI=15, HBP, CAD, CHF, HL, DM, etc;  FamHx is neg for resp problems (see below)...    Current Meds>  AlbutHFA prn, Flonase/ Claritin, ASA81, Lanoxin0.125, Coreg3.125Bid, Amlod5, Lotensin40, Lasix20, Lip10, Metform500, Glimep2, Vits...  EXAM shows Afeb, VSS, O2sat=95% on RA at rest;  83#, 5'3"Tall, BMI<15;  HEENT- neg, mallamapti1;  Chest- congested cough, scat rhonchi, no w/r/consolidation;  Heart- RR w/o m/r/g;  Abd-  thin, soft, neg;  Ext- w/o c/c/e; Neuro- nonfocal...  2DEcho 05/01/2010 showed norm LVF w/ EF=60-65% & no regional wall motion abn, Gr1DD, mild MR, +atrial septal aneurysm, incr PAsys=8mHg  CT Angio Chest 09/04/14 in EPIC showed NEG for PE...   [facility] norm heart size, COPD/ hyperinflated/ flattened diaph/ NAD, degen changes in Tspine...  Spirometry 05/27/15 showed FVC=1.38 (66%), FEV1=0.66 (44%), %1sec=48, mid-flows reduced at 23% predicted;  This is c/w severe airflow obstruction & GOLD Stage3 COPD...  Ambulatory Oximetry 05/27/15> O2sat=100% on RA at rest w/ pulse=78/min; she ambulated 2 Laps and stopped due to fatigue, lowest O2sat=98% w/ pulse=114/min... IMP>>    Dyspnea is multifactorial-- due to COPD/emphysema, smoking, heart dis, deconditioning/ sedentary lifestyle/ 83y/o...    COPD/ emphysema    Cigarette smoker, still smoking 3cig/d, 65+pack-yr smoking hx    PulmHTN w/ 2DEcho in 2012 showing PAsys est~410mg    CARDIAC Hx>  followed by DrCherly Hensenor HBP, diastolicCHF, hx AFib    MEDICAL Hx>  follwed by PCP for HBP, HL, DM, FTT/ underwt, etc... PLAN>>    We reviewed her objective data and the Dx of COPD/emphysema (GOLD Stage3); she understands that she must quit all smoking, and rec to start regular inhaler use-- SYMBICORT160-2spBid, INCRUSE-once daily, +OTC MUCINEX60020m-2Bid, +nutritional supplements daily;  She needs regular f/u w/ her PCP to work on med compliance, nutritional support, etc... ROV  recheck in 6wks.  ~  July 21, 2015:  30moROV w/ SN>  BKayleareports that her SOB is improved on the Symbicort160-2spBid, Incruse once daily, ProventilHFA as needed ("my breathing is much better" she says); she is not using the Mucinex and unfortunately still smoking a few cigs per day; she still has a mild cough w/ some thick beige sput production; she denies CP/ tightness/ f/c/s... We reviewed her prev eval/ CXR/ spirometry/ etc...  EXAM shows Afeb, VSS, O2sat=94% on RA at  rest;  84#, 5'3"Tall, BMI<15;  HEENT- neg, mallamapti2;  Chest- congested cough, decrBS, scat rhonchi, no w/r/consolidation;  Heart- RR w/o m/r/g;  Abd- thin, soft, neg;  Ext- w/o c/c/e...  IMP/PLAN>>  She is again advised to quit all smoking! We reviewed med regimen w/ Symbicort160, Incruse, Mucinex, AlbutHFA rescue inhaler, etc;  Reminded to incr exercise program, walk regularly, eat better and gain some weight...  ~  August 15, 2016:  113moOV w/ SN>  [name] [place]/19/18 w/ sinus congestion & drainage, +SOB, cough, wheezing- still smoking a few; she had the Symbicort but I suspect NOT using regularly, she was using her daughter's Albuterol rescue inhaler;  She desaturated w/ exercise (87% after 1 lap on RA & was started on Home O2, Ceftin, & Pred taper; asked to restart INCRUSE & given a rescue inhaler of her own... She improved on the regimen but continued smoking &  gradually deteriorated after she finished the Pred, stopped the Incruse, & backed off on the Symbicort...  She presented to the ER 07/26/16 w/ worsening SOB found to have a COPD exac, acute on chr hypoxemic resp failure, acute on chr diastolic CHF, along w/ her medical issues of wt loss, DM, malnutrition, and weakness => ADM by [facility] 4/3 - 08/02/16 and Dis[facility] for short term rehab; she was treated w/ antibiotics, IV Solumedrol, BiPAP, NEBs, etc;  Disch on Pred taper (off now), NEBs vs Proair prn, SYMBICORT160-2spBid, not on LAMA rx;  Cardiac meds as below;      Dyspnea is multifactorial-- due to COPD/emphysema, smoking, heart dis, deconditioning/ sedentary lifestyle/ 8327/o => multidisciplinary approach supervised by PCP...    COPD/ emphysema-- on Symbicort160-2spBid, Incruse- once daily, O2 24/7 now & we will check ONO...    Cigarette smoker, still [facility]    PulmHTN w/ 2DEcho in 2012 showing PAsys est~4353m    CARDIAC Hx>  followed by DrNCherly Hensenr HBP, diastolicCHF, hx AFib, etc; on  ASA81, Coreg6.25Bid, Amlod5, Lotensin10, Lasix20-2/d, Lanoxin0.125-1/2 daily    Medical Issues> HBP, HL (Lip10), DM (Metform, Glimep), FTT/ physical deconditioning/ poor nutrition; prev followed by DrKCurly Rimow seeing DrPandy, PieJacksonr note of 08/04/16 is reviewed... EXAM shows Afeb, VSS, O2sat=95% on 2L O2;  76#, 5'3"Tall, BMI<15;  HEENT- neg, mallamapti2, tongue coated;  Chest- congested cough, decrBS, scat rhonchi, no w/r/consolidation;  Heart- RR w/o m/r/g;  Abd- thin, soft, neg;  Ext- w/o c/c/e...   CXR 07/26/16 (independently reviewed by me in the PACS system) showed cardiomeg, mild basilar scarring & lingular atx, scoliosis & DDD in Tspine...  CT Angio Chest 07/26/16>  Norm heart size, thor Ao atherosclerosis, NEG for PE; no adenopathy, +centrilob emphysema but otherw clear lungs, small effusions  2DEcho 07/29/16>  Diffuse HK, abn septal motion, reduced LVF w/ EF=40-45%, mild MR, aortic calcif, RVsys~23...  LABS 07/2016>  Chems- Na~132, HCO3~36, BS~269, A1c=6.9, Cr~0.77;  CBC- Hg~11-12, WBC~15=>12K IMP/PLAN>>  [name] MUST take  all meds as directed (needs admin from Family member care giver) and must commit to NO SMOKING;  We will check ONO in the interim;  Tongue coated- needs attn to oral hygiene, rx Nystatin;  Needs PT/ PT/ visiting nurses, etc... We plan ROV recheck 33mo..  ~  Sep 14, 2016:  183moOV & pulmonary follow up visit> Her PCP is listed as DrKoirala- ?when last seen?  She tells me that she is still NOT SMOKING but "I have urges";  She says her chest & breathing are ok-- denies cough, sput, SOB & CP;  She does have DOE eg w/ stairs at home, which she takes 1 step at a time, sometimes w/ help, & she feels as thopugh she is doing well...  I quizzed her about her meds-- she is NOT using the NEB (ran out of the Duoneb med & never refilled it);  On O2 at 2L/min flow 24/7, Symbicort160-2spBid, Incruse once daily, VentolinHFA prn; Flonase; it is hard to know what  she is really taking & we stressed the importance of no smoking & taking her meds every day as prescribed... we reviewed the ABOVE medical problems as well>>     EXAM shows Afeb, VSS, O2sat=95% on 2L O2;  76#, 5'3"Tall, BMI<15;  HEENT- neg, mallamapti2, tongue coated;  Chest- congested cough, decrBS, scat rhonchi, no w/r/consolidation;  Heart- RR w/o m/r/g;  Abd- thin, soft, neg;  Ext- w/o c/c/e...   LABS 09/14/16>  Chems- ok x HCO3=35, BS=141, (Cr=0.60 & LFTs wnl);  CBC- mild anemia w/ Hg=10.7;  TSH=0.87;  BNP=70...  Overnight oximetry done 08/18/16>  She only recorded for 2H;  O2sat nadir= 74%;  She spent 22 min w/ O2sat<88% IMP/PLAN>>  BiCoreanust commit to staying off the cigs;  For now we discussed using the Symbicort/ Incruse everyday & the VentolinHFA prn (she will leave off the NEBs as she has already stopped this on her own);  Must use her O2 24/7 and of course no smoke;  Medical issues per DrKoirala including- HBP, disatolicCHF, HxPAF, HL, DM, FTT/ physical deconditioning/ poor nutrition    Note: >50% of this 3077mrov was spent in counseling 7 coordination of care.  ~  November 21, 2016:  11mo51mo & [name] reports that she has seen her PCP-Dr KoirDorthy Cooler do not have notes, he is not in epic, pt reports he started an Iron tab), and Cards-DrNishan> seen 09/22/16, note reviewed, Hx HBP, PAFib, DiastolicCHF, & underweight at 80lbs w/ BMI~15; he noted ongoing tobacco abuse;  Pt tells me she quit smoking 07/2016 Hosp;  She is holding NSR & not on Coumadin; her last 2DEcho in 2012 showed EF=60-65% and Gr1DD;  On ECASA81, Lotensin10, CoreEmsworthid, Digoxin0.125-1/2tab, Lasix40=> he cut her dose to '20mg'$ .d...  [facility] conversation w/ care giver (daughRamona MillSabra Heckdicated that "things are going great" and pt "hasn't had any problems, feeling good"; having met her goals- [facility] signed off... WE REVIEWED ABOVE PROB LIST...    EXAM shows Afeb, VSS, O2sat=95% on 2L  O2;  89#, 5'3"Tall, BMI~16;  HEENT- neg, mallamapti2, tongue coated;  Chest- congested cough, decrBS, scat rhonchi, no w/r/consolidation;  Heart- RR w/o m/r/g;  Abd- thin, soft, neg;  Ext- w/o c/c/e...  IMP/PLAN>>  Again [name] to do her NEB Tid followed by the Advair-2spBid & Incruse once daily;  DrNiCherly Hensenmanaging her PAF, Systolic & Diastolic CHF, HBP etc;  DrKoirala manages her HBP, DM, HL,  Anemia etc...    ~  February 21, 2017:  3 month ROV & pulmonary recheck> [name] reports stable overall, she has gained 2# to 90# today;  She notes some dry coughing, SOB is the same (eg-stairs in her split level home), her last cig was 07/2016 hosp;  She has home health aide & getting home PT... We reviewed the following medical problems during today's office visit >>     Dyspnea is multifactorial-- due to COPD/emphysema, smoking, heart dis, deconditioning/ sedentary lifestyle/ 83 y/o => multidisciplinary approach supervised by PCP...    COPD/ emphysema-- on Symbicort160-2spBid, Incruse- once daily, & O2 24/7...    Ex-Cigarette smoker, prev smoking 3cig/d prior to 07/2016 [facility] & quit then, 65+pack-yr smoking hx    PulmHTN w/ 2DEcho in 2012 showing PAsys est~39mHg    CARDIAC Hx>  followed by DCherly Hensenfor HBP, sys+diastolicCHF, hx AFib, etc; on ASA81, Coreg6.25Bid, Amlod5, Lotensin10, Lasix20-2/d, Lanoxin0.125-1/2 daily    Medical Issues> HBP, HL (Lip10), DM (Metform, Glimep), FTT/ physical deconditioning/ poor nutrition; prev followed by DCurly Rim now seeing DrPandy, PTetoniaher note of 08/04/16 is reviewed... EXAM shows Afeb, VSS, O2sat=97% on 2L O2;  90#, 5'3"Tall, BMI~16;  HEENT- neg, mallamapti2, tongue coated;  Chest- congested cough, decrBS, scat rhonchi, no w/r/consolidation;  Heart- RR w/o m/r/g;  Abd- thin, soft, neg;  Ext- w/o c/c/e...  IMP/PLAN>>  Continue Symbicort/ incruse & prn Albut rescue inhaler via aerochanber; need to stay off cigs and incr activity/  exercise...     Past Medical History:  Diagnosis Date  . Acute bronchitis   . Acute on chronic respiratory failure with hypoxia ([place]   . Benign hypertensive renal disease   . CAD (coronary artery disease)   . Cardiomyopathy    non-ischemic 04/2008 EF 45% cardiac MRI no scar  . CHF (congestive heart failure) ([place]   . Chronic sinusitis   . COPD (chronic obstructive pulmonary disease) ([place]   . COPD exacerbation ([place]   . Depression   . Diabetes mellitus without complication, without long-term current use of insulin ([place]   . Dyslipidemia associated with type 2 diabetes mellitus ([place]   . Dyspnea on exertion    chronic  . Hypercholesterolemia   . Hypertension   . On home oxygen therapy    "3L; 24/7; for the last 2 1/2 weeks; none before that" (07/26/2016)  . Protein-calorie malnutrition, severe ([place]   . Sinus headache    "chronic"  . Stroke ([facility] 1997   "mild"  . Type II diabetes mellitus ([facility]     Past Surgical History:  Procedure Laterality Date  . carotid angiogram     2006  . ESOPHAGOGASTRODUODENOSCOPY     with foreign body removal  . NASAL SINUS SURGERY  ~1974;~ 1976  . TONSILLECTOMY     "when I was a chld"    Outpatient Encounter Prescriptions as of 02/21/2017  Medication Sig  . albuterol (VENTOLIN HFA) 108 (90 Base) MCG/ACT inhaler Inhale 2 puffs into the lungs every 6 (six) hours as needed for wheezing or shortness of breath.  .Marland Kitchenaspirin EC 81 MG tablet Take 81 mg by mouth daily.  .Marland Kitchenatorvastatin (LIPITOR) 10 MG tablet Take 1 tablet (10 mg total) by mouth daily at 6 PM.  . benazepril (LOTENSIN) 10 MG tablet Take 1 tablet (10 mg total) by mouth daily.  . budesonide-formoterol (SYMBICORT) 160-4.5 MCG/ACT inhaler INHALE 2 PUFFS INTO THE LUNGS TWICE DAILY  . carvedilol (COREG) 6.25 MG tablet Take 1  tablet (6.25 mg total) by mouth 2 (two) times daily with a meal.  . digoxin (DIGOX) 0.125 MG tablet Take 0.5 tablets (0.0625 mg total) by mouth daily.  . ferrous sulfate  325 (65 FE) MG tablet Take 325 mg by mouth daily with breakfast.  . fluticasone (FLONASE) 50 MCG/ACT nasal spray Place 1 spray into both nostrils daily as needed for allergies.  . furosemide (LASIX) 20 MG tablet Take 1 tablet (20 mg total) by mouth daily.  . insulin aspart (NOVOLOG) 100 UNIT/ML injection Inject into the skin daily as needed for high blood sugar (For CBG >300).  . metFORMIN (GLUCOPHAGE-XR) 500 MG 24 hr tablet Take 1,000 mg by mouth 2 (two) times daily.   . Multiple Vitamin (MV-ONE PO) Take 1 tablet by mouth daily.  Marland Kitchen umeclidinium bromide (INCRUSE ELLIPTA) 62.5 MCG/INH AEPB Inhale 1 puff into the lungs daily.  Marland Kitchen UNABLE TO FIND Med Name: Med pass 120 mL by mouth 3 times daily  . budesonide-formoterol (SYMBICORT) 160-4.5 MCG/ACT inhaler Inhale 2 puffs into the lungs 2 (two) times daily.  Marland Kitchen Spacer/Aero-Holding Chambers (AEROCHAMBER MV) inhaler Use as instructed   No facility-administered encounter medications on file as of 02/21/2017.     No Known Allergies   Immunization History  Administered Date(s) Administered  . Influenza,inj,Quad PF,6+ Mos 01/24/2015, 01/24/2016  . Pneumococcal-Unspecified 12/25/2014  NEED TO CHECK w/ DrSanders/ Koirala/ Pandy about her IMMUNIZ HISTORY...   Current Medications, Allergies, Past Medical History, Past Surgical History, Family History, and Social History were reviewed in [employer] record.   Review of Systems             All symptoms NEG except where BOLDED >>  Constitutional:  F/C/S, fatigue, anorexia, unexpected weight change. HEENT:  HA, visual changes, hearing loss, earache, nasal symptoms, sore throat, mouth sores, hoarseness. Resp:  cough, sputum, hemoptysis; SOB, tightness, wheezing. Cardio:  CP, palpit, DOE, orthopnea, edema. GI:  N/V/D/C, blood in stool; reflux, abd pain, distention, gas. GU:  dysuria, freq, urgency, hematuria, flank pain, voiding difficulty. MS:  joint pain, swelling, tenderness,  decr ROM; neck pain, back pain, etc. Neuro:  HA, tremors, seizures, dizziness, syncope, weakness, numbness, gait abn. Skin:  suspicious lesions or skin rash. Heme:  adenopathy, bruising, bleeding. Psyche:  confusion, agitation, sleep disturbance, hallucinations, anxiety, depression suicidal.   Objective:   Physical Exam       Vital Signs:  Reviewed...   General:  WD, underweight, 83 y/o BF in NAD; alert & oriented; pleasant & cooperative... HEENT:  /AT; Conjunctiva- pink, Sclera- nonicteric, EOM-wnl, PERRLA, Fundi-benign; EACs-clear, TMs-wnl; NOSE-sl red; THROAT-clear & wnl.  Neck:  Supple w/ decr ROM; no JVD; normal carotid impulses w/o bruits; no thyromegaly or nodules palpated; no lymphadenopathy.  Chest:  DecrBS, clear without wheezes, rales, or rhonchi heard. Heart:  Regular Rhythm; norm S1 & S2 without murmurs, rubs, or gallops detected. Abdomen:  Soft & nontender- no guarding or rebound; normal bowel sounds; no organomegaly or masses palpated. Ext:  decrROM; without deformities +arthritic changes; no varicose veins, +venous insuffic, no edema;  Pulses intact w/o bruits. Neuro:  No focal neuro deficits Derm:  No lesions noted; no rash etc. Lymph:  No cervical, supraclavicular, axillary, or inguinal adenopathy palpated.   Assessment:      IMP>>    Dyspnea is multifactorial-- due to COPD/emphysema, smoking, heart dis, deconditioning/ sedentary lifestyle/ 83 y/o...    COPD/ emphysema    Cigarette smoker, still smoking 3cig/d, 65+pack-yr smoking hx  PulmHTN w/ 2DEcho in 2012 showing PAsys est~11mHg    CARDIAC Hx>  followed by DCherly Hensenfor HBP, diastolicCHF, hx AFib    MEDICAL Hx>  follwed by PCP for HBP, HL, DM, FTT/ underwt, etc...  PLAN>> 05/27/15>   We reviewed her objective data and the Dx of COPD/emphysema (GOLD Stage3); she understands that she must quit all smoking, and rec to start regular inhaler use-- SYMBICORT160-2spBid, INCRUSE-once daily, +OTC MUCINEX'600mg'$  1-2Bid,  +nutritional supplements daily;  She needs regular f/u w/ her PCP to work on med compliance, nutritional support, etc... ROV recheck in 6wks. 07/21/15>   She is again advised to quit all smoking! We reviewed med regimen w/ Symbicort160, Incruse, Mucinex, AlbutHFA rescue inhaler, etc;  Reminded to incr exercise program, walk regularly, eat better and gain some weight... 08/15/16>   [name] MUST take all meds as directed (needs admin from Family member care giver) and must commit to NO SMOKING;  We will check ONO in the interim;  Tongue coated- needs attn to oral hygiene, rx Nystatin;  Needs PT/ PT/ visiting nurses, etc... We plan ROV recheck 153mo. 09/15/26>   [name] must commit to staying off the cigs;  For now we discussed using the Symbicort/ Incruse everyday & the VentolinHFA prn (she will leave off the NEBs as she has already stopped this on her own);  Must use her O2 24/7 and of course no smoke;  Medical issues per DrKoirala including- HBP, disatolicCHF, HxPAF, HL, DM, FTT/ physical deconditioning/ poor nutrition 11/21/16>   Again [name] reminded to do her NEB Tid followed by the Advair-2spBid & Incruse once daily;  DrCherly Hensens managing her PAF, Systolic & Diastolic CHF, HBP etc;  DrKoirala manages her HBP, DM, HL, Anemia etc 02/21/17>  Continue Symbicort/ incruse & prn Albut rescue inhaler via aerochanber; need to stay off cigs and incr activity/ exercise.   Plan:     Patient's Medications  New Prescriptions   BUDESONIDE-FORMOTEROL (SYMBICORT) 160-4.5 MCG/ACT INHALER    Inhale 2 puffs into the lungs 2 (two) times daily.   SPACER/AERO-HOLDING CHAMBERS (AEROCHAMBER MV) INHALER    Use as instructed  Previous Medications   ALBUTEROL (VENTOLIN HFA) 108 (90 BASE) MCG/ACT INHALER    Inhale 2 puffs into the lungs every 6 (six) hours as needed for wheezing or shortness of breath.   ASPIRIN EC 81 MG TABLET    Take 81 mg by mouth daily.   ATORVASTATIN (LIPITOR) 10 MG TABLET    Take 1 tablet (10 mg total)  by mouth daily at 6 PM.   BENAZEPRIL (LOTENSIN) 10 MG TABLET    Take 1 tablet (10 mg total) by mouth daily.   BUDESONIDE-FORMOTEROL (SYMBICORT) 160-4.5 MCG/ACT INHALER    INHALE 2 PUFFS INTO THE LUNGS TWICE DAILY   CARVEDILOL (COREG) 6.25 MG TABLET    Take 1 tablet (6.25 mg total) by mouth 2 (two) times daily with a meal.   DIGOXIN (DIGOX) 0.125 MG TABLET    Take 0.5 tablets (0.0625 mg total) by mouth daily.   FERROUS SULFATE 325 (65 FE) MG TABLET    Take 325 mg by mouth daily with breakfast.   FLUTICASONE (FLONASE) 50 MCG/ACT NASAL SPRAY    Place 1 spray into both nostrils daily as needed for allergies.   FUROSEMIDE (LASIX) 20 MG TABLET    Take 1 tablet (20 mg total) by mouth daily.   INSULIN ASPART (NOVOLOG) 100 UNIT/ML INJECTION    Inject into the skin daily as needed for high blood  sugar (For CBG >300).   METFORMIN (GLUCOPHAGE-XR) 500 MG 24 HR TABLET    Take 1,000 mg by mouth 2 (two) times daily.    MULTIPLE VITAMIN (MV-ONE PO)    Take 1 tablet by mouth daily.   UMECLIDINIUM BROMIDE (INCRUSE ELLIPTA) 62.5 MCG/INH AEPB    Inhale 1 puff into the lungs daily.   UNABLE TO FIND    Med Name: Med pass 120 mL by mouth 3 times daily  Modified Medications   No medications on file  Discontinued Medications   No medications on file

## 2017-02-21 NOTE — Patient Instructions (Signed)
Today we updated your med list in our EPIC system...    Continue your current medications the same...  We gave you the 2018 FLU shot today...  Ask Dr. Docia Chuck to be sure you've had the 2 different pneumonia shots (one each of the Pneumovax23 and the Prevnar13 are required after age 81)  We will request HOME PHYSICAL THERAPY to help build strength & support your ambulation...  Call for any questions...  Let's plan a follow up visit in 45mo, sooner if needed for problems.Marland KitchenMarland Kitchen

## 2017-02-22 DIAGNOSIS — I5032 Chronic diastolic (congestive) heart failure: Secondary | ICD-10-CM | POA: Diagnosis not present

## 2017-02-22 DIAGNOSIS — I110 Hypertensive heart disease with heart failure: Secondary | ICD-10-CM | POA: Diagnosis not present

## 2017-02-22 DIAGNOSIS — J449 Chronic obstructive pulmonary disease, unspecified: Secondary | ICD-10-CM | POA: Diagnosis not present

## 2017-02-22 DIAGNOSIS — M6281 Muscle weakness (generalized): Secondary | ICD-10-CM | POA: Diagnosis not present

## 2017-02-22 DIAGNOSIS — E119 Type 2 diabetes mellitus without complications: Secondary | ICD-10-CM | POA: Diagnosis not present

## 2017-02-22 DIAGNOSIS — Z7984 Long term (current) use of oral hypoglycemic drugs: Secondary | ICD-10-CM | POA: Diagnosis not present

## 2017-03-01 DIAGNOSIS — Z7984 Long term (current) use of oral hypoglycemic drugs: Secondary | ICD-10-CM | POA: Diagnosis not present

## 2017-03-01 DIAGNOSIS — M6281 Muscle weakness (generalized): Secondary | ICD-10-CM | POA: Diagnosis not present

## 2017-03-01 DIAGNOSIS — J449 Chronic obstructive pulmonary disease, unspecified: Secondary | ICD-10-CM | POA: Diagnosis not present

## 2017-03-01 DIAGNOSIS — I5032 Chronic diastolic (congestive) heart failure: Secondary | ICD-10-CM | POA: Diagnosis not present

## 2017-03-01 DIAGNOSIS — E119 Type 2 diabetes mellitus without complications: Secondary | ICD-10-CM | POA: Diagnosis not present

## 2017-03-01 DIAGNOSIS — I110 Hypertensive heart disease with heart failure: Secondary | ICD-10-CM | POA: Diagnosis not present

## 2017-03-03 DIAGNOSIS — J449 Chronic obstructive pulmonary disease, unspecified: Secondary | ICD-10-CM | POA: Diagnosis not present

## 2017-03-03 DIAGNOSIS — I5032 Chronic diastolic (congestive) heart failure: Secondary | ICD-10-CM | POA: Diagnosis not present

## 2017-03-03 DIAGNOSIS — M6281 Muscle weakness (generalized): Secondary | ICD-10-CM | POA: Diagnosis not present

## 2017-03-03 DIAGNOSIS — Z7984 Long term (current) use of oral hypoglycemic drugs: Secondary | ICD-10-CM | POA: Diagnosis not present

## 2017-03-03 DIAGNOSIS — E119 Type 2 diabetes mellitus without complications: Secondary | ICD-10-CM | POA: Diagnosis not present

## 2017-03-03 DIAGNOSIS — I110 Hypertensive heart disease with heart failure: Secondary | ICD-10-CM | POA: Diagnosis not present

## 2017-03-07 DIAGNOSIS — I5032 Chronic diastolic (congestive) heart failure: Secondary | ICD-10-CM | POA: Diagnosis not present

## 2017-03-07 DIAGNOSIS — Z7984 Long term (current) use of oral hypoglycemic drugs: Secondary | ICD-10-CM | POA: Diagnosis not present

## 2017-03-07 DIAGNOSIS — I110 Hypertensive heart disease with heart failure: Secondary | ICD-10-CM | POA: Diagnosis not present

## 2017-03-07 DIAGNOSIS — M6281 Muscle weakness (generalized): Secondary | ICD-10-CM | POA: Diagnosis not present

## 2017-03-07 DIAGNOSIS — J449 Chronic obstructive pulmonary disease, unspecified: Secondary | ICD-10-CM | POA: Diagnosis not present

## 2017-03-07 DIAGNOSIS — E119 Type 2 diabetes mellitus without complications: Secondary | ICD-10-CM | POA: Diagnosis not present

## 2017-03-09 DIAGNOSIS — E119 Type 2 diabetes mellitus without complications: Secondary | ICD-10-CM | POA: Diagnosis not present

## 2017-03-09 DIAGNOSIS — M6281 Muscle weakness (generalized): Secondary | ICD-10-CM | POA: Diagnosis not present

## 2017-03-09 DIAGNOSIS — Z7984 Long term (current) use of oral hypoglycemic drugs: Secondary | ICD-10-CM | POA: Diagnosis not present

## 2017-03-09 DIAGNOSIS — I110 Hypertensive heart disease with heart failure: Secondary | ICD-10-CM | POA: Diagnosis not present

## 2017-03-09 DIAGNOSIS — I5032 Chronic diastolic (congestive) heart failure: Secondary | ICD-10-CM | POA: Diagnosis not present

## 2017-03-09 DIAGNOSIS — J449 Chronic obstructive pulmonary disease, unspecified: Secondary | ICD-10-CM | POA: Diagnosis not present

## 2017-03-14 DIAGNOSIS — M6281 Muscle weakness (generalized): Secondary | ICD-10-CM | POA: Diagnosis not present

## 2017-03-14 DIAGNOSIS — Z7984 Long term (current) use of oral hypoglycemic drugs: Secondary | ICD-10-CM | POA: Diagnosis not present

## 2017-03-14 DIAGNOSIS — I110 Hypertensive heart disease with heart failure: Secondary | ICD-10-CM | POA: Diagnosis not present

## 2017-03-14 DIAGNOSIS — E119 Type 2 diabetes mellitus without complications: Secondary | ICD-10-CM | POA: Diagnosis not present

## 2017-03-14 DIAGNOSIS — I5032 Chronic diastolic (congestive) heart failure: Secondary | ICD-10-CM | POA: Diagnosis not present

## 2017-03-14 DIAGNOSIS — J449 Chronic obstructive pulmonary disease, unspecified: Secondary | ICD-10-CM | POA: Diagnosis not present

## 2017-03-17 DIAGNOSIS — I110 Hypertensive heart disease with heart failure: Secondary | ICD-10-CM | POA: Diagnosis not present

## 2017-03-17 DIAGNOSIS — J449 Chronic obstructive pulmonary disease, unspecified: Secondary | ICD-10-CM | POA: Diagnosis not present

## 2017-03-17 DIAGNOSIS — I5032 Chronic diastolic (congestive) heart failure: Secondary | ICD-10-CM | POA: Diagnosis not present

## 2017-03-17 DIAGNOSIS — Z7984 Long term (current) use of oral hypoglycemic drugs: Secondary | ICD-10-CM | POA: Diagnosis not present

## 2017-03-17 DIAGNOSIS — E119 Type 2 diabetes mellitus without complications: Secondary | ICD-10-CM | POA: Diagnosis not present

## 2017-03-17 DIAGNOSIS — M6281 Muscle weakness (generalized): Secondary | ICD-10-CM | POA: Diagnosis not present

## 2017-03-21 DIAGNOSIS — I110 Hypertensive heart disease with heart failure: Secondary | ICD-10-CM | POA: Diagnosis not present

## 2017-03-21 DIAGNOSIS — I5032 Chronic diastolic (congestive) heart failure: Secondary | ICD-10-CM | POA: Diagnosis not present

## 2017-03-21 DIAGNOSIS — J449 Chronic obstructive pulmonary disease, unspecified: Secondary | ICD-10-CM | POA: Diagnosis not present

## 2017-03-21 DIAGNOSIS — M6281 Muscle weakness (generalized): Secondary | ICD-10-CM | POA: Diagnosis not present

## 2017-03-21 DIAGNOSIS — E119 Type 2 diabetes mellitus without complications: Secondary | ICD-10-CM | POA: Diagnosis not present

## 2017-03-21 DIAGNOSIS — Z7984 Long term (current) use of oral hypoglycemic drugs: Secondary | ICD-10-CM | POA: Diagnosis not present

## 2017-03-23 DIAGNOSIS — Z7984 Long term (current) use of oral hypoglycemic drugs: Secondary | ICD-10-CM | POA: Diagnosis not present

## 2017-03-23 DIAGNOSIS — M6281 Muscle weakness (generalized): Secondary | ICD-10-CM | POA: Diagnosis not present

## 2017-03-23 DIAGNOSIS — I110 Hypertensive heart disease with heart failure: Secondary | ICD-10-CM | POA: Diagnosis not present

## 2017-03-23 DIAGNOSIS — E119 Type 2 diabetes mellitus without complications: Secondary | ICD-10-CM | POA: Diagnosis not present

## 2017-03-23 DIAGNOSIS — J449 Chronic obstructive pulmonary disease, unspecified: Secondary | ICD-10-CM | POA: Diagnosis not present

## 2017-03-23 DIAGNOSIS — I5032 Chronic diastolic (congestive) heart failure: Secondary | ICD-10-CM | POA: Diagnosis not present

## 2017-05-10 ENCOUNTER — Other Ambulatory Visit: Payer: Self-pay | Admitting: Pulmonary Disease

## 2017-05-15 DIAGNOSIS — R490 Dysphonia: Secondary | ICD-10-CM | POA: Diagnosis not present

## 2017-05-15 DIAGNOSIS — E7800 Pure hypercholesterolemia, unspecified: Secondary | ICD-10-CM | POA: Diagnosis not present

## 2017-05-15 DIAGNOSIS — Z79899 Other long term (current) drug therapy: Secondary | ICD-10-CM | POA: Diagnosis not present

## 2017-05-15 DIAGNOSIS — E43 Unspecified severe protein-calorie malnutrition: Secondary | ICD-10-CM | POA: Diagnosis not present

## 2017-05-15 DIAGNOSIS — I10 Essential (primary) hypertension: Secondary | ICD-10-CM | POA: Diagnosis not present

## 2017-05-15 DIAGNOSIS — E119 Type 2 diabetes mellitus without complications: Secondary | ICD-10-CM | POA: Diagnosis not present

## 2017-05-15 DIAGNOSIS — J449 Chronic obstructive pulmonary disease, unspecified: Secondary | ICD-10-CM | POA: Diagnosis not present

## 2017-05-15 DIAGNOSIS — I482 Chronic atrial fibrillation: Secondary | ICD-10-CM | POA: Diagnosis not present

## 2017-05-30 DIAGNOSIS — J383 Other diseases of vocal cords: Secondary | ICD-10-CM | POA: Diagnosis not present

## 2017-05-30 DIAGNOSIS — K219 Gastro-esophageal reflux disease without esophagitis: Secondary | ICD-10-CM | POA: Diagnosis not present

## 2017-06-04 ENCOUNTER — Other Ambulatory Visit: Payer: Self-pay | Admitting: Pulmonary Disease

## 2017-06-19 ENCOUNTER — Other Ambulatory Visit: Payer: Self-pay | Admitting: Nurse Practitioner

## 2017-06-22 ENCOUNTER — Ambulatory Visit (INDEPENDENT_AMBULATORY_CARE_PROVIDER_SITE_OTHER): Payer: Medicare Other | Admitting: Pulmonary Disease

## 2017-06-22 ENCOUNTER — Encounter: Payer: Self-pay | Admitting: Pulmonary Disease

## 2017-06-22 VITALS — BP 122/62 | HR 81 | Temp 98.3°F | Ht 62.0 in | Wt 89.6 lb

## 2017-06-22 DIAGNOSIS — E089 Diabetes mellitus due to underlying condition without complications: Secondary | ICD-10-CM

## 2017-06-22 DIAGNOSIS — I10 Essential (primary) hypertension: Secondary | ICD-10-CM

## 2017-06-22 DIAGNOSIS — I5032 Chronic diastolic (congestive) heart failure: Secondary | ICD-10-CM | POA: Diagnosis not present

## 2017-06-22 DIAGNOSIS — R636 Underweight: Secondary | ICD-10-CM

## 2017-06-22 DIAGNOSIS — E785 Hyperlipidemia, unspecified: Secondary | ICD-10-CM

## 2017-06-22 DIAGNOSIS — E1169 Type 2 diabetes mellitus with other specified complication: Secondary | ICD-10-CM

## 2017-06-22 DIAGNOSIS — J449 Chronic obstructive pulmonary disease, unspecified: Secondary | ICD-10-CM | POA: Diagnosis not present

## 2017-06-22 NOTE — Patient Instructions (Signed)
Today we updated your med list in our EPIC system...    Continue your current medications the same...  Try to increase your nutritional supplements to gain some weight...  Continue the Symbicort & Incruse as you are doing...  Continue to avoid exposure to infections...  Call for any questions...  Let's plan a follow up visit in 87mo, sooner if needed for problems.Marland KitchenMarland Kitchen

## 2017-06-22 NOTE — Progress Notes (Signed)
Subjective:     Patient ID: [name], female   DOB: [birth year], 84 y.o.   MRN: [id number]  HPI 84 y/o BF w/ severe COPD/emphysema, multifactorial dyspnea & finally quit smoking after [facility] 07/2016 w/ 65+pack-yr hx, pulmHTN, diastolicCHF, PAF, underweight & FTT; referred by PCP- DrKoirala for pulm eval...   ~  May 27, 2015:  Initial pulmonary consult w/ SN>      66 y/o BF referred by DrCrossley for a pulmonary evaluation due to dyspnea> He saw her yest in his office for f/u sinus drainage & cough, treated w/ ZPak/ then Doxy, he did a CXR showing Emphysema & referred her here;  Her PCP is Dr. Maudie Mercury Shelton/ Dr. Glendale Chard ([facility]);  Pt states that she "got a cold" about 3-4 wks ago & that's when her symptoms started- rattling cough, grey sput, no hemoptysis, incr SOB w/ activity esp rushing/ stairs/ etc but she says ADLs are OK & most stenuous activ is mailbox & back (not exercising);  She is a current everyday smoker> started in early teens, smoked for 70 yrs up to 1ppd max, and cut back to 3 cig/d for the last several months (~65+pack-yr smoking hx);  She states that sh'e in the process of quitting- "I just take a few puffs, I have the discipline";  She denies hx prior lung problems> rare episode of bronchitis requiring antibiotics, no prev pneumonia, no hx TB or known exposure, no prev Hosp for resp illness;  No known hx of asbestos exposure or other toxins;  She has hx sinusitis & prev sinus surg at [facility] in the 80s;  She has signif medical issues including> Underwt w/ BMI=15, HBP, CAD, CHF, HL, DM, etc;  FamHx is neg for resp problems (see below)...    Current Meds>  AlbutHFA prn, Flonase/ Claritin, ASA81, Lanoxin0.125, Coreg3.125Bid, Amlod5, Lotensin40, Lasix20, Lip10, Metform500, Glimep2, Vits...  EXAM shows Afeb, VSS, O2sat=95% on RA at rest;  83#, 5'3"Tall, BMI<15;  HEENT- neg, mallamapti1;  Chest- congested cough, scat rhonchi, no w/r/consolidation;  Heart- RR w/o m/r/g;  Abd-  thin, soft, neg;  Ext- w/o c/c/e; Neuro- nonfocal...  2DEcho 05/01/2010 showed norm LVF w/ EF=60-65% & no regional wall motion abn, Gr1DD, mild MR, +atrial septal aneurysm, incr PAsys=8mHg  CT Angio Chest 09/04/14 in EPIC showed NEG for PE...   [facility] heart [facility], COPD/ hyperinflated/ flattened diaph/ NAD, degen changes in Tspine...  Spirometry 05/27/15 showed FVC=1.38 (66%), FEV1=0.66 (44%), %1sec=48, mid-flows reduced at 23% predicted;  This is c/w severe airflow obstruction & GOLD Stage3 COPD...  Ambulatory Oximetry 05/27/15> O2sat=100% on RA at rest w/ pulse=78/min; she ambulated 2 Laps and stopped due to fatigue, lowest O2sat=98% w/ pulse=114/min... IMP>>    Dyspnea is multifactorial-- due to COPD/emphysema, smoking, heart dis, deconditioning/ sedentary lifestyle/ 84y/o...    COPD/ emphysema    Cigarette smoker, still smoking 3cig/d, 65+pack-yr smoking hx    PulmHTN w/ 2DEcho in 2012 showing PAsys est~410mg    CARDIAC Hx>  followed by DrCherly Hensenor HBP, diastolicCHF, hx AFib    MEDICAL Hx>  follwed by PCP for HBP, HL, DM, FTT/ underwt, etc... PLAN>>    We reviewed her objective data and the Dx of COPD/emphysema (GOLD Stage3); she understands that she must quit all smoking, and rec to start regular inhaler use-- SYMBICORT160-2spBid, INCRUSE-once daily, +OTC MUCINEX60020m-2Bid, +nutritional supplements daily;  She needs regular f/u w/ her PCP to work on med compliance, nutritional support, etc... ROV  recheck in 6wks.  ~  July 21, 2015:  30moROV w/ SN>  [name] that her SOB is improved on the Symbicort160-2spBid, Incruse once daily, ProventilHFA as needed ("my breathing is much better" she says); she is not using the Mucinex and unfortunately still smoking a few cigs per day; she still has a mild cough w/ some thick beige sput production; she denies CP/ tightness/ f/c/s... We reviewed her prev eval/ CXR/ spirometry/ etc...  EXAM shows Afeb, VSS, O2sat=94% on RA at  rest;  84#, 5'3"Tall, BMI<15;  HEENT- neg, mallamapti2;  Chest- congested cough, decrBS, scat rhonchi, no w/r/consolidation;  Heart- RR w/o m/r/g;  Abd- thin, soft, neg;  Ext- w/o c/c/e...  IMP/PLAN>>  She is again advised to quit all smoking! We reviewed med regimen w/ Symbicort160, Incruse, Mucinex, AlbutHFA rescue inhaler, etc;  Reminded to incr exercise program, walk regularly, eat better and gain some weight...  ~  August 15, 2016:  113moOV w/ SN>  [name] [place]/19/18 w/ sinus congestion & drainage, +SOB, cough, wheezing- still smoking a few; she had the Symbicort but I suspect NOT using regularly, she was using her daughter's Albuterol rescue inhaler;  She desaturated w/ exercise (87% after 1 lap on RA & was started on Home O2, Ceftin, & Pred taper; asked to restart INCRUSE & given a rescue inhaler of her own... She improved on the regimen but continued smoking &  gradually deteriorated after she finished the Pred, stopped the Incruse, & backed off on the Symbicort...  She presented to the ER 07/26/16 w/ worsening SOB found to have a COPD exac, acute on chr hypoxemic resp failure, acute on chr diastolic CHF, along w/ her medical issues of wt loss, DM, malnutrition, and weakness => ADM by [facility] 4/3 - 08/02/16 and Dis[facility] for short term rehab; she was treated w/ antibiotics, IV Solumedrol, BiPAP, NEBs, etc;  Disch on Pred taper (off now), NEBs vs Proair prn, SYMBICORT160-2spBid, not on LAMA rx;  Cardiac meds as below;      Dyspnea is multifactorial-- due to COPD/emphysema, smoking, heart dis, deconditioning/ sedentary lifestyle/ 8327/o => multidisciplinary approach supervised by PCP...    COPD/ emphysema-- on Symbicort160-2spBid, Incruse- once daily, O2 24/7 now & we will check ONO...    Cigarette smoker, still [facility]    PulmHTN w/ 2DEcho in 2012 showing PAsys est~4353m    CARDIAC Hx>  followed by DrNCherly Hensenr HBP, diastolicCHF, hx AFib, etc; on  ASA81, Coreg6.25Bid, Amlod5, Lotensin10, Lasix20-2/d, Lanoxin0.125-1/2 daily    Medical Issues> HBP, HL (Lip10), DM (Metform, Glimep), FTT/ physical deconditioning/ poor nutrition; prev followed by DrKCurly Rimow seeing DrPandy, PieJacksonr note of 08/04/16 is reviewed... EXAM shows Afeb, VSS, O2sat=95% on 2L O2;  76#, 5'3"Tall, BMI<15;  HEENT- neg, mallamapti2, tongue coated;  Chest- congested cough, decrBS, scat rhonchi, no w/r/consolidation;  Heart- RR w/o m/r/g;  Abd- thin, soft, neg;  Ext- w/o c/c/e...   CXR 07/26/16 (independently reviewed by me in the PACS system) showed cardiomeg, mild basilar scarring & lingular atx, scoliosis & DDD in Tspine...  CT Angio Chest 07/26/16>  Norm heart size, thor Ao atherosclerosis, NEG for PE; no adenopathy, +centrilob emphysema but otherw clear lungs, small effusions  2DEcho 07/29/16>  Diffuse HK, abn septal motion, reduced LVF w/ EF=40-45%, mild MR, aortic calcif, RVsys~23...  LABS 07/2016>  Chems- Na~132, HCO3~36, BS~269, A1c=6.9, Cr~0.77;  CBC- Hg~11-12, WBC~15=>12K IMP/PLAN>>  [name] MUST take  all meds as directed (needs admin from Family member care giver) and must commit to NO SMOKING;  We will check ONO in the interim;  Tongue coated- needs attn to oral hygiene, rx Nystatin;  Needs PT/ PT/ visiting nurses, etc... We plan ROV recheck 79mo..  ~  Sep 14, 2016:  140moOV & pulmonary follow up visit> Her PCP is listed as DrKoirala- ?when last seen?  She tells me that she is still NOT SMOKING but "I have urges";  She says her chest & breathing are ok-- denies cough, sput, SOB & CP;  She does have DOE eg w/ stairs at home, which she takes 1 step at a time, sometimes w/ help, & she feels as thopugh she is doing well...  I quizzed her about her meds-- she is NOT using the NEB (ran out of the Duoneb med & never refilled it);  On O2 at 2L/min flow 24/7, Symbicort160-2spBid, Incruse once daily, VentolinHFA prn; Flonase; it is hard to know what  she is really taking & we stressed the importance of no smoking & taking her meds every day as prescribed... we reviewed the ABOVE medical problems as well>>     EXAM shows Afeb, VSS, O2sat=95% on 2L O2;  76#, 5'3"Tall, BMI<15;  HEENT- neg, mallamapti2, tongue coated;  Chest- congested cough, decrBS, scat rhonchi, no w/r/consolidation;  Heart- RR w/o m/r/g;  Abd- thin, soft, neg;  Ext- w/o c/c/e...   LABS 09/14/16>  Chems- ok x HCO3=35, BS=141, (Cr=0.60 & LFTs wnl);  CBC- mild anemia w/ Hg=10.7;  TSH=0.87;  BNP=70...  Overnight oximetry done 08/18/16>  She only recorded for 2H;  O2sat nadir= 74%;  She spent 22 min w/ O2sat<88% IMP/PLAN>>  [name] commit to staying off the cigs;  For now we discussed using the Symbicort/ Incruse everyday & the VentolinHFA prn (she will leave off the NEBs as she has already stopped this on her own);  Must use her O2 24/7 and of course no smoke;  Medical issues per DrKoirala including- HBP, disatolicCHF, HxPAF, HL, DM, FTT/ physical deconditioning/ poor nutrition.  ~  November 21, 2016:  53m67moV & [name] reports that she has seen her PCP-Dr KoiDorthy Coolere do not have notes, he is not in epic, pt reports he started an Iron tab), and Cards-DrNishan> seen 09/22/16, note reviewed, Hx HBP, PAFib, DiastolicCHF, & underweight at 80lbs w/ BMI~15; he noted ongoing tobacco abuse;  Pt tells me she quit smoking 07/2016 Hosp;  She is holding NSR & not on Coumadin; her last 2DEcho in 2012 showed EF=60-65% and Gr1DD;  On ECASA81, Lotensin10, CorManchesterBid, Digoxin0.125-1/2tab, Lasix40=> he cut her dose to 34m61m..  Marland KitchenMarland Kitchene is also attended by [facility] telephone outreach 6/28 conversation w/ care giver (daughRamona MillSabra Heckdicated that "things are going great" and pt "hasn't had any problems, feeling good"; having met her goals- [facility] signed off... WE REVIEWED ABOVE PROB LIST...    EXAM shows Afeb, VSS, O2sat=95% on 2L O2;  89#, 5'3"Tall, BMI~16;  HEENT- neg, mallamapti2, tongue coated;  Chest-  congested cough, decrBS, scat rhonchi, no w/r/consolidation;  Heart- RR w/o m/r/g;  Abd- thin, soft, neg;  Ext- w/o c/c/e...  IMP/PLAN>>  Again [name] to do her NEB Tid followed by the Advair-2spBid & Incruse once daily;  DrNiCherly Hensenmanaging her PAF, Systolic & Diastolic CHF, HBP etc;  DrKoirala manages her HBP, DM, HL, Anemia etc...   ~  February 21, 2017:  3 month ROV & pulmonary recheck> [name]  reports Hartman overall, she has gained 2# to 90# today;  She notes some dry coughing, SOB is the same (eg-stairs in her split level home), her last cig was 07/2016 hosp;  She has home health aide & getting home PT... We reviewed the following medical problems during today's office visit>    Dyspnea is multifactorial-- due to COPD/emphysema, smoking, heart dis, deconditioning/ sedentary lifestyle/ 84 y/o => multidisciplinary approach supervised by PCP...    COPD/ emphysema-- on Symbicort160-2spBid, Incruse- once daily, & O2 24/7...    Ex-Cigarette smoker, prev smoking 3cig/d prior to 07/2016 [facility] & quit then, 65+pack-yr smoking hx    PulmHTN w/ 2DEcho in 2012 showing PAsys est~14mHg    CARDIAC Hx>  followed by DCherly Hensenfor HBP, sys+diastolicCHF, hx AFib, etc; on ASA81, Coreg6.25Bid, Amlod5, Lotensin10, Lasix20-2/d, Lanoxin0.125-1/2 daily    Medical Issues> HBP, HL (Lip10), DM (Metform, Glimep), FTT/ physical deconditioning/ poor nutrition; prev followed by DCurly Rim now seeing DrPandy, PGreeneher note of 08/04/16 is reviewed... EXAM shows Afeb, VSS, O2sat=97% on 2L O2;  90#, 5'3"Tall, BMI~16;  HEENT- neg, mallamapti2, tongue coated;  Chest- congested cough, decrBS, scat rhonchi, no w/r/consolidation;  Heart- RR w/o m/r/g;  Abd- thin, soft, neg;  Ext- w/o c/c/e...  IMP/PLAN>>  Continue Symbicort/ incruse & prn Albut rescue inhaler via aerochanber; need to stay off cigs and incr activity/ exercise...    ~  June 22, 2017:  447moOV & [name] reports feeling "fair",  notes she has to rest more at 851/o & feels more SOB;  There are no interval medical notes in Epic but BiTheodoraells me that her PCP DrKoirala did a wellness exam & referred her to ENT for hoarseness & had a lesion in her "voice box" but we do not have records and will call for these reports... She notes min cough/ sputum/ no hemoptysis, and she states that her SOB is actually improved... We reviewed the following medical problems during today's office visit>    Dyspnea is multifactorial-- due to COPD/emphysema, smoking, heart dis, deconditioning/ sedentary lifestyle/ 8550/o => multidisciplinary approach supervised by PCP...    COPD/ emphysema-- on Symbicort160-2spBid & Incruse- once daily, & O2 24/7...    Ex-Cigarette smoker, prev smoking 3cig/d prior to [facility] quit then, 65+pack-yr smoking hx    PulmHTN w/ 2DEcho in 2012 showing PAsys est~4378m    CARDIAC Hx>  followed by DrNCherly Hensenr HBP, sys+diastolicCHF, hx AFib, etc; on ASA81, Coreg6.25Bid, Amlod5, Lotensin10, Lasix20-2/d, Lanoxin0.125-1/2 daily    Medical Issues> HBP, HL (Lip10), DM (Metform, Glimep), FTT/ physical deconditioning/ poor nutrition; prev followed by DrKoirala-[facility]...  EXAM shows Afeb, VSS, O2sat=99% on 2L O2;  90#, 5'2"Tall, BMI~16;  HEENT- neg, mallamapti2, tongue coated;  Chest- congested cough, decrBS, scat rhonchi, no w/r/consolidation;  Heart- RR w/o m/r/g;  Abd- thin, soft, neg;  Ext- w/o c/c/e...  IMP/PLAN>>  [name] appears Hartman on her O2, Symbicort, Incruse; asked to stay current on indicated vaccinations thru her PCP office; asked to stay active in a regular exercise program; call for any breathing issues...     Past Medical History:  Diagnosis Date  . Acute bronchitis   . Acute on chronic respiratory failure with hypoxia ([facility] . Benign hypertensive renal disease   . CAD (coronary artery disease)   . Cardiomyopathy    non-ischemic 04/2008 EF 45% cardiac MRI no scar  . CHF (congestive heart  failure) ([place] [place] . Chronic sinusitis   . COPD (  chronic obstructive pulmonary disease) ([place])   . COPD exacerbation ([place])   . Depression   . Diabetes mellitus without complication, without long-term current use of insulin ([place])   . Dyslipidemia associated with type 2 diabetes mellitus ([place])   . Dyspnea on exertion    chronic  . Hypercholesterolemia   . Hypertension   . On home oxygen therapy    "3L; 24/7; for the last 2 1/2 weeks; none before that" (07/26/2016)  . Protein-calorie malnutrition, severe ([place])   . Sinus headache    "chronic"  . Stroke [facility]) 1997   "mild"  . Type II diabetes mellitus ([place])     Past Surgical History:  Procedure Laterality Date  . carotid angiogram     2006  . ESOPHAGOGASTRODUODENOSCOPY     with foreign body removal  . NASAL SINUS SURGERY  ~1974;~ 1976  . TONSILLECTOMY     "when I was a chld"    Outpatient Encounter Medications as of 06/22/2017  Medication Sig  . albuterol (VENTOLIN HFA) 108 (90 Base) MCG/ACT inhaler Inhale 2 puffs into the lungs every 6 (six) hours as needed for wheezing or shortness of breath.  Marland Kitchen aspirin EC 81 MG tablet Take 81 mg by mouth daily.  Marland Kitchen atorvastatin (LIPITOR) 10 MG tablet Take 1 tablet (10 mg total) by mouth daily at 6 PM.  . benazepril (LOTENSIN) 10 MG tablet Take 1 tablet (10 mg total) by mouth daily. (Patient taking differently: Take 10 mg by mouth 2 (two) times daily. )  . budesonide-formoterol (SYMBICORT) 160-4.5 MCG/ACT inhaler INHALE 2 PUFFS INTO THE LUNGS TWICE DAILY  . carvedilol (COREG) 6.25 MG tablet Take 1 tablet (6.25 mg total) by mouth 2 (two) times daily with a meal.  . DIGOX 125 MCG tablet TAKE 1/2 TABLET(0.063 MG) BY MOUTH DAILY  . ferrous sulfate 325 (65 FE) MG tablet Take 325 mg by mouth daily with breakfast.  . fluticasone (FLONASE) 50 MCG/ACT nasal spray Place 1 spray into both nostrils daily as needed for allergies.  . furosemide (LASIX) 20 MG tablet Take 1 tablet (20 mg total) by mouth daily.  .  insulin aspart (NOVOLOG) 100 UNIT/ML injection Inject into the skin daily as needed for high blood sugar (For CBG >300).  . metFORMIN (GLUCOPHAGE-XR) 500 MG 24 hr tablet Take 1,000 mg by mouth 2 (two) times daily.   . Multiple Vitamin (MV-ONE PO) Take 1 tablet by mouth daily.  Marland Kitchen Spacer/Aero-Holding Chambers (AEROCHAMBER MV) inhaler Use as instructed  . umeclidinium bromide (INCRUSE ELLIPTA) 62.5 MCG/INH AEPB Inhale 1 puff into the lungs daily.  Marland Kitchen UNABLE TO FIND Med Name: Med pass 120 mL by mouth 3 times daily  . [DISCONTINUED] budesonide-formoterol (SYMBICORT) 160-4.5 MCG/ACT inhaler Inhale 2 puffs into the lungs 2 (two) times daily.   No facility-administered encounter medications on file as of 06/22/2017.     No Known Allergies   Immunization History  Administered Date(s) Administered  . Influenza Split 01/23/2017  . Influenza,inj,Quad PF,6+ Mos 01/24/2015, 01/24/2016  . Pneumococcal-Unspecified 12/25/2014  NEED TO CHECK w/ DrSanders/ Koirala/ Pandy about her IMMUNIZ HISTORY...   Current Medications, Allergies, Past Medical History, Past Surgical History, Family History, and Social History were reviewed in [employer] record.   Review of Systems             All symptoms NEG except where BOLDED >>  Constitutional:  F/C/S, fatigue, anorexia, unexpected weight change. HEENT:  HA, visual changes, hearing loss, earache, nasal symptoms, sore throat,  mouth sores, hoarseness. Resp:  cough, sputum, hemoptysis; SOB, tightness, wheezing. Cardio:  CP, palpit, DOE, orthopnea, edema. GI:  N/V/D/C, blood in stool; reflux, abd pain, distention, gas. GU:  dysuria, freq, urgency, hematuria, flank pain, voiding difficulty. MS:  joint pain, swelling, tenderness, decr ROM; neck pain, back pain, etc. Neuro:  HA, tremors, seizures, dizziness, syncope, weakness, numbness, gait abn. Skin:  suspicious lesions or skin rash. Heme:  adenopathy, bruising, bleeding. Psyche:  confusion,  agitation, sleep disturbance, hallucinations, anxiety, depression suicidal.   Objective:   Physical Exam       Vital Signs:  Reviewed...   General:  WD, underweight, 84 y/o BF in NAD; alert & oriented; pleasant & cooperative... HEENT:  [place]/AT; Conjunctiva- pink, Sclera- nonicteric, EOM-wnl, PERRLA, Fundi-benign; EACs-clear, TMs-wnl; NOSE-sl red; THROAT-clear & wnl.  Neck:  Supple w/ decr ROM; no JVD; normal carotid impulses w/o bruits; no thyromegaly or nodules palpated; no lymphadenopathy.  Chest:  DecrBS, clear without wheezes, rales, or rhonchi heard. Heart:  Regular Rhythm; norm S1 & S2 without murmurs, rubs, or gallops detected. Abdomen:  Soft & nontender- no guarding or rebound; normal bowel sounds; no organomegaly or masses palpated. Ext:  decrROM; without deformities +arthritic changes; no varicose veins, +venous insuffic, no edema;  Pulses intact w/o bruits. Neuro:  No focal neuro deficits Derm:  No lesions noted; no rash etc. Lymph:  No cervical, supraclavicular, axillary, or inguinal adenopathy palpated.   Assessment:      IMP>>    Dyspnea is multifactorial-- due to COPD/emphysema, smoking, heart dis, deconditioning/ sedentary lifestyle/ 84 y/o...    COPD/ emphysema    Cigarette smoker, still smoking 3cig/d, 65+pack-yr smoking hx    PulmHTN w/ 2DEcho in 2012 showing PAsys est~52mHg    CARDIAC Hx>  followed by DCherly Hensenfor HBP, diastolicCHF, hx AFib    MEDICAL Hx>  follwed by PCP for HBP, HL, DM, FTT/ underwt, etc...  PLAN>> 05/27/15>   We reviewed her objective data and the Dx of COPD/emphysema (GOLD Stage3); she understands that she must quit all smoking, and rec to start regular inhaler use-- SYMBICORT160-2spBid, INCRUSE-once daily, +OTC MUCINEX'600mg'$  1-2Bid, +nutritional supplements daily;  She needs regular f/u w/ her PCP to work on med compliance, nutritional support, etc... ROV recheck in 6wks. 07/21/15>   She is again advised to quit all smoking! We reviewed med regimen  w/ Symbicort160, Incruse, Mucinex, AlbutHFA rescue inhaler, etc;  Reminded to incr exercise program, walk regularly, eat better and gain some weight... 08/15/16>   [name] MUST take all meds as directed (needs admin from Family member care giver) and must commit to NO SMOKING;  We will check ONO in the interim;  Tongue coated- needs attn to oral hygiene, rx Nystatin;  Needs PT/ PT/ visiting nurses, etc... We plan ROV recheck 138mo. 09/15/26>   [name] must commit to staying off the cigs;  For now we discussed using the Symbicort/ Incruse everyday & the VentolinHFA prn (she will leave off the NEBs as she has already stopped this on her own);  Must use her O2 24/7 and of course no smoke;  Medical issues per DrKoirala including- HBP, disatolicCHF, HxPAF, HL, DM, FTT/ physical deconditioning/ poor nutrition 11/21/16>   Again BiVernestines reminded to do her NEB Tid followed by the Advair-2spBid & Incruse once daily;  DrCherly Hensens managing her PAF, Systolic & Diastolic CHF, HBP etc;  DrKoirala manages her HBP, DM, HL, Anemia etc 02/21/17>  Continue Symbicort/ incruse & prn Albut rescue inhaler via aerochanber; need to  stay off cigs and incr activity/ exercise. 06/22/17>   [name] on her O2, Symbicort, Incruse; asked to stay current on indicated vaccinations thru her PCP office; asked to stay active in a regular exercise program; call for any breathing issues...    Plan:     Patient's Medications  New Prescriptions   No medications on file  Previous Medications   ALBUTEROL (VENTOLIN HFA) 108 (90 BASE) MCG/ACT INHALER    Inhale 2 puffs into the lungs every 6 (six) hours as needed for wheezing or shortness of breath.   ASPIRIN EC 81 MG TABLET    Take 81 mg by mouth daily.   ATORVASTATIN (LIPITOR) 10 MG TABLET    Take 1 tablet (10 mg total) by mouth daily at 6 PM.   BENAZEPRIL (LOTENSIN) 10 MG TABLET    Take 1 tablet (10 mg total) by mouth daily.   BUDESONIDE-FORMOTEROL (SYMBICORT) 160-4.5 MCG/ACT  INHALER    INHALE 2 PUFFS INTO THE LUNGS TWICE DAILY   CARVEDILOL (COREG) 6.25 MG TABLET    Take 1 tablet (6.25 mg total) by mouth 2 (two) times daily with a meal.   DIGOX 125 MCG TABLET    TAKE 1/2 TABLET(0.063 MG) BY MOUTH DAILY   FERROUS SULFATE 325 (65 FE) MG TABLET    Take 325 mg by mouth daily with breakfast.   FLUTICASONE (FLONASE) 50 MCG/ACT NASAL SPRAY    Place 1 spray into both nostrils daily as needed for allergies.   FUROSEMIDE (LASIX) 20 MG TABLET    Take 1 tablet (20 mg total) by mouth daily.   INSULIN ASPART (NOVOLOG) 100 UNIT/ML INJECTION    Inject into the skin daily as needed for high blood sugar (For CBG >300).   METFORMIN (GLUCOPHAGE-XR) 500 MG 24 HR TABLET    Take 1,000 mg by mouth 2 (two) times daily.    MULTIPLE VITAMIN (MV-ONE PO)    Take 1 tablet by mouth daily.   SPACER/AERO-HOLDING CHAMBERS (AEROCHAMBER MV) INHALER    Use as instructed   UMECLIDINIUM BROMIDE (INCRUSE ELLIPTA) 62.5 MCG/INH AEPB    Inhale 1 puff into the lungs daily.   UNABLE TO FIND    Med Name: Med pass 120 mL by mouth 3 times daily  Modified Medications   No medications on file  Discontinued Medications   BUDESONIDE-FORMOTEROL (SYMBICORT) 160-4.5 MCG/ACT INHALER    Inhale 2 puffs into the lungs 2 (two) times daily.

## 2017-06-26 ENCOUNTER — Other Ambulatory Visit: Payer: Self-pay | Admitting: Nurse Practitioner

## 2017-07-27 ENCOUNTER — Other Ambulatory Visit: Payer: Self-pay | Admitting: Pulmonary Disease

## 2017-07-27 ENCOUNTER — Ambulatory Visit: Payer: Medicare Other | Attending: Otolaryngology | Admitting: Speech Pathology

## 2017-07-27 ENCOUNTER — Encounter: Payer: Self-pay | Admitting: Speech Pathology

## 2017-07-27 DIAGNOSIS — R498 Other voice and resonance disorders: Secondary | ICD-10-CM | POA: Insufficient documentation

## 2017-07-27 NOTE — Patient Instructions (Signed)
VOCAL FOLD ADDUCTION EXERCISES  Breathe before each excercise  Yawn-Sigh  Take an easy breath through your mouth while yawning gently. When you breathe out, say an easy, prolonged "AH" 10x, 3x a day  1.  PUSH Bear down against a chair while saying /a/ 5 times, with a lot of effort  2.  PULL up of the seat of a chair with both hands while saying /a/ 5 times with a lot of   Effort  3. Take a breath, hold it for 3 seconds with your mouth open, then release it with a loud exhale. Think of an army or football "HUH." You are using your voice box to hold in your breath.  4. Glide your pitch high and low (as low and high as possible) "Knoll"  5. Glide your pitch low to high "Knoll"  6. Gentle cough/throat clear, then continue voicing for 3-5 seconds  7. Hold your breath, swallow hard, then immediately cough   Do these 10 times each, 3 times a day   _______________________________________________________________________________________________________________  Your signs and symptoms may be consistent with esophageal dysphagia. Only your doctor can diagnose esophageal dysphagia, please discuss with your physician.  What is reflux? Gastroesophageal Reflux Disease (GERD) commonly referred to as reflux, is a backflow of acid from the stomach into the swallowing tube or esophagus.  Some reflux is normal, but when it happens frequently, the acid can irritate and damage the lining on the inside of the esophagus.  The most common symptom is heartburn.  Laryngopharyngeal Reflux (LPR) is when the acid backflow reaches the throat.  The structures of the throat (pharynx, larynx) are much more sensitive to stomach acid, so there is increased risk of damage.  People with LPR often do not experience heartburn.  The more common symptoms of LPR include: hoarseness, chronic cough, frequent throat clearing, feeling a lump in the throat and problems swallowing.  There are many changes you can make in diet,  positioning and in your lifestyle that can have a dramatic effect in preventing or stopping reflux.  They include:  Everyday: . Avoid tight or constricting clothing . Avoid smoking, or exposing yourself to second hand smoke . Avoid non-steroidal anti-inflammatory drugs (ibuprofen, Alleve) . Exercise regularly, reduce stress . Lose weight   Avoiding or limiting certain foods: . Spicy, acidic (tomato-based foods, citrus or vinegar-based foods) . Fruit juices such as orange, grapefruit or cranberry . Fried foods . Caffeine (such as coffee, tea, sodas)   . Carbonated beverages  . Chocolate  . Peppermint . Alcohol . Decrease Dairy . Decrease red meat . Any food that gives you symptoms               During and after meals: . Eat slowly and don't overeat at meals . Eat several smaller meals a day, rather than larger ones . Do not lie down for at least  hour- 1 hour after meals . Avoid bending over or exercising after eating . Chewing gum (non-mint) for 20 minutes after each meal may be helpful . Drinking warm fluids with meals (i.e. warm decaffeinated tea) may help clear the esophagus better  Bedtime: . Avoid eating or drinking within 2-3 hours before bedtime, except for water . Elevate the head of the bed 6-8 inches with blocks, books, or wedge under your mattress (propping  yourself up on pillows may cause neck or back pain) . If you take medications at night, be sure to take them with a full glass of water

## 2017-07-28 NOTE — Therapy (Signed)
[facility] [address] [place], [place], [address] Phone: [phone]   Fax:  [phone]  Speech Language Pathology Evaluation  Patient Details  Name: [name] MRN: [id number] Date of Birth: Aug 06, 1929 Referring Provider: Dr. Doran Heater   Encounter Date: 07/27/2017  End of Session - 07/27/17 1102    Visit Number  1    Number of Visits  17    Date for SLP Re-Evaluation  09/29/17    SLP Start Time  1102    SLP Stop Time   1145    SLP Time Calculation (min)  43 min    Activity Tolerance  Patient tolerated treatment well       Past Medical History:  Diagnosis Date  . Acute bronchitis   . Acute on chronic respiratory failure with hypoxia (HCC)   . Benign hypertensive renal disease   . CAD (coronary artery disease)   . Cardiomyopathy    non-ischemic 04/2008 EF 45% cardiac MRI no scar  . CHF (congestive heart failure) (HCC)   . Chronic sinusitis   . COPD (chronic obstructive pulmonary disease) (HCC)   . COPD exacerbation (HCC)   . Depression   . Diabetes mellitus without complication, without long-term current use of insulin (HCC)   . Dyslipidemia associated with type 2 diabetes mellitus (HCC)   . Dyspnea on exertion    chronic  . Hypercholesterolemia   . Hypertension   . On home oxygen therapy    "3L; 24/7; for the last 2 1/2 weeks; none before that" (07/26/2016)  . Protein-calorie malnutrition, severe (HCC)   . Sinus headache    "chronic"  . Stroke [facility]) 1997   "mild"  . Type II diabetes mellitus (HCC)     Past Surgical History:  Procedure Laterality Date  . carotid angiogram     2006  . ESOPHAGOGASTRODUODENOSCOPY     with foreign body removal  . NASAL SINUS SURGERY  ~1974;~ 1976  . TONSILLECTOMY     "when I was a chld"    There were no vitals filed for this visit.  Subjective Assessment - 07/27/17 1105    Subjective  "I'm not talking as clear as I usually do."    Patient is accompained  by:  Family member daughter    Currently in Pain?  No/denies         SLP Evaluation [facility] - 07/27/17 1105      SLP Visit Information   SLP Received On  07/27/17    Referring Provider  Dr. 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 [name] is a 84 y.o. female referred by Dr. Doran Heater for evaluation of dysphonia, age-related vocal fold atrophy. PMH noted for CVA (1997), DM, COPD, CHF. Laryngoscopy 05/30/17 findings: "The vocal folds are mobile bilaterally. There is mostly adequate glottal closure the exception of on some words, the patient will demonstrate a very mild persistent glottic gap. There are no lesions or significant inflammation. There is no glottal insufficiency. There is no pooling of secretions or aspiration. There is post cricoid edema and erythema consistent with acid reflux."    Behavioral/Cognition  alert, cooperative    Mobility Status  ambulated to session  Balance Screen   Has the patient fallen in the past 6 months  No      Prior Functional Status   Cognitive/Linguistic Baseline  Baseline deficits Suspect baseline deficits      Cognition   Overall Cognitive Status  History of cognitive impairments - at baseline not formally assessed      Auditory Comprehension   Overall Auditory Comprehension  Appears within functional limits for tasks assessed      Verbal Expression   Overall Verbal Expression  Appears within functional limits for tasks assessed      Written Expression   Dominant Hand  Right    Written Expression  Not tested      Oral Motor/Sensory Function   Overall Oral Motor/Sensory Function  Appears within functional limits for tasks assessed    Labial ROM  Within Functional Limits    Labial Symmetry  Within Functional Limits    Labial  Strength  Within Functional Limits    Lingual ROM  Within Functional Limits    Lingual Symmetry  Within Functional Limits    Lingual Strength  Within Functional Limits    Facial ROM  Within Functional Limits    Facial Symmetry  Within Functional Limits    Facial Strength  Within Functional Limits    Velum  Within Functional Limits    Mandible  Within Functional Limits      Motor Speech   Overall Motor Speech  Impaired    Respiration  Impaired    Level of Impairment  Phrase    Phonation  Hoarse;Other (comment) voice breaks    Resonance  Within functional limits    Articulation  Within functional limitis    Intelligibility  Intelligible    Motor Planning  Witnin functional limits    Motor Speech Errors  Not applicable    Effective Techniques  Increased vocal intensity    Phonation  Impaired    Volume  Decibel Level range 62-75 in conversation    Pitch  Low      Standardized Assessments   Standardized Assessments   Other Assessment    Other Assessment  Sustained /a/ phonation 7.5 seconds duration at 80 dB with frequent pitch breaks. Unable to measure s:z ratio as pt was cognitively unable to follow commands for this task despite max cues. Paragraph reading averaged 65 dB; vocal quality hoarse, gravelly with frequent pitch breaks. Began VRQOL however did not continue administering this assessment as it was felt to be an unreliable measure of pt's voice problem. Pt answered "no" to every question (unable to rate score on a scale) however with cues from daughter pt acknowledged that she does lose her voice when talking on the phone, feel her voice is unpredictable, need to repeat herself frequently, run out of air when speaking and lose her voice when speaking for longer periods. Pt is former smoker (60+ years), who quit approximately one year ago after hospitalization. She drinks approximately 18-24 oz water per day, 8 oz caffeine. Denies dysphagia initially though does report occasional  coughing with liquids when taking larger sips; she takes small sips to avoid this. No recent weight loss (pt has steadily gained weight since her hospitalization) or PNA. She is on 2L O2 via [place] for severe COPD; shallow breathing noted.                      SLP Education - 07/27/17 1111    Education provided  Yes    Education  Details  GERD behavioral modifications, vocal fold adduction exercises    Person(s) Educated  Patient;Child(ren)    Methods  Explanation       SLP Short Term Goals - 07/28/17 0820      SLP SHORT TERM GOAL #1   Title  Pt will complete HEP for vocal fold adduction with occasional min A over 3 sessions.    Time  4    Period  Weeks    Status  New      SLP SHORT TERM GOAL #2   Title  pt will respond with at least 20 sentences average low-70s dB over three sessions    Time  4    Period  Weeks    Status  New      SLP SHORT TERM GOAL #3   Title  pt will demo WFL vocal quality, endurance in >80% of 5 minutes simple conversation    Time  4    Period  Weeks    Status  New       SLP Long Term Goals - 07/27/17 1102      SLP LONG TERM GOAL #1   Title  pt will demo HEP for vocal fold adduction with modified independence over 3 sessions     Time  8    Period  Weeks    Status  New      SLP LONG TERM GOAL #2   Title  pt will talk in 15 min simple conversation outside ST room with [facility] loudness and vocal quality over three sessions    Time  8    Period  Weeks    Status  New      SLP LONG TERM GOAL #3   Title  Pt/daughter will report improved vocal quality and endurance outside ST room over 3 visits    Time  8    Period  Weeks    Status  New       Plan - 07/27/17 1102    Clinical Impression Statement  Patient presents with moderate dysphonia due to 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 Voice is characterized by hoarse, gravelly quality with pitch breaks. She reports vocal fatigue in conversations and on the phone, losing her voice  when speaking for longer periods, difficulty talking on the phone and running out of air when speaking. Vocal intensity is mildly reduced although pt is 100% intelligible in quiet environment. Cognition not formally assessed this date, although SLP noted pt had difficulty following complex commands; most successful interventions were demonstration-based or cued for single parameter (amplitude). I recommend skilled ST to address vocal fold adduction in order to maximize vocal quality and endurance to improve communication and quality of life.     Speech Therapy Frequency  2x / week    Duration  -- 8 weeks or 16 additional visits    Treatment/Interventions  Aspiration precaution training;Functional tasks;SLP instruction and feedback;Patient/family education;Other (comment) amplitude-based voice therapy, vocal fold adduction exercises    Potential to Achieve Goals  Good    Potential Considerations  Ability to learn/carryover information    SLP Home Exercise Plan  pushing/pulling phonation exercises; will continue training VF adduction exercises    Consulted and Agree with Plan of Care  Patient;Family member/caregiver       Patient will benefit from skilled therapeutic intervention in order to improve the following deficits and impairments:   Other voice and resonance disorders    Problem List Patient Active Problem List  Diagnosis Date Noted  . COPD mixed type (HCC) 08/15/2016  . Weakness 08/15/2016  . Protein-calorie malnutrition, severe 07/28/2016  . Pressure injury of skin 07/27/2016  . Chronic obstructive pulmonary disease (HCC) 07/26/2016  . Acute on chronic respiratory failure with hypoxia (HCC) 07/26/2016  . Essential hypertension 07/26/2016  . Dyslipidemia associated with type 2 diabetes mellitus (HCC) 07/26/2016  . Diabetes mellitus without complication, without long-term current use of insulin (HCC) 07/26/2016  . Acute bronchitis 07/26/2016  . Underweight 05/27/2015  . Chronic  diastolic heart failure (HCC) 11/24/2011   Rondel Baton, MS, CCC-SLP Speech-Language Pathologist   Arlana Lindau 07/28/2017, 8:33 AM  [facility] [address] [place], [place], [address] Phone: [phone]   Fax:  [phone]  Name: LAVETTA SHELBURN MRN: [id number] Date of Birth: 01-01-1930

## 2017-08-02 ENCOUNTER — Ambulatory Visit: Payer: Medicare Other

## 2017-08-02 DIAGNOSIS — R498 Other voice and resonance disorders: Secondary | ICD-10-CM | POA: Diagnosis not present

## 2017-08-02 NOTE — Therapy (Signed)
[facility] [address] [place], [place], [address] Phone: [phone]   Fax:  [phone]  Speech Language Pathology Treatment  Patient Details  Name: [name] MRN: [id number] Date of Birth: 16-Jun-1929 Referring Provider: Dr. Doran Heater   Encounter Date: 08/02/2017  End of Session - 08/02/17 1717    Visit Number  2    Number of Visits  17    Date for SLP Re-Evaluation  09/29/17    SLP Start Time  1405    SLP Stop Time   1445    SLP Time Calculation (min)  40 min    Activity Tolerance  Patient tolerated treatment well       Past Medical History:  Diagnosis Date  . Acute bronchitis   . Acute on chronic respiratory failure with hypoxia (HCC)   . Benign hypertensive renal disease   . CAD (coronary artery disease)   . Cardiomyopathy    non-ischemic 04/2008 EF 45% cardiac MRI no scar  . CHF (congestive heart failure) (HCC)   . Chronic sinusitis   . COPD (chronic obstructive pulmonary disease) (HCC)   . COPD exacerbation (HCC)   . Depression   . Diabetes mellitus without complication, without long-term current use of insulin (HCC)   . Dyslipidemia associated with type 2 diabetes mellitus (HCC)   . Dyspnea on exertion    chronic  . Hypercholesterolemia   . Hypertension   . On home oxygen therapy    "3L; 24/7; for the last 2 1/2 weeks; none before that" (07/26/2016)  . Protein-calorie malnutrition, severe (HCC)   . Sinus headache    "chronic"  . Stroke [facility]) 1997   "mild"  . Type II diabetes mellitus (HCC)     Past Surgical History:  Procedure Laterality Date  . carotid angiogram     2006  . ESOPHAGOGASTRODUODENOSCOPY     with foreign body removal  . NASAL SINUS SURGERY  ~1974;~ 1976  . TONSILLECTOMY     "when I was a chld"    There were no vitals filed for this visit.  Subjective Assessment - 08/02/17 1417    Subjective  Pt entered with daughter today.     Patient is accompained by:  Family  member daughter    Currently in Pain?  No/denies            ADULT SLP TREATMENT - 08/02/17 1418      General Information   Behavior/Cognition  Alert;Cooperative;Pleasant mood;Requires cueing      Treatment Provided   Treatment provided  Cognitive-Linquistic      Cognitive-Linquistic Treatment   Treatment focused on  Voice    Skilled Treatment  Noted pt asks the same question multiple times during session today indicative of memory deficits. SLP guided pt through her HEP for voice (first three exercises) with mod-max cues from SLP consistently. SLP told daugher to cue different sequences with the push and pull exercises and see which assists pt the best, with getting a full breath. Pt with vocal fry at the end of yawn-sigh; min cues to remediate to a production with WNL voicing. SLP educated pt and daughter re: GERD triggers - pt having ginger ale for dinner meal and in bed ~2 hours later.  SLP recommended having ginger ale as a "snack" or a "cocktail" prior to dinner. Daughter is going to give pt ginger ale for lunch instaed of evening meal. SLP told pt's daughter that pt might need frequent cueing for stronger/louder  speech.        Assessment / Recommendations / Plan   Plan  Continue with current plan of care      Progression Toward Goals   Progression toward goals  Progressing toward goals       SLP Education - 08/02/17 1716    Education provided  Yes    Education Details  pt may need assistance with HEP due to memory deficits    Person(s) Educated  Patient    Methods  Explanation    Comprehension  Verbalized understanding       SLP Short Term Goals - 08/02/17 1723      SLP SHORT TERM GOAL #1   Title  Pt will complete HEP for vocal fold adduction with occasional min A over 3 sessions.    Time  4    Period  Weeks    Status  On-going      SLP SHORT TERM GOAL #2   Title  pt will respond with at least 20 sentences average low-70s dB over three sessions    Time  4     Period  Weeks    Status  On-going      SLP SHORT TERM GOAL #3   Title  pt will demo WFL vocal quality, endurance in >80% of 5 minutes simple conversation    Time  4    Period  Weeks    Status  On-going       SLP Long Term Goals - 08/02/17 1723      SLP LONG TERM GOAL #1   Title  pt will demo HEP for vocal fold adduction with modified independence over 3 sessions     Time  8    Period  Weeks    Status  On-going      SLP LONG TERM GOAL #2   Title  pt will talk in 15 min simple conversation outside ST room with [facility] loudness and vocal quality over three sessions    Time  8    Period  Weeks    Status  On-going      SLP LONG TERM GOAL #3   Title  Pt/daughter will report improved vocal quality and endurance outside ST room over 3 visits    Time  8    Period  Weeks    Status  On-going       Plan - 08/02/17 1717    Clinical Impression Statement  Pt voice is characterized by hoarse, gravelly quality with pitch breaks. ger periods, Vocal intensity remained mildly reduced and pt was 100% intelligible in quiet environment. Pt asked daughter the same question within 4 minutes during the session. SLP told pt daughter that she may need to perform HEP with pt due to pt's memory deficits. I recommend skilled ST to address vocal fold adduction in order to maximize vocal quality and endurance to improve communication and quality of life.     Speech Therapy Frequency  2x / week    Duration  -- 8 weeks/16 addtional visits    Treatment/Interventions  Aspiration precaution training;Functional tasks;SLP instruction and feedback;Patient/family education;Other (comment)    Potential to Achieve Goals  Good    Potential Considerations  Ability to learn/carryover information       Patient will benefit from skilled therapeutic intervention in order to improve the following deficits and impairments:   Other voice and resonance disorders    Problem List Patient Active Problem List   Diagnosis Date  Noted  .  COPD mixed type (HCC) 08/15/2016  . Weakness 08/15/2016  . Protein-calorie malnutrition, severe 07/28/2016  . Pressure injury of skin 07/27/2016  . Chronic obstructive pulmonary disease (HCC) 07/26/2016  . Acute on chronic respiratory failure with hypoxia (HCC) 07/26/2016  . Essential hypertension 07/26/2016  . Dyslipidemia associated with type 2 diabetes mellitus (HCC) 07/26/2016  . Diabetes mellitus without complication, without long-term current use of insulin (HCC) 07/26/2016  . Acute bronchitis 07/26/2016  . Underweight 05/27/2015  . Chronic diastolic heart failure (HCC) 11/24/2011    Jalyn Rosero ,MS, CCC-SLP  08/02/2017, 5:25 PM  [facility] [address] [place], [place], [address] Phone: [phone]   Fax:  [phone]   Name: [name] MRN: [id number] Date of Birth: 05-14-1929

## 2017-08-09 ENCOUNTER — Ambulatory Visit: Payer: Medicare Other | Admitting: Speech Pathology

## 2017-08-09 DIAGNOSIS — R498 Other voice and resonance disorders: Secondary | ICD-10-CM

## 2017-08-09 NOTE — Patient Instructions (Signed)
Home exercises, 2-3x per day  1)  Breathe in, then [facility] Bear down against a chair while saying /a/ 5 times, with a lot of effort. Don't hold your breath.  2)  Breathe in, then PULL up of the seat of a chair with both hands while saying /a/ 5 times with a lot of effort.  Don't hold your breath.    3) Read aloud from your list of 10 phrases in your loud, good quality voice.   1. Can you turn the TV on?  2. [place], get out!  3. What am I putting on today?  4.   5.   6.  7.  8.  9.  10.  4) Read out LOUD using additional tasks provided by your therapist (handouts)

## 2017-08-09 NOTE — Therapy (Signed)
[facility] [address] [place], [place], [address] Phone: [phone]   Fax:  [phone]  Speech Language Pathology Treatment  Patient Details  Name: [name] MRN: [id number] Date of Birth: Apr 08, 1930 Referring Provider: Dr. Doran Heater   Encounter Date: 08/09/2017  End of Session - 08/09/17 1753    Visit Number  3    Number of Visits  17    Date for SLP Re-Evaluation  09/29/17    SLP Start Time  1532    SLP Stop Time   1614    SLP Time Calculation (min)  42 min       Past Medical History:  Diagnosis Date  . Acute bronchitis   . Acute on chronic respiratory failure with hypoxia (HCC)   . Benign hypertensive renal disease   . CAD (coronary artery disease)   . Cardiomyopathy    non-ischemic 04/2008 EF 45% cardiac MRI no scar  . CHF (congestive heart failure) (HCC)   . Chronic sinusitis   . COPD (chronic obstructive pulmonary disease) (HCC)   . COPD exacerbation (HCC)   . Depression   . Diabetes mellitus without complication, without long-term current use of insulin (HCC)   . Dyslipidemia associated with type 2 diabetes mellitus (HCC)   . Dyspnea on exertion    chronic  . Hypercholesterolemia   . Hypertension   . On home oxygen therapy    "3L; 24/7; for the last 2 1/2 weeks; none before that" (07/26/2016)  . Protein-calorie malnutrition, severe (HCC)   . Sinus headache    "chronic"  . Stroke [facility]) 1997   "mild"  . Type II diabetes mellitus (HCC)     Past Surgical History:  Procedure Laterality Date  . carotid angiogram     2006  . ESOPHAGOGASTRODUODENOSCOPY     with foreign body removal  . NASAL SINUS SURGERY  ~1974;~ 1976  . TONSILLECTOMY     "when I was a chld"    There were no vitals filed for this visit.  Subjective Assessment - 08/09/17 1535    Subjective  "I'm still having trouble with my voice box or something."    Patient is accompained by:  Family member daughter    Currently  in Pain?  No/denies            ADULT SLP TREATMENT - 08/09/17 1536      General Information   Behavior/Cognition  Alert;Cooperative;Pleasant mood;Requires cueing    Patient Positioning  Upright in bed      Treatment Provided   Treatment provided  Cognitive-Linquistic      Pain Assessment   Pain Assessment  No/denies pain      Cognitive-Linquistic Treatment   Treatment focused on  Voice    Skilled Treatment  Pt arrived with daughter, who reports assisting pt with practicing exercises at home 2-3 times daily with mixed success. Pt required mod-max cues from SLP for push/pull exercises from HEP; unable to perform yawn-sigh accurately, even with max cues there is strained production. SLP modified pt's HEP to include only push/pull exercises; daughter demo'd appropriate cuing for inhalation. SLP modified treatment approach to minimize cognitive load, using amplitude-based interventions. Pt imitated short functional phrases with average dB in low-mid 70s; occasional min-mod A required for loudness and usual mod-max A for respiration/phonation coordination. Ultimately most successful strategy was cuing for loudness vs breathing; daughter to cue loudness in speech tasks provided for home practice. HEP updated and pt/daughter to come up with  10 everyday phrases to practice at home. See pt instructions.      Assessment / Recommendations / Plan   Plan  Continue with current plan of care      Progression Toward Goals   Progression toward goals  Progressing toward goals       SLP Education - 08/09/17 1753    Education provided  Yes    Education Details  focus on loudness vs breathing in speech tasks    Person(s) Educated  Patient;Child(ren)    Methods  Explanation;Demonstration;Verbal cues;Handout    Comprehension  Verbalized understanding;Returned demonstration;Verbal cues required;Need further instruction       SLP Short Term Goals - 08/09/17 1756      SLP SHORT TERM GOAL #1   Title   Pt will complete HEP for vocal fold adduction with usual min A over 3 sessions.    Time  3    Period  Weeks    Status  Revised      SLP SHORT TERM GOAL #2   Title  pt will respond with at least 20 sentences average low-70s dB over three sessions    Time  3    Period  Weeks    Status  On-going      SLP SHORT TERM GOAL #3   Title  pt will demo WFL vocal quality, endurance in >80% of 5 minutes simple conversation    Time  3    Period  Weeks    Status  On-going       SLP Long Term Goals - 08/09/17 1757      SLP LONG TERM GOAL #1   Title  pt will demo HEP for vocal fold adduction with occasional min A over 3 sessions     Time  7    Period  Weeks    Status  Revised      SLP LONG TERM GOAL #2   Title  pt will talk in 15 min simple conversation outside ST room with [facility] loudness and vocal quality over three sessions    Time  7    Period  Weeks    Status  On-going      SLP LONG TERM GOAL #3   Title  Pt/daughter will report improved vocal quality and endurance outside ST room over 3 visits    Time  7    Period  Weeks    Status  On-going       Plan - 08/09/17 1754    Clinical Impression Statement  Pt voice is characterized by hoarse, gravelly quality with pitch breaks. Vocal intensity remained mildly reduced and pt was 100% intelligible in quiet environment. Amplitude-based interventions were successful in improving vocal quality. HEP modified to reduce cognitive load; daughter to continue to assist pt due to pt's memory deficits. I recommend skilled ST to address vocal fold adduction in order to maximize vocal quality and endurance to improve communication and quality of life.     Speech Therapy Frequency  2x / week    Treatment/Interventions  Aspiration precaution training;Functional tasks;SLP instruction and feedback;Patient/family education;Other (comment)    Potential to Achieve Goals  Good    Potential Considerations  Ability to learn/carryover information    SLP Home  Exercise Plan  pushing/pulling phonation exercises; will continue training VF adduction exercises    Consulted and Agree with Plan of Care  Patient;Family member/caregiver       Patient will benefit from skilled therapeutic intervention in order to improve the  following deficits and impairments:   Other voice and resonance disorders    Problem List Patient Active Problem List   Diagnosis Date Noted  . COPD mixed type (HCC) 08/15/2016  . Weakness 08/15/2016  . Protein-calorie malnutrition, severe 07/28/2016  . Pressure injury of skin 07/27/2016  . Chronic obstructive pulmonary disease (HCC) 07/26/2016  . Acute on chronic respiratory failure with hypoxia (HCC) 07/26/2016  . Essential hypertension 07/26/2016  . Dyslipidemia associated with type 2 diabetes mellitus (HCC) 07/26/2016  . Diabetes mellitus without complication, without long-term current use of insulin (HCC) 07/26/2016  . Acute bronchitis 07/26/2016  . Underweight 05/27/2015  . Chronic diastolic heart failure (HCC) 11/24/2011   Rondel Baton, MS, CCC-SLP Speech-Language Pathologist   Arlana Lindau 08/09/2017, 5:58 PM  [facility] [address] [place], [place], [address] Phone: [phone]   Fax:  [phone]   Name: [name] MRN: [id number] Date of Birth: 1929-08-20

## 2017-08-15 ENCOUNTER — Ambulatory Visit: Payer: Medicare Other

## 2017-08-15 DIAGNOSIS — R498 Other voice and resonance disorders: Secondary | ICD-10-CM | POA: Diagnosis not present

## 2017-08-15 NOTE — Therapy (Signed)
[facility] [address] [place], [place], [address] Phone: [phone]   Fax:  [phone]  Speech Language Pathology Treatment  Patient Details  Name: [name] MRN: [id number] Date of Birth: 1929/06/29 Referring Provider: Dr. Doran Heater   Encounter Date: 08/15/2017  End of Session - 08/15/17 1653    Visit Number  4    Number of Visits  17    Date for SLP Re-Evaluation  09/29/17    SLP Start Time  1533    SLP Stop Time   1615    SLP Time Calculation (min)  42 min    Activity Tolerance  Patient tolerated treatment well       Past Medical History:  Diagnosis Date  . Acute bronchitis   . Acute on chronic respiratory failure with hypoxia (HCC)   . Benign hypertensive renal disease   . CAD (coronary artery disease)   . Cardiomyopathy    non-ischemic 04/2008 EF 45% cardiac MRI no scar  . CHF (congestive heart failure) (HCC)   . Chronic sinusitis   . COPD (chronic obstructive pulmonary disease) (HCC)   . COPD exacerbation (HCC)   . Depression   . Diabetes mellitus without complication, without long-term current use of insulin (HCC)   . Dyslipidemia associated with type 2 diabetes mellitus (HCC)   . Dyspnea on exertion    chronic  . Hypercholesterolemia   . Hypertension   . On home oxygen therapy    "3L; 24/7; for the last 2 1/2 weeks; none before that" (07/26/2016)  . Protein-calorie malnutrition, severe (HCC)   . Sinus headache    "chronic"  . Stroke [facility]) 1997   "mild"  . Type II diabetes mellitus (HCC)     Past Surgical History:  Procedure Laterality Date  . carotid angiogram     2006  . ESOPHAGOGASTRODUODENOSCOPY     with foreign body removal  . NASAL SINUS SURGERY  ~1974;~ 1976  . TONSILLECTOMY     "when I was a chld"    There were no vitals filed for this visit.  Subjective Assessment - 08/15/17 1536    Subjective  Pt's daughter reports pt has done one set of HEP today already.    Patient is accompained by:  Family member daughter    Currently in Pain?  No/denies            ADULT SLP TREATMENT - 08/15/17 1543      General Information   Behavior/Cognition  Alert;Cooperative;Pleasant mood;Requires cueing      Treatment Provided   Treatment provided  Cognitive-Linquistic      Cognitive-Linquistic Treatment   Treatment focused on  Voice    Skilled Treatment  Pt's daughter reports continuing to assist pt with HEP - twice a day mostly. She req'd min A usually today with push/pull exercises. SLP encouraged TID completion. "Her voice isn't as hoarse (as it was before therapy started)," daughter said. In strucutred tasks pt maintained a louder voice (without strained voice) 80% of the time. Very little (approx 10%) carryover into conversational comments between reps or tasks.       Assessment / Recommendations / Plan   Plan  Continue with current plan of care      Progression Toward Goals   Progression toward goals  Progressing toward goals       SLP Education - 08/15/17 1652    Education provided  Yes    Education Details  "squeaky" voice means not enough  loudness and strength    Person(s) Educated  Patient;Child(ren)    Methods  Explanation;Demonstration    Comprehension  Verbalized understanding;Need further instruction       SLP Short Term Goals - 08/15/17 1605      SLP SHORT TERM GOAL #1   Title  Pt will complete HEP for vocal fold adduction with usual min A over 3 sessions.    Baseline  08-15-17    Time  2    Period  Weeks    Status  Revised      SLP SHORT TERM GOAL #2   Title  pt will respond with at least 20 sentences average low-70s dB over three sessions    Time  2    Period  Weeks    Status  On-going      SLP SHORT TERM GOAL #3   Title  pt will demo WFL vocal quality, endurance in >80% of 5 minutes simple conversation    Time  2    Period  Weeks    Status  On-going       SLP Long Term Goals - 08/15/17 1622      SLP LONG TERM  GOAL #1   Title  pt will demo HEP for vocal fold adduction with occasional min A over 3 sessions     Time  6    Period  Weeks    Status  Revised      SLP LONG TERM GOAL #2   Title  pt will talk in 15 min simple conversation outside ST room with [facility] loudness and vocal quality over three sessions    Time  6    Period  Weeks    Status  On-going      SLP LONG TERM GOAL #3   Title  Pt/daughter will report improved vocal quality and endurance outside ST room over 3 visits    Time  6    Period  Weeks    Status  On-going       Plan - 08/15/17 1653    Clinical Impression Statement  Pt voice is characterized today by a mildly hoarse quality with pitch breaks/strained voice. Vocal intensity remained mildly reduced and pt was 100% intelligible in quiet environment, however remained unaware of strained/high pitch breaks in her voice throughtout the session, despite SLP ID'ing and bringing to pt's attention. Amplitude-based interventions were successful in improving vocal quality. Daughter to continue to assist pt with her HEP due to pt's memory deficits. I recommend skilled ST to address vocal fold adduction in order to maximize vocal quality and endurance to improve communication and quality of life.     Speech Therapy Frequency  2x / week    Treatment/Interventions  Aspiration precaution training;Functional tasks;SLP instruction and feedback;Patient/family education;Other (comment)    Potential to Achieve Goals  Good    Potential Considerations  Ability to learn/carryover information    SLP Home Exercise Plan  pushing/pulling phonation exercises; will continue training VF adduction exercises    Consulted and Agree with Plan of Care  Patient;Family member/caregiver       Patient will benefit from skilled therapeutic intervention in order to improve the following deficits and impairments:   Other voice and resonance disorders    Problem List Patient Active Problem List   Diagnosis Date Noted   . COPD mixed type (HCC) 08/15/2016  . Weakness 08/15/2016  . Protein-calorie malnutrition, severe 07/28/2016  . Pressure injury of skin 07/27/2016  . Chronic obstructive  pulmonary disease (HCC) 07/26/2016  . Acute on chronic respiratory failure with hypoxia (HCC) 07/26/2016  . Essential hypertension 07/26/2016  . Dyslipidemia associated with type 2 diabetes mellitus (HCC) 07/26/2016  . Diabetes mellitus without complication, without long-term current use of insulin (HCC) 07/26/2016  . Acute bronchitis 07/26/2016  . Underweight 05/27/2015  . Chronic diastolic heart failure (HCC) 11/24/2011    Judy Pollman ,MS, CCC-SLP  08/15/2017, 4:55 PM  [place] [facility] [address] [place], [place], [address] Phone: [phone]   Fax:  [phone]   Name: [name] MRN: [id number] Date of Birth: 10-24-1929

## 2017-08-15 NOTE — Patient Instructions (Signed)
Continue to practice the exercises at least twice a day - try for a third time more and more.

## 2017-08-17 ENCOUNTER — Ambulatory Visit: Payer: Medicare Other | Admitting: Speech Pathology

## 2017-08-17 DIAGNOSIS — R498 Other voice and resonance disorders: Secondary | ICD-10-CM | POA: Diagnosis not present

## 2017-08-17 NOTE — Therapy (Signed)
[facility] [address] [place], [place], [address] Phone: [phone]   Fax:  [phone]  Speech Language Pathology Treatment  Patient Details  Name: [name] MRN: [id number] Date of Birth: 1930/01/27 Referring Provider: Dr. Doran Heater   Encounter Date: 08/17/2017  End of Session - 08/17/17 1200    Visit Number  5    Number of Visits  17    Date for SLP Re-Evaluation  09/29/17    SLP Start Time  1105    SLP Stop Time   1145    SLP Time Calculation (min)  40 min    Activity Tolerance  Patient tolerated treatment well       Past Medical History:  Diagnosis Date  . Acute bronchitis   . Acute on chronic respiratory failure with hypoxia (HCC)   . Benign hypertensive renal disease   . CAD (coronary artery disease)   . Cardiomyopathy    non-ischemic 04/2008 EF 45% cardiac MRI no scar  . CHF (congestive heart failure) (HCC)   . Chronic sinusitis   . COPD (chronic obstructive pulmonary disease) (HCC)   . COPD exacerbation (HCC)   . Depression   . Diabetes mellitus without complication, without long-term current use of insulin (HCC)   . Dyslipidemia associated with type 2 diabetes mellitus (HCC)   . Dyspnea on exertion    chronic  . Hypercholesterolemia   . Hypertension   . On home oxygen therapy    "3L; 24/7; for the last 2 1/2 weeks; none before that" (07/26/2016)  . Protein-calorie malnutrition, severe (HCC)   . Sinus headache    "chronic"  . Stroke [facility]) 1997   "mild"  . Type II diabetes mellitus (HCC)     Past Surgical History:  Procedure Laterality Date  . carotid angiogram     2006  . ESOPHAGOGASTRODUODENOSCOPY     with foreign body removal  . NASAL SINUS SURGERY  ~1974;~ 1976  . TONSILLECTOMY     "when I was a chld"    There were no vitals filed for this visit.  Subjective Assessment - 08/17/17 1108    Subjective  Pt arrives with everyday phrases, some of which she would not say every  day.    Currently in Pain?  No/denies            ADULT SLP TREATMENT - 08/17/17 1105      General Information   Behavior/Cognition  Alert;Cooperative;Pleasant mood;Requires cueing    Patient Positioning  Upright in chair      Treatment Provided   Treatment provided  Cognitive-Linquistic      Pain Assessment   Pain Assessment  No/denies pain      Cognitive-Linquistic Treatment   Treatment focused on  Voice    Skilled Treatment  Pt's daughter reports she is assisting pt with practicing speech 2-3 x per day for about 30 minutes. Occasional min A for push/pull exercises. SLP worked with pt on revising everyday phrases; pt read everyday phrases with rare min A for loudness, occasional min-mod A (verbal cues, demonstration) for adequate breath support. In structured tasks (picture description as pt did not have her glasses) pt maintained louder voice with occasional min-mod A required (20% of the time). Carryover in conversation and side comments to daugher minimal; SLP encouraged cuing for loud speech at home in carryover tasks. Daughter said she plans to have friends visit and remind pt to talk loud.      Assessment / Recommendations /  Plan   Plan  Continue with current plan of care      Progression Toward Goals   Progression toward goals  Progressing toward goals         SLP Short Term Goals - 08/17/17 1200      SLP SHORT TERM GOAL #1   Title  Pt will complete HEP for vocal fold adduction with usual min A over 3 sessions.    Baseline  08-15-17, 08/17/17    Time  2    Period  Weeks    Status  On-going      SLP SHORT TERM GOAL #2   Title  pt will respond with at least 20 sentences average low-70s dB over three sessions    Time  2    Period  Weeks    Status  On-going      SLP SHORT TERM GOAL #3   Title  pt will demo WFL vocal quality, endurance in >80% of 5 minutes simple conversation    Time  2    Period  Weeks    Status  On-going       SLP Long Term Goals -  08/17/17 1201      SLP LONG TERM GOAL #1   Title  pt will demo HEP for vocal fold adduction with occasional min A over 3 sessions     Time  6    Period  Weeks    Status  On-going      SLP LONG TERM GOAL #2   Title  pt will talk in 15 min simple conversation outside ST room with [facility] loudness and vocal quality over three sessions    Time  6    Period  Weeks    Status  On-going      SLP LONG TERM GOAL #3   Title  Pt/daughter will report improved vocal quality and endurance outside ST room over 3 visits    Time  6    Period  Weeks    Status  On-going       Plan - 08/17/17 1200    Clinical Impression Statement  Pt voice is characterized today by a mildly hoarse quality with pitch breaks/strained voice. Vocal intensity remained mildly reduced and pt was 100% intelligible in quiet environment, however remained unaware of strained/high pitch breaks in her voice throughtout the session, despite SLP ID'ing and bringing to pt's attention. Amplitude-based interventions were successful in improving vocal quality. Daughter to continue to assist pt with her HEP due to pt's memory deficits. I recommend skilled ST to address vocal fold adduction in order to maximize vocal quality and endurance to improve communication and quality of life.     Speech Therapy Frequency  2x / week    Treatment/Interventions  Aspiration precaution training;Functional tasks;SLP instruction and feedback;Patient/family education;Other (comment)    Potential to Achieve Goals  Good    Potential Considerations  Ability to learn/carryover information    SLP Home Exercise Plan  pushing/pulling phonation exercises    Consulted and Agree with Plan of Care  Patient       Patient will benefit from skilled therapeutic intervention in order to improve the following deficits and impairments:   Other voice and resonance disorders    Problem List Patient Active Problem List   Diagnosis Date Noted  . COPD mixed type (HCC)  08/15/2016  . Weakness 08/15/2016  . Protein-calorie malnutrition, severe 07/28/2016  . Pressure injury of skin 07/27/2016  . Chronic obstructive pulmonary  disease (HCC) 07/26/2016  . Acute on chronic respiratory failure with hypoxia (HCC) 07/26/2016  . Essential hypertension 07/26/2016  . Dyslipidemia associated with type 2 diabetes mellitus (HCC) 07/26/2016  . Diabetes mellitus without complication, without long-term current use of insulin (HCC) 07/26/2016  . Acute bronchitis 07/26/2016  . Underweight 05/27/2015  . Chronic diastolic heart failure (HCC) 11/24/2011   Rondel Baton, MS, CCC-SLP Speech-Language Pathologist  Arlana Lindau 08/17/2017, 12:02 PM  [place] [facility] [address] [place], [place], [address] Phone: [phone]   Fax:  [phone]   Name: ANALLELI GIERKE MRN: [id number] Date of Birth: 08/23/1929

## 2017-08-17 NOTE — Patient Instructions (Signed)
1) Breathe in, then [facility] Bear down against a chair while saying /a/ 5 times, with a lot of effort. Don't hold your breath.  2) Breathe in, then PULLup of the seat of a chair with both hands while saying /a/ 5 times with a lot of effort.  Don't hold your breath.    3) Read aloud from your list of 10 phrases in your loud, good quality voice.    1. Can you turn the TV on?  2. [place], get out!  3. What will I wear today?  4.  What time is dinner?  5. Who's on the phone?  6. Are these clothes clean?  7. When is the game?  8. Good morning!  9. Hello, this is Bille!  10. How are you feeling?  4) Read out LOUD using additional tasks provided by your therapist (handouts)

## 2017-08-21 ENCOUNTER — Ambulatory Visit: Payer: Medicare Other | Admitting: Speech Pathology

## 2017-08-21 DIAGNOSIS — R498 Other voice and resonance disorders: Secondary | ICD-10-CM

## 2017-08-21 NOTE — Therapy (Signed)
[facility] [address] [place], [place], [address] Phone: [phone]   Fax:  [phone]  Speech Language Pathology Treatment  Patient Details  Name: [name] MRN: [id number] Date of Birth: 08-04-1929 Referring Provider: Dr. Doran Heater   Encounter Date: 08/21/2017  End of Session - 08/21/17 1301    Visit Number  6    Number of Visits  17    Date for SLP Re-Evaluation  09/29/17    SLP Start Time  1102    SLP Stop Time   1144    SLP Time Calculation (min)  42 min    Activity Tolerance  Patient tolerated treatment well       Past Medical History:  Diagnosis Date  . Acute bronchitis   . Acute on chronic respiratory failure with hypoxia (HCC)   . Benign hypertensive renal disease   . CAD (coronary artery disease)   . Cardiomyopathy    non-ischemic 04/2008 EF 45% cardiac MRI no scar  . CHF (congestive heart failure) (HCC)   . Chronic sinusitis   . COPD (chronic obstructive pulmonary disease) (HCC)   . COPD exacerbation (HCC)   . Depression   . Diabetes mellitus without complication, without long-term current use of insulin (HCC)   . Dyslipidemia associated with type 2 diabetes mellitus (HCC)   . Dyspnea on exertion    chronic  . Hypercholesterolemia   . Hypertension   . On home oxygen therapy    "3L; 24/7; for the last 2 1/2 weeks; none before that" (07/26/2016)  . Protein-calorie malnutrition, severe (HCC)   . Sinus headache    "chronic"  . Stroke [facility]) 1997   "mild"  . Type II diabetes mellitus (HCC)     Past Surgical History:  Procedure Laterality Date  . carotid angiogram     2006  . ESOPHAGOGASTRODUODENOSCOPY     with foreign body removal  . NASAL SINUS SURGERY  ~1974;~ 1976  . TONSILLECTOMY     "when I was a chld"    There were no vitals filed for this visit.  Subjective Assessment - 08/21/17 1105    Subjective  Pt reports practicing speech with her granddaughter    Currently in  Pain?  No/denies            ADULT SLP TREATMENT - 08/21/17 1106      General Information   Behavior/Cognition  Alert;Cooperative;Pleasant mood;Requires cueing    Patient Positioning  Upright in chair    Oral care provided  N/A      Treatment Provided   Treatment provided  Cognitive-Linquistic      Pain Assessment   Pain Assessment  No/denies pain      Cognitive-Linquistic Treatment   Treatment focused on  Voice    Skilled Treatment  In sentence responses, pt required occasional min A, responding with adequate vocal quality and WNL intensity (>70 dB). SLP attempted to facilitate awareness of strained/hoarse vocal quality between structured tasks; with nonverbal cue pt corrects ~30% of the time; verbal cues required for remainder of the time. Daughter demo'd appropriate cuing for loudness; she reports she and family members have been encouraging louder voice at home. With modeling, pt demo'd improved carryover in simple conversation and spontaneous responses between structured tasks. In 5 min simple conversation, pt used adequate vocal intensity ~75% of the time; occasional min A required.      Assessment / Recommendations / Plan   Plan  Continue with current plan of  care;Goals updated      Progression Toward Goals   Progression toward goals  Progressing toward goals       SLP Education - 08/21/17 1300    Education provided  Yes    Education Details  breathe and get louder when voice is "squeaky"    Person(s) Educated  Patient    Methods  Explanation;Verbal cues    Comprehension  Verbal cues required;Need further instruction;Returned demonstration;Verbalized understanding       SLP Short Term Goals - 08/21/17 1306      SLP SHORT TERM GOAL #1   Title  Pt will complete HEP for vocal fold adduction with usual min A over 3 sessions.    Baseline  08-15-17, 08/17/17    Time  1    Period  Weeks    Status  On-going      SLP SHORT TERM GOAL #2   Title  pt will respond with at  least 20 sentences average low-70s dB with occasional min A over 2 sessions    Baseline  08/21/17    Time  1    Period  Weeks    Status  Revised      SLP SHORT TERM GOAL #3   Title  pt will demo WFL vocal quality, endurance in >80% of 5 minutes simple conversation    Time  1    Period  Weeks    Status  On-going       SLP Long Term Goals - 08/21/17 1314      SLP LONG TERM GOAL #1   Title  pt will demo HEP for vocal fold adduction with occasional min A over 3 sessions     Time  5    Period  Weeks    Status  On-going      SLP LONG TERM GOAL #2   Title  pt will talk in 15 min simple conversation outside ST room with [facility] loudness and vocal quality over three sessions    Time  5    Period  Weeks    Status  On-going      SLP LONG TERM GOAL #3   Title  Pt/daughter will report improved vocal quality and endurance outside ST room over 3 visits    Time  5    Period  Weeks    Status  On-going       Plan - 08/21/17 1301    Clinical Impression Statement  Pt voice is characterized today by a mildly hoarse quality with pitch breaks/strained voice. Vocal intensity remained mildly reduced and pt was 100% intelligible in quiet environment. Pt with decreased awareness of strained/high pitch breaks in her voice (intermittently aware); daughter demonstrating appropriate cuing for increased vocal intensity. Amplitude-based interventions were successful in improving vocal quality. Daughter to continue to assist pt with her HEP due to pt's memory deficits. I recommend skilled ST to address vocal fold adduction in order to maximize vocal quality and endurance to improve communication and quality of life.     Speech Therapy Frequency  2x / week    Treatment/Interventions  Aspiration precaution training;Functional tasks;SLP instruction and feedback;Patient/family education;Other (comment)    Potential to Achieve Goals  Good    Potential Considerations  Ability to learn/carryover information    Consulted  and Agree with Plan of Care  Patient       Patient will benefit from skilled therapeutic intervention in order to improve the following deficits and impairments:   Other voice  and resonance disorders    Problem List Patient Active Problem List   Diagnosis Date Noted  . COPD mixed type (HCC) 08/15/2016  . Weakness 08/15/2016  . Protein-calorie malnutrition, severe 07/28/2016  . Pressure injury of skin 07/27/2016  . Chronic obstructive pulmonary disease (HCC) 07/26/2016  . Acute on chronic respiratory failure with hypoxia (HCC) 07/26/2016  . Essential hypertension 07/26/2016  . Dyslipidemia associated with type 2 diabetes mellitus (HCC) 07/26/2016  . Diabetes mellitus without complication, without long-term current use of insulin (HCC) 07/26/2016  . Acute bronchitis 07/26/2016  . Underweight 05/27/2015  . Chronic diastolic heart failure (HCC) 11/24/2011   Rondel Baton, MS, CCC-SLP Speech-Language Pathologist  Arlana Lindau 08/21/2017, 1:15 PM  [facility] [address] [place], [place], [address] Phone: [phone]   Fax:  [phone]   Name: [name] MRN: [id number] Date of Birth: 1929-11-04

## 2017-08-24 ENCOUNTER — Ambulatory Visit: Payer: Medicare Other | Attending: Otolaryngology | Admitting: Speech Pathology

## 2017-08-24 DIAGNOSIS — R498 Other voice and resonance disorders: Secondary | ICD-10-CM | POA: Diagnosis not present

## 2017-08-24 NOTE — Therapy (Signed)
[facility] [address] [place], [place], [address] Phone: [phone]   Fax:  [phone]  Speech Language Pathology Treatment  Patient Details  Name: [name] MRN: [id number] Date of Birth: 22-Feb-1930 Referring Provider: Dr. Doran Heater   Encounter Date: 08/24/2017  End of Session - 08/24/17 1449    Visit Number  7    Number of Visits  17    Date for SLP Re-Evaluation  09/29/17    SLP Start Time  1402    SLP Stop Time   1445    SLP Time Calculation (min)  43 min    Activity Tolerance  Patient tolerated treatment well       Past Medical History:  Diagnosis Date  . Acute bronchitis   . Acute on chronic respiratory failure with hypoxia (HCC)   . Benign hypertensive renal disease   . CAD (coronary artery disease)   . Cardiomyopathy    non-ischemic 04/2008 EF 45% cardiac MRI no scar  . CHF (congestive heart failure) (HCC)   . Chronic sinusitis   . COPD (chronic obstructive pulmonary disease) (HCC)   . COPD exacerbation (HCC)   . Depression   . Diabetes mellitus without complication, without long-term current use of insulin (HCC)   . Dyslipidemia associated with type 2 diabetes mellitus (HCC)   . Dyspnea on exertion    chronic  . Hypercholesterolemia   . Hypertension   . On home oxygen therapy    "3L; 24/7; for the last 2 1/2 weeks; none before that" (07/26/2016)  . Protein-calorie malnutrition, severe (HCC)   . Sinus headache    "chronic"  . Stroke [facility]) 1997   "mild"  . Type II diabetes mellitus (HCC)     Past Surgical History:  Procedure Laterality Date  . carotid angiogram     2006  . ESOPHAGOGASTRODUODENOSCOPY     with foreign body removal  . NASAL SINUS SURGERY  ~1974;~ 1976  . TONSILLECTOMY     "when I was a chld"    There were no vitals filed for this visit.  Subjective Assessment - 08/24/17 1407    Subjective  "I'm supposed to say these loud?"    Patient is accompained by:  Family  member    Currently in Pain?  No/denies            ADULT SLP TREATMENT - 08/24/17 1402      General Information   Behavior/Cognition  Alert;Cooperative;Pleasant mood;Requires cueing    Patient Positioning  Upright in chair      Treatment Provided   Treatment provided  Cognitive-Linquistic      Pain Assessment   Pain Assessment  No/denies pain      Cognitive-Linquistic Treatment   Treatment focused on  Voice    Skilled Treatment  Everyday phrases averaged 77 dB at 30 cm. Pt generated sentences, 18/20 above 70dB average with occasional min A. In 5 minutes simple conversation, pt maintained adequate voicing and vocal intensity 80% of the time (occasional min A required remaining 20% of the time). With longer conversation and structured reading tasks, frequent min A required. Daughter demo'd appropriate cuing for loudness and breath support.       Assessment / Recommendations / Plan   Plan  Continue with current plan of care;Goals updated      Progression Toward Goals   Progression toward goals  Progressing toward goals         SLP Short Term Goals - 08/24/17  1410      SLP SHORT TERM GOAL #1   Title  Pt will complete HEP for vocal fold adduction with usual min A over 3 sessions.    Baseline  08-15-17, 08/17/17, 08/24/17    Time  1    Period  Weeks    Status  Achieved      SLP SHORT TERM GOAL #2   Title  pt will respond with at least 20 sentences average low-70s dB with occasional min A over 2 sessions    Baseline  08/21/17, 08/24/17    Time  1    Period  Weeks    Status  Achieved      SLP SHORT TERM GOAL #3   Title  pt will demo WFL vocal quality, endurance in >80% of 5 minutes simple conversation    Time  1    Period  Weeks    Status  Achieved       SLP Long Term Goals - 08/24/17 1448      SLP LONG TERM GOAL #1   Title  pt will demo HEP for vocal fold adduction with occasional min A over 3 sessions     Time  5    Period  Weeks    Status  On-going      SLP  LONG TERM GOAL #2   Title  pt will talk in 15 min simple conversation outside ST room with [facility] loudness and vocal quality over three sessions    Time  5    Period  Weeks    Status  On-going      SLP LONG TERM GOAL #3   Title  Pt/daughter will report improved vocal quality and endurance outside ST room over 3 visits    Time  5    Period  Weeks    Status  On-going       Plan - 08/24/17 1449    Clinical Impression Statement  Hoarse quality with pitch breaks/strained voice persists, secondary to decreased respiratory support and 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 Vocal intensity remained mildly reduced and pt was 100% intelligible in quiet environment. Pt with decreased awareness of strained/high pitch breaks in her voice (intermittently aware); daughter demonstrating appropriate cuing for increased vocal intensity. Amplitude-based interventions successful in improving vocal quality. Daughter to continue to assist pt with her HEP due to pt's memory deficits. I recommend skilled ST to address vocal fold adduction in order to maximize vocal quality and endurance to improve communication and quality of life.     Speech Therapy Frequency  2x / week    Treatment/Interventions  Aspiration precaution training;Functional tasks;SLP instruction and feedback;Patient/family education;Other (comment)    Potential to Achieve Goals  Good    Potential Considerations  Ability to learn/carryover information    Consulted and Agree with Plan of Care  Patient       Patient will benefit from skilled therapeutic intervention in order to improve the following deficits and impairments:   Other voice and resonance disorders    Problem List Patient Active Problem List   Diagnosis Date Noted  . COPD mixed type (HCC) 08/15/2016  . Weakness 08/15/2016  . Protein-calorie malnutrition, severe 07/28/2016  . Pressure injury of skin 07/27/2016  . Chronic obstructive pulmonary disease (HCC) 07/26/2016  . Acute on chronic  respiratory failure with hypoxia (HCC) 07/26/2016  . Essential hypertension 07/26/2016  . Dyslipidemia associated with type 2 diabetes mellitus (HCC) 07/26/2016  . Diabetes mellitus without complication, without long-term  current use of insulin (HCC) 07/26/2016  . Acute bronchitis 07/26/2016  . Underweight 05/27/2015  . Chronic diastolic heart failure (HCC) 11/24/2011   Rondel Baton, MS, CCC-SLP Speech-Language Pathologist  Arlana Lindau 08/24/2017, 2:54 PM  [facility] [address] [place], [place], [address] Phone: [phone]   Fax:  [phone]   Name: [name] MRN: [id number] Date of Birth: 28-Jan-1930

## 2017-08-28 ENCOUNTER — Ambulatory Visit: Payer: Medicare Other | Admitting: Speech Pathology

## 2017-08-28 DIAGNOSIS — R498 Other voice and resonance disorders: Secondary | ICD-10-CM

## 2017-08-28 NOTE — Therapy (Signed)
[facility] [address] [place], [place], [address] Phone: [phone]   Fax:  [phone]  Speech Language Pathology Treatment  Patient Details  Name: [name] MRN: [id number] Date of Birth: Jan 02, 1930 Referring Provider: Dr. Doran Heater   Encounter Date: 08/28/2017  End of Session - 08/28/17 1206    Visit Number  8    Number of Visits  17    Date for SLP Re-Evaluation  09/29/17    SLP Start Time  1100    SLP Stop Time   1143    SLP Time Calculation (min)  43 min    Activity Tolerance  Patient tolerated treatment well       Past Medical History:  Diagnosis Date  . Acute bronchitis   . Acute on chronic respiratory failure with hypoxia (HCC)   . Benign hypertensive renal disease   . CAD (coronary artery disease)   . Cardiomyopathy    non-ischemic 04/2008 EF 45% cardiac MRI no scar  . CHF (congestive heart failure) (HCC)   . Chronic sinusitis   . COPD (chronic obstructive pulmonary disease) (HCC)   . COPD exacerbation (HCC)   . Depression   . Diabetes mellitus without complication, without long-term current use of insulin (HCC)   . Dyslipidemia associated with type 2 diabetes mellitus (HCC)   . Dyspnea on exertion    chronic  . Hypercholesterolemia   . Hypertension   . On home oxygen therapy    "3L; 24/7; for the last 2 1/2 weeks; none before that" (07/26/2016)  . Protein-calorie malnutrition, severe (HCC)   . Sinus headache    "chronic"  . Stroke [facility]) 1997   "mild"  . Type II diabetes mellitus (HCC)     Past Surgical History:  Procedure Laterality Date  . carotid angiogram     2006  . ESOPHAGOGASTRODUODENOSCOPY     with foreign body removal  . NASAL SINUS SURGERY  ~1974;~ 1976  . TONSILLECTOMY     "when I was a chld"    There were no vitals filed for this visit.  Subjective Assessment - 08/28/17 1103    Subjective  Pt arrives with daughter and books, games they used to practice at home.     Patient is accompained by:  Family member    Currently in Pain?  No/denies            ADULT SLP TREATMENT - 08/28/17 1100      General Information   Behavior/Cognition  Alert;Cooperative;Pleasant mood;Requires cueing    Patient Positioning  Upright in chair      Treatment Provided   Treatment provided  Cognitive-Linquistic      Pain Assessment   Pain Assessment  No/denies pain      Cognitive-Linquistic Treatment   Treatment focused on  Voice    Skilled Treatment   Min A x2 from daughter for increased volume during adduction HEP. In simple multiparagraph reading (10 minutes), vocal quality and intensity adequate 90% of the time; pt unaware of decline in vocal quality and occasional min A required for increased vocal intensity. SLP engaged pt in short conversations between structured tasks in order to improve carryover of loudness in spontaneous responses. Pt required occasional min A.       Assessment / Recommendations / Plan   Plan  Continue with current plan of care;Goals updated      Progression Toward Goals   Progression toward goals  Progressing toward goals  SLP Short Term Goals - 08/28/17 1113      SLP SHORT TERM GOAL #1   Title  Pt will complete HEP for vocal fold adduction with usual min A over 3 sessions.    Status  Achieved      SLP SHORT TERM GOAL #2   Title  pt will respond with at least 20 sentences average low-70s dB with occasional min A over 2 sessions    Status  Achieved      SLP SHORT TERM GOAL #3   Title  pt will demo [facility] vocal quality, endurance in >80% of 5 minutes simple conversation    Status  Achieved       SLP Long Term Goals - 08/28/17 1113      SLP LONG TERM GOAL #1   Title  pt will demo HEP for vocal fold adduction with occasional min A over 3 sessions     Baseline  08/28/17    Time  4    Period  Weeks    Status  On-going      SLP LONG TERM GOAL #2   Title  pt will talk in 15 min simple conversation outside ST room  with [facility] loudness and vocal quality over three sessions    Time  4    Period  Weeks    Status  On-going      SLP LONG TERM GOAL #3   Title  Pt/daughter will report improved vocal quality and endurance outside ST room over 3 visits    Time  4    Period  Weeks    Status  On-going       Plan - 08/28/17 1206    Clinical Impression Statement  Hoarse quality with pitch breaks/strained voice persists, secondary to decreased respiratory support and 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 Vocal intensity remained mildly reduced and pt was 100% intelligible in quiet environment. Pt with decreased awareness of strained/high pitch breaks in her voice (intermittently aware); daughter demonstrating appropriate cuing for increased vocal intensity. Amplitude-based interventions successful in improving vocal quality. Daughter to continue to assist pt with her HEP due to pt's memory deficits. I recommend skilled ST to address vocal fold adduction in order to maximize vocal quality and endurance to improve communication and quality of life.     Speech Therapy Frequency  2x / week    Treatment/Interventions  Aspiration precaution training;Functional tasks;SLP instruction and feedback;Patient/family education;Other (comment)    Potential to Achieve Goals  Good    Potential Considerations  Ability to learn/carryover information    Consulted and Agree with Plan of Care  Patient       Patient will benefit from skilled therapeutic intervention in order to improve the following deficits and impairments:   Other voice and resonance disorders    Problem List Patient Active Problem List   Diagnosis Date Noted  . COPD mixed type (HCC) 08/15/2016  . Weakness 08/15/2016  . Protein-calorie malnutrition, severe 07/28/2016  . Pressure injury of skin 07/27/2016  . Chronic obstructive pulmonary disease (HCC) 07/26/2016  . Acute on chronic respiratory failure with hypoxia (HCC) 07/26/2016  . Essential hypertension 07/26/2016   . Dyslipidemia associated with type 2 diabetes mellitus (HCC) 07/26/2016  . Diabetes mellitus without complication, without long-term current use of insulin (HCC) 07/26/2016  . Acute bronchitis 07/26/2016  . Underweight 05/27/2015  . Chronic diastolic heart failure (HCC) 11/24/2011   Rondel Baton, MS, CCC-SLP Speech-Language Pathologist   Arlana Lindau 08/28/2017, 12:07  PM  [facility] [address] [place], [place], [address] Phone: [phone]   Fax:  [phone]   Name: [name] MRN: [id number] Date of Birth: 12-16-1929

## 2017-08-31 ENCOUNTER — Encounter: Payer: Medicare Other | Admitting: Speech Pathology

## 2017-09-04 ENCOUNTER — Ambulatory Visit: Payer: Medicare Other | Admitting: Speech Pathology

## 2017-09-04 DIAGNOSIS — R498 Other voice and resonance disorders: Secondary | ICD-10-CM | POA: Diagnosis not present

## 2017-09-04 NOTE — Therapy (Signed)
[facility] [address] [place], [place], [address] Phone: [phone]   Fax:  [phone]  Speech Language Pathology Treatment  Patient Details  Name: [name] MRN: [id number] Date of Birth: Sep 16, 1929 Referring Provider: Dr. Doran Heater   Encounter Date: 09/04/2017  End of Session - 09/04/17 1218    Visit Number  9    Number of Visits  17    Date for SLP Re-Evaluation  09/29/17    SLP Start Time  1102    SLP Stop Time   1143    SLP Time Calculation (min)  41 min    Activity Tolerance  Patient tolerated treatment well       Past Medical History:  Diagnosis Date  . Acute bronchitis   . Acute on chronic respiratory failure with hypoxia (HCC)   . Benign hypertensive renal disease   . CAD (coronary artery disease)   . Cardiomyopathy    non-ischemic 04/2008 EF 45% cardiac MRI no scar  . CHF (congestive heart failure) (HCC)   . Chronic sinusitis   . COPD (chronic obstructive pulmonary disease) (HCC)   . COPD exacerbation (HCC)   . Depression   . Diabetes mellitus without complication, without long-term current use of insulin (HCC)   . Dyslipidemia associated with type 2 diabetes mellitus (HCC)   . Dyspnea on exertion    chronic  . Hypercholesterolemia   . Hypertension   . On home oxygen therapy    "3L; 24/7; for the last 2 1/2 weeks; none before that" (07/26/2016)  . Protein-calorie malnutrition, severe (HCC)   . Sinus headache    "chronic"  . Stroke [facility]) 1997   "mild"  . Type II diabetes mellitus (HCC)     Past Surgical History:  Procedure Laterality Date  . carotid angiogram     2006  . ESOPHAGOGASTRODUODENOSCOPY     with foreign body removal  . NASAL SINUS SURGERY  ~1974;~ 1976  . TONSILLECTOMY     "when I was a chld"    There were no vitals filed for this visit.  Subjective Assessment - 09/04/17 1104    Subjective  "I get a little scratchy every now and then."    Patient is  accompained by:  Family member    Currently in Pain?  No/denies            ADULT SLP TREATMENT - 09/04/17 1102      General Information   Behavior/Cognition  Alert;Cooperative;Pleasant mood;Requires cueing    Patient Positioning  Upright in chair    Oral care provided  N/A      Treatment Provided   Treatment provided  Cognitive-Linquistic      Pain Assessment   Pain Assessment  No/denies pain      Cognitive-Linquistic Treatment   Treatment focused on  Voice    Skilled Treatment  Pt required usual min-mod A today for HEP. Functional phrases completed WNL loudness and with good vocal quality (9/10, corrected with verbal cue for increased intensity x1). 10 minutes simple multiparagraph reading completed with adequate vocal quality, intensity 85% of the time; occasional min-mod A ( verbal cues, modeling on 3 occasions). In simple conversation, pt without awareness of deteriorating vocal quality with lengthier utterances. SLP worked with patient using longer sentences with written reminders to breathe more frequently; pt required usual mod A (visual cues and demonstration) in addition to written cues. Daughter reports improvement in vocal quality and endurance at home, states she continues  to remind pt to get louder.      Assessment / Recommendations / Plan   Plan  Other (Comment) decrease frequency to 1x per week      Progression Toward Goals   Progression toward goals  Goals downgraded         SLP Short Term Goals - 09/04/17 1105      SLP SHORT TERM GOAL #1   Title  Pt will complete HEP for vocal fold adduction with usual min A over 3 sessions.    Status  Achieved      SLP SHORT TERM GOAL #2   Title  pt will respond with at least 20 sentences average low-70s dB with occasional min A over 2 sessions    Status  Achieved      SLP SHORT TERM GOAL #3   Title  pt will demo [facility] vocal quality, endurance in >80% of 5 minutes simple conversation    Status  Achieved       SLP  Long Term Goals - 09/04/17 1106      SLP LONG TERM GOAL #1   Title  pt will demo HEP for vocal fold adduction with occasional min A over 3 sessions     Baseline  08/28/17    Time  3    Period  Weeks    Status  On-going      SLP LONG TERM GOAL #2   Title  pt will talk in 15 min simple conversation with WFL loudness and vocal quality over three sessions with occasional min A from communication partner    Time  3    Period  Weeks    Status  Revised      SLP LONG TERM GOAL #3   Title  Pt/daughter will report improved vocal quality and endurance outside ST room over 3 visits    Baseline  09/04/17    Time  3    Period  Weeks    Status  On-going       Plan - 09/04/17 1219    Clinical Impression Statement  Hoarse quality with pitch breaks/strained voice persists, secondary to decreased respiratory support and 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  Pt with decreased awareness of strained/high pitch breaks in her voice (intermittently aware); daughter demonstrating appropriate cuing for increased vocal intensity. Amplitude-based interventions successful in improving vocal quality; pt continues to require cues for carryover in conversation. Anticipate pt will require ongoing cues due to memory deficits. Will decrease frequency to 1x per week; pt/daughter in agreement. Continue skilled ST 2-3 more sessions to address vocal fold adduction and for caregiver training in strategies and techniques for cuing pt to improve vocal quality and endurance.    Speech Therapy Frequency  1x /week    Treatment/Interventions  Aspiration precaution training;Functional tasks;SLP instruction and feedback;Patient/family education;Other (comment)    Potential to Achieve Goals  Fair    Potential Considerations  Ability to learn/carryover information    SLP Home Exercise Plan  pushing/pulling phonation exercises    Consulted and Agree with Plan of Care  Patient;Family member/caregiver       Patient will benefit from skilled  therapeutic intervention in order to improve the following deficits and impairments:   Other voice and resonance disorders    Problem List Patient Active Problem List   Diagnosis Date Noted  . COPD mixed type (HCC) 08/15/2016  . Weakness 08/15/2016  . Protein-calorie malnutrition, severe 07/28/2016  . Pressure injury of skin 07/27/2016  . Chronic  obstructive pulmonary disease (HCC) 07/26/2016  . Acute on chronic respiratory failure with hypoxia (HCC) 07/26/2016  . Essential hypertension 07/26/2016  . Dyslipidemia associated with type 2 diabetes mellitus (HCC) 07/26/2016  . Diabetes mellitus without complication, without long-term current use of insulin (HCC) 07/26/2016  . Acute bronchitis 07/26/2016  . Underweight 05/27/2015  . Chronic diastolic heart failure (HCC) 11/24/2011   Rondel Baton, MS, CCC-SLP Speech-Language Pathologist  Arlana Lindau 09/04/2017, 12:29 PM   [facility] [address] [place], [place], [address] Phone: [phone]   Fax:  [phone]   Name: [name] MRN: [id number] Date of Birth: [birth year]

## 2017-09-04 NOTE — Patient Instructions (Signed)
   My mom drove me to school fifteen minutes late on Tuesday.  The girl wore her hair in two braids, tied with two blue bows.  The mouse was so hungry he ran across the kitchen floor without even looking for humans.  The tape got stuck on my lips so I couldn't talk anymore.  The door slammed down on my hand and I screamed like a little baby.  My shoes are blue with yellow stripes and green stars on the front.  The mailbox was bent and broken and looked like someone had knocked it over on purpose.  I was so thirsty I couldn't wait to get a drink of water.  I found a gold coin on the playground after school today.  The chocolate chip cookies smelled so good that I ate one without asking.  My bandaid wasn't sticky any more so it fell off on the way to school.  He had a sore throat so I gave him my bottle of water and told him to keep it.  The church was white and brown and looked very old.  I was so scared to go to a monster movie but my dad said he would sit with me so we went last night.  Your mom is so nice she gave me a ride home today.  I fell in the mud when I was walking home from school today.  This dinner is so delicious I can't stop eating.  The school principal was so mean that all the children were scared of him.  I went to the dentist the other day and he let me pick a prize out of the prize box.  The box was small and wrapped in paper with tiny silver and red glitter dots.  My dad is so funny that he told us jokes all night long and we never fell asleep.  The camping trip was so awesome that I didn't want to come home.  Are you going to have a blue birthday cake for your next birthday?  How did you know that I was going to have a peanut butter sandwich for lunch?  That boy is so mean that he doesn't care if a door slams in your face or if he cuts in line.  The moms and dads all sat around drinking coffee and eating donuts.  My mom made a milkshake with  frozen bananas and chocolate sauce.  My pen broke and leaked blue ink all over my new dress.  I got my haircut today and they did it way too short.  My pet turtle, [name], got out of his cage and I could not find him anywhere.

## 2017-09-12 DIAGNOSIS — E119 Type 2 diabetes mellitus without complications: Secondary | ICD-10-CM | POA: Diagnosis not present

## 2017-09-12 DIAGNOSIS — I10 Essential (primary) hypertension: Secondary | ICD-10-CM | POA: Diagnosis not present

## 2017-09-12 DIAGNOSIS — E7800 Pure hypercholesterolemia, unspecified: Secondary | ICD-10-CM | POA: Diagnosis not present

## 2017-09-12 DIAGNOSIS — Z79899 Other long term (current) drug therapy: Secondary | ICD-10-CM | POA: Diagnosis not present

## 2017-09-12 DIAGNOSIS — J069 Acute upper respiratory infection, unspecified: Secondary | ICD-10-CM | POA: Diagnosis not present

## 2017-09-12 DIAGNOSIS — I482 Chronic atrial fibrillation: Secondary | ICD-10-CM | POA: Diagnosis not present

## 2017-09-12 DIAGNOSIS — J449 Chronic obstructive pulmonary disease, unspecified: Secondary | ICD-10-CM | POA: Diagnosis not present

## 2017-09-12 DIAGNOSIS — E43 Unspecified severe protein-calorie malnutrition: Secondary | ICD-10-CM | POA: Diagnosis not present

## 2017-09-14 ENCOUNTER — Other Ambulatory Visit: Payer: Self-pay | Admitting: *Deleted

## 2017-09-14 MED ORDER — UMECLIDINIUM BROMIDE 62.5 MCG/INH IN AEPB: 1.0000 | INHALATION_SPRAY | Freq: Every day | RESPIRATORY_TRACT | 11 refills | Status: DC

## 2017-09-21 ENCOUNTER — Encounter: Payer: Medicare Other | Admitting: Speech Pathology

## 2017-09-25 ENCOUNTER — Encounter: Payer: Medicare Other | Admitting: Speech Pathology

## 2017-09-25 ENCOUNTER — Ambulatory Visit
Admission: RE | Admit: 2017-09-25 | Discharge: 2017-09-25 | Disposition: A | Discharge status: Home / Self Care | Payer: Medicare Other | Source: Ambulatory Visit | Attending: Cardiovascular Disease | Admitting: Cardiovascular Disease

## 2017-09-25 ENCOUNTER — Ambulatory Visit (INDEPENDENT_AMBULATORY_CARE_PROVIDER_SITE_OTHER): Payer: Medicare Other | Admitting: Cardiovascular Disease

## 2017-09-25 ENCOUNTER — Encounter (INDEPENDENT_AMBULATORY_CARE_PROVIDER_SITE_OTHER): Payer: Self-pay

## 2017-09-25 ENCOUNTER — Encounter: Payer: Self-pay | Admitting: Cardiovascular Disease

## 2017-09-25 VITALS — BP 142/78 | HR 59 | Ht 62.0 in | Wt 87.8 lb

## 2017-09-25 DIAGNOSIS — R05 Cough: Secondary | ICD-10-CM

## 2017-09-25 DIAGNOSIS — I5032 Chronic diastolic (congestive) heart failure: Secondary | ICD-10-CM

## 2017-09-25 DIAGNOSIS — I10 Essential (primary) hypertension: Secondary | ICD-10-CM

## 2017-09-25 DIAGNOSIS — R059 Cough, unspecified: Secondary | ICD-10-CM

## 2017-09-25 DIAGNOSIS — R0602 Shortness of breath: Secondary | ICD-10-CM

## 2017-09-25 DIAGNOSIS — I255 Ischemic cardiomyopathy: Secondary | ICD-10-CM | POA: Diagnosis not present

## 2017-09-25 NOTE — Patient Instructions (Signed)
Medication Instructions:  Your physician recommends that you continue on your current medications as directed. Please refer to the Current Medication list given to you today.  Labwork: NONE  Testing/Procedures: A chest x-ray takes a picture of the organs and structures inside the chest, including the heart, lungs, and blood vessels. This test can show several things, including, whether the heart is enlarges; whether fluid is building up in the lungs; and whether pacemaker / defibrillator leads are still in place.   Follow-Up: Your physician wants you to follow-up in: 12 months with Dr. Nishan. You will receive a reminder letter in the mail two months in advance. If you don't receive a letter, please call our office to schedule the follow-up appointment.   If you need a refill on your cardiac medications before your next appointment, please call your pharmacy.    

## 2017-09-25 NOTE — Progress Notes (Signed)
CARDIOLOGY OFFICE NOTE  Date:  09/25/2017    [name] Date of Birth: [birth year] Medical Record #[id number]  PCP:  [name], MD  Cardiologist:  [name]    No chief complaint on file.   History of Present Illness: [name] is a 84 y.o. female who presents today for a one year check.     She has a history of diastolic CHF, HTN, DM2, COPD with ongoing tobacco abuse and atrial fibrillation. She was taken off of Coumadin due to age and low body mass. Her last 2D echo was in 2012, demonstrating normal EF at 60-65% and Grade I diastolic dysfunction. Her last 2D echo was in 2012, demonstrating normal EF at 60-65% and Grade I diastolic dysfunction.   Continue to have weight loss and dementia. Declining health overall Some dizziness but BP systolic not Under 100 labs ok done by Dr Kriste Basque   Cough worse recently Had 6 day prednisone course with primary ? Allergies No fever or sputum   Past Medical History:  Diagnosis Date  . Acute bronchitis   . Acute on chronic respiratory failure with hypoxia (HCC)   . Benign hypertensive renal disease   . CAD (coronary artery disease)   . Cardiomyopathy    non-ischemic 04/2008 EF 45% cardiac MRI no scar  . CHF (congestive heart failure) (HCC)   . Chronic sinusitis   . COPD (chronic obstructive pulmonary disease) (HCC)   . COPD exacerbation (HCC)   . Depression   . Diabetes mellitus without complication, without long-term current use of insulin (HCC)   . Dyslipidemia associated with type 2 diabetes mellitus (HCC)   . Dyspnea on exertion    chronic  . Hypercholesterolemia   . Hypertension   . On home oxygen therapy    "3L; 24/7; for the last 2 1/2 weeks; none before that" (07/26/2016)  . Protein-calorie malnutrition, severe (HCC)   . Sinus headache    "chronic"  . Stroke [facility]) 1997   "mild"  . Type II diabetes mellitus (HCC)     Past Surgical History:  Procedure Laterality Date  . carotid angiogram     2006  .  ESOPHAGOGASTRODUODENOSCOPY     with foreign body removal  . NASAL SINUS SURGERY  ~1974;~ 1976  . TONSILLECTOMY     "when I was a chld"     Medications: Current Outpatient Medications  Medication Sig Dispense Refill  . albuterol (VENTOLIN HFA) 108 (90 Base) MCG/ACT inhaler Inhale 2 puffs into the lungs every 6 (six) hours as needed for wheezing or shortness of breath. 1 Inhaler 5  . amoxicillin-clavulanate (AUGMENTIN) 875-125 MG tablet Take 1 tablet by mouth as directed.  0  . aspirin EC 81 MG tablet Take 81 mg by mouth daily.    Marland Kitchen atorvastatin (LIPITOR) 10 MG tablet TAKE 1 TABLET(10 MG) BY MOUTH DAILY AT 6 PM 90 tablet 3  . benazepril (LOTENSIN) 10 MG tablet Take 10 mg by mouth as directed.    . budesonide-formoterol (SYMBICORT) 160-4.5 MCG/ACT inhaler INHALE 2 PUFFS INTO THE LUNGS TWICE DAILY 10.2 g 2  . carvedilol (COREG) 6.25 MG tablet Take 1 tablet (6.25 mg total) by mouth 2 (two) times daily with a meal. 180 tablet 3  . DIGOX 125 MCG tablet TAKE 1/2 TABLET(0.063 MG) BY MOUTH DAILY 30 tablet 1  . ferrous sulfate 325 (65 FE) MG tablet Take 325 mg by mouth daily with breakfast.    . fluticasone (FLONASE) 50 MCG/ACT nasal  spray Place 1 spray into both nostrils daily as needed for allergies.  3  . furosemide (LASIX) 20 MG tablet Take 1 tablet (20 mg total) by mouth daily. 90 tablet 3  . insulin aspart (NOVOLOG) 100 UNIT/ML injection Inject into the skin daily as needed for high blood sugar (For CBG >300).    . metFORMIN (GLUCOPHAGE-XR) 500 MG 24 hr tablet Take 1,000 mg by mouth 2 (two) times daily.   5  . Multiple Vitamin (MV-ONE PO) Take 1 tablet by mouth daily.    Marland Kitchen Spacer/Aero-Holding Chambers (AEROCHAMBER MV) inhaler Use as instructed 1 each 0  . umeclidinium bromide (INCRUSE ELLIPTA) 62.5 MCG/INH AEPB Inhale 1 puff into the lungs daily. 30 each 11  . UNABLE TO FIND Med Name: Med pass 120 mL by mouth 3 times daily     No current facility-administered medications for this visit.      Allergies: No Known Allergies  Social History: The patient  reports that she quit smoking about 14 months ago. Her smoking use included cigarettes. She has a 52.50 pack-year smoking history. She has never used smokeless tobacco. She reports that she does not drink alcohol or use drugs.   Family History: The patient's family history includes Alcohol abuse in her brother and sister; Coronary artery disease in her unknown relative; Heart disease in her father.   Review of Systems: Please see the history of present illness.   Otherwise, the review of systems is positive for none.   All other systems are reviewed and negative.   Physical Exam: VS:  BP (!) 142/78   Pulse (!) 59   Ht 5\' 2"  (1.575 m)   Wt 87 lb 12 oz (39.8 kg)   BMI 16.05 kg/m  .  BMI Body mass index is 16.05 kg/m.  Wt Readings from Last 3 Encounters:  09/25/17 87 lb 12 oz (39.8 kg)  06/22/17 89 lb 9.6 oz (40.6 kg)  02/21/17 89 lb 8 oz (40.6 kg)   Affect appropriate Thin chronically ill black female  HEENT: normal Neck supple with no adenopathy JVP normal no bruits no thyromegaly Lungs diffuse rhonchi no wheezing and good diaphragmatic motion Heart:  S1/S2 no murmur, no rub, gallop or click PMI normal Abdomen: benighn, BS positve, no tenderness, no AAA no bruit.  No HSM or HJR Distal pulses intact with no bruits No edema Neuro non-focal Skin warm and dry No muscular weakness    LABORATORY DATA:  EKG:  EKG is ordered today. 09/25/17 SR rate 60 RBBB LAFB   Lab Results  Component Value Date   WBC 8.8 09/14/2016   HGB 10.7 (L) 09/14/2016   HCT 32.7 (L) 09/14/2016   PLT 267.0 09/14/2016   GLUCOSE 141 (H) 09/14/2016   CHOL 129 05/24/2016   TRIG 76 05/24/2016   HDL 65 05/24/2016   LDLCALC 49 05/24/2016   ALT 11 09/14/2016   AST 15 09/14/2016   NA 139 09/14/2016   K 4.0 09/14/2016   CL 94 (L) 09/14/2016   CREATININE 0.60 09/14/2016   BUN 20 09/14/2016   CO2 35 (H) 09/14/2016   TSH 0.87  09/14/2016   INR 1.01 11/08/2011   HGBA1C 6.9 (H) 07/31/2016    BNP (last 3 results) No results for input(s): BNP in the last 8760 hours.  ProBNP (last 3 results) No results for input(s): PROBNP in the last 8760 hours.   Other Studies Reviewed Today:   Assessment/Plan:  1. Chronic Diastolic HF: denies dyspnea. euvolemic on physical  exam - she actually looks malnourished. Will need better BP control to prevent exacerbation.  Would favor a more conservative approach at this time. Decrease lasix To 20 mg labs ok   2. HTN: Better on Lotensin  Daughter will monitor at home.   3. PAF:  She is in sinus.  Anticoagulation not recommended due to unsteadiness and falls.   4. Tobacco use: not discussed today - she does continue to smoke.   5. Probable dementia  6. HLD - on statin -  Lab Results  Component Value Date   LDLCALC 49 05/24/2016     7. Malnourished - boost supplements f/u primary   8. Cough:  Rhonchi on exam f/u CXR since not done last few weeks      Current medicines are reviewed with the patient today.  The patient does not have concerns regarding medicines other than what has been noted above.  The following changes have been made:  See above.  Labs/ tests ordered today include:    Orders Placed This Encounter  Procedures  . DG Chest 2 View  . EKG 12-Lead     Disposition:   FU with me in 6 months    Patient is agreeable to this plan and will call if any problems develop in the interim.   [name], MD

## 2017-09-28 ENCOUNTER — Ambulatory Visit: Payer: Medicare Other

## 2017-10-02 ENCOUNTER — Ambulatory Visit: Payer: Medicare Other

## 2017-10-04 ENCOUNTER — Encounter: Payer: Medicare Other | Admitting: Speech Pathology

## 2017-10-05 ENCOUNTER — Ambulatory Visit (INDEPENDENT_AMBULATORY_CARE_PROVIDER_SITE_OTHER): Payer: Medicare Other | Admitting: Pulmonary Disease

## 2017-10-05 ENCOUNTER — Ambulatory Visit (INDEPENDENT_AMBULATORY_CARE_PROVIDER_SITE_OTHER): Payer: Medicare Other

## 2017-10-05 ENCOUNTER — Encounter: Payer: Self-pay | Admitting: Pulmonary Disease

## 2017-10-05 ENCOUNTER — Other Ambulatory Visit: Payer: Self-pay | Admitting: Adult Health

## 2017-10-05 ENCOUNTER — Other Ambulatory Visit: Payer: Self-pay | Admitting: Pulmonary Disease

## 2017-10-05 VITALS — BP 128/68 | HR 80 | Temp 98.0°F | Ht 62.0 in | Wt 90.4 lb

## 2017-10-05 DIAGNOSIS — R0989 Other specified symptoms and signs involving the circulatory and respiratory systems: Secondary | ICD-10-CM

## 2017-10-05 DIAGNOSIS — E089 Diabetes mellitus due to underlying condition without complications: Secondary | ICD-10-CM

## 2017-10-05 DIAGNOSIS — I255 Ischemic cardiomyopathy: Secondary | ICD-10-CM | POA: Diagnosis not present

## 2017-10-05 DIAGNOSIS — J449 Chronic obstructive pulmonary disease, unspecified: Secondary | ICD-10-CM

## 2017-10-05 DIAGNOSIS — R636 Underweight: Secondary | ICD-10-CM | POA: Diagnosis not present

## 2017-10-05 DIAGNOSIS — J441 Chronic obstructive pulmonary disease with (acute) exacerbation: Secondary | ICD-10-CM

## 2017-10-05 DIAGNOSIS — R059 Cough, unspecified: Secondary | ICD-10-CM

## 2017-10-05 DIAGNOSIS — E785 Hyperlipidemia, unspecified: Secondary | ICD-10-CM

## 2017-10-05 DIAGNOSIS — R05 Cough: Secondary | ICD-10-CM | POA: Diagnosis not present

## 2017-10-05 DIAGNOSIS — R0602 Shortness of breath: Secondary | ICD-10-CM

## 2017-10-05 DIAGNOSIS — I5032 Chronic diastolic (congestive) heart failure: Secondary | ICD-10-CM

## 2017-10-05 DIAGNOSIS — E1169 Type 2 diabetes mellitus with other specified complication: Secondary | ICD-10-CM | POA: Diagnosis not present

## 2017-10-05 DIAGNOSIS — I10 Essential (primary) hypertension: Secondary | ICD-10-CM | POA: Diagnosis not present

## 2017-10-05 MED ORDER — BUDESONIDE-FORMOTEROL FUMARATE 160-4.5 MCG/ACT IN AERO: INHALATION_SPRAY | RESPIRATORY_TRACT | 2 refills | Status: DC

## 2017-10-05 MED ORDER — METHYLPREDNISOLONE ACETATE 80 MG/ML IJ SUSP
80.0000 mg | Freq: Once | INTRAMUSCULAR | Status: AC
  Administered 2017-10-05: 80 mg via INTRAMUSCULAR

## 2017-10-05 MED ORDER — METHYLPREDNISOLONE 4 MG PO TBPK: ORAL_TABLET | ORAL | 0 refills | Status: DC

## 2017-10-05 MED ORDER — LEVOFLOXACIN 500 MG PO TABS: 500.0000 mg | ORAL_TABLET | Freq: Every day | ORAL | 0 refills | Status: DC

## 2017-10-05 NOTE — Patient Instructions (Signed)
Today we updated your med list in our EPIC system...    Continue your current medications the same...  We decided to give her an additional antibiotic-- LEVAQUIN 500mg - take one tab daily til gone...  We gave her a Depo shot today for the inflammation & recommend starting a MEDROL Dosepak in the AM...    Follow the tapering directions on the pack...  Finally she would benefit from the [facility] for the congestion>    I suggest the 600mg  tabs and take one tab 4 times daily w/ extra fluids...   To be absolutely sure there is no underlying pulmonary lesion, infiltrate, pneumonia, etc>    I rec that we check a CT Chest scan to be sure...     We will set this up & call you w/ the report when available...   Rest at home,  lots of fluids, Tylenol as needed.Marland KitchenMarland Kitchen

## 2017-10-05 NOTE — Progress Notes (Signed)
Subjective:     Patient ID: [name], female   DOB: [birth year], 84 y.o.   MRN: [id number]  HPI 84 y/o BF w/ severe COPD/emphysema, multifactorial dyspnea & finally quit smoking after [facility] 07/2016 w/ 65+pack-yr hx, pulmHTN, diastolicCHF, PAF, underweight & FTT; referred by PCP- DrKoirala for pulm eval...   ~  May 27, 2015:  Initial pulmonary consult w/ SN>      66 y/o BF referred by DrCrossley for a pulmonary evaluation due to dyspnea> He saw her yest in his office for f/u sinus drainage & cough, treated w/ ZPak/ then Doxy, he did a CXR showing Emphysema & referred her here;  Her PCP is Dr. Maudie Mercury Shelton/ Dr. Glendale Chard ([facility]);  Pt states that she "got a cold" about 3-4 wks ago & that's when her symptoms started- rattling cough, grey sput, no hemoptysis, incr SOB w/ activity esp rushing/ stairs/ etc but she says ADLs are OK & most stenuous activ is mailbox & back (not exercising);  She is a current everyday smoker> started in early teens, smoked for 70 yrs up to 1ppd max, and cut back to 3 cig/d for the last several months (~65+pack-yr smoking hx);  She states that sh'e in the process of quitting- "I just take a few puffs, I have the discipline";  She denies hx prior lung problems> rare episode of bronchitis requiring antibiotics, no prev pneumonia, no hx TB or known exposure, no prev Hosp for resp illness;  No known hx of asbestos exposure or other toxins;  She has hx sinusitis & prev sinus surg at [facility] in the 80s;  She has signif medical issues including> Underwt w/ BMI=15, HBP, CAD, CHF, HL, DM, etc;  FamHx is neg for resp problems (see below)...    Current Meds>  AlbutHFA prn, Flonase/ Claritin, ASA81, Lanoxin0.125, Coreg3.125Bid, Amlod5, Lotensin40, Lasix20, Lip10, Metform500, Glimep2, Vits...  EXAM shows Afeb, VSS, O2sat=95% on RA at rest;  83#, 5'3"Tall, BMI<15;  HEENT- neg, mallamapti1;  Chest- congested cough, scat rhonchi, no w/r/consolidation;  Heart- RR w/o m/r/g;  Abd-  thin, soft, neg;  Ext- w/o c/c/e; Neuro- nonfocal...  2DEcho 05/01/2010 showed norm LVF w/ EF=60-65% & no regional wall motion abn, Gr1DD, mild MR, +atrial septal aneurysm, incr PAsys=8mHg  CT Angio Chest 09/04/14 in EPIC showed NEG for PE...   [facility] norm heart size, COPD/ hyperinflated/ flattened diaph/ NAD, degen changes in Tspine...  Spirometry 05/27/15 showed FVC=1.38 (66%), FEV1=0.66 (44%), %1sec=48, mid-flows reduced at 23% predicted;  This is c/w severe airflow obstruction & GOLD Stage3 COPD...  Ambulatory Oximetry 05/27/15> O2sat=100% on RA at rest w/ pulse=78/min; she ambulated 2 Laps and stopped due to fatigue, lowest O2sat=98% w/ pulse=114/min... IMP>>    Dyspnea is multifactorial-- due to COPD/emphysema, smoking, heart dis, deconditioning/ sedentary lifestyle/ 84y/o...    COPD/ emphysema    Cigarette smoker, still smoking 3cig/d, 65+pack-yr smoking hx    PulmHTN w/ 2DEcho in 2012 showing PAsys est~410mg    CARDIAC Hx>  followed by DrCherly Hensenor HBP, diastolicCHF, hx AFib    MEDICAL Hx>  follwed by PCP for HBP, HL, DM, FTT/ underwt, etc... PLAN>>    We reviewed her objective data and the Dx of COPD/emphysema (GOLD Stage3); she understands that she must quit all smoking, and rec to start regular inhaler use-- SYMBICORT160-2spBid, INCRUSE-once daily, +OTC MUCINEX60020m-2Bid, +nutritional supplements daily;  She needs regular f/u w/ her PCP to work on med compliance, nutritional support, etc... ROV  recheck in 6wks.  ~  July 21, 2015:  30moROV w/ SN>  BKayleareports that her SOB is improved on the Symbicort160-2spBid, Incruse once daily, ProventilHFA as needed ("my breathing is much better" she says); she is not using the Mucinex and unfortunately still smoking a few cigs per day; she still has a mild cough w/ some thick beige sput production; she denies CP/ tightness/ f/c/s... We reviewed her prev eval/ CXR/ spirometry/ etc...  EXAM shows Afeb, VSS, O2sat=94% on RA at  rest;  84#, 5'3"Tall, BMI<15;  HEENT- neg, mallamapti2;  Chest- congested cough, decrBS, scat rhonchi, no w/r/consolidation;  Heart- RR w/o m/r/g;  Abd- thin, soft, neg;  Ext- w/o c/c/e...  IMP/PLAN>>  She is again advised to quit all smoking! We reviewed med regimen w/ Symbicort160, Incruse, Mucinex, AlbutHFA rescue inhaler, etc;  Reminded to incr exercise program, walk regularly, eat better and gain some weight...  ~  August 15, 2016:  113moOV w/ SN>  BiCalynnaw DrBingham Farms/19/18 w/ sinus congestion & drainage, +SOB, cough, wheezing- still smoking a few; she had the Symbicort but I suspect NOT using regularly, she was using her daughter's Albuterol rescue inhaler;  She desaturated w/ exercise (87% after 1 lap on RA & was started on Home O2, Ceftin, & Pred taper; asked to restart INCRUSE & given a rescue inhaler of her own... She improved on the regimen but continued smoking &  gradually deteriorated after she finished the Pred, stopped the Incruse, & backed off on the Symbicort...  She presented to the ER 07/26/16 w/ worsening SOB found to have a COPD exac, acute on chr hypoxemic resp failure, acute on chr diastolic CHF, along w/ her medical issues of wt loss, DM, malnutrition, and weakness => ADM by [facility] 4/3 - 08/02/16 and Disch for short term rehab; she was treated w/ antibiotics, IV Solumedrol, BiPAP, NEBs, etc;  Disch on Pred taper (off now), NEBs vs Proair prn, SYMBICORT160-2spBid, not on LAMA rx;  Cardiac meds as below;      Dyspnea is multifactorial-- due to COPD/emphysema, smoking, heart dis, deconditioning/ sedentary lifestyle/ 8327/o => multidisciplinary approach supervised by PCP...    COPD/ emphysema-- on Symbicort160-2spBid, Incruse- once daily, O2 24/7 now & we will check ONO...    Cigarette smoker, still [facility]    PulmHTN w/ 2DEcho in 2012 showing PAsys est~4353m    CARDIAC Hx>  followed by DrNCherly Hensenr HBP, diastolicCHF, hx AFib, etc; on  ASA81, Coreg6.25Bid, Amlod5, Lotensin10, Lasix20-2/d, Lanoxin0.125-1/2 daily    Medical Issues> HBP, HL (Lip10), DM (Metform, Glimep), FTT/ physical deconditioning/ poor nutrition; prev followed by DrKCurly Rimow seeing DrPandy, PieJacksonr note of 08/04/16 is reviewed... EXAM shows Afeb, VSS, O2sat=95% on 2L O2;  76#, 5'3"Tall, BMI<15;  HEENT- neg, mallamapti2, tongue coated;  Chest- congested cough, decrBS, scat rhonchi, no w/r/consolidation;  Heart- RR w/o m/r/g;  Abd- thin, soft, neg;  Ext- w/o c/c/e...   CXR 07/26/16 (independently reviewed by me in the PACS system) showed cardiomeg, mild basilar scarring & lingular atx, scoliosis & DDD in Tspine...  CT Angio Chest 07/26/16>  Norm heart size, thor Ao atherosclerosis, NEG for PE; no adenopathy, +centrilob emphysema but otherw clear lungs, small effusions  2DEcho 07/29/16>  Diffuse HK, abn septal motion, reduced LVF w/ EF=40-45%, mild MR, aortic calcif, RVsys~23...  LABS 07/2016>  Chems- Na~132, HCO3~36, BS~269, A1c=6.9, Cr~0.77;  CBC- Hg~11-12, WBC~15=>12K IMP/PLAN>>  Mazey MUST take  all meds as directed (needs admin from Family member care giver) and must commit to NO SMOKING;  We will check ONO in the interim;  Tongue coated- needs attn to oral hygiene, rx Nystatin;  Needs PT/ PT/ visiting nurses, etc... We plan ROV recheck 79mo..  ~  Sep 14, 2016:  140moOV & pulmonary follow up visit> Her PCP is listed as DrKoirala- ?when last seen?  She tells me that she is still NOT SMOKING but "I have urges";  She says her chest & breathing are ok-- denies cough, sput, SOB & CP;  She does have DOE eg w/ stairs at home, which she takes 1 step at a time, sometimes w/ help, & she feels as thopugh she is doing well...  I quizzed her about her meds-- she is NOT using the NEB (ran out of the Duoneb med & never refilled it);  On O2 at 2L/min flow 24/7, Symbicort160-2spBid, Incruse once daily, VentolinHFA prn; Flonase; it is hard to know what  she is really taking & we stressed the importance of no smoking & taking her meds every day as prescribed... we reviewed the ABOVE medical problems as well>>     EXAM shows Afeb, VSS, O2sat=95% on 2L O2;  76#, 5'3"Tall, BMI<15;  HEENT- neg, mallamapti2, tongue coated;  Chest- congested cough, decrBS, scat rhonchi, no w/r/consolidation;  Heart- RR w/o m/r/g;  Abd- thin, soft, neg;  Ext- w/o c/c/e...   LABS 09/14/16>  Chems- ok x HCO3=35, BS=141, (Cr=0.60 & LFTs wnl);  CBC- mild anemia w/ Hg=10.7;  TSH=0.87;  BNP=70...  Overnight oximetry done 08/18/16>  She only recorded for 2H;  O2sat nadir= 74%;  She spent 22 min w/ O2sat<88% IMP/PLAN>>  BiShenickaust commit to staying off the cigs;  For now we discussed using the Symbicort/ Incruse everyday & the VentolinHFA prn (she will leave off the NEBs as she has already stopped this on her own);  Must use her O2 24/7 and of course no smoke;  Medical issues per DrKoirala including- HBP, disatolicCHF, HxPAF, HL, DM, FTT/ physical deconditioning/ poor nutrition.  ~  November 21, 2016:  53m67moV & Bilie reports that she has seen her PCP-Dr KoiDorthy Coolere do not have notes, he is not in epic, pt reports he started an Iron tab), and Cards-DrNishan> seen 09/22/16, note reviewed, Hx HBP, PAFib, DiastolicCHF, & underweight at 80lbs w/ BMI~15; he noted ongoing tobacco abuse;  Pt tells me she quit smoking 07/2016 Hosp;  She is holding NSR & not on Coumadin; her last 2DEcho in 2012 showed EF=60-65% and Gr1DD;  On ECASA81, Lotensin10, CorManchesterBid, Digoxin0.125-1/2tab, Lasix40=> he cut her dose to 34m61m..  Marland KitchenMarland Kitchene is also attended by [facility] telephone outreach 6/28 conversation w/ care giver (daughRamona MillSabra Heckdicated that "things are going great" and pt "hasn't had any problems, feeling good"; having met her goals- [facility] signed off... WE REVIEWED ABOVE PROB LIST...    EXAM shows Afeb, VSS, O2sat=95% on 2L O2;  89#, 5'3"Tall, BMI~16;  HEENT- neg, mallamapti2, tongue coated;  Chest-  congested cough, decrBS, scat rhonchi, no w/r/consolidation;  Heart- RR w/o m/r/g;  Abd- thin, soft, neg;  Ext- w/o c/c/e...  IMP/PLAN>>  Again BillKingsleyreminded to do her NEB Tid followed by the Advair-2spBid & Incruse once daily;  DrNiCherly Hensenmanaging her PAF, Systolic & Diastolic CHF, HBP etc;  DrKoirala manages her HBP, DM, HL, Anemia etc...   ~  February 21, 2017:  3 month ROV & pulmonary recheck> BillBJ's  reports stable overall, she has gained 2# to 90# today;  She notes some dry coughing, SOB is the same (eg-stairs in her split level home), her last cig was 07/2016 hosp;  She has home health aide & getting home PT... We reviewed the following medical problems during today's office visit>    Dyspnea is multifactorial-- due to COPD/emphysema, smoking, heart dis, deconditioning/ sedentary lifestyle/ 84 y/o => multidisciplinary approach supervised by PCP...    COPD/ emphysema-- on Symbicort160-2spBid, Incruse- once daily, & O2 24/7...    Ex-Cigarette smoker, prev smoking 3cig/d prior to 07/2016 [facility] & quit then, 65+pack-yr smoking hx    PulmHTN w/ 2DEcho in 2012 showing PAsys est~29mHg    CARDIAC Hx>  followed by DCherly Hensenfor HBP, sys+diastolicCHF, hx AFib, etc; on ASA81, Coreg6.25Bid, Amlod5, Lotensin10, Lasix20-2/d, Lanoxin0.125-1/2 daily    Medical Issues> HBP, HL (Lip10), DM (Metform, Glimep), FTT/ physical deconditioning/ poor nutrition; prev followed by DCurly Rim now seeing DrPandy, PEgypther note of 08/04/16 is reviewed... EXAM shows Afeb, VSS, O2sat=97% on 2L O2;  90#, 5'3"Tall, BMI~16;  HEENT- neg, mallamapti2, tongue coated;  Chest- congested cough, decrBS, scat rhonchi, no w/r/consolidation;  Heart- RR w/o m/r/g;  Abd- thin, soft, neg;  Ext- w/o c/c/e...  IMP/PLAN>>  Continue Symbicort/ incruse & prn Albut rescue inhaler via aerochanber; need to stay off cigs and incr activity/ exercise...   ~  June 22, 2017:  4241moOV & Leeona reports feeling "fair",  notes she has to rest more at 8579/o & feels more SOB;  There are no interval medical notes in Epic but BiDamaliells me that her PCP DrKoirala did a wellness exam & referred her to ENT for hoarseness & had a lesion in her "voice box" but we do not have records and will call for these reports... She notes min cough/ sputum/ no hemoptysis, and she states that her SOB is actually improved... We reviewed the following medical problems during today's office visit>    Dyspnea is multifactorial-- due to COPD/emphysema, smoking, heart dis, deconditioning/ sedentary lifestyle/ 8570/o => multidisciplinary approach supervised by PCP...    COPD/ emphysema-- on Symbicort160-2spBid & Incruse- once daily, & O2 24/7...    Ex-Cigarette smoker, [facility], 65+pack-yr smoking hx    PulmHTN w/ 2DEcho in 2012 showing PAsys est~4312m    CARDIAC Hx>  followed by DrNCherly Hensenr HBP, sys+diastolicCHF, hx AFib, etc; on ASA81, Coreg6.25Bid, Amlod5, Lotensin10, Lasix20-2/d, Lanoxin0.125-1/2 daily    Medical Issues> HBP, HL (Lip10), DM (Metform, Glimep), FTT/ physical deconditioning/ poor nutrition; prev followed by DrKoirala-[facility]...  EXAM shows Afeb, VSS, O2sat=99% on 2L O2;  90#, 5'2"Tall, BMI~16;  HEENT- neg, mallamapti2, tongue coated;  Chest- congested cough, decrBS, scat rhonchi, no w/r/consolidation;  Heart- RR w/o m/r/g;  Abd- thin, soft, neg;  Ext- w/o c/c/e...  IMP/PLAN>>  Dahlila appears stable on her O2, Symbicort, Incruse; asked to stay current on indicated vaccinations thru her PCP office; asked to stay active in a regular exercise program; call for any breathing issues...    ~  October 05, 2017:  3-41mo46mo & add-on appt request for refractory cough & chest congestion>  When last checked in Feb- Raziah was stable at age 89 o73her O2, Symbicort, Incruse, and encouraged to increase exercise;  She returns today at the request of her PCP- DrKoirala for a cough over the last  month>  Pt is sl confused & daughter augments the hx,  while daugh was out of town pt stayed w/ her son, on return daugh noted pt's cough/ small amt sput, some chest congestion & wheezing; not feeling well, some incr DOE, but denies f/c/s; she was seen by her PCP & given Augmentin x10d & 5d of Pred but daughter reports no better, increased congestion and referred here;  In the interim she was seen by Cards and they checked CXR which was clear- NAD; pt denies CP/ palpit/ dizzy/ edema... We reviewed the following interval medical notes in Epic>  We do not have records from PCP...    She saw ENT- DrMarcellino on 05/30/17>  Dysphonia & age-related vocal fold atrophy, GERD/LPR; note reviewed, ex-smoker quit ~66yr on nasal O2; she was rec to try voice therapy eval & antireflux regimen...     She had series of 10 outpt speech-path treatments ordered by ENT- DrMarcellino & completed 08/2017>  Dysphonia- notes reviewed, family noted improved voice quality & endurance, aspiration precautions, etc...     She saw CARDS-Cherly Hensenon 09/25/17>  Hx HBP, sys+diastolicCHF, PAFib; not on coumadin due to age & underweight, 2DEcho in 4/18 w/ EF=40-45% w/ diffuse HK & abn septal motion, Gr1DD, & RVsys=23; EKG showed NSR, rate60, RBBB & LAFB; They cut her Lasix to 285md & rec continue ASA81, Digox0.125-1/2 tab daily, Coreg6.25Bid, Lotensin10, Lipitor10...  We reviewed the following medical problems during today's office visit>    Dyspnea is multifactorial-- due to COPD/emphysema, smoking, heart dis, deconditioning/ sedentary lifestyle/ 8558/o => multidisciplinary approach supervised by PCP...    COPD/ emphysema-- on Symbicort160-2spBid & Incruse- once daily, & O2 _0 /min 24/7...    Ex-Cigarette smoker, [facility], 65+pack-yr smoking hx    HxPulmHTN w/ 2DEcho in 2012 showing PAsys est~4365m>  Follow up 2DEcho during 4/18 Hosp showed RVsys~65m80m..    CARDIAC Hx>  followed by DrNiCherly Hensen  HBP, systolic+diastolicCHF, hx AFib; on ASA8ZMO29reg6.25Bid, , Lotensin10, Lasix20-d, Lanoxin0.125-1/2 daily    Medical Issues> HBP, HL (Lip10), DM (Metform, Glimep), prev stroke w/ abn MRI, FTT/ physical deconditioning/ poor nutrition; followed by DrKoCurly Rim we do not have notes or updated med list... EXAM shows Afeb, VSS, O2sat=98% on 2L O2;  90#, 5'2"Tall, BMI~16;  HEENT- neg, mallamapti2, tongue coated;  Chest- congested cough, decrBS, scat rhonchi, no w/r/consolidation;  Heart- RR w/o m/r/g;  Abd- thin, soft, neg;  Ext- w/o c/c/e...   CXR 09/25/17 by DrNishan>  Cardiomegaly w/o change, aortic calcif, hyperinflation c/w COPD- clear/ NAD, Tspine degen changes...  No recent LABS to review...   CT Chest done 10/20/17>  Heart at upper lim norm for size, calcif in aorta & coronaries, no adenopathy, mild centrilob emphysema, no airsp dis/ nodules/ masses/ effusion/ etc- NAD... IMP/PLAN>>  Khaleelah's subacute resp exac did not respond to the Augmentin & 5days of Pred from DrKoLydene discussed Rx w/ Levaquin500mg33m7d, Depomedrol80 + a Medrol dosepak, and MUCINEX 600mg 88mw/ water by mouth;  Her daughter is concerned that "there may be something developing" & I sought to reassure her by offering a CT Chest to be sure- they were appreciative of this suggestion and wanted to proceed=> CT ordered, completed on 10/20/17 NEG w/o acute abnormalities...     Past Medical History:  Diagnosis Date  . Acute bronchitis   . Acute on chronic respiratory failure with hypoxia (HCC)  [place] hypertensive renal disease   . CAD (coronary artery disease)   . Cardiomyopathy  non-ischemic 04/2008 EF 45% cardiac MRI no scar  . CHF (congestive heart failure) ([place])   . Chronic sinusitis   . COPD (chronic obstructive pulmonary disease) ([place])   . COPD exacerbation ([place])   . Depression   . Diabetes mellitus without complication, without long-term current use of insulin ([place])   . Dyslipidemia  associated with type 2 diabetes mellitus ([place])   . Dyspnea on exertion    chronic  . Hypercholesterolemia   . Hypertension   . On home oxygen therapy    "3L; 24/7; for the last 2 1/2 weeks; none before that" (07/26/2016)  . Protein-calorie malnutrition, severe ([place])   . Sinus headache    "chronic"  . Stroke [facility]) 1997   "mild"  . Type II diabetes mellitus ([place])     Past Surgical History:  Procedure Laterality Date  . carotid angiogram     2006  . ESOPHAGOGASTRODUODENOSCOPY     with foreign body removal  . NASAL SINUS SURGERY  ~1974;~ 1976  . TONSILLECTOMY     "when I was a chld"    Outpatient Encounter Medications as of 10/05/2017  Medication Sig  . albuterol (VENTOLIN HFA) 108 (90 Base) MCG/ACT inhaler Inhale 2 puffs into the lungs every 6 (six) hours as needed for wheezing or shortness of breath.  Marland Kitchen aspirin EC 81 MG tablet Take 81 mg by mouth daily.  Marland Kitchen atorvastatin (LIPITOR) 10 MG tablet TAKE 1 TABLET(10 MG) BY MOUTH DAILY AT 6 PM  . benazepril (LOTENSIN) 10 MG tablet Take 10 mg by mouth as directed.  . budesonide-formoterol (SYMBICORT) 160-4.5 MCG/ACT inhaler INHALE 2 PUFFS INTO THE LUNGS TWICE DAILY  . carvedilol (COREG) 6.25 MG tablet Take 1 tablet (6.25 mg total) by mouth 2 (two) times daily with a meal.  . DIGOX 125 MCG tablet TAKE 1/2 TABLET(0.063 MG) BY MOUTH DAILY  . ferrous sulfate 325 (65 FE) MG tablet Take 325 mg by mouth daily with breakfast.  . fluticasone (FLONASE) 50 MCG/ACT nasal spray Place 1 spray into both nostrils daily as needed for allergies.  . furosemide (LASIX) 20 MG tablet Take 1 tablet (20 mg total) by mouth daily.  Marland Kitchen guaiFENesin (MUCINEX) 600 MG 12 hr tablet Take by mouth 4 (four) times daily. With fluids  . insulin aspart (NOVOLOG) 100 UNIT/ML injection Inject into the skin daily as needed for high blood sugar (For CBG >300). As needed  . metFORMIN (GLUCOPHAGE-XR) 500 MG 24 hr tablet Take 1,000 mg by mouth 2 (two) times daily.   . Multiple Vitamin  (MV-ONE PO) Take 1 tablet by mouth daily.  Marland Kitchen Spacer/Aero-Holding Chambers (AEROCHAMBER MV) inhaler Use as instructed  . umeclidinium bromide (INCRUSE ELLIPTA) 62.5 MCG/INH AEPB Inhale 1 puff into the lungs daily.  Marland Kitchen UNABLE TO FIND Med Name: Med pass 120 mL by mouth 3 times daily  . [DISCONTINUED] amoxicillin-clavulanate (AUGMENTIN) 875-125 MG tablet Take 1 tablet by mouth as directed.  . [DISCONTINUED] budesonide-formoterol (SYMBICORT) 160-4.5 MCG/ACT inhaler INHALE 2 PUFFS INTO THE LUNGS TWICE DAILY  . levofloxacin (LEVAQUIN) 500 MG tablet Take 1 tablet (500 mg total) by mouth daily.  . methylPREDNISolone (MEDROL DOSEPAK) 4 MG TBPK tablet 6 day pak-take as directed   No facility-administered encounter medications on file as of 10/05/2017.     No Known Allergies   Immunization History  Administered Date(s) Administered  . Influenza Split 01/23/2017  . Influenza,inj,Quad PF,6+ Mos 01/24/2015, 01/24/2016  . Pneumococcal-Unspecified 12/25/2014  NEED TO CHECK w/ DrSanders/ Koirala/ Pandy about her  IMMUNIZ HISTORY...   Current Medications, Allergies, Past Medical History, Past Surgical History, Family History, and Social History were reviewed in [employer] record.   Review of Systems             All symptoms NEG except where BOLDED >>  Constitutional:  F/C/S, fatigue, anorexia, unexpected weight change. HEENT:  HA, visual changes, hearing loss, earache, nasal symptoms, sore throat, mouth sores, hoarseness. Resp:  cough, sputum, hemoptysis; SOB, tightness, wheezing. Cardio:  CP, palpit, DOE, orthopnea, edema. GI:  N/V/D/C, blood in stool; reflux, abd pain, distention, gas. GU:  dysuria, freq, urgency, hematuria, flank pain, voiding difficulty. MS:  joint pain, swelling, tenderness, decr ROM; neck pain, back pain, etc. Neuro:  HA, tremors, seizures, dizziness, syncope, weakness, numbness, gait abn. Skin:  suspicious lesions or skin rash. Heme:  adenopathy,  bruising, bleeding. Psyche:  confusion, agitation, sleep disturbance, hallucinations, anxiety, depression suicidal.   Objective:   Physical Exam       Vital Signs:  Reviewed...   General:  WD, underweight, 84 y/o BF in NAD; alert & oriented; pleasant & cooperative... HEENT:  Froid/AT; Conjunctiva- pink, Sclera- nonicteric, EOM-wnl, PERRLA, Fundi-benign; EACs-clear, TMs-wnl; NOSE-sl red; THROAT-clear & wnl.  Neck:  Supple w/ decr ROM; no JVD; normal carotid impulses w/o bruits; no thyromegaly or nodules palpated; no lymphadenopathy.  Chest:  DecrBS, clear without wheezes, rales, or rhonchi heard. Heart:  Regular Rhythm; norm S1 & S2 without murmurs, rubs, or gallops detected. Abdomen:  Soft & nontender- no guarding or rebound; normal bowel sounds; no organomegaly or masses palpated. Ext:  decrROM; without deformities +arthritic changes; no varicose veins, +venous insuffic, no edema;  Pulses intact w/o bruits. Neuro:  No focal neuro deficits Derm:  No lesions noted; no rash etc. Lymph:  No cervical, supraclavicular, axillary, or inguinal adenopathy palpated.   Assessment:      IMP>>    Dyspnea is multifactorial-- due to COPD/emphysema, smoking, heart dis, deconditioning/ sedentary lifestyle/ 84 y/o...    COPD/ emphysema    Cigarette smoker, still smoking 3cig/d, 65+pack-yr smoking hx    PulmHTN w/ 2DEcho in 2012 showing PAsys est~27mHg    CARDIAC Hx>  followed by DCherly Hensenfor HBP, diastolicCHF, hx AFib    MEDICAL Hx>  follwed by PCP for HBP, HL, DM, FTT/ underwt, etc...  PLAN>> 05/27/15>   We reviewed her objective data and the Dx of COPD/emphysema (GOLD Stage3); she understands that she must quit all smoking, and rec to start regular inhaler use-- SYMBICORT160-2spBid, INCRUSE-once daily, +OTC MUCINEX6072m1-2Bid, +nutritional supplements daily;  She needs regular f/u w/ her PCP to work on med compliance, nutritional support, etc... ROV recheck in 6wks. 07/21/15>   She is again advised to  quit all smoking! We reviewed med regimen w/ Symbicort160, Incruse, Mucinex, AlbutHFA rescue inhaler, etc;  Reminded to incr exercise program, walk regularly, eat better and gain some weight... 08/15/16>   Marvina MUST take all meds as directed (needs admin from Family member care giver) and must commit to NO SMOKING;  We will check ONO in the interim;  Tongue coated- needs attn to oral hygiene, rx Nystatin;  Needs PT/ PT/ visiting nurses, etc... We plan ROV recheck 76m80mo 09/15/26>   Kelci must commit to staying off the cigs;  For now we discussed using the Symbicort/ Incruse everyday & the VentolinHFA prn (she will leave off the NEBs as she has already stopped this on her own);  Must use her O2 24/7 and of course no smoke;  Medical issues per DrKoirala including- HBP, disatolicCHF, HxPAF, HL, DM, FTT/ physical deconditioning/ poor nutrition 11/21/16>   Again Ysenia is reminded to do her NEB Tid followed by the Advair-2spBid & Incruse once daily;  Cherly Hensen is managing her PAF, Systolic & Diastolic CHF, HBP etc;  DrKoirala manages her HBP, DM, HL, Anemia etc 02/21/17>  Continue Symbicort/ incruse & prn Albut rescue inhaler via aerochanber; need to stay off cigs and incr activity/ exercise. 06/22/17>   Edith appears stable on her O2, Symbicort, Incruse; asked to stay current on indicated vaccinations thru her PCP office; asked to stay active in a regular exercise program; call for any breathing issues...  10/05/17>   Bitha's subacute resp exac did not respond to the Augmentin & 5days of Pred from [place];  We discussed Rx w/ Levaquin570m/d x7d, Depomedrol80 + a Medrol dosepak, and MUCINEX 6083mQid w/ water by mouth;  Her daughter is concerned that "there may be something developing" & I sought to reassure her by offering a CT Chest to be sure- they were appreciative of this suggestion and wanted to proceed=> CT ordered, completed on 10/20/17 NEG w/o acute abnormalities.   Plan:     Patient's Medications   New Prescriptions   LEVOFLOXACIN (LEVAQUIN) 500 MG TABLET    Take 1 tablet (500 mg total) by mouth daily.   METHYLPREDNISOLONE (MEDROL DOSEPAK) 4 MG TBPK TABLET    6 day pak-take as directed  Previous Medications   ALBUTEROL (VENTOLIN HFA) 108 (90 BASE) MCG/ACT INHALER    Inhale 2 puffs into the lungs every 6 (six) hours as needed for wheezing or shortness of breath.   ASPIRIN EC 81 MG TABLET    Take 81 mg by mouth daily.   ATORVASTATIN (LIPITOR) 10 MG TABLET    TAKE 1 TABLET(10 MG) BY MOUTH DAILY AT 6 PM   BENAZEPRIL (LOTENSIN) 10 MG TABLET    Take 10 mg by mouth as directed.   CARVEDILOL (COREG) 6.25 MG TABLET    Take 1 tablet (6.25 mg total) by mouth 2 (two) times daily with a meal.   DIGOX 125 MCG TABLET    TAKE 1/2 TABLET(0.063 MG) BY MOUTH DAILY   FERROUS SULFATE 325 (65 FE) MG TABLET    Take 325 mg by mouth daily with breakfast.   FLUTICASONE (FLONASE) 50 MCG/ACT NASAL SPRAY    Place 1 spray into both nostrils daily as needed for allergies.   FUROSEMIDE (LASIX) 20 MG TABLET    Take 1 tablet (20 mg total) by mouth daily.   GUAIFENESIN (MUCINEX) 600 MG 12 HR TABLET    Take by mouth 4 (four) times daily. With fluids   INSULIN ASPART (NOVOLOG) 100 UNIT/ML INJECTION    Inject into the skin daily as needed for high blood sugar (For CBG >300). As needed   METFORMIN (GLUCOPHAGE-XR) 500 MG 24 HR TABLET    Take 1,000 mg by mouth 2 (two) times daily.    MULTIPLE VITAMIN (MV-ONE PO)    Take 1 tablet by mouth daily.   SPACER/AERO-HOLDING CHAMBERS (AEROCHAMBER MV) INHALER    Use as instructed   UMECLIDINIUM BROMIDE (INCRUSE ELLIPTA) 62.5 MCG/INH AEPB    Inhale 1 puff into the lungs daily.   UNABLE TO FIND    Med Name: Med pass 120 mL by mouth 3 times daily  Modified Medications   Modified Medication Previous Medication   BUDESONIDE-FORMOTEROL (SYMBICORT) 160-4.5 MCG/ACT INHALER budesonide-formoterol (SYMBICORT) 160-4.5 MCG/ACT inhaler      INHALE 2 PUFFS  INTO THE LUNGS TWICE DAILY    INHALE 2 PUFFS  INTO THE LUNGS TWICE DAILY   INCRUSE ELLIPTA 62.5 MCG/INH AEPB umeclidinium bromide (INCRUSE ELLIPTA) 62.5 MCG/INH AEPB      INHALE 1 PUFF INTO THE LUNGS DAILY    Inhale 1 puff into the lungs daily.  Discontinued Medications   AMOXICILLIN-CLAVULANATE (AUGMENTIN) 875-125 MG TABLET    Take 1 tablet by mouth as directed.

## 2017-10-09 ENCOUNTER — Other Ambulatory Visit: Payer: Self-pay | Admitting: Cardiovascular Disease

## 2017-10-18 ENCOUNTER — Ambulatory Visit (INDEPENDENT_AMBULATORY_CARE_PROVIDER_SITE_OTHER)
Admission: RE | Admit: 2017-10-18 | Discharge: 2017-10-18 | Disposition: A | Discharge status: Home / Self Care | Payer: Medicare Other | Source: Ambulatory Visit | Attending: Pulmonary Disease | Admitting: Pulmonary Disease

## 2017-10-18 DIAGNOSIS — R0602 Shortness of breath: Secondary | ICD-10-CM

## 2017-10-18 DIAGNOSIS — R05 Cough: Secondary | ICD-10-CM | POA: Diagnosis not present

## 2017-10-18 DIAGNOSIS — J441 Chronic obstructive pulmonary disease with (acute) exacerbation: Secondary | ICD-10-CM | POA: Diagnosis not present

## 2017-10-18 DIAGNOSIS — R0989 Other specified symptoms and signs involving the circulatory and respiratory systems: Secondary | ICD-10-CM | POA: Diagnosis not present

## 2017-10-18 DIAGNOSIS — R059 Cough, unspecified: Secondary | ICD-10-CM

## 2017-10-18 DIAGNOSIS — J439 Emphysema, unspecified: Secondary | ICD-10-CM | POA: Diagnosis not present

## 2017-10-20 ENCOUNTER — Other Ambulatory Visit: Payer: Self-pay | Admitting: Cardiovascular Disease

## 2017-10-20 ENCOUNTER — Other Ambulatory Visit: Payer: Self-pay | Admitting: Pulmonary Disease

## 2017-10-20 MED ORDER — ALBUTEROL SULFATE HFA 108 (90 BASE) MCG/ACT IN AERS: 1.0000 | INHALATION_SPRAY | Freq: Four times a day (QID) | RESPIRATORY_TRACT | 5 refills | Status: DC | PRN

## 2017-10-29 ENCOUNTER — Other Ambulatory Visit: Payer: Self-pay | Admitting: Pulmonary Disease

## 2017-11-02 ENCOUNTER — Ambulatory Visit: Payer: Medicare Other | Attending: Otolaryngology | Admitting: Speech Pathology

## 2017-11-02 DIAGNOSIS — R498 Other voice and resonance disorders: Secondary | ICD-10-CM | POA: Insufficient documentation

## 2017-11-02 NOTE — Therapy (Signed)
   SPEECH THERAPY DISCHARGE SUMMARY  Pt returned with daughter reporting vocal quality has continued to improve, particularly with resolution of respiratory exacerbation. CXR and CT Chest were both without acute findings. Daughter reports they continue to work on louder speech at home and requests reprint of speech HEP. She continues to cue pt occasionally for "squeaky voice" but reports significant improvement. Pt and daughter both report they are pleased with current functional level and feel they can continue to practice at home. Would like to be d/c from ST at this time. Education completed and pt d/c today.   Visits from Start of Care: 9  Current functional level related to goals / functional outcomes: Pt/daughter report improvement in pt's vocal quality and intensity. Pt continues to require cues occasionally for louder speech. See goals below.   Remaining deficits: Pt continues to demonstrate decreased vocal intensity, quality intermittently, which may worsen with respiratory exacerbations.   Education / Equipment: HEP provided  Plan: Patient agrees to discharge.  Patient goals were partially met. Patient is being discharged due to being pleased with the current functional level.  ?????         SLP Short Term Goals - 11/02/17 1627      SLP SHORT TERM GOAL #1   Title  Pt will complete HEP for vocal fold adduction with usual min A over 3 sessions.    Status  Achieved      SLP SHORT TERM GOAL #2   Title  pt will respond with at least 20 sentences average low-70s dB with occasional min A over 2 sessions    Status  Achieved      SLP [place] #3   Title  pt will demo [facility] vocal quality, endurance in >80% of 5 minutes simple conversation    Status  Achieved       SLP Long Term Goals - 11/02/17 1627      SLP LONG TERM GOAL #1   Title  pt will demo HEP for vocal fold adduction with occasional min A over 3 sessions     Baseline  08/28/17    Time  2    Period  Weeks    Status  Partially Met      SLP LONG TERM GOAL #2   Title  pt will talk in 15 min simple conversation with WFL loudness and vocal quality over three sessions with occasional min A from communication partner    Time  2    Period  Weeks    Status  Partially Met      SLP LONG TERM GOAL #3   Title  Pt/daughter will report improved vocal quality and endurance outside ST room over 3 visits    Baseline  09/04/17    Time  2    Period  Weeks    Status  Partially [place], [place], [place] [address] [place], [place], [address] Phone: [phone]   Fax:  [phone]  Patient Details  Name: [name] MRN: [id number] Date of Birth: 1930/03/21 Referring Provider:  Lujean Amel, MD  Encounter Date: 11/02/2017   Aliene Altes 11/02/2017, 4:52 PM  [place] [address] [place], [place], [address] Phone: [phone]   Fax:  [phone]

## 2017-11-02 NOTE — Patient Instructions (Signed)
1)Breathe in, thenPUSH Bear down against a chair while saying /a/ 5 times, with a lot of effort. Don't hold your breath.  2) Breathe in, thenPULLup of the seat of a chair with both hands while saying /a/ 5 times with a lot ofeffort. Don't hold your breath.    3)Read aloud from your list of 10 phrases in your loud, good quality voice.   1.Can you turn the TV on?  2.Molly, get out!  3.What will I wear today?  4.  What time is dinner?  5. Who's on the phone?  6. Are these clothes clean?  7. When is the game?  8. Good morning!  9. Hello, this is Bille!  10. How are you feeling?     4) Read out LOUD using additional tasks provided by your therapist (handouts)     My mom drove me to school fifteen minutes late on Tuesday.  The girl wore her hair in two braids, tied with two blue bows.  The mouse was so hungry he ran across the kitchen floor without even looking for humans.  The tape got stuck on my lips so I couldn't talk anymore.  The door slammed down on my hand and I screamed like a little baby.  My shoes are blue with yellow stripes and green stars on the front.  The mailbox was bent and broken and looked like someone had knocked it over on purpose.  I was so thirsty I couldn't wait to get a drink of water.  I found a gold coin on the playground after school today.  The chocolate chip cookies smelled so good that I ate one without asking.  My bandaid wasn't sticky any more so it fell off on the way to school.  He had a sore throat so I gave him my bottle of water and told him to keep it.  The church was white and brown and looked very old.  I was so scared to go to a monster movie but my dad said he would sit with me so we went last night.  Your mom is so nice she gave me a ride home today.  I fell in the mud when I was walking home from school today.  This dinner is so delicious I can't stop  eating.  The school principal was so mean that all the children were scared of him.  I went to the dentist the other day and he let me pick a prize out of the prize box.  The box was small and wrapped in paper with tiny silver and red glitter dots.  My dad is so funny that he told us jokes all night long and we never fell asleep.  The camping trip was so awesome that I didn't want to come home.  Are you going to have a blue birthday cake for your next birthday?  How did you know that I was going to have a peanut butter sandwich for lunch?  That boy is so mean that he doesn't care if a door slams in your face or if he cuts in line.  The moms and dads all sat around drinking coffee and eating donuts.  My mom made a milkshake with frozen bananas and chocolate sauce.  My pen broke and leaked blue ink all over my new dress.  I got my haircut today and they did it way too short.  My pet turtle, Rosanne Ashing, got out of his cage and I could not  find him anywhere.

## 2017-11-03 ENCOUNTER — Ambulatory Visit: Payer: Medicare Other | Admitting: Speech Pathology

## 2017-11-07 ENCOUNTER — Encounter: Payer: Medicare Other | Admitting: Speech Pathology

## 2017-12-20 ENCOUNTER — Ambulatory Visit (INDEPENDENT_AMBULATORY_CARE_PROVIDER_SITE_OTHER): Payer: Medicare Other | Admitting: Pulmonary Disease

## 2017-12-20 ENCOUNTER — Encounter: Payer: Self-pay | Admitting: Pulmonary Disease

## 2017-12-20 VITALS — BP 128/66 | HR 62 | Temp 97.7°F | Ht 62.0 in | Wt 89.4 lb

## 2017-12-20 DIAGNOSIS — E1169 Type 2 diabetes mellitus with other specified complication: Secondary | ICD-10-CM | POA: Diagnosis not present

## 2017-12-20 DIAGNOSIS — I255 Ischemic cardiomyopathy: Secondary | ICD-10-CM

## 2017-12-20 DIAGNOSIS — J449 Chronic obstructive pulmonary disease, unspecified: Secondary | ICD-10-CM

## 2017-12-20 DIAGNOSIS — I10 Essential (primary) hypertension: Secondary | ICD-10-CM

## 2017-12-20 DIAGNOSIS — I5032 Chronic diastolic (congestive) heart failure: Secondary | ICD-10-CM

## 2017-12-20 DIAGNOSIS — E089 Diabetes mellitus due to underlying condition without complications: Secondary | ICD-10-CM | POA: Diagnosis not present

## 2017-12-20 DIAGNOSIS — E785 Hyperlipidemia, unspecified: Secondary | ICD-10-CM | POA: Diagnosis not present

## 2017-12-20 DIAGNOSIS — R636 Underweight: Secondary | ICD-10-CM

## 2017-12-20 NOTE — Patient Instructions (Signed)
Today we updated your med list in our EPIC system...    Continue your current medications the same...  Continue the SYMBICORT- 2 sprays twice daily...    And the INCRUSE once daily...  Continue the nutritional supplements like BOOST- betw meals and in the eve...  Stay as active as [possible...    But BE CAREFUL- no falling allowed!  Call for any questions or if I can be of service in any way...  We will arrange for a follow up visit w/ one of my young partners in Jan 2020.Marland KitchenMarland Kitchen

## 2017-12-20 NOTE — Progress Notes (Addendum)
Subjective:     Patient ID: [name], female   DOB: Dec 25, 1929, 84 y.o.   MRN: [id number]  HPI 84 y/o BF w/ severe COPD/emphysema, multifactorial dyspnea & finally quit smoking after [facility] 07/2016 w/ 65+pack-yr hx, pulmHTN, diastolicCHF, PAF, underweight & FTT; referred by PCP- [name] for pulm eval...   ~  May 27, 2015:  Initial pulmonary consult w/ SN>      19 y/o BF referred by [name] for a pulmonary evaluation due to dyspnea> He saw her yest in his office for f/u sinus drainage & cough, treated w/ ZPak/ then Doxy, he did a CXR showing Emphysema & referred her here;  Her PCP is [name]/ [name] ([facility]);  Pt states that she "got a cold" about 3-4 wks ago & that's when her symptoms started- rattling cough, grey sput, no hemoptysis, incr SOB w/ activity esp rushing/ stairs/ etc but she says ADLs are OK & most stenuous activ is mailbox & back (not exercising);  She is a current everyday smoker> started in early teens, smoked for 70 yrs up to 1ppd max, and cut back to 3 cig/d for the last several months (~65+pack-yr smoking hx);  She states that sh'e in the process of quitting- "I just take a few puffs, I have the discipline";  She denies hx prior lung problems> rare episode of bronchitis requiring antibiotics, no prev pneumonia, no hx TB or known exposure, no prev Hosp for resp illness;  No known hx of asbestos exposure or other toxins;  She has hx sinusitis & prev sinus surg at [facility] in the 80s;  She has signif medical issues including> Underwt w/ BMI=15, HBP, CAD, CHF, HL, DM, etc;  FamHx is neg for resp problems (see below)...    Current Meds>  AlbutHFA prn, Flonase/ Claritin, ASA81, Lanoxin0.125, Coreg3.125Bid, Amlod5, Lotensin40, Lasix20, Lip10, Metform500, Glimep2, Vits...  EXAM shows Afeb, VSS, O2sat=95% on RA at rest;  83#, 5'3"Tall, BMI<15;  HEENT- neg, mallamapti1;  Chest- congested cough, scat rhonchi, no w/r/consolidation;  Heart- RR w/o m/r/g;  Abd-  thin, soft, neg;  Ext- w/o c/c/e; Neuro- nonfocal...  2DEcho 05/01/2010 showed norm LVF w/ EF=60-65% & no regional wall motion abn, Gr1DD, mild MR, +atrial septal aneurysm, incr PAsys=88mHg  CT Angio Chest 09/04/14 in EPIC showed NEG for PE...   [facility] showed norm heart size, COPD/ hyperinflated/ flattened diaph/ NAD, degen changes in Tspine...  Spirometry 05/27/15 showed FVC=1.38 (66%), FEV1=0.66 (44%), %1sec=48, mid-flows reduced at 23% predicted;  This is c/w severe airflow obstruction & GOLD Stage3 COPD...  Ambulatory Oximetry 05/27/15> O2sat=100% on RA at rest w/ pulse=78/min; she ambulated 2 Laps and stopped due to fatigue, lowest O2sat=98% w/ pulse=114/min... IMP>>    Dyspnea is multifactorial-- due to COPD/emphysema, smoking, heart dis, deconditioning/ sedentary lifestyle/ 84y/o...    COPD/ emphysema    Cigarette smoker, still smoking 3cig/d, 65+pack-yr smoking hx    PulmHTN w/ 2DEcho in 2012 showing PAsys est~437mg    CARDIAC Hx>  followed by Debra Hensenor HBP, diastolicCHF, hx AFib    MEDICAL Hx>  follwed by PCP for HBP, HL, DM, FTT/ underwt, etc... PLAN>>    We reviewed her objective data and the Dx of COPD/emphysema (GOLD Stage3); she understands that she must quit all smoking, and rec to start regular inhaler use-- SYMBICORT160-2spBid, INCRUSE-once daily, +OTC MUCINEX60033m-2Bid, +nutritional supplements daily;  She needs regular f/u w/ her PCP to work on med compliance, nutritional support, etc... ROV  recheck in 6wks.  ~  July 21, 2015:  30moROV w/ SN>  BKayleareports that her SOB is improved on the Symbicort160-2spBid, Incruse once daily, ProventilHFA as needed ("my breathing is much better" she says); she is not using the Mucinex and unfortunately still smoking a few cigs per day; she still has a mild cough w/ some thick beige sput production; she denies CP/ tightness/ f/c/s... We reviewed her prev eval/ CXR/ spirometry/ etc...  EXAM shows Afeb, VSS, O2sat=94% on RA at  rest;  84#, 5'3"Tall, BMI<15;  HEENT- neg, mallamapti2;  Chest- congested cough, decrBS, scat rhonchi, no w/r/consolidation;  Heart- RR w/o m/r/g;  Abd- thin, soft, neg;  Ext- w/o c/c/e...  IMP/PLAN>>  She is again advised to quit all smoking! We reviewed med regimen w/ Symbicort160, Incruse, Mucinex, AlbutHFA rescue inhaler, etc;  Reminded to incr exercise program, walk regularly, eat better and gain some weight...  ~  August 15, 2016:  113moOV w/ SN>  BiCalynnaw [name]/19/18 w/ sinus congestion & drainage, +SOB, cough, wheezing- still smoking a few; she had the Symbicort but I suspect NOT using regularly, she was using her daughter's Albuterol rescue inhaler;  She desaturated w/ exercise (87% after 1 lap on RA & was started on Home O2, Ceftin, & Pred taper; asked to restart INCRUSE & given a rescue inhaler of her own... She improved on the regimen but continued smoking &  gradually deteriorated after she finished the Pred, stopped the Incruse, & backed off on the Symbicort...  She presented to the ER 07/26/16 w/ worsening SOB found to have a COPD exac, acute on chr hypoxemic resp failure, acute on chr diastolic CHF, along w/ her medical issues of wt loss, DM, malnutrition, and weakness => ADM by [facility] 4/3 - 08/02/16 and Dis[facility] for short term rehab; she was treated w/ antibiotics, IV Solumedrol, BiPAP, NEBs, etc;  Disch on Pred taper (off now), NEBs vs Proair prn, SYMBICORT160-2spBid, not on LAMA rx;  Cardiac meds as below;      Dyspnea is multifactorial-- due to COPD/emphysema, smoking, heart dis, deconditioning/ sedentary lifestyle/ 8327/o => multidisciplinary approach supervised by PCP...    COPD/ emphysema-- on Symbicort160-2spBid, Incruse- once daily, O2 24/7 now & we will check ONO...    Cigarette smoker, still [facility]    PulmHTN w/ 2DEcho in 2012 showing PAsys est~4353m    CARDIAC Hx>  followed by [name] HBP, diastolicCHF, hx AFib, etc; on  ASA81, Coreg6.25Bid, Amlod5, Lotensin10, Lasix20-2/d, Lanoxin0.125-1/2 daily    Medical Issues> HBP, HL (Lip10), DM (Metform, Glimep), FTT/ physical deconditioning/ poor nutrition; prev followed by DrKCurly Rimow seeing DrPandy, PieJacksonr note of 08/04/16 is reviewed... EXAM shows Afeb, VSS, O2sat=95% on 2L O2;  76#, 5'3"Tall, BMI<15;  HEENT- neg, mallamapti2, tongue coated;  Chest- congested cough, decrBS, scat rhonchi, no w/r/consolidation;  Heart- RR w/o m/r/g;  Abd- thin, soft, neg;  Ext- w/o c/c/e...   CXR 07/26/16 (independently reviewed by me in the PACS system) showed cardiomeg, mild basilar scarring & lingular atx, scoliosis & DDD in Tspine...  CT Angio Chest 07/26/16>  Norm heart size, thor Ao atherosclerosis, NEG for PE; no adenopathy, +centrilob emphysema but otherw clear lungs, small effusions  2DEcho 07/29/16>  Diffuse HK, abn septal motion, reduced LVF w/ EF=40-45%, mild MR, aortic calcif, RVsys~23...  LABS 07/2016>  Chems- Na~132, HCO3~36, BS~269, A1c=6.9, Cr~0.77;  CBC- Hg~11-12, WBC~15=>12K IMP/PLAN>>  Glennda MUST take  all meds as directed (needs admin from Family member care giver) and must commit to NO SMOKING;  We will check ONO in the interim;  Tongue coated- needs attn to oral hygiene, rx Nystatin;  Needs PT/ PT/ visiting nurses, etc... We plan ROV recheck 79mo..  ~  Sep 14, 2016:  140moOV & pulmonary follow up visit> Her PCP is listed as [name]- ?when last seen?  She tells me that she is still NOT SMOKING but "I have urges";  She says her chest & breathing are ok-- denies cough, sput, SOB & CP;  She does have DOE eg w/ stairs at home, which she takes 1 step at a time, sometimes w/ help, & she feels as thopugh she is doing well...  I quizzed her about her meds-- she is NOT using the NEB (ran out of the Duoneb med & never refilled it);  On O2 at 2L/min flow 24/7, Symbicort160-2spBid, Incruse once daily, VentolinHFA prn; Flonase; it is hard to know what  she is really taking & we stressed the importance of no smoking & taking her meds every day as prescribed... we reviewed the ABOVE medical problems as well>>     EXAM shows Afeb, VSS, O2sat=95% on 2L O2;  76#, 5'3"Tall, BMI<15;  HEENT- neg, mallamapti2, tongue coated;  Chest- congested cough, decrBS, scat rhonchi, no w/r/consolidation;  Heart- RR w/o m/r/g;  Abd- thin, soft, neg;  Ext- w/o c/c/e...   LABS 09/14/16>  Chems- ok x HCO3=35, BS=141, (Cr=0.60 & LFTs wnl);  CBC- mild anemia w/ Hg=10.7;  TSH=0.87;  BNP=70...  Overnight oximetry done 08/18/16>  She only recorded for 2H;  O2sat nadir= 74%;  She spent 22 min w/ O2sat<88% IMP/PLAN>>  BiShenickaust commit to staying off the cigs;  For now we discussed using the Symbicort/ Incruse everyday & the VentolinHFA prn (she will leave off the NEBs as she has already stopped this on her own);  Must use her O2 24/7 and of course no smoke;  Medical issues per [name] including- HBP, disatolicCHF, HxPAF, HL, DM, FTT/ physical deconditioning/ poor nutrition.  ~  November 21, 2016:  53m67moV & Bilie reports that she has seen her PCP-Dr KoiDorthy Coolere do not have notes, he is not in epic, pt reports he started an Iron tab), and Cards-DrNishan> seen 09/22/16, note reviewed, Hx HBP, PAFib, DiastolicCHF, & underweight at 80lbs w/ BMI~15; he noted ongoing tobacco abuse;  Pt tells me she quit smoking 07/2016 Hosp;  She is holding NSR & not on Coumadin; her last 2DEcho in 2012 showed EF=60-65% and Gr1DD;  On ECASA81, Lotensin10, CorManchesterBid, Digoxin0.125-1/2tab, Lasix40=> he cut her dose to 34m61m..  Debra KitchenMarland Hartman is also attended by [facility] telephone outreach 6/28 conversation w/ care giver (daughRamona MillSabra Heckdicated that "things are going great" and pt "hasn't had any problems, feeling good"; having met her goals- [facility] signed off... WE REVIEWED ABOVE PROB LIST...    EXAM shows Afeb, VSS, O2sat=95% on 2L O2;  89#, 5'3"Tall, BMI~16;  HEENT- neg, mallamapti2, tongue coated;  Chest-  congested cough, decrBS, scat rhonchi, no w/r/consolidation;  Heart- RR w/o m/r/g;  Abd- thin, soft, neg;  Ext- w/o c/c/e...  IMP/PLAN>>  Again BillKingsleyreminded to do her NEB Tid followed by the Advair-2spBid & Incruse once daily;  DrNiCherly Hensenmanaging her PAF, Systolic & Diastolic CHF, HBP etc;  [name] manages her HBP, DM, HL, Anemia etc...   ~  February 21, 2017:  3 month ROV & pulmonary recheck> BillBJ's  reports stable overall, she has gained 2# to 90# today;  She notes some dry coughing, SOB is the same (eg-stairs in her split level home), her last cig was 07/2016 hosp;  She has home health aide & getting home PT... We reviewed the following medical problems during today's office visit>    Dyspnea is multifactorial-- due to COPD/emphysema, smoking, heart dis, deconditioning/ sedentary lifestyle/ 84 y/o => multidisciplinary approach supervised by PCP...    COPD/ emphysema-- on Symbicort160-2spBid, Incruse- once daily, & O2 24/7...    Ex-Cigarette smoker, prev smoking 3cig/d prior to 07/2016 [facility] & quit then, 65+pack-yr smoking hx    PulmHTN w/ 2DEcho in 2012 showing PAsys est~23mHg    CARDIAC Hx>  followed by DCherly Hensenfor HBP, sys+diastolicCHF, hx AFib, etc; on ASA81, Coreg6.25Bid, Amlod5, Lotensin10, Lasix20-2/d, Lanoxin0.125-1/2 daily    Medical Issues> HBP, HL (Lip10), DM (Metform, Glimep), FTT/ physical deconditioning/ poor nutrition; prev followed by DCurly Rim now seeing DrPandy, PArcadiaher note of 08/04/16 is reviewed... EXAM shows Afeb, VSS, O2sat=97% on 2L O2;  90#, 5'3"Tall, BMI~16;  HEENT- neg, mallamapti2, tongue coated;  Chest- congested cough, decrBS, scat rhonchi, no w/r/consolidation;  Heart- RR w/o m/r/g;  Abd- thin, soft, neg;  Ext- w/o c/c/e...  IMP/PLAN>>  Continue Symbicort/ incruse & prn Albut rescue inhaler via aerochanber; need to stay off cigs and incr activity/ exercise...   ~  June 22, 2017:  4216moOV & [name] reports feeling "fair",  notes she has to rest more at 8572/o & feels more SOB;  There are no interval medical notes in Epic but BiLoyolaells me that her PCP [name] did a wellness exam & referred her to ENT for hoarseness & had a lesion in her "voice box" but we do not have records and will call for these reports... She notes min cough/ sputum/ no hemoptysis, and she states that her SOB is actually improved... We reviewed the following medical problems during today's office visit>    Dyspnea is multifactorial-- due to COPD/emphysema, smoking, heart dis, deconditioning/ sedentary lifestyle/ 8572/o => multidisciplinary approach supervised by PCP...    COPD/ emphysema-- on Symbicort160-2spBid & Incruse- once daily, & O2 24/7...    Ex-Cigarette smoker, [facility], 65+pack-yr smoking hx    PulmHTN w/ 2DEcho in 2012 showing PAsys est~4360m    CARDIAC Hx>  followed by [name] HBP, sys+diastolicCHF, hx AFib, etc; on ASA81, Coreg6.25Bid, Amlod5, Lotensin10, Lasix20-2/d, Lanoxin0.125-1/2 daily    Medical Issues> HBP, HL (Lip10), DM (Metform, Glimep), FTT/ physical deconditioning/ poor nutrition; prev followed by [name]-[facility]...  EXAM shows Afeb, VSS, O2sat=99% on 2L O2;  90#, 5'2"Tall, BMI~16;  HEENT- neg, mallamapti2, tongue coated;  Chest- congested cough, decrBS, scat rhonchi, no w/r/consolidation;  Heart- RR w/o m/r/g;  Abd- thin, soft, neg;  Ext- w/o c/c/e...  IMP/PLAN>>  [name] appears stable on her O2, Symbicort, Incruse; asked to stay current on indicated vaccinations thru her PCP office; asked to stay active in a regular exercise program; call for any breathing issues...   ~  October 05, 2017:  3-16mo616mo & add-on appt request for refractory cough & chest congestion>  When last checked in Feb- [name] was stable at age 75 o22her O2, Symbicort, Incruse, and encouraged to increase exercise;  She returns today at the request of her PCP- [name] for a cough over the last  month>  Pt is sl confused & daughter augments the hx, while  daugh was out of town pt stayed w/ her son, on return daugh noted pt's cough/ small amt sput, some chest congestion & wheezing; not feeling well, some incr DOE, but denies f/c/s; she was seen by her PCP & given Augmentin x10d & 5d of Pred but daughter reports no better, increased congestion and referred here;  In the interim she was seen by Cards and they checked CXR which was clear- NAD; pt denies CP/ palpit/ dizzy/ edema... We reviewed the following interval medical notes in Epic>  We do not have records from PCP...    She saw ENT- DrMarcellino on 05/30/17>  Dysphonia & age-related vocal fold atrophy, GERD/LPR; note reviewed, ex-smoker quit ~13yr on nasal O2; she was rec to try voice therapy eval & antireflux regimen...     She had series of 10 outpt speech-path treatments ordered by ENT- DrMarcellino & completed 08/2017>  Dysphonia- notes reviewed, family noted improved voice quality & endurance, aspiration precautions, etc...     She saw CARDS-Cherly Hensenon 09/25/17>  Hx HBP, sys+diastolicCHF, PAFib; not on coumadin due to age & underweight, 2DEcho in 4/18 w/ EF=40-45% w/ diffuse HK & abn septal motion, Gr1DD, & RVsys=23; EKG showed NSR, rate60, RBBB & LAFB; They cut her Lasix to 227md & rec continue ASA81, Digox0.125-1/2 tab daily, Coreg6.25Bid, Lotensin10, Lipitor10...  We reviewed the following medical problems during today's office visit>    Dyspnea is multifactorial-- due to COPD/emphysema, smoking, heart dis, deconditioning/ sedentary lifestyle/ 8547/o => multidisciplinary approach supervised by PCP...    COPD/ emphysema-- on Symbicort160-2spBid & Incruse- once daily, & O2 _0 /min 24/7...    Ex-Cigarette smoker, [facility], 65+pack-yr smoking hx    HxPulmHTN w/ 2DEcho in 2012 showing PAsys est~4325m>  Follow up 2DEcho during 4/18 Hosp showed RVsys~60m57m..    CARDIAC Hx>  followed by DrNiCherly Hartman  HBP, systolic+diastolicCHF, hx AFib; on ASA8HAL93reg6.25Bid, , Lotensin10, Lasix20-d, Lanoxin0.125-1/2 daily    Medical Issues> HBP, HL (Lip10), DM (Metform, Glimep), prev stroke w/ abn MRI, FTT/ physical deconditioning/ poor nutrition; followed by DrKoCurly Rim we do not have notes or updated med list... EXAM shows Afeb, VSS, O2sat=98% on 2L O2;  90#, 5'2"Tall, BMI~16;  HEENT- neg, mallamapti2, tongue coated;  Chest- congested cough, decrBS, scat rhonchi, no w/r/consolidation;  Heart- RR w/o m/r/g;  Abd- thin, soft, neg;  Ext- w/o c/c/e...   CXR 09/25/17 by DrNishan>  Cardiomegaly w/o change, aortic calcif, hyperinflation c/w COPD- clear/ NAD, Tspine degen changes...  No recent LABS to review...   CT Chest done 10/20/17>  Heart at upper lim norm for size, calcif in aorta & coronaries, no adenopathy, mild centrilob emphysema, no airsp dis/ nodules/ masses/ effusion/ etc- NAD... IMP/PLAN>>  [name]'s subacute resp exac did not respond to the Augmentin & 5days of Pred from DrKoFreestonee discussed Rx w/ Levaquin500mg24m7d, Depomedrol80 + a Medrol dosepak, and MUCINEX 600mg 20mw/ water by mouth;  Her daughter is concerned that "there may be something developing" & I sought to reassure her by offering a CT Chest to be sure- they were appreciative of this suggestion and wanted to proceed=> CT ordered, completed on 10/20/17 NEG w/o acute abnormalities...    ~  December 20, 2017:  2-86mo RO74mopulmonary follow up visit>  [name] Sheillas that her breathing is good- she responded to our meds after her last visit (Levaquin, Depo, Medrol dosepak, Mucinex w/ fluids)); currently denies cough, sputum, hemoptysis, CP, palpit,  edema, and SOB- she admits to some DOE w/ stairs but otherw independent at home she says; she is supposed to be on O2 at 2L/min 24/7, Symbicort160-2spBid, Incruse once daily, Mucinex600-2Bid w/ fluids... her CC today is poor appetite, not eating well, weight loss (but by our scale her  weight is 89# and it was recorded as 90# 5 months ago; we do not have notes from her PCP to be able to follow up Labs etc...  We reviewed the following medical problems during today's office visit>    Dyspnea is multifactorial-- due to COPD/emphysema, smoking, heart dis, deconditioning/ sedentary lifestyle/ 84 y/o => multidisciplinary approach supervised by PCP...    COPD/ emphysema-- on Symbicort160-2spBid, Incruse- once daily, Mucinex600-2Bid, & O2 _0 /min 24/7...    Ex-Cigarette smoker, prev smoking 3cig/d prior to 07/2016 [facility] & quit then, 65+pack-yr smoking hx    HxPulmHTN w/ 2DEcho in 2012 showing PAsys est~85mHg>  Follow up 2DEcho during 4/18 Hosp showed RVsys~229mg...    CARDIAC Hx>  followed by Debra Hensenor HBP, systolic+diastolicCHF, hx AFib; on ASIHK74Coreg6.25Bid, Lotensin10, Lasix20/d, Lanoxin0.125-1/2 daily    Medical Issues> HBP, HL (Lip10), DM (Metform, Glimep), prev stroke w/ abn MRI, FTT/ physical deconditioning/ weakness/ poor nutrition; followed by DrCurly Rimut we do not have notes or updated med list... EXAM shows Afeb, VSS, O2sat=99% on 2L pulse-dose O2;  89#, 5'2"Tall, BMI~16;  HEENT- neg, mallamapti2;  Chest- decrBS, scat rhonchi, no w/r/consolidation;  Heart- RR w/o m/r/g;  Abd- thin, soft, neg;  Ext- w/o c/c/e...  IMP/PLAN>>  Emmarie appears stable from the pulmonary standpoint- rec to continue same meds, taken regularly;  She has not heeded my advise to incr her physical activity w/ a graded exercise program and I stressed the importance of regular exercise again today;  Her CC is poor appetite/ intake, and weight loss- recommended to start a nutritional supplement like Ensure, Sustacal, Boost or Carnation instant twice daily betw meals and try to eat a little something she likes at each regular meal time;  She is further recommended to f/u w/ her PCP [name] for further evaluation, lab work, and to consider appetite stim w/ Megace.     We discussed my  upcoming retirement in Jan2020 & we will arrange for a follow up visit w/ one of my young partners in about 23m31mome...     Past Medical History:  Diagnosis Date  . Acute bronchitis   . Acute on chronic respiratory failure with hypoxia (HCCLaw[place] . Benign hypertensive renal disease   . CAD (coronary artery disease)   . Cardiomyopathy    non-ischemic 04/2008 EF 45% cardiac MRI no scar  . CHF (congestive heart failure) ([place]CC[place] . Chronic sinusitis   . COPD (chronic obstructive pulmonary disease) ([place]CC[place] . COPD exacerbation ([place]CC[place] . Depression   . Diabetes mellitus without complication, without long-term current use of insulin ([place]CC[place] . Dyslipidemia associated with type 2 diabetes mellitus (HCCHat[place] . Dyspnea on exertion    chronic  . Hypercholesterolemia   . Hypertension   . On home oxygen therapy    "3L; 24/7; for the last 2 1/2 weeks; none before that" (07/26/2016)  . Protein-calorie malnutrition, severe (HCCGraft[place] . Sinus headache    "chronic"  . Stroke ([facility]   "mild"  . Type II diabetes mellitus (HCCSand[place]   Past Surgical History:  Procedure Laterality Date  . carotid angiogram     2006  .  ESOPHAGOGASTRODUODENOSCOPY     with foreign body removal  . NASAL SINUS SURGERY  ~1974;~ 1976  . TONSILLECTOMY     "when I was a chld"    Outpatient Encounter Medications as of 12/20/2017  Medication Sig  . albuterol (VENTOLIN HFA) 108 (90 Base) MCG/ACT inhaler Inhale 1-2 puffs into the lungs every 6 (six) hours as needed for wheezing or shortness of breath.  Debra Kitchen aspirin EC 81 MG tablet Take 81 mg by mouth daily.  Debra Kitchen atorvastatin (LIPITOR) 10 MG tablet TAKE 1 TABLET(10 MG) BY MOUTH DAILY AT 6 PM  . benazepril (LOTENSIN) 10 MG tablet Take 10 mg by mouth 2 (two) times daily.   . budesonide-formoterol (SYMBICORT) 160-4.5 MCG/ACT inhaler INHALE 2 PUFFS INTO THE LUNGS TWICE DAILY  . carvedilol (COREG) 6.25 MG tablet Take 1 tablet (6.25 mg total) by mouth 2 (two) times daily with a meal.  . DIGOX  125 MCG tablet TAKE 1/2 TABLET BY MOUTH DAILY  . ferrous sulfate 325 (65 FE) MG tablet Take 325 mg by mouth daily with breakfast.  . fluticasone (FLONASE) 50 MCG/ACT nasal spray Place 1 spray into both nostrils daily as needed for allergies.  . furosemide (LASIX) 20 MG tablet TAKE 1 TABLET(20 MG) BY MOUTH DAILY  . guaiFENesin (MUCINEX) 600 MG 12 hr tablet Take by mouth 4 (four) times daily. With fluids  . INCRUSE ELLIPTA 62.5 MCG/INH AEPB INHALE 1 PUFF INTO THE LUNGS DAILY  . insulin aspart (NOVOLOG) 100 UNIT/ML injection Inject into the skin daily as needed for high blood sugar (For CBG >300). As needed  . metFORMIN (GLUCOPHAGE-XR) 500 MG 24 hr tablet Take 1,000 mg by mouth 2 (two) times daily.   . Multiple Vitamin (MV-ONE PO) Take 1 tablet by mouth daily.  Debra Kitchen Spacer/Aero-Holding Chambers (AEROCHAMBER MV) inhaler Use as instructed  . UNABLE TO FIND Med Name: Med pass 120 mL by mouth 3 times daily  . [DISCONTINUED] budesonide-formoterol (SYMBICORT) 160-4.5 MCG/ACT inhaler INHALE 2 PUFFS INTO THE LUNGS TWICE DAILY  . [DISCONTINUED] levofloxacin (LEVAQUIN) 500 MG tablet Take 1 tablet (500 mg total) by mouth daily.  . [DISCONTINUED] methylPREDNISolone (MEDROL DOSEPAK) 4 MG TBPK tablet 6 day pak-take as directed  . [DISCONTINUED] umeclidinium bromide (INCRUSE ELLIPTA) 62.5 MCG/INH AEPB Inhale 1 puff into the lungs daily.   No facility-administered encounter medications on file as of 12/20/2017.     No Known Allergies   Immunization History  Administered Date(s) Administered  . Influenza Split 01/23/2017  . Influenza,inj,Quad PF,6+ Mos 01/24/2015, 01/24/2016  . Pneumococcal-Unspecified 12/25/2014  NEED TO CHECK w/ DrSanders/ Koirala/ Pandy about her IMMUNIZ HISTORY...   Current Medications, Allergies, Past Medical History, Past Surgical History, Family History, and Social History were reviewed in [employer] record.   Review of Systems             All symptoms NEG  except where BOLDED >>  Constitutional:  F/C/S, fatigue, anorexia, unexpected weight change. HEENT:  HA, visual changes, hearing loss, earache, nasal symptoms, sore throat, mouth sores, hoarseness. Resp:  cough, sputum, hemoptysis; SOB, tightness, wheezing. Cardio:  CP, palpit, DOE, orthopnea, edema. GI:  N/V/D/C, blood in stool; reflux, abd pain, distention, gas. GU:  dysuria, freq, urgency, hematuria, flank pain, voiding difficulty. MS:  joint pain, swelling, tenderness, decr ROM; neck pain, back pain, etc. Neuro:  HA, tremors, seizures, dizziness, syncope, weakness, numbness, gait abn. Skin:  suspicious lesions or skin rash. Heme:  adenopathy, bruising, bleeding. Psyche:  confusion, agitation, sleep disturbance, hallucinations,  anxiety, depression suicidal.   Objective:   Physical Exam       Vital Signs:  Reviewed...   General:  WD, underweight, 84 y/o BF in NAD; alert & oriented; pleasant & cooperative... HEENT:  [name]/AT; Conjunctiva- pink, Sclera- nonicteric, EOM-wnl, PERRLA, Fundi-benign; EACs-clear, TMs-wnl; NOSE-sl red; THROAT-clear & wnl.  Neck:  Supple w/ decr ROM; no JVD; normal carotid impulses w/o bruits; no thyromegaly or nodules palpated; no lymphadenopathy.  Chest:  DecrBS, clear without wheezes, rales, or rhonchi heard. Heart:  Regular Rhythm; norm S1 & S2 without murmurs, rubs, or gallops detected. Abdomen:  Soft & nontender- no guarding or rebound; normal bowel sounds; no organomegaly or masses palpated. Ext:  decrROM; without deformities +arthritic changes; no varicose veins, +venous insuffic, no edema;  Pulses intact w/o bruits. Neuro:  No focal neuro deficits Derm:  No lesions noted; no rash etc. Lymph:  No cervical, supraclavicular, axillary, or inguinal adenopathy palpated.   Assessment:      IMP>>    Dyspnea is multifactorial-- due to COPD/emphysema, smoking, heart dis, deconditioning/ sedentary lifestyle/ 84 y/o...    COPD/ emphysema    Cigarette smoker,  still smoking 3cig/d, 65+pack-yr smoking hx    PulmHTN w/ 2DEcho in 2012 showing PAsys est~65mHg    CARDIAC Hx>  followed by DCherly Hensenfor HBP, diastolicCHF, hx AFib    MEDICAL Hx>  follwed by PCP for HBP, HL, DM, FTT/ underwt, etc...  PLAN>> 05/27/15>   We reviewed her objective data and the Dx of COPD/emphysema (GOLD Stage3); she understands that she must quit all smoking, and rec to start regular inhaler use-- SYMBICORT160-2spBid, INCRUSE-once daily, +OTC MUCINEX604m1-2Bid, +nutritional supplements daily;  She needs regular f/u w/ her PCP to work on med compliance, nutritional support, etc... ROV recheck in 6wks. 07/21/15>   She is again advised to quit all smoking! We reviewed med regimen w/ Symbicort160, Incruse, Mucinex, AlbutHFA rescue inhaler, etc;  Reminded to incr exercise program, walk regularly, eat better and gain some weight... 08/15/16>   Natallia MUST take all meds as directed (needs admin from Family member care giver) and must commit to NO SMOKING;  We will check ONO in the interim;  Tongue coated- needs attn to oral hygiene, rx Nystatin;  Needs PT/ PT/ visiting nurses, etc... We plan ROV recheck 7m52mo 09/15/26>   [name] must commit to staying off the cigs;  For now we discussed using the Symbicort/ Incruse everyday & the VentolinHFA prn (she will leave off the NEBs as she has already stopped this on her own);  Must use her O2 24/7 and of course no smoke;  Medical issues per [name] including- HBP, disatolicCHF, HxPAF, HL, DM, FTT/ physical deconditioning/ poor nutrition 11/21/16>   Again BilCalypso reminded to do her NEB Tid followed by the Advair-2spBid & Incruse once daily;  [name] managing her PAF, Systolic & Diastolic CHF, HBP etc;  [name] manages her HBP, DM, HL, Anemia etc 02/21/17>  Continue Symbicort/ incruse & prn Albut rescue inhaler via aerochanber; need to stay off cigs and incr activity/ exercise. 06/22/17>   [name] appears stable on her O2, Symbicort, Incruse; asked to  stay current on indicated vaccinations thru her PCP office; asked to stay active in a regular exercise program; call for any breathing issues...  10/05/17>   [name]'s subacute resp exac did not respond to the Augmentin & 5days of Pred from DrKFreelandWe discussed Rx w/ [id number], Depomedrol80 + a Medrol dosepak, and MUCINEX 600mg32m w/ water by  mouth;  Her daughter is concerned that "there may be something developing" & I sought to reassure her by offering a CT Chest to be sure- they were appreciative of this suggestion and wanted to proceed=> CT ordered, completed on 10/20/17 NEG w/o acute abnormalities. 12/20/17>   [name] appears stable from the pulmonary standpoint- rec to continue same meds, taken regularly;  She has not heeded my advise to incr her physical activity w/ a graded exercise program and I stressed the importance of regular exercise again today;  Her CC is poor appetite/ intake, and weight loss- recommended to start a nutritional supplement like Ensure, Sustacal, Boost or Carnation instant twice daily betw meals and try to eat a little something she likes at each regular meal time;  She is further recommended to f/u w/ her PCP [name] for further evaluation, lab work, and to consider appetite stim w/ Megace.     We discussed my upcoming retirement in Jan2020 & we will arrange for a follow up visit w/ one of my young partners in about 87motime...    Plan:     Patient's Medications  New Prescriptions   No medications on file  Previous Medications   ALBUTEROL (VENTOLIN HFA) 108 (90 BASE) MCG/ACT INHALER    Inhale 1-2 puffs into the lungs every 6 (six) hours as needed for wheezing or shortness of breath.   ASPIRIN EC 81 MG TABLET    Take 81 mg by mouth daily.   ATORVASTATIN (LIPITOR) 10 MG TABLET    TAKE 1 TABLET(10 MG) BY MOUTH DAILY AT 6 PM   BENAZEPRIL (LOTENSIN) 10 MG TABLET    Take 10 mg by mouth 2 (two) times daily.    BUDESONIDE-FORMOTEROL (SYMBICORT) 160-4.5 MCG/ACT  INHALER    INHALE 2 PUFFS INTO THE LUNGS TWICE DAILY   CARVEDILOL (COREG) 6.25 MG TABLET    Take 1 tablet (6.25 mg total) by mouth 2 (two) times daily with a meal.   DIGOX 125 MCG TABLET    TAKE 1/2 TABLET BY MOUTH DAILY   FERROUS SULFATE 325 (65 FE) MG TABLET    Take 325 mg by mouth daily with breakfast.   FLUTICASONE (FLONASE) 50 MCG/ACT NASAL SPRAY    Place 1 spray into both nostrils daily as needed for allergies.   FUROSEMIDE (LASIX) 20 MG TABLET    TAKE 1 TABLET(20 MG) BY MOUTH DAILY   GUAIFENESIN (MUCINEX) 600 MG 12 HR TABLET    Take by mouth 4 (four) times daily. With fluids   INCRUSE ELLIPTA 62.5 MCG/INH AEPB    INHALE 1 PUFF INTO THE LUNGS DAILY   INSULIN ASPART (NOVOLOG) 100 UNIT/ML INJECTION    Inject into the skin daily as needed for high blood sugar (For CBG >300). As needed   METFORMIN (GLUCOPHAGE-XR) 500 MG 24 HR TABLET    Take 1,000 mg by mouth 2 (two) times daily.    MULTIPLE VITAMIN (MV-ONE PO)    Take 1 tablet by mouth daily.   SPACER/AERO-HOLDING CHAMBERS (AEROCHAMBER MV) INHALER    Use as instructed   UNABLE TO FIND    Med Name: Med pass 120 mL by mouth 3 times daily  Modified Medications   No medications on file  Discontinued Medications   BUDESONIDE-FORMOTEROL (SYMBICORT) 160-4.5 MCG/ACT INHALER    INHALE 2 PUFFS INTO THE LUNGS TWICE DAILY   LEVOFLOXACIN (LEVAQUIN) 500 MG TABLET    Take 1 tablet (500 mg total) by mouth daily.   METHYLPREDNISOLONE (MEDROL DOSEPAK) 4 MG  TBPK TABLET    6 day pak-take as directed   UMECLIDINIUM BROMIDE (INCRUSE ELLIPTA) 62.5 MCG/INH AEPB    Inhale 1 puff into the lungs daily.

## 2018-01-01 DIAGNOSIS — I10 Essential (primary) hypertension: Secondary | ICD-10-CM | POA: Diagnosis not present

## 2018-01-01 DIAGNOSIS — J449 Chronic obstructive pulmonary disease, unspecified: Secondary | ICD-10-CM | POA: Diagnosis not present

## 2018-01-01 DIAGNOSIS — E43 Unspecified severe protein-calorie malnutrition: Secondary | ICD-10-CM | POA: Diagnosis not present

## 2018-01-01 DIAGNOSIS — J209 Acute bronchitis, unspecified: Secondary | ICD-10-CM | POA: Diagnosis not present

## 2018-02-05 DIAGNOSIS — Z23 Encounter for immunization: Secondary | ICD-10-CM | POA: Diagnosis not present

## 2018-03-04 ENCOUNTER — Other Ambulatory Visit: Payer: Self-pay | Admitting: Pulmonary Disease

## 2018-03-27 DIAGNOSIS — R413 Other amnesia: Secondary | ICD-10-CM | POA: Diagnosis not present

## 2018-03-27 DIAGNOSIS — I10 Essential (primary) hypertension: Secondary | ICD-10-CM | POA: Diagnosis not present

## 2018-03-27 DIAGNOSIS — D649 Anemia, unspecified: Secondary | ICD-10-CM | POA: Diagnosis not present

## 2018-03-27 DIAGNOSIS — E43 Unspecified severe protein-calorie malnutrition: Secondary | ICD-10-CM | POA: Diagnosis not present

## 2018-03-27 DIAGNOSIS — Z794 Long term (current) use of insulin: Secondary | ICD-10-CM | POA: Diagnosis not present

## 2018-03-27 DIAGNOSIS — E7800 Pure hypercholesterolemia, unspecified: Secondary | ICD-10-CM | POA: Diagnosis not present

## 2018-03-27 DIAGNOSIS — J449 Chronic obstructive pulmonary disease, unspecified: Secondary | ICD-10-CM | POA: Diagnosis not present

## 2018-03-27 DIAGNOSIS — E119 Type 2 diabetes mellitus without complications: Secondary | ICD-10-CM | POA: Diagnosis not present

## 2018-04-03 ENCOUNTER — Other Ambulatory Visit: Payer: Self-pay | Admitting: *Deleted

## 2018-04-04 ENCOUNTER — Other Ambulatory Visit: Payer: Self-pay | Admitting: *Deleted

## 2018-04-12 DIAGNOSIS — J441 Chronic obstructive pulmonary disease with (acute) exacerbation: Secondary | ICD-10-CM | POA: Diagnosis not present

## 2018-04-13 DIAGNOSIS — H903 Sensorineural hearing loss, bilateral: Secondary | ICD-10-CM | POA: Diagnosis not present

## 2018-04-13 DIAGNOSIS — Z9981 Dependence on supplemental oxygen: Secondary | ICD-10-CM | POA: Diagnosis not present

## 2018-04-13 DIAGNOSIS — Z87891 Personal history of nicotine dependence: Secondary | ICD-10-CM | POA: Diagnosis not present

## 2018-04-13 DIAGNOSIS — H6123 Impacted cerumen, bilateral: Secondary | ICD-10-CM | POA: Diagnosis not present

## 2018-04-23 NOTE — Progress Notes (Signed)
@Patient  ID: [name], female    DOB: Mar 22, 1930, 84 y.o.   MRN: [id number]  Chief Complaint  Patient presents with  . Acute Visit    Cough    Referring provider: [name], MD  HPI:  84 year old female former smoker (quit April/2018) followed in our office with severe COPD and emphysema, pulmonary hypertension  PMH: Diastolic congestive heart failure, PAF, underweight and failure to thrive Smoker/ Smoking History: Former smoker.  Greater than 65-pack-year smoking history.  Quit April/2018. Maintenance: Symbicort 160 Pt of: Dr. Kriste Basque   04/24/2018  - Visit   84 year old female former smoker presenting to our office today for acute visit of continued cough, increased congestion, persistent fever, no improvement after primary care visit.  Primary care initially treated patient with doxycycline for 10 days as well as 5 days of prednisone and Depo-Medrol injection.  Patient's daughter reports the patient has shown no improvement.  They report the patient feels the same.  Patient also has decreased activity as well as decreased appetite over the course of this illness.  Patient remains on 2 L O2 and has not needed increased oxygen needs during the course of the illness.  Patient is febrile today with a temperature of 99.  Patient is a former Dr. Kriste Basque patient for pulmonary.  Patient will establish with Dr. Tonia Brooms in January/2020 for pulmonary needs.      Tests:  10/20/2017-CT chest without contrast-no acute abnormality, no evidence of pneumonia, mild centrilobular emphysema  07/29/2016-echocardiogram-LV ejection fraction 40 to 45%, diffuse hypokinesis abnormal septal motion, cavity size is mildly elevated dilated, mild mitral valve regurgitation  FENO:  No results found for: NITRICOXIDE  PFT: No flowsheet data found.  Imaging: No results found.    Specialty Problems      Pulmonary Problems   Acute bronchitis   Acute on chronic respiratory failure with hypoxia  (HCC)   Chronic obstructive pulmonary disease (HCC)   COPD mixed type (HCC)   COPD with acute exacerbation (HCC)      No Known Allergies  Immunization History  Administered Date(s) Administered  . Influenza Split 01/23/2017  . Influenza, High Dose Seasonal PF 02/05/2018  . Influenza,inj,Quad PF,6+ Mos 01/24/2015, 01/24/2016  . Pneumococcal-Unspecified 12/25/2014    Past Medical History:  Diagnosis Date  . Acute bronchitis   . Acute on chronic respiratory failure with hypoxia (HCC)   . Benign hypertensive renal disease   . CAD (coronary artery disease)   . Cardiomyopathy    non-ischemic 04/2008 EF 45% cardiac MRI no scar  . CHF (congestive heart failure) (HCC)   . Chronic sinusitis   . COPD (chronic obstructive pulmonary disease) (HCC)   . COPD exacerbation (HCC)   . Depression   . Diabetes mellitus without complication, without long-term current use of insulin (HCC)   . Dyslipidemia associated with type 2 diabetes mellitus (HCC)   . Dyspnea on exertion    chronic  . Hypercholesterolemia   . Hypertension   . On home oxygen therapy    "3L; 24/7; for the last 2 1/2 weeks; none before that" (07/26/2016)  . Protein-calorie malnutrition, severe (HCC)   . Sinus headache    "chronic"  . Stroke [facility]) 1997   "mild"  . Type II diabetes mellitus (HCC)     Tobacco History: Social History   Tobacco Use  Smoking Status Former Smoker  . Packs/day: 0.75  . Years: 70.00  . Pack years: 52.50  . Types: Cigarettes  . Last attempt to quit:  07/26/2016  . Years since quitting: 1.7  Smokeless Tobacco Never Used   Counseling given: Yes  Continue to not smoke  Outpatient Encounter Medications as of 04/24/2018  Medication Sig  . albuterol (VENTOLIN HFA) 108 (90 Base) MCG/ACT inhaler Inhale 1-2 puffs into the lungs every 6 (six) hours as needed for wheezing or shortness of breath.  Marland Kitchen aspirin EC 81 MG tablet Take 81 mg by mouth daily.  Marland Kitchen atorvastatin (LIPITOR) 10 MG tablet TAKE 1  TABLET(10 MG) BY MOUTH DAILY AT 6 PM  . benazepril (LOTENSIN) 10 MG tablet Take 10 mg by mouth 2 (two) times daily.   . budesonide-formoterol (SYMBICORT) 160-4.5 MCG/ACT inhaler INHALE 2 PUFFS INTO THE LUNGS TWICE DAILY  . carvedilol (COREG) 6.25 MG tablet Take 1 tablet (6.25 mg total) by mouth 2 (two) times daily with a meal.  . DIGOX 125 MCG tablet TAKE 1/2 TABLET BY MOUTH DAILY  . ferrous sulfate 325 (65 FE) MG tablet Take 325 mg by mouth daily with breakfast.  . fluticasone (FLONASE) 50 MCG/ACT nasal spray Place 1 spray into both nostrils daily as needed for allergies.  . furosemide (LASIX) 20 MG tablet TAKE 1 TABLET(20 MG) BY MOUTH DAILY  . guaiFENesin (MUCINEX) 600 MG 12 hr tablet Take by mouth 2 (two) times daily. With fluids   . INCRUSE ELLIPTA 62.5 MCG/INH AEPB INHALE 1 PUFF INTO THE LUNGS DAILY  . insulin aspart (NOVOLOG) 100 UNIT/ML injection Inject into the skin daily as needed for high blood sugar (For CBG >300). As needed  . metFORMIN (GLUCOPHAGE-XR) 500 MG 24 hr tablet Take 1,000 mg by mouth 2 (two) times daily.   . Multiple Vitamin (MV-ONE PO) Take 1 tablet by mouth daily.  . promethazine-codeine (PHENERGAN WITH CODEINE) 6.25-10 MG/5ML syrup Take 5 mLs by mouth 2 (two) times daily.  Marland Kitchen Spacer/Aero-Holding Chambers (AEROCHAMBER MV) inhaler Use as instructed  . UNABLE TO FIND Med Name: Med pass 120 mL by mouth 3 times daily  . [DISCONTINUED] budesonide-formoterol (SYMBICORT) 160-4.5 MCG/ACT inhaler INHALE 2 PUFFS INTO THE LUNGS TWICE DAILY  . amoxicillin-clavulanate (AUGMENTIN) 875-125 MG tablet Take 1 tablet by mouth 2 (two) times daily.  . predniSONE (DELTASONE) 10 MG tablet 4 tabs for 2 days, then 3 tabs for 2 days, 2 tabs for 2 days, then 1 tab for 2 days, then stop   No facility-administered encounter medications on file as of 04/24/2018.      Review of Systems  Review of Systems  Constitutional: Positive for appetite change, chills, fatigue and fever.  HENT: Positive  for congestion and sinus pressure.   Respiratory: Positive for cough (productive green yellow color) and shortness of breath.   Cardiovascular: Negative for chest pain and palpitations.  Gastrointestinal: Negative for diarrhea, nausea and vomiting.  Neurological: Negative for headaches.  All other systems reviewed and are negative.    Physical Exam  BP 130/78 (BP Location: Left Arm, Cuff Size: Normal)   Pulse 93   Temp 99.2 F (37.3 C) (Oral)   Ht 5\' 2"  (1.575 m)   Wt 84 lb (38.1 kg)   SpO2 92%   BMI 15.36 kg/m   Wt Readings from Last 5 Encounters:  04/24/18 84 lb (38.1 kg)  12/20/17 89 lb 6.4 oz (40.6 kg)  10/05/17 90 lb 6.4 oz (41 kg)  09/25/17 87 lb 12 oz (39.8 kg)  06/22/17 89 lb 9.6 oz (40.6 kg)   2 L via nasal cannula continuous  Physical Exam  Constitutional: She is oriented  to person, place, and time. She appears cachectic. She has a sickly appearance. No distress.  + Thin elderly female   HENT:  Head: Normocephalic and atraumatic.  Right Ear: Hearing, external ear and ear canal normal.  Left Ear: Hearing, external ear and ear canal normal.  Nose: Mucosal edema and rhinorrhea present. Right sinus exhibits no maxillary sinus tenderness and no frontal sinus tenderness. Left sinus exhibits no maxillary sinus tenderness and no frontal sinus tenderness.  Mouth/Throat: Uvula is midline and oropharynx is clear and moist. No oropharyngeal exudate.  + TMs with effusion without infection bilaterally + Patient is hard of hearing bilaterally  Eyes: Pupils are equal, round, and reactive to light.  Neck: Normal range of motion. Neck supple.  Cardiovascular: Normal rate, regular rhythm and normal heart sounds.  Pulmonary/Chest: Effort normal. No accessory muscle usage. No respiratory distress. She has decreased breath sounds (Decreased breath sounds throughout exam, limited air movement). She has no wheezes. She has no rhonchi.  Musculoskeletal: Normal range of motion.         General: No edema.  Lymphadenopathy:    She has no cervical adenopathy.  Neurological: She is alert and oriented to person, place, and time. Gait normal.  Skin: Skin is warm and dry. She is not diaphoretic. No erythema.  Psychiatric: Mood, memory, affect and judgment normal.  Nursing note and vitals reviewed.     Lab Results:  CBC    Component Value Date/Time   WBC 8.8 09/14/2016 1237   RBC 3.47 (L) 09/14/2016 1237   HGB 10.7 (L) 09/14/2016 1237   HGB 12.6 05/24/2016 0924   HCT 32.7 (L) 09/14/2016 1237   HCT 37.9 05/24/2016 0924   PLT 267.0 09/14/2016 1237   PLT 241 05/24/2016 0924   MCV 94.1 09/14/2016 1237   MCV 95 05/24/2016 0924   MCH 29.7 07/30/2016 0408   MCHC 32.7 09/14/2016 1237   RDW 16.7 (H) 09/14/2016 1237   RDW 13.8 05/24/2016 0924   LYMPHSABS 2.7 09/14/2016 1237   MONOABS 1.1 (H) 09/14/2016 1237   EOSABS 0.1 09/14/2016 1237   BASOSABS 0.1 09/14/2016 1237    BMET    Component Value Date/Time   NA 139 09/14/2016 1237   NA 135 (A) 08/04/2016   K 4.0 09/14/2016 1237   CL 94 (L) 09/14/2016 1237   CO2 35 (H) 09/14/2016 1237   GLUCOSE 141 (H) 09/14/2016 1237   BUN 20 09/14/2016 1237   BUN 21 08/04/2016   CREATININE 0.60 09/14/2016 1237   CALCIUM 9.9 09/14/2016 1237   GFRNONAA >60 07/30/2016 0408   GFRAA >60 07/30/2016 0408    BNP    Component Value Date/Time   BNP 163.1 (H) 07/26/2016 1222    ProBNP    Component Value Date/Time   PROBNP 70.0 09/14/2016 1237      Assessment & Plan:   Pleasant 84 year old female patient.  Due to recent antibiotic course as well as no improvement will get chest x-ray today to further evaluate.  I suspect the patient may have had a viral illness that is still is running its course.  And now patient may be having a post viral bronchitis which may be causing a exacerbation of her COPD.  Will further evaluate with chest x-ray.  As we are going into a holiday where office will be closed tomorrow we will start patient  on Augmentin as well as a prednisone taper.  I have encouraged patient and patient's daughter to start probiotic as this is a  second round of antibiotics patient has had this month.  Patient to keep follow-up with Dr. Tonia Brooms in January/2020 to ensure symptoms have resolved.  Patient is more than welcome to present sooner if symptoms have not improved.  Acute bronchitis Augmentin >>> Take 1 875-125 mg tablet every 12 hours for the next 10 days >>> Take with food  Prednisone 10mg  tablet  >>>4 tabs for 2 days, then 3 tabs for 2 days, 2 tabs for 2 days, then 1 tab for 2 days, then stop >>>take with food  >>>take in the morning   Chest x-ray today   Follow-up with Dr. Tonia Brooms in January/2020 to establish with him   Acute on chronic respiratory failure with hypoxia (HCC)  Continue oxygen therapy as prescribed  >>>maintain oxygen saturations greater than 88 percent  >>>if unable to maintain oxygen saturations please contact the office  >>>do not smoke with oxygen  >>>can use nasal saline gel or nasal saline rinses to moisturize nose if oxygen causes dryness  COPD with acute exacerbation (HCC) Augmentin >>> Take 1 875-125 mg tablet every 12 hours for the next 10 days >>> Take with food >>> Start daily probiotic  Prednisone 10mg  tablet  >>>4 tabs for 2 days, then 3 tabs for 2 days, 2 tabs for 2 days, then 1 tab for 2 days, then stop >>>take with food  >>>take in the morning   Chest x-ray today   Follow-up with Dr. Tonia Brooms in January/2020 to establish with him  Continue Symbicort 160 >>> 2 puffs in the morning right when you wake up, rinse out your mouth after use, 12 hours later 2 puffs, rinse after use >>> Take this daily, no matter what >>> This is not a rescue inhaler   Continue oxygen therapy as prescribed  >>>maintain oxygen saturations greater than 88 percent  >>>if unable to maintain oxygen saturations please contact the office  >>>do not smoke with oxygen  >>>can use  nasal saline gel or nasal saline rinses to moisturize nose if oxygen causes dryness  Protein-calorie malnutrition, severe Continue to consume high-protein shakes such as Ensure     Coral Ceo, NP 04/24/2018   This appointment was  34 min long with over 50% of the time in direct face-to-face patient care, assessment, plan of care, and follow-up.

## 2018-04-24 ENCOUNTER — Ambulatory Visit (INDEPENDENT_AMBULATORY_CARE_PROVIDER_SITE_OTHER)
Admission: RE | Admit: 2018-04-24 | Discharge: 2018-04-24 | Disposition: A | Discharge status: Home / Self Care | Payer: Medicare Other | Source: Ambulatory Visit | Attending: Pulmonary Disease | Admitting: Pulmonary Disease

## 2018-04-24 ENCOUNTER — Ambulatory Visit (INDEPENDENT_AMBULATORY_CARE_PROVIDER_SITE_OTHER): Payer: Medicare Other | Admitting: Pulmonary Disease

## 2018-04-24 ENCOUNTER — Encounter: Payer: Self-pay | Admitting: Pulmonary Disease

## 2018-04-24 ENCOUNTER — Other Ambulatory Visit: Payer: Self-pay | Admitting: Pulmonary Disease

## 2018-04-24 VITALS — BP 130/78 | HR 93 | Temp 99.2°F | Ht 62.0 in | Wt 84.0 lb

## 2018-04-24 DIAGNOSIS — J209 Acute bronchitis, unspecified: Secondary | ICD-10-CM | POA: Diagnosis not present

## 2018-04-24 DIAGNOSIS — J449 Chronic obstructive pulmonary disease, unspecified: Secondary | ICD-10-CM

## 2018-04-24 DIAGNOSIS — J9621 Acute and chronic respiratory failure with hypoxia: Secondary | ICD-10-CM | POA: Diagnosis not present

## 2018-04-24 DIAGNOSIS — J441 Chronic obstructive pulmonary disease with (acute) exacerbation: Secondary | ICD-10-CM

## 2018-04-24 DIAGNOSIS — J440 Chronic obstructive pulmonary disease with acute lower respiratory infection: Secondary | ICD-10-CM

## 2018-04-24 DIAGNOSIS — E43 Unspecified severe protein-calorie malnutrition: Secondary | ICD-10-CM

## 2018-04-24 MED ORDER — PREDNISONE 10 MG PO TABS
ORAL_TABLET | ORAL | 0 refills | Status: DC
Start: 2018-04-24 — End: 2019-01-21

## 2018-04-24 MED ORDER — AMOXICILLIN-POT CLAVULANATE 875-125 MG PO TABS: 1.0000 | ORAL_TABLET | Freq: Two times a day (BID) | ORAL | 0 refills | Status: DC

## 2018-04-24 NOTE — Progress Notes (Signed)
Chest x-ray showing a pneumonia.  Continue treatment with Augmentin.  Will need to follow this with another chest x-ray in 4 weeks to ensure that it is resolving.  Ensure patient is adequately hydrated, eating well.  If patient symptoms worsen and she has increased oxygen needs, or is unable to tolerate foods she needs to present to the emergency room for further evaluation.  Elisha Headland, NP

## 2018-04-24 NOTE — Patient Instructions (Addendum)
Augmentin >>> Take 1 875-125 mg tablet every 12 hours for the next 10 days >>> Take with food  Prednisone 10mg  tablet  >>>4 tabs for 2 days, then 3 tabs for 2 days, 2 tabs for 2 days, then 1 tab for 2 days, then stop >>>take with food  >>>take in the morning   Chest x-ray today   Follow-up with Dr. Tonia Brooms in January/2020 to establish with him  Continue Symbicort 160 >>> 2 puffs in the morning right when you wake up, rinse out your mouth after use, 12 hours later 2 puffs, rinse after use >>> Take this daily, no matter what >>> This is not a rescue inhaler   Continue oxygen therapy as prescribed  >>>maintain oxygen saturations greater than 88 percent  >>>if unable to maintain oxygen saturations please contact the office  >>>do not smoke with oxygen  >>>can use nasal saline gel or nasal saline rinses to moisturize nose if oxygen causes dryness    It is flu season:   >>>Remember to be washing your hands regularly, using hand sanitizer, be careful to use around herself with has contact with people who are sick will increase her chances of getting sick yourself. >>> Best ways to protect herself from the flu: Receive the yearly flu vaccine, practice good hand hygiene washing with soap and also using hand sanitizer when available, eat a nutritious meals, get adequate rest, hydrate appropriately   Please contact the office if your symptoms worsen or you have concerns that you are not improving.   Thank you for choosing [facility] for your healthcare, and for allowing [place] to partner with you on your healthcare journey. I am thankful to be able to provide care to you today.   Elisha Headland FNP-C

## 2018-04-24 NOTE — Assessment & Plan Note (Signed)
Augmentin >>> Take 1 875-125 mg tablet every 12 hours for the next 10 days >>> Take with food >>> Start daily probiotic  Prednisone 10mg  tablet  >>>4 tabs for 2 days, then 3 tabs for 2 days, 2 tabs for 2 days, then 1 tab for 2 days, then stop >>>take with food  >>>take in the morning   Chest x-ray today   Follow-up with Dr. Tonia Brooms in January/2020 to establish with him  Continue Symbicort 160 >>> 2 puffs in the morning right when you wake up, rinse out your mouth after use, 12 hours later 2 puffs, rinse after use >>> Take this daily, no matter what >>> This is not a rescue inhaler   Continue oxygen therapy as prescribed  >>>maintain oxygen saturations greater than 88 percent  >>>if unable to maintain oxygen saturations please contact the office  >>>do not smoke with oxygen  >>>can use nasal saline gel or nasal saline rinses to moisturize nose if oxygen causes dryness

## 2018-04-24 NOTE — Assessment & Plan Note (Signed)
Augmentin >>> Take 1 875-125 mg tablet every 12 hours for the next 10 days >>> Take with food  Prednisone 10mg  tablet  >>>4 tabs for 2 days, then 3 tabs for 2 days, 2 tabs for 2 days, then 1 tab for 2 days, then stop >>>take with food  >>>take in the morning   Chest x-ray today   Follow-up with Dr. Tonia Brooms in January/2020 to establish with him

## 2018-04-24 NOTE — Assessment & Plan Note (Signed)
  Continue oxygen therapy as prescribed  >>>maintain oxygen saturations greater than 88 percent  >>>if unable to maintain oxygen saturations please contact the office  >>>do not smoke with oxygen  >>>can use nasal saline gel or nasal saline rinses to moisturize nose if oxygen causes dryness  

## 2018-04-24 NOTE — Assessment & Plan Note (Signed)
Continue to consume high-protein shakes such as Ensure

## 2018-04-25 NOTE — Progress Notes (Signed)
Thanks. We will be glad to see her.  Josephine Igo, DO [facility] 04/25/2018 6:15 PM

## 2018-05-01 DIAGNOSIS — E538 Deficiency of other specified B group vitamins: Secondary | ICD-10-CM | POA: Diagnosis not present

## 2018-05-02 ENCOUNTER — Ambulatory Visit: Payer: Medicare Other | Admitting: Pulmonary Disease

## 2018-05-04 ENCOUNTER — Ambulatory Visit (INDEPENDENT_AMBULATORY_CARE_PROVIDER_SITE_OTHER): Payer: Medicare Other | Admitting: Pulmonary Disease

## 2018-05-04 ENCOUNTER — Encounter: Payer: Self-pay | Admitting: Pulmonary Disease

## 2018-05-04 VITALS — BP 140/70 | HR 63 | Ht 62.0 in | Wt 84.0 lb

## 2018-05-04 DIAGNOSIS — J181 Lobar pneumonia, unspecified organism: Secondary | ICD-10-CM

## 2018-05-04 DIAGNOSIS — J432 Centrilobular emphysema: Secondary | ICD-10-CM | POA: Diagnosis not present

## 2018-05-04 DIAGNOSIS — I5032 Chronic diastolic (congestive) heart failure: Secondary | ICD-10-CM

## 2018-05-04 DIAGNOSIS — J449 Chronic obstructive pulmonary disease, unspecified: Secondary | ICD-10-CM

## 2018-05-04 DIAGNOSIS — R64 Cachexia: Secondary | ICD-10-CM | POA: Diagnosis not present

## 2018-05-04 DIAGNOSIS — J9611 Chronic respiratory failure with hypoxia: Secondary | ICD-10-CM | POA: Diagnosis not present

## 2018-05-04 DIAGNOSIS — R636 Underweight: Secondary | ICD-10-CM | POA: Diagnosis not present

## 2018-05-04 DIAGNOSIS — J189 Pneumonia, unspecified organism: Secondary | ICD-10-CM

## 2018-05-04 DIAGNOSIS — B370 Candidal stomatitis: Secondary | ICD-10-CM

## 2018-05-04 DIAGNOSIS — E43 Unspecified severe protein-calorie malnutrition: Secondary | ICD-10-CM

## 2018-05-04 MED ORDER — ALBUTEROL SULFATE (2.5 MG/3ML) 0.083% IN NEBU: 2.5000 mg | INHALATION_SOLUTION | RESPIRATORY_TRACT | 5 refills | Status: DC | PRN

## 2018-05-04 MED ORDER — CLOTRIMAZOLE 10 MG MT TROC: 10.0000 mg | Freq: Every day | OROMUCOSAL | 0 refills | Status: AC

## 2018-05-04 MED ORDER — TIOTROPIUM BROMIDE MONOHYDRATE 2.5 MCG/ACT IN AERS: 2.0000 | INHALATION_SPRAY | Freq: Every day | RESPIRATORY_TRACT | 0 refills | Status: DC

## 2018-05-04 NOTE — Progress Notes (Signed)
Synopsis: Referred in January 2020 for follow-up from pneumonia.  By Debra Bussing, MD  Subjective:   PATIENT ID: [name]: female DOB: Jul 29, 1929, MRN: [id number]  Chief Complaint  Patient presents with  . Follow-up    Recently completed antibiotic for PNE. States she is still coughing. Congestion has improved. She uses 3L of oxygen.     This is an 85 year old female with a past medical history of chronic hypoxemic respiratory failure from nonischemic cardiomyopathy history of ejection fraction 40 to 45%, severe COPD prior spirometry with FEV1 of 0.7 L.,  BMI 15.  She is here today for follow-up after recent diagnosis of a left basilar pneumonia in December 2019.  She has completed a course of antibiotics.  She is slowly improving.  Daughter is present with her today in the office.  She said her appetite has been poor and she is slowly been trying to increase food intake.  She is still requiring 3 L nasal cannula.  Her cough is still present but slowly improving.  We also discussed goals of care today in the office.  She does have significant chronic oxygen dependent lung disease and chronic heart failure which predisposes her to an overall poor prognosis when it would come to resuscitation measures if that was needed.  Patient and family are aware of this.  They would like to discuss amongst family as well as PCP to continue this discussion as they have had this brought up in the past.  She has been a patient of Dr. Kriste Basque for the past several years.   Past Medical History:  Diagnosis Date  . Acute bronchitis   . Acute on chronic respiratory failure with hypoxia (HCC)   . Benign hypertensive renal disease   . CAD (coronary artery disease)   . Cardiomyopathy    non-ischemic 04/2008 EF 45% cardiac MRI no scar  . CHF (congestive heart failure) (HCC)   . Chronic sinusitis   . COPD (chronic obstructive pulmonary disease) (HCC)   . COPD exacerbation (HCC)   . Depression     . Diabetes mellitus without complication, without long-term current use of insulin (HCC)   . Dyslipidemia associated with type 2 diabetes mellitus (HCC)   . Dyspnea on exertion    chronic  . Hypercholesterolemia   . Hypertension   . On home oxygen therapy    "3L; 24/7; for the last 2 1/2 weeks; none before that" (07/26/2016)  . Protein-calorie malnutrition, severe (HCC)   . Sinus headache    "chronic"  . Stroke [facility]) 1997   "mild"  . Type II diabetes mellitus (HCC)      Family History  Problem Relation Age of Onset  . Heart disease Father   . Alcohol abuse Sister   . Alcohol abuse Brother   . Coronary artery disease Other        family history     Past Surgical History:  Procedure Laterality Date  . carotid angiogram     2006  . ESOPHAGOGASTRODUODENOSCOPY     with foreign body removal  . NASAL SINUS SURGERY  ~1974;~ 1976  . TONSILLECTOMY     "when I was a chld"    Social History   Socioeconomic History  . Marital status: Divorced    Spouse name: Not on file  . Number of children: 1  . Years of education: Not on file  . Highest education level: Not on file  Occupational History  . Occupation: retired  Employer: RETIRED  Social Needs  . Financial resource strain: Not on file  . Food insecurity:    Worry: Not on file    Inability: Not on file  . Transportation needs:    Medical: Not on file    Non-medical: Not on file  Tobacco Use  . Smoking status: Former Smoker    Packs/day: 0.75    Years: 70.00    Pack years: 52.50    Types: Cigarettes    Last attempt to quit: 07/26/2016    Years since quitting: 1.7  . Smokeless tobacco: Never Used  Substance and Sexual Activity  . Alcohol use: No    Alcohol/week: 0.0 standard drinks  . Drug use: No  . Sexual activity: Never  Lifestyle  . Physical activity:    Days per week: Not on file    Minutes per session: Not on file  . Stress: Not on file  Relationships  . Social connections:    Talks on phone: Not on  file    Gets together: Not on file    Attends religious service: Not on file    Active member of club or organization: Not on file    Attends meetings of clubs or organizations: Not on file    Relationship status: Not on file  . Intimate partner violence:    Fear of current or ex partner: Not on file    Emotionally abused: Not on file    Physically abused: Not on file    Forced sexual activity: Not on file  Other Topics Concern  . Not on file  Social History Narrative  . Not on file     No Known Allergies   Outpatient Medications Prior to Visit  Medication Sig Dispense Refill  . albuterol (VENTOLIN HFA) 108 (90 Base) MCG/ACT inhaler Inhale 1-2 puffs into the lungs every 6 (six) hours as needed for wheezing or shortness of breath. 1 Inhaler 5  . amoxicillin-clavulanate (AUGMENTIN) 875-125 MG tablet Take 1 tablet by mouth 2 (two) times daily. 20 tablet 0  . aspirin EC 81 MG tablet Take 81 mg by mouth daily.    Marland Kitchen atorvastatin (LIPITOR) 10 MG tablet TAKE 1 TABLET(10 MG) BY MOUTH DAILY AT 6 PM 90 tablet 3  . benazepril (LOTENSIN) 10 MG tablet Take 10 mg by mouth 2 (two) times daily.     . budesonide-formoterol (SYMBICORT) 160-4.5 MCG/ACT inhaler INHALE 2 PUFFS INTO THE LUNGS TWICE DAILY 10.2 g 6  . carvedilol (COREG) 6.25 MG tablet Take 1 tablet (6.25 mg total) by mouth 2 (two) times daily with a meal. 180 tablet 3  . DIGOX 125 MCG tablet TAKE 1/2 TABLET BY MOUTH DAILY 15 tablet 11  . ferrous sulfate 325 (65 FE) MG tablet Take 325 mg by mouth daily with breakfast.    . fluticasone (FLONASE) 50 MCG/ACT nasal spray Place 1 spray into both nostrils daily as needed for allergies.  3  . furosemide (LASIX) 20 MG tablet TAKE 1 TABLET(20 MG) BY MOUTH DAILY 90 tablet 3  . guaiFENesin (MUCINEX) 600 MG 12 hr tablet Take by mouth 2 (two) times daily. With fluids     . INCRUSE ELLIPTA 62.5 MCG/INH AEPB INHALE 1 PUFF INTO THE LUNGS DAILY 30 each 11  . insulin aspart (NOVOLOG) 100 UNIT/ML injection  Inject into the skin daily as needed for high blood sugar (For CBG >300). As needed    . metFORMIN (GLUCOPHAGE-XR) 500 MG 24 hr tablet Take 1,000 mg by mouth 2 (  two) times daily.   5  . Multiple Vitamin (MV-ONE PO) Take 1 tablet by mouth daily.    . predniSONE (DELTASONE) 10 MG tablet 4 tabs for 2 days, then 3 tabs for 2 days, 2 tabs for 2 days, then 1 tab for 2 days, then stop 20 tablet 0  . promethazine-codeine (PHENERGAN WITH CODEINE) 6.25-10 MG/5ML syrup Take 5 mLs by mouth 2 (two) times daily.    Marland Kitchen Spacer/Aero-Holding Chambers (AEROCHAMBER MV) inhaler Use as instructed 1 each 0  . UNABLE TO FIND Med Name: Med pass 120 mL by mouth 3 times daily     No facility-administered medications prior to visit.     Review of Systems  Constitutional: Negative for chills, fever, malaise/fatigue and weight loss.  HENT: Negative for hearing loss, sore throat and tinnitus.   Eyes: Negative for blurred vision and double vision.  Respiratory: Positive for cough, sputum production, shortness of breath and wheezing. Negative for hemoptysis and stridor.   Cardiovascular: Negative for chest pain, palpitations, orthopnea, leg swelling and PND.  Gastrointestinal: Negative for abdominal pain, constipation, diarrhea, heartburn, nausea and vomiting.  Genitourinary: Negative for dysuria, hematuria and urgency.  Musculoskeletal: Negative for joint pain and myalgias.  Skin: Negative for itching and rash.  Neurological: Negative for dizziness, tingling, weakness and headaches.  Endo/Heme/Allergies: Negative for environmental allergies. Does not bruise/bleed easily.  Psychiatric/Behavioral: Negative for depression. The patient is not nervous/anxious and does not have insomnia.   All other systems reviewed and are negative.    Objective:  Physical Exam Vitals signs reviewed.  Constitutional:      General: She is not in acute distress.    Appearance: She is well-developed.  HENT:     Head: Normocephalic and  atraumatic.     Comments: Temporalis muscle wasting    Mouth/Throat:     Pharynx: No oropharyngeal exudate.     Comments: Thrush Eyes:     Conjunctiva/sclera: Conjunctivae normal.     Pupils: Pupils are equal, round, and reactive to light.  Neck:     Vascular: No JVD.     Trachea: No tracheal deviation.     Comments: Loss of supraclavicular fat Cardiovascular:     Rate and Rhythm: Normal rate and regular rhythm.     Heart sounds: S1 normal and S2 normal.     Comments: Distant heart tones Pulmonary:     Effort: No tachypnea or accessory muscle usage.     Breath sounds: No stridor. Decreased breath sounds (throughout all lung fields) and wheezing present. No rhonchi or rales.  Abdominal:     General: Bowel sounds are normal. There is no distension.     Palpations: Abdomen is soft.     Tenderness: There is no abdominal tenderness.  Musculoskeletal:        General: Deformity (muscle wasting ) present.  Skin:    General: Skin is warm and dry.     Capillary Refill: Capillary refill takes less than 2 seconds.     Findings: No rash.  Neurological:     Mental Status: She is alert and oriented to person, place, and time.  Psychiatric:        Behavior: Behavior normal.      Vitals:   05/04/18 1118 05/04/18 1119  BP: 140/70 140/70  Pulse: 63 63  SpO2: 94% 94%  Weight: 84 lb (38.1 kg) 84 lb (38.1 kg)  Height:  5\' 2"  (1.575 m)   94% on 3 LPM  BMI Readings from Last  3 Encounters:  05/04/18 15.36 kg/m  04/24/18 15.36 kg/m  12/20/17 16.35 kg/m   Wt Readings from Last 3 Encounters:  05/04/18 84 lb (38.1 kg)  04/24/18 84 lb (38.1 kg)  12/20/17 89 lb 6.4 oz (40.6 kg)     CBC    Component Value Date/Time   WBC 8.8 09/14/2016 1237   RBC 3.47 (L) 09/14/2016 1237   HGB 10.7 (L) 09/14/2016 1237   HGB 12.6 05/24/2016 0924   HCT 32.7 (L) 09/14/2016 1237   HCT 37.9 05/24/2016 0924   PLT 267.0 09/14/2016 1237   PLT 241 05/24/2016 0924   MCV 94.1 09/14/2016 1237   MCV 95  05/24/2016 0924   MCH 29.7 07/30/2016 0408   MCHC 32.7 09/14/2016 1237   RDW 16.7 (H) 09/14/2016 1237   RDW 13.8 05/24/2016 0924   LYMPHSABS 2.7 09/14/2016 1237   MONOABS 1.1 (H) 09/14/2016 1237   EOSABS 0.1 09/14/2016 1237   BASOSABS 0.1 09/14/2016 1237   Lab Results  Component Value Date   CREATININE 0.60 09/14/2016   BUN 20 09/14/2016   NA 139 09/14/2016   K 4.0 09/14/2016   CL 94 (L) 09/14/2016   CO2 35 (H) 09/14/2016     Chest Imaging: Chest x-ray 04/24/2018: Left basilar infiltrate. The patient's images have been independently reviewed by me.    Pulmonary Functions Testing Results: No flowsheet data found.  Office Spiro: FEV1 0.7L, 44% predicted  FeNO: None   Pathology: None   Echocardiogram: None   Heart Catheterization:  07/2016: Study Conclusions - Left ventricle: Diffuse hypokinesis abnormal septal motion The   cavity size was mildly dilated. Wall thickness was normal.   Systolic function was mildly to moderately reduced. The estimated   ejection fraction was in the range of 40% to 45%. - Aortic valve: Valve area (VTI): 1.49 cm^2. Valve area (Vmax):   1.61 cm^2. Valve area (Vmean): 1.46 cm^2. - Aorta: calcification of the intervalvular fibrosa and aortic   sinus. - Mitral valve: There was mild regurgitation. - Atrial septum: A patent foramen ovale cannot be excluded.    Assessment & Plan:   COPD mixed type (HCC) - Plan: Ambulatory Referral for DME, DG Chest 2 View  Underweight  Protein-calorie malnutrition, severe  Thrush  Cachexia (HCC)  Centrilobular emphysema (HCC)  Chronic respiratory failure with hypoxia (HCC)  Chronic diastolic heart failure (HCC)  Pneumonia of left lower lobe due to infectious organism [facility](HCC)   Discussion:  This is a 85 year old chronically ill-appearing lady with severe protein calorie malnutrition, significant cachexia likely related to severe COPD and chronic hypoxemic respiratory failure, BMI of 15 with a  recent left lower lobe pneumonia completing therapy of Augmentin and steroids.  She is currently maintained on Symbicort and Incruse for the management of her COPD.  We will recommend the following: Clotrimazole troches 5 times a day for 5 days for treatment of oral thrush. Encouraged swishing and rinsing of the mouth after use of inhaled steroid. Encouraged food intake as well as dietary supplement for significant cachexia and weight loss. Will give patient Spiriva Respimat samples and discontinue Incruse for the time being to see if she likes this better. Can continue Symbicort at this time. Continue albuterol as needed for shortness of breath. New prescription for albuterol nebulizer as well as albuterol solution to be used for shortness of breath and wheezing as needed. Return to clinic in 6 weeks with Elisha HeadlandBrian Mack with 2 view chest x-ray to ensure clearing and resolution of infiltrate.  Agree with continuation of goals of care discussions and would encourage consideration for durable DNR.  As aggressive resuscitative measures and life support would likely not change outcome in the event of cardiac arrest due to the patient's cardiac and pulmonary comorbidities.  Greater than 50% of this patient's 40-minute office visit was spent face-to-face discussing the above recommendations and  Treatment plan.    Current Outpatient Medications:  .  albuterol (VENTOLIN HFA) 108 (90 Base) MCG/ACT inhaler, Inhale 1-2 puffs into the lungs every 6 (six) hours as needed for wheezing or shortness of breath., Disp: 1 Inhaler, Rfl: 5 .  amoxicillin-clavulanate (AUGMENTIN) 875-125 MG tablet, Take 1 tablet by mouth 2 (two) times daily., Disp: 20 tablet, Rfl: 0 .  aspirin EC 81 MG tablet, Take 81 mg by mouth daily., Disp: , Rfl:  .  atorvastatin (LIPITOR) 10 MG tablet, TAKE 1 TABLET(10 MG) BY MOUTH DAILY AT 6 PM, Disp: 90 tablet, Rfl: 3 .  benazepril (LOTENSIN) 10 MG tablet, Take 10 mg by mouth 2 (two) times  daily. , Disp: , Rfl:  .  budesonide-formoterol (SYMBICORT) 160-4.5 MCG/ACT inhaler, INHALE 2 PUFFS INTO THE LUNGS TWICE DAILY, Disp: 10.2 g, Rfl: 6 .  carvedilol (COREG) 6.25 MG tablet, Take 1 tablet (6.25 mg total) by mouth 2 (two) times daily with a meal., Disp: 180 tablet, Rfl: 3 .  DIGOX 125 MCG tablet, TAKE 1/2 TABLET BY MOUTH DAILY, Disp: 15 tablet, Rfl: 11 .  ferrous sulfate 325 (65 FE) MG tablet, Take 325 mg by mouth daily with breakfast., Disp: , Rfl:  .  fluticasone (FLONASE) 50 MCG/ACT nasal spray, Place 1 spray into both nostrils daily as needed for allergies., Disp: , Rfl: 3 .  furosemide (LASIX) 20 MG tablet, TAKE 1 TABLET(20 MG) BY MOUTH DAILY, Disp: 90 tablet, Rfl: 3 .  guaiFENesin (MUCINEX) 600 MG 12 hr tablet, Take by mouth 2 (two) times daily. With fluids , Disp: , Rfl:  .  INCRUSE ELLIPTA 62.5 MCG/INH AEPB, INHALE 1 PUFF INTO THE LUNGS DAILY, Disp: 30 each, Rfl: 11 .  insulin aspart (NOVOLOG) 100 UNIT/ML injection, Inject into the skin daily as needed for high blood sugar (For CBG >300). As needed, Disp: , Rfl:  .  metFORMIN (GLUCOPHAGE-XR) 500 MG 24 hr tablet, Take 1,000 mg by mouth 2 (two) times daily. , Disp: , Rfl: 5 .  Multiple Vitamin (MV-ONE PO), Take 1 tablet by mouth daily., Disp: , Rfl:  .  predniSONE (DELTASONE) 10 MG tablet, 4 tabs for 2 days, then 3 tabs for 2 days, 2 tabs for 2 days, then 1 tab for 2 days, then stop, Disp: 20 tablet, Rfl: 0 .  promethazine-codeine (PHENERGAN WITH CODEINE) 6.25-10 MG/5ML syrup, Take 5 mLs by mouth 2 (two) times daily., Disp: , Rfl:  .  Spacer/Aero-Holding Chambers (AEROCHAMBER MV) inhaler, Use as instructed, Disp: 1 each, Rfl: 0 .  UNABLE TO FIND, Med Name: Med pass 120 mL by mouth 3 times daily, Disp: , Rfl:    Josephine IgoBradley L Yeraldi Fidler, DO [facility] 05/04/2018 11:44 AM

## 2018-05-04 NOTE — Addendum Note (Signed)
Addended by: Kerin Ransom on: 05/04/2018 01:00 PM   Modules accepted: Orders

## 2018-05-04 NOTE — Patient Instructions (Addendum)
Thank you for visiting Dr. Tonia Brooms at [facility]. Today we recommend the following: Orders Placed This Encounter  Procedures  . DG Chest 2 View  . Ambulatory Referral for DME   Meds ordered this encounter  Medications  . albuterol (PROVENTIL) (2.5 MG/3ML) 0.083% nebulizer solution    Sig: Take 3 mLs (2.5 mg total) by nebulization every 4 (four) hours as needed for wheezing or shortness of breath.    Dispense:  120 mL    Refill:  5    Please run through Medicare Part Dx. J44.9  . clotrimazole (MYCELEX) 10 MG troche    Sig: Take 1 tablet (10 mg total) by mouth 5 (five) times daily for 5 days.    Dispense:  25 tablet    Refill:  0  . Tiotropium Bromide Monohydrate (SPIRIVA RESPIMAT) 2.5 MCG/ACT AERS    Sig: Inhale 2 puffs into the lungs daily.    Dispense:  1 Inhaler    Refill:  0   Return in about 6 weeks (around 06/15/2018). F/U in 6 weeks with Elisha Headland NP with CXR prior.

## 2018-05-09 DIAGNOSIS — H903 Sensorineural hearing loss, bilateral: Secondary | ICD-10-CM | POA: Diagnosis not present

## 2018-05-09 DIAGNOSIS — H9113 Presbycusis, bilateral: Secondary | ICD-10-CM | POA: Diagnosis not present

## 2018-05-24 ENCOUNTER — Telehealth: Payer: Self-pay | Admitting: Pulmonary Disease

## 2018-05-24 MED ORDER — TIOTROPIUM BROMIDE MONOHYDRATE 2.5 MCG/ACT IN AERS: 2.0000 | INHALATION_SPRAY | Freq: Every day | RESPIRATORY_TRACT | 5 refills | Status: DC

## 2018-05-24 NOTE — Telephone Encounter (Signed)
Called and spoke to pt's daughter, [name](DPR).  [name] is requesting Rx for Spiriva 2.5, as pt feels this medication is effective.  Rx for Spiriva 2.5 has been sent to preferred pharmacy.  Nothing further is needed.

## 2018-05-25 NOTE — Telephone Encounter (Signed)
PA received from [place].  Paper filled out.  Needs a supporting statement from Dr. Tonia Brooms.  In his box.  Fax after statement.

## 2018-05-30 ENCOUNTER — Other Ambulatory Visit: Payer: Self-pay

## 2018-05-30 MED ORDER — BUDESONIDE-FORMOTEROL FUMARATE 160-4.5 MCG/ACT IN AERO: INHALATION_SPRAY | RESPIRATORY_TRACT | 0 refills | Status: DC

## 2018-05-30 MED ORDER — BUDESONIDE-FORMOTEROL FUMARATE 160-4.5 MCG/ACT IN AERO
INHALATION_SPRAY | RESPIRATORY_TRACT | 0 refills | Status: DC
Start: 2018-05-30 — End: 2018-05-30

## 2018-05-30 NOTE — Telephone Encounter (Signed)
Call made to patient daughter [name], made her aware tier exception paper work and supporting documentation has been faxed and we are awaiting a decision. Voiced understanding.

## 2018-06-04 ENCOUNTER — Encounter: Payer: Self-pay | Admitting: Neurology

## 2018-06-04 ENCOUNTER — Ambulatory Visit (INDEPENDENT_AMBULATORY_CARE_PROVIDER_SITE_OTHER): Payer: Medicare Other | Admitting: Neurology

## 2018-06-04 DIAGNOSIS — F0280 Dementia in other diseases classified elsewhere without behavioral disturbance: Secondary | ICD-10-CM

## 2018-06-04 DIAGNOSIS — G301 Alzheimer's disease with late onset: Secondary | ICD-10-CM | POA: Diagnosis not present

## 2018-06-04 DIAGNOSIS — G309 Alzheimer's disease, unspecified: Secondary | ICD-10-CM

## 2018-06-04 HISTORY — DX: Dementia in other diseases classified elsewhere, unspecified severity, without behavioral disturbance, psychotic disturbance, mood disturbance, and anxiety: F02.80

## 2018-06-04 MED ORDER — MEMANTINE HCL 5 MG PO TABS: ORAL_TABLET | ORAL | 0 refills | Status: DC

## 2018-06-04 NOTE — Patient Instructions (Signed)
We will start on Namenda, starting on a low dose and going up gradually. Look out for dizziness.

## 2018-06-04 NOTE — Progress Notes (Signed)
Reason for visit: Memory disturbance  Referring physician: Dr. Elyn Peers is a 85 y.o. female  History of present illness:  Ms. Elander is an 85 year old right-handed black female with a history of progressive memory disturbance going back at least 5 years.  The patient has lived with her daughter for over a decade, 5 years ago she started requiring assistance keeping up with medications and appointments, she was making mistakes in this regard.  She has also needed to have her daughter manage her finances for the same reason.  The patient has gotten particularly bad with her short-term memory over the last 8 months, she is repeating herself frequently.  She has lost the ability to control the bowels and the bladder, she wears adult diapers.  She is on oxygen at home.  She sleeps fairly well at night but she does not eat well during the day.  She has struggled to maintain her weight.  She has had a history of a low vitamin B12 level, this was recently checked and the daughter reports that this was unremarkable.  The patient is had a thyroid study done recently and she has had MRI of the brain done about a year and a half ago which showed a mild level small vessel disease.  The patient does have some hearing loss, she has been seen by ENT previously.  The patient reports no family history of memory problems.  She comes to the office today for an evaluation.  Past Medical History:  Diagnosis Date  . Acute bronchitis   . Acute on chronic respiratory failure with hypoxia (HCC)   . Benign hypertensive renal disease   . CAD (coronary artery disease)   . Cardiomyopathy    non-ischemic 04/2008 EF 45% cardiac MRI no scar  . CHF (congestive heart failure) (HCC)   . Chronic sinusitis   . COPD (chronic obstructive pulmonary disease) (HCC)   . COPD exacerbation (HCC)   . Depression   . Diabetes mellitus without complication, without long-term current use of insulin (HCC)   . Dyslipidemia  associated with type 2 diabetes mellitus (HCC)   . Dyspnea on exertion    chronic  . Heart disease   . Hypercholesterolemia   . Hypertension   . On home oxygen therapy    "3L; 24/7; for the last 2 1/2 weeks; none before that" (07/26/2016)  . Protein-calorie malnutrition, severe (HCC)   . Sinus headache    "chronic"  . Stroke [facility]) 1997   "mild"  . Type II diabetes mellitus (HCC)     Past Surgical History:  Procedure Laterality Date  . carotid angiogram     2006  . ESOPHAGOGASTRODUODENOSCOPY     with foreign body removal  . NASAL SINUS SURGERY  ~1974;~ 1976  . TONSILLECTOMY     "when I was a chld"    Family History  Problem Relation Age of Onset  . Heart disease Father   . Alcohol abuse Sister   . Alcohol abuse Brother   . Coronary artery disease Other        family history    Social history:  reports that she quit smoking about 22 months ago. Her smoking use included cigarettes. She has a 52.50 pack-year smoking history. She has never used smokeless tobacco. She reports that she does not drink alcohol or use drugs.  Medications:  Prior to Admission medications   Medication Sig Start Date End Date Taking? Authorizing Provider  albuterol (PROVENTIL) (  2.5 MG/3ML) 0.083% nebulizer solution Take 3 mLs (2.5 mg total) by nebulization every 4 (four) hours as needed for wheezing or shortness of breath. 05/04/18  Yes Icard, Bradley L, DO  albuterol (VENTOLIN HFA) 108 (90 Base) MCG/ACT inhaler Inhale 1-2 puffs into the lungs every 6 (six) hours as needed for wheezing or shortness of breath. 10/20/17  Yes Michele Mcalpine, MD  amoxicillin-clavulanate (AUGMENTIN) 875-125 MG tablet Take 1 tablet by mouth 2 (two) times daily. 04/24/18  Yes Coral Ceo, NP  aspirin EC 81 MG tablet Take 81 mg by mouth daily.   Yes [provider]  atorvastatin (LIPITOR) 10 MG tablet TAKE 1 TABLET(10 MG) BY MOUTH DAILY AT 6 PM 06/27/17  Yes Rosalio Macadamia, NP  benazepril (LOTENSIN) 10 MG tablet Take  10 mg by mouth 2 (two) times daily.    Yes [provider]  budesonide-formoterol (SYMBICORT) 160-4.5 MCG/ACT inhaler INHALE 2 PUFFS INTO THE LUNGS TWICE DAILY 05/30/18  Yes Icard, Bradley L, DO  carvedilol (COREG) 6.25 MG tablet Take 1 tablet (6.25 mg total) by mouth 2 (two) times daily with a meal. 05/24/16  Yes Rosalio Macadamia, NP  DIGOX 125 MCG tablet TAKE 1/2 TABLET BY MOUTH DAILY 10/09/17  Yes Wendall Stade, MD  ferrous sulfate 325 (65 FE) MG tablet Take 325 mg by mouth daily with breakfast.   Yes [provider]  fluticasone (FLONASE) 50 MCG/ACT nasal spray Place 1 spray into both nostrils daily as needed for allergies. 06/09/16  Yes [provider]  furosemide (LASIX) 20 MG tablet TAKE 1 TABLET(20 MG) BY MOUTH DAILY 10/20/17  Yes Wendall Stade, MD  guaiFENesin (MUCINEX) 600 MG 12 hr tablet Take by mouth 2 (two) times daily. With fluids    Yes [provider]  INCRUSE ELLIPTA 62.5 MCG/INH AEPB INHALE 1 PUFF INTO THE LUNGS DAILY 10/05/17  Yes Michele Mcalpine, MD  insulin aspart (NOVOLOG) 100 UNIT/ML injection Inject into the skin daily as needed for high blood sugar (For CBG >300). As needed   Yes [provider]  metFORMIN (GLUCOPHAGE-XR) 500 MG 24 hr tablet Take 1,000 mg by mouth 2 (two) times daily.  03/13/15  Yes [provider]  Multiple Vitamin (MV-ONE PO) Take 1 tablet by mouth daily.   Yes [provider]  predniSONE (DELTASONE) 10 MG tablet 4 tabs for 2 days, then 3 tabs for 2 days, 2 tabs for 2 days, then 1 tab for 2 days, then stop 04/24/18  Yes Mack, Art Buff, NP  promethazine-codeine (PHENERGAN WITH CODEINE) 6.25-10 MG/5ML syrup Take 5 mLs by mouth 2 (two) times daily.   Yes [provider]  Tiotropium Bromide Monohydrate (SPIRIVA RESPIMAT) 2.5 MCG/ACT AERS Inhale 2 puffs into the lungs daily. 05/24/18  Yes Icard, Rachel Bo, DO  UNABLE TO FIND Med Name: Med pass 120 mL by mouth 3 times daily   Yes [provider]  Spacer/Aero-Holding Chambers (AEROCHAMBER MV) inhaler Use as instructed Patient not taking: Reported on 06/04/2018 02/21/17   Michele Mcalpine, MD     No Known Allergies  ROS:  Out of a complete 14 system review of symptoms, the patient complains only of the following symptoms, and all other reviewed systems are negative.  Hearing loss Skin rash Incontinence of the bowels and bladder Memory loss, confusion Runny nose  Blood pressure (!) 148/62, pulse 86, height 5\' 2"  (1.575 m), weight 86 lb (39 kg).  Physical Exam  General: The patient  is alert and cooperative at the time of the examination.  The patient appears to be quite thin.  Eyes: Pupils are equal, round, and reactive to light. Discs are flat bilaterally.  Neck: The neck is supple, no carotid bruits are noted.  Respiratory: The respiratory examination is clear.  Cardiovascular: The cardiovascular examination reveals a regular rate and rhythm, no obvious murmurs or rubs are noted.  Skin: Extremities are without significant edema.  Neurologic Exam  Mental status: The patient is alert and oriented x 2 at the time of the examination (not oriented to date). The Mini-Mental status examination done today shows a total score 20/30.  Cranial nerves: Facial symmetry is present. There is good sensation of the face to pinprick and soft touch bilaterally. The strength of the facial muscles and the muscles to head turning and shoulder shrug are normal bilaterally. Speech is well enunciated, no aphasia or dysarthria is noted. Extraocular movements are full. Visual fields are full. The tongue is midline, and the patient has symmetric elevation of the soft palate. No obvious hearing deficits are noted.  Motor: The motor testing reveals 5 over 5 strength of all 4 extremities. Good symmetric motor tone is noted throughout.  Sensory: Sensory testing is intact to pinprick, soft touch, vibration sensation, and position sense on  all 4 extremities. No evidence of extinction is noted.  Coordination: Cerebellar testing reveals good finger-nose-finger and heel-to-shin bilaterally.  Gait and station: Gait is normal. Romberg is negative. No drift is seen.  Reflexes: Deep tendon reflexes are symmetric, but are depressed bilaterally. Toes are downgoing bilaterally.   MRI brain 01/23/17:  IMPRESSION: 1. 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 2. No acute intracranial abnormality. 3. Small amount of left mastoid fluid.  * MRI scan images were reviewed online. I agree with the written report.    Assessment/Plan:  1.  Progressive memory disturbance, Alzheimer's disease  The patient does have some mild small vessel disease by MRI of the brain.  She has had recent blood work done to include a thyroid profile and B12 level.  We have discussed medications for memory, I would not start Aricept or Exelon given her problem maintaining her weight and with her urinary and fecal incontinence.  The patient will be placed on Namenda and work-up gradually on the dose.  They will call for any concerns, she will follow-up in 6 months.  Marlan Palau MD 06/04/2018 2:52 PM  [facility] [address] [place], [place] [address]  Phone [phone] Fax [phone]

## 2018-06-06 NOTE — Telephone Encounter (Signed)
Fax received from [facility] Spiriva Respimat 2.5 mcg has been approved. Approved quantity of 4 grams per 30 days. Patient can get 90 day supply except for specialty tier 5 medications, which would have to be a 30 day supply. Called pharmacy to advise. They confirmed the prescription is ready for pick up. Spoke with patient daughter [name] to let her know. She voiced understanding, nothing further needed at this time.

## 2018-06-14 NOTE — Progress Notes (Signed)
@Patient  ID: [name], female    DOB: 1929-08-30, 85 y.o.   MRN: [id number]  Chief Complaint  Patient presents with  . Follow-up    follow-up for COPD with chest x-ray    Referring provider: [name], MD  HPI:  85 year old female former smoker (quit April/2018) followed in our office with severe COPD and emphysema, pulmonary hypertension  PMH: Diastolic congestive heart failure, PAF, underweight and failure to thrive Smoker/ Smoking History: Former smoker.  Greater than 65-pack-year smoking history.  Quit April/2018. Maintenance: Symbicort 160, Spiriva 2.5 Pt of: Dr. Tonia Brooms  06/15/2018  - Visit   85 year old female former smoker followed in our office for COPD presenting today for a follow-up visit.  Patient reporting that she has done well transitioning from Incruse Ellipta to Spiriva Respimat 2.5.  Patient is also maintained on Symbicort 160.  Patient has stopped Incruse Ellipta use.  Patient uses her rescue inhaler about 1 time daily and about 2 times weekly for nebulizer use.  Patient and daughter who is with patient today reports that cough has improved significantly as well as improve shortness of breath since December/2019 office visit.  Patient completed chest x-ray today to follow the pneumonia that was treated at December/2019 office visit.  MMRC - Breathlessness Score 2 - on level ground, I walk slower than people of the same age because of breathlessness, or have to stop for breathe when walking to my own pace   Tests:   04/24/18-chest x-ray-mild left base infiltrate, findings suggestive of mild left base pneumonia  10/20/2017-CT chest without contrast-no acute abnormality, no evidence of pneumonia, mild centrilobular emphysema  07/29/2016-echocardiogram-LV ejection fraction 40 to 45%, diffuse hypokinesis abnormal septal motion, cavity size is mildly elevated dilated, mild mitral valve regurgitation  FENO:  No results found for: NITRICOXIDE  PFT: No  flowsheet data found.  Imaging: No results found.    Specialty Problems      Pulmonary Problems   Acute bronchitis   Acute on chronic respiratory failure with hypoxia (HCC)   Chronic obstructive pulmonary disease (HCC)   COPD mixed type (HCC)    05/27/2015-office spirometry-FVC 1.4 (66% predicted), ratio 48, FEV1 0.7 (44% predicted)  10/20/2017-CT chest without contrast-no acute abnormality, no evidence of pneumonia, mild centrilobular emphysema      COPD with acute exacerbation (HCC)      No Known Allergies  Immunization History  Administered Date(s) Administered  . Influenza Split 01/23/2017  . Influenza, High Dose Seasonal PF 02/05/2018  . Influenza,inj,Quad PF,6+ Mos 01/24/2015, 01/24/2016  . Pneumococcal-Unspecified 12/25/2014   Up-to-date  Past Medical History:  Diagnosis Date  . Acute bronchitis   . Acute on chronic respiratory failure with hypoxia (HCC)   . Alzheimer disease (HCC) 06/04/2018  . Benign hypertensive renal disease   . CAD (coronary artery disease)   . Cardiomyopathy    non-ischemic 04/2008 EF 45% cardiac MRI no scar  . CHF (congestive heart failure) (HCC)   . Chronic sinusitis   . COPD (chronic obstructive pulmonary disease) (HCC)   . COPD exacerbation (HCC)   . Depression   . Diabetes mellitus without complication, without long-term current use of insulin (HCC)   . Dyslipidemia associated with type 2 diabetes mellitus (HCC)   . Dyspnea on exertion    chronic  . Heart disease   . Hypercholesterolemia   . Hypertension   . On home oxygen therapy    "3L; 24/7; for the last 2 1/2 weeks; none before that" (07/26/2016)  .  Protein-calorie malnutrition, severe (HCC)   . Sinus headache    "chronic"  . Stroke [facility](HCC) 1997   "mild"  . Type II diabetes mellitus (HCC)     Tobacco History: Social History   Tobacco Use  Smoking Status Former Smoker  . Packs/day: 0.75  . Years: 70.00  . Pack years: 52.50  . Types: Cigarettes  . Start date:  12/13/1948  . Last attempt to quit: 07/26/2016  . Years since quitting: 1.8  Smokeless Tobacco Never Used   Counseling given: Not Answered   Continue to not smoke   Outpatient Encounter Medications as of 06/15/2018  Medication Sig  . albuterol (PROVENTIL) (2.5 MG/3ML) 0.083% nebulizer solution Take 3 mLs (2.5 mg total) by nebulization every 4 (four) hours as needed for wheezing or shortness of breath.  Marland Kitchen. albuterol (VENTOLIN HFA) 108 (90 Base) MCG/ACT inhaler Inhale 1-2 puffs into the lungs every 6 (six) hours as needed for wheezing or shortness of breath.  Marland Kitchen. amoxicillin-clavulanate (AUGMENTIN) 875-125 MG tablet Take 1 tablet by mouth 2 (two) times daily.  Marland Kitchen. aspirin EC 81 MG tablet Take 81 mg by mouth daily.  Marland Kitchen. atorvastatin (LIPITOR) 10 MG tablet TAKE 1 TABLET(10 MG) BY MOUTH DAILY AT 6 PM  . benazepril (LOTENSIN) 10 MG tablet Take 10 mg by mouth 2 (two) times daily.   . budesonide-formoterol (SYMBICORT) 160-4.5 MCG/ACT inhaler INHALE 2 PUFFS INTO THE LUNGS TWICE DAILY  . carvedilol (COREG) 6.25 MG tablet Take 1 tablet (6.25 mg total) by mouth 2 (two) times daily with a meal.  . DIGOX 125 MCG tablet TAKE 1/2 TABLET BY MOUTH DAILY  . ferrous sulfate 325 (65 FE) MG tablet Take 325 mg by mouth daily with breakfast.  . fluticasone (FLONASE) 50 MCG/ACT nasal spray Place 1 spray into both nostrils daily as needed for allergies.  . furosemide (LASIX) 20 MG tablet TAKE 1 TABLET(20 MG) BY MOUTH DAILY  . guaiFENesin (MUCINEX) 600 MG 12 hr tablet Take by mouth 2 (two) times daily. With fluids   . insulin aspart (NOVOLOG) 100 UNIT/ML injection Inject into the skin daily as needed for high blood sugar (For CBG >300). As needed  . memantine (NAMENDA) 5 MG tablet Take 1 tablet daily for one week, then take 1 tablet twice daily for one week, then take 1 tablet in the morning and 2 in the evening for one week, then take 2 tablets twice daily  . metFORMIN (GLUCOPHAGE-XR) 500 MG 24 hr tablet Take 1,000 mg by mouth  2 (two) times daily.   . Multiple Vitamin (MV-ONE PO) Take 1 tablet by mouth daily.  . predniSONE (DELTASONE) 10 MG tablet 4 tabs for 2 days, then 3 tabs for 2 days, 2 tabs for 2 days, then 1 tab for 2 days, then stop  . promethazine-codeine (PHENERGAN WITH CODEINE) 6.25-10 MG/5ML syrup Take 5 mLs by mouth 2 (two) times daily.  Marland Kitchen. Spacer/Aero-Holding Chambers (AEROCHAMBER MV) inhaler Use as instructed  . Tiotropium Bromide Monohydrate (SPIRIVA RESPIMAT) 2.5 MCG/ACT AERS Inhale 2 puffs into the lungs daily.  Marland Kitchen. UNABLE TO FIND Med Name: Med pass 120 mL by mouth 3 times daily  . INCRUSE ELLIPTA 62.5 MCG/INH AEPB INHALE 1 PUFF INTO THE LUNGS DAILY (Patient not taking: Reported on 06/15/2018)   No facility-administered encounter medications on file as of 06/15/2018.      Review of Systems  Review of Systems  Constitutional: Negative for appetite change, chills, fatigue and unexpected weight change.  HENT: Negative for congestion, postnasal  drip, sinus pressure and sinus pain.   Eyes:       Left stye   Respiratory: Positive for shortness of breath (occasional ). Negative for cough, chest tightness and wheezing.   Cardiovascular: Negative for chest pain and palpitations.  Gastrointestinal: Negative for diarrhea, nausea and vomiting.  Musculoskeletal: Negative for arthralgias.  Skin: Negative for color change.  Allergic/Immunologic: Negative for environmental allergies and food allergies.  Neurological: Negative for dizziness, light-headedness and headaches.  Psychiatric/Behavioral: Negative for dysphoric mood. The patient is not nervous/anxious.   All other systems reviewed and are negative.    Physical Exam  BP 128/60 (BP Location: Right Arm, Cuff Size: Normal)   Pulse 69   Ht 5\' 2"  (1.575 m)   Wt 85 lb 6.4 oz (38.7 kg)   SpO2 97%   BMI 15.62 kg/m   Wt Readings from Last 5 Encounters:  06/15/18 85 lb 6.4 oz (38.7 kg)  06/04/18 86 lb (39 kg)  05/04/18 84 lb (38.1 kg)  04/24/18 84 lb  (38.1 kg)  12/20/17 89 lb 6.4 oz (40.6 kg)    Physical Exam  Constitutional: She is oriented to person, place, and time and well-developed, well-nourished, and in no distress. Vital signs are normal. She appears cachectic. No distress.  +thin cachetic elderly female  HENT:  Head: Normocephalic and atraumatic.  Right Ear: Hearing, tympanic membrane, external ear and ear canal normal.  Left Ear: Hearing, tympanic membrane, external ear and ear canal normal.  Nose: Nose normal. Right sinus exhibits no maxillary sinus tenderness and no frontal sinus tenderness. Left sinus exhibits no maxillary sinus tenderness and no frontal sinus tenderness.  Mouth/Throat: Uvula is midline and oropharynx is clear and moist. She has dentures. No oropharyngeal exudate.  Eyes: Pupils are equal, round, and reactive to light.  Neck: Normal range of motion. Neck supple. No JVD present.  Cardiovascular: Normal rate, regular rhythm and normal heart sounds.  Pulmonary/Chest: Effort normal and breath sounds normal. No accessory muscle usage. No respiratory distress. She has no decreased breath sounds. She has no wheezes. She has no rhonchi.  Musculoskeletal: Normal range of motion.        General: No edema.  Lymphadenopathy:    She has no cervical adenopathy.  Neurological: She is alert and oriented to person, place, and time. Gait normal.  Skin: Skin is warm and dry. She is not diaphoretic. No erythema.  Psychiatric: Mood, memory, affect and judgment normal.  Nursing note and vitals reviewed.     Lab Results:  CBC    Component Value Date/Time   WBC 8.8 09/14/2016 1237   RBC 3.47 (L) 09/14/2016 1237   HGB 10.7 (L) 09/14/2016 1237   HGB 12.6 05/24/2016 0924   HCT 32.7 (L) 09/14/2016 1237   HCT 37.9 05/24/2016 0924   PLT 267.0 09/14/2016 1237   PLT 241 05/24/2016 0924   MCV 94.1 09/14/2016 1237   MCV 95 05/24/2016 0924   MCH 29.7 07/30/2016 0408   MCHC 32.7 09/14/2016 1237   RDW 16.7 (H) 09/14/2016 1237    RDW 13.8 05/24/2016 0924   LYMPHSABS 2.7 09/14/2016 1237   MONOABS 1.1 (H) 09/14/2016 1237   EOSABS 0.1 09/14/2016 1237   BASOSABS 0.1 09/14/2016 1237    BMET    Component Value Date/Time   NA 139 09/14/2016 1237   NA 135 (A) 08/04/2016   K 4.0 09/14/2016 1237   CL 94 (L) 09/14/2016 1237   CO2 35 (H) 09/14/2016 1237   GLUCOSE 141 (H)  09/14/2016 1237   BUN 20 09/14/2016 1237   BUN 21 08/04/2016   CREATININE 0.60 09/14/2016 1237   CALCIUM 9.9 09/14/2016 1237   GFRNONAA >60 07/30/2016 0408   GFRAA >60 07/30/2016 0408    BNP    Component Value Date/Time   BNP 163.1 (H) 07/26/2016 1222    ProBNP    Component Value Date/Time   PROBNP 70.0 09/14/2016 1237      Assessment & Plan:    COPD mixed type (HCC) Assessment: Office spirometry in 2017 showed an FEV1 of 0.7 (44% predicted) June/2019 CT chest showing mild centrilobular emphysema BMI 15.6 65-pack-year smoking history mMRC 2 today  lungs clear to auscultation today Maintained well on Symbicort 160 and Spiriva Respimat 2.5 Saba use daily  Plan: Continue Symbicort 160 Continue Spiriva Respimat 2.5 Continue rescue inhaler use as needed Continue to use albuterol nebs as needed Follow-up with our office in 3 to 6 months or sooner if symptoms worsen   Protein-calorie malnutrition, severe Assessment: BMI 15.6 Thin cachectic elderly female Reports good appetite as well as supplementing with Ensure or high-protein boost as snacks in between meals  Plan: Continue dietary recommendations as you are currently following Referral to [facility] for severe protein calorie malnutrition     Coral Ceo, NP 06/15/2018   This appointment was 28 min long with over 50% of the time in direct face-to-face patient care, assessment, plan of care, and follow-up.

## 2018-06-15 ENCOUNTER — Encounter: Payer: Self-pay | Admitting: Pulmonary Disease

## 2018-06-15 ENCOUNTER — Ambulatory Visit (INDEPENDENT_AMBULATORY_CARE_PROVIDER_SITE_OTHER): Payer: Medicare Other | Admitting: Pulmonary Disease

## 2018-06-15 ENCOUNTER — Ambulatory Visit (INDEPENDENT_AMBULATORY_CARE_PROVIDER_SITE_OTHER)
Admission: RE | Admit: 2018-06-15 | Discharge: 2018-06-15 | Disposition: A | Discharge status: Home / Self Care | Payer: Medicare Other | Source: Ambulatory Visit | Attending: Pulmonary Disease | Admitting: Pulmonary Disease

## 2018-06-15 VITALS — BP 128/60 | HR 69 | Ht 62.0 in | Wt 85.4 lb

## 2018-06-15 DIAGNOSIS — J449 Chronic obstructive pulmonary disease, unspecified: Secondary | ICD-10-CM | POA: Diagnosis not present

## 2018-06-15 DIAGNOSIS — E43 Unspecified severe protein-calorie malnutrition: Secondary | ICD-10-CM

## 2018-06-15 NOTE — Patient Instructions (Signed)
Continue Symbicort 160 >>> 2 puffs in the morning right when you wake up, rinse out your mouth after use, 12 hours later 2 puffs, rinse after use >>> Take this daily, no matter what >>> This is not a rescue inhaler   Continue Spiriva Respimat 2.5 >>> 2 puffs daily >>> Do this every day >>>This is not a rescue inhaler  Continue albuterol nebs as needed every 6 hours for shortness of breath and wheezing  Only use your albuterol as a rescue medication to be used if you can't catch your breath by resting or doing a relaxed purse lip breathing pattern.  - The less you use it, the better it will work when you need it. - Ok to use up to 2 puffs  every 4 hours if you must but call for immediate appointment if use goes up over your usual need - Don't leave home without it !!  (think of it like the spare tire for your car)   Continue to eat whole nutritious foods and nutrient dense foods to gain weight  Continue to use Ensure or high-protein boost as a snack option in between meals  Note your daily symptoms > remember "red flags" for COPD:   >>>Increase in cough >>>increase in sputum production >>>increase in shortness of breath or activity  intolerance.   If you notice these symptoms, please call the office to be seen.    Follow-up with Dr. Tonia Brooms in 3 to 6 months or sooner if symptoms worsen or he have issues with your breathing    It is flu season:   >>> Best ways to protect herself from the flu: Receive the yearly flu vaccine, practice good hand hygiene washing with soap and also using hand sanitizer when available, eat a nutritious meals, get adequate rest, hydrate appropriately   Please contact the office if your symptoms worsen or you have concerns that you are not improving.   Thank you for choosing [facility] for your healthcare, and for allowing [place] to partner with you on your healthcare journey. I am thankful to be able to provide care to you today.   Elisha Headland  FNP-C

## 2018-06-15 NOTE — Assessment & Plan Note (Signed)
Assessment: Office spirometry in 2017 showed an FEV1 of 0.7 (44% predicted) June/2019 CT chest showing mild centrilobular emphysema BMI 15.6 65-pack-year smoking history mMRC 2 today  lungs clear to auscultation today Maintained well on Symbicort 160 and Spiriva Respimat 2.5 Saba use daily  Plan: Continue Symbicort 160 Continue Spiriva Respimat 2.5 Continue rescue inhaler use as needed Continue to use albuterol nebs as needed Follow-up with our office in 3 to 6 months or sooner if symptoms worsen

## 2018-06-15 NOTE — Assessment & Plan Note (Signed)
Assessment: BMI 15.6 Thin cachectic elderly female Reports good appetite as well as supplementing with Ensure or high-protein boost as snacks in between meals  Plan: Continue dietary recommendations as you are currently following Referral to [facility] for severe protein calorie malnutrition

## 2018-06-16 ENCOUNTER — Other Ambulatory Visit: Payer: Self-pay | Admitting: Nurse Practitioner

## 2018-06-20 NOTE — Progress Notes (Signed)
PCCM: Agree. Thanks for seeing her.  Angela Vazguez L Aurielle Slingerland, DO [facility] 06/20/2018 5:15 PM    

## 2018-06-29 ENCOUNTER — Other Ambulatory Visit: Payer: Self-pay | Admitting: Neurology

## 2018-06-29 MED ORDER — MEMANTINE HCL 10 MG PO TABS: 10.0000 mg | ORAL_TABLET | Freq: Two times a day (BID) | ORAL | 3 refills | Status: DC

## 2018-06-29 NOTE — Telephone Encounter (Signed)
I called pt, spoke to pt's daughter [name], per DPR. Pt has titrated up to memantine 10mg  BID using the 5mg  tablets. Pt is doing well and needs the maintenance dose sent in to [facility].

## 2018-06-29 NOTE — Telephone Encounter (Signed)
The maintenance dose for Namenda prescription was sent in.

## 2018-07-02 ENCOUNTER — Other Ambulatory Visit: Payer: Self-pay

## 2018-07-02 NOTE — Patient Outreach (Signed)
[facility]) Care Management  07/02/2018  DALEISA STREGE [birth year] [id number]   Referral Date: 06/15/2018 Referral Source: MD referral Referral Reason:COPD/malnutrition   Outreach Attempt: Telephone call spoke with DPR [name] primary caregiver.  Discussed reason for the referral.  She states that she was unaware of referral.  Discussed [facility] services and support.  She states she feels they do not need the services right now but appreciative of the call.  She states that patient is seeing a new physician that really does not know the patient.  She states that patient has been 86 lbs for about 4 years and has never been a large person.  She states that she gives the patient supplements of boost and ensure and patient eats 3 meals plus snacks.  Discussed patient's COPD.  She says that they manage the patient's COPD well.  She states patient takes her medications as prescribed and no problems with medications.  RN CM offered letter and brochure.  Daughter is willing to have letter and brochure mailed but again declined need for services at this time.     Plan: RN CM will close case.     Bary Leriche, RN, MSN [facility] Care Management Care Management Coordinator Direct Line [phone] Toll Free: [phone]  Fax: [phone]

## 2018-07-13 ENCOUNTER — Telehealth: Payer: Self-pay | Admitting: Pulmonary Disease

## 2018-07-13 MED ORDER — ALBUTEROL SULFATE HFA 108 (90 BASE) MCG/ACT IN AERS: 1.0000 | INHALATION_SPRAY | Freq: Four times a day (QID) | RESPIRATORY_TRACT | 5 refills | Status: DC | PRN

## 2018-07-13 NOTE — Telephone Encounter (Signed)
Spoke with [name] (DPR) and she stated pt needed a refill on albuterol hfa.  Refill sent to preferred pharmacy.  Nothing further is needed.

## 2018-07-26 ENCOUNTER — Other Ambulatory Visit: Payer: Self-pay

## 2018-08-08 ENCOUNTER — Ambulatory Visit: Payer: Medicare Other | Admitting: Internal Medicine

## 2018-09-05 ENCOUNTER — Other Ambulatory Visit: Payer: Self-pay | Admitting: Nurse Practitioner

## 2018-09-06 ENCOUNTER — Other Ambulatory Visit: Payer: Self-pay

## 2018-09-06 MED ORDER — BUDESONIDE-FORMOTEROL FUMARATE 160-4.5 MCG/ACT IN AERO: INHALATION_SPRAY | RESPIRATORY_TRACT | 2 refills | Status: AC

## 2018-09-20 ENCOUNTER — Ambulatory Visit: Payer: Medicare Other | Admitting: Pulmonary Disease

## 2018-10-06 ENCOUNTER — Other Ambulatory Visit: Payer: Self-pay | Admitting: Cardiovascular Disease

## 2018-10-10 ENCOUNTER — Other Ambulatory Visit: Payer: Self-pay | Admitting: Cardiovascular Disease

## 2018-10-10 MED ORDER — DIGOXIN 125 MCG PO TABS: 62.5000 ug | ORAL_TABLET | Freq: Every day | ORAL | 0 refills | Status: DC

## 2018-10-12 ENCOUNTER — Other Ambulatory Visit: Payer: Self-pay | Admitting: Cardiovascular Disease

## 2018-10-25 ENCOUNTER — Other Ambulatory Visit: Payer: Self-pay | Admitting: Cardiovascular Disease

## 2018-10-31 ENCOUNTER — Other Ambulatory Visit: Payer: Self-pay | Admitting: Cardiovascular Disease

## 2018-10-31 ENCOUNTER — Ambulatory Visit: Payer: Medicare Other | Admitting: Pulmonary Disease

## 2018-10-31 MED ORDER — DIGOXIN 125 MCG PO TABS: 0.0625 mg | ORAL_TABLET | Freq: Every day | ORAL | 0 refills | Status: DC

## 2018-11-01 ENCOUNTER — Ambulatory Visit (INDEPENDENT_AMBULATORY_CARE_PROVIDER_SITE_OTHER): Payer: Medicare Other | Admitting: Pulmonary Disease

## 2018-11-01 ENCOUNTER — Encounter: Payer: Self-pay | Admitting: Pulmonary Disease

## 2018-11-01 ENCOUNTER — Other Ambulatory Visit: Payer: Self-pay

## 2018-11-01 VITALS — BP 122/70 | HR 71 | Ht 63.0 in | Wt 90.2 lb

## 2018-11-01 DIAGNOSIS — R64 Cachexia: Secondary | ICD-10-CM | POA: Diagnosis not present

## 2018-11-01 DIAGNOSIS — I5032 Chronic diastolic (congestive) heart failure: Secondary | ICD-10-CM

## 2018-11-01 DIAGNOSIS — J432 Centrilobular emphysema: Secondary | ICD-10-CM | POA: Diagnosis not present

## 2018-11-01 DIAGNOSIS — J449 Chronic obstructive pulmonary disease, unspecified: Secondary | ICD-10-CM

## 2018-11-01 DIAGNOSIS — J9611 Chronic respiratory failure with hypoxia: Secondary | ICD-10-CM | POA: Diagnosis not present

## 2018-11-01 DIAGNOSIS — R636 Underweight: Secondary | ICD-10-CM

## 2018-11-01 DIAGNOSIS — E43 Unspecified severe protein-calorie malnutrition: Secondary | ICD-10-CM | POA: Diagnosis not present

## 2018-11-01 MED ORDER — BUDESONIDE-FORMOTEROL FUMARATE 160-4.5 MCG/ACT IN AERO: 2.0000 | INHALATION_SPRAY | Freq: Two times a day (BID) | RESPIRATORY_TRACT | 0 refills | Status: DC

## 2018-11-01 MED ORDER — SPIRIVA RESPIMAT 2.5 MCG/ACT IN AERS: 2.0000 | INHALATION_SPRAY | Freq: Every day | RESPIRATORY_TRACT | 0 refills | Status: DC

## 2018-11-01 MED ORDER — BUDESONIDE-FORMOTEROL FUMARATE 160-4.5 MCG/ACT IN AERO: 2.0000 | INHALATION_SPRAY | Freq: Two times a day (BID) | RESPIRATORY_TRACT | 6 refills | Status: DC

## 2018-11-01 NOTE — Progress Notes (Signed)
Synopsis: Referred in January 2020 for follow-up from pneumonia.  By Lujean Amel, MD  Subjective:   PATIENT ID: [name]: female DOB: March 06, 1930, MRN: [id number]  Chief Complaint  Patient presents with  . Follow-up    F/U re: COPD. Daughter came with her to her appt. Currently using 2L of oxyen cont. She voices that she does not have any new concerns.     This is an 85 year old female with a past medical history of chronic hypoxemic respiratory failure from nonischemic cardiomyopathy history of ejection fraction 40 to 45%, severe COPD prior spirometry with FEV1 of 0.7 L.,  BMI 15.  She is here today for follow-up after recent diagnosis of a left basilar pneumonia in December 2019.  She has completed a course of antibiotics.  She is slowly improving.  Daughter is present with her today in the office.  She said her appetite has been poor and she is slowly been trying to increase food intake.  She is still requiring 3 L nasal cannula.  Her cough is still present but slowly improving. We also discussed goals of care today in the office.  She does have significant chronic oxygen dependent lung disease and chronic heart failure which predisposes her to an overall poor prognosis when it would come to resuscitation measures if that was needed.  Patient and family are aware of this.  They would like to discuss amongst family as well as PCP to continue this discussion as they have had this brought up in the past. She has been a patient of Dr. Lenna Gilford for the past several years.  OV 11/01/2018: Patient last seen in our office on 06/15/2018 by Wyn Quaker.  Overall has done well with transition from [place] to Spiriva Respimat.  Also continued maintenance on Symbicort 160. Stable on her current inhalers. Doing a breathing treatement only about 2 times per month.  She is also using Spiriva Respimat.  Both of which are improving her symptoms as compared to her previous inhaler regimen.  Of  note she is celebrating her 89th birthday tomorrow.  We also continue the discussion of goals of care today in the office.  We discussed the risk benefits and alternatives of cardiopulmonary resuscitation.  In a patient with advanced lung disease as the patient has.  Her daughter was present for this discussion.  She did state that after her last visit she had some more discussions with her brothers regarding this.  They do not believe the chest compressions would be appropriate.  They are not yet ready to fill out these paperwork.   Past Medical History:  Diagnosis Date  . Acute bronchitis   . Acute on chronic respiratory failure with hypoxia ([place])   . Alzheimer disease ([place]) 06/04/2018  . Benign hypertensive renal disease   . CAD (coronary artery disease)   . Cardiomyopathy    non-ischemic 04/2008 EF 45% cardiac MRI no scar  . CHF (congestive heart failure) ([place])   . Chronic sinusitis   . COPD (chronic obstructive pulmonary disease) ([place])   . COPD exacerbation ([place])   . Depression   . Diabetes mellitus without complication, without long-term current use of insulin ([place])   . Dyslipidemia associated with type 2 diabetes mellitus ([place])   . Dyspnea on exertion    chronic  . Heart disease   . Hypercholesterolemia   . Hypertension   . On home oxygen therapy    "3L; 24/7; for the last 2 1/2 weeks; none  before that" (07/26/2016)  . Protein-calorie malnutrition, severe (HCC)   . Sinus headache    "chronic"  . Stroke [facility](HCC) 1997   "mild"  . Type II diabetes mellitus (HCC)      Family History  Problem Relation Age of Onset  . Heart disease Father   . Alcohol abuse Sister   . Alcohol abuse Brother   . Coronary artery disease Other        family history     Past Surgical History:  Procedure Laterality Date  . carotid angiogram     2006  . ESOPHAGOGASTRODUODENOSCOPY     with foreign body removal  . NASAL SINUS SURGERY  ~1974;~ 1976  . TONSILLECTOMY     "when I was a chld"     Social History   Socioeconomic History  . Marital status: Divorced    Spouse name: Not on file  . Number of children: 1  . Years of education: Not on file  . Highest education level: Not on file  Occupational History  . Occupation: retired    Associate Professormployer: RETIRED  Social Needs  . Financial resource strain: Not on file  . Food insecurity    Worry: Not on file    Inability: Not on file  . Transportation needs    Medical: Not on file    Non-medical: Not on file  Tobacco Use  . Smoking status: Former Smoker    Packs/day: 0.75    Years: 70.00    Pack years: 52.50    Types: Cigarettes    Start date: 12/13/1948    Quit date: 07/26/2016    Years since quitting: 2.2  . Smokeless tobacco: Never Used  Substance and Sexual Activity  . Alcohol use: No    Alcohol/week: 0.0 standard drinks  . Drug use: No  . Sexual activity: Never  Lifestyle  . Physical activity    Days per week: Not on file    Minutes per session: Not on file  . Stress: Not on file  Relationships  . Social Musicianconnections    Talks on phone: Not on file    Gets together: Not on file    Attends religious service: Not on file    Active member of club or organization: Not on file    Attends meetings of clubs or organizations: Not on file    Relationship status: Not on file  . Intimate partner violence    Fear of current or ex partner: Not on file    Emotionally abused: Not on file    Physically abused: Not on file    Forced sexual activity: Not on file  Other Topics Concern  . Not on file  Social History Narrative   Right handed    Caffeine 1 cup per day    Lives at home with daughter      No Known Allergies   Outpatient Medications Prior to Visit  Medication Sig Dispense Refill  . albuterol (PROVENTIL) (2.5 MG/3ML) 0.083% nebulizer solution Take 3 mLs (2.5 mg total) by nebulization every 4 (four) hours as needed for wheezing or shortness of breath. 120 mL 5  . albuterol (VENTOLIN HFA) 108 (90 Base) MCG/ACT  inhaler Inhale 1-2 puffs into the lungs every 6 (six) hours as needed for wheezing or shortness of breath. 1 Inhaler 5  . aspirin EC 81 MG tablet Take 81 mg by mouth daily.    Marland Kitchen. atorvastatin (LIPITOR) 10 MG tablet TAKE 1 TABLET(10 MG) BY MOUTH DAILY AT 6  PM 90 tablet 0  . benazepril (LOTENSIN) 10 MG tablet Take 10 mg by mouth 2 (two) times daily.     . budesonide-formoterol (SYMBICORT) 160-4.5 MCG/ACT inhaler INHALE 2 PUFFS INTO THE LUNGS TWICE DAILY 3 Inhaler 2  . carvedilol (COREG) 6.25 MG tablet Take 1 tablet (6.25 mg total) by mouth 2 (two) times daily with a meal. 180 tablet 3  . digoxin (LANOXIN) 0.125 MG tablet Take 0.5 tablets (0.0625 mg total) by mouth daily. Please make overdue appt with Dr. [name] before anymore refills. 3rd and Final Attempt 7 tablet 0  . ferrous sulfate 325 (65 FE) MG tablet Take 325 mg by mouth daily with breakfast.    . fluticasone (FLONASE) 50 MCG/ACT nasal spray Place 1 spray into both nostrils daily as needed for allergies.  3  . furosemide (LASIX) 20 MG tablet Take 1 tablet (20 mg total) by mouth daily. Please make annual appt with Dr. [name] for future refills. Thank you. 1st attempt. 90 tablet 0  . guaiFENesin (MUCINEX) 600 MG 12 hr tablet Take by mouth 2 (two) times daily. With fluids     . INCRUSE ELLIPTA 62.5 MCG/INH AEPB INHALE 1 PUFF INTO THE LUNGS DAILY (Patient not taking: Reported on 06/15/2018) 30 each 11  . insulin aspart (NOVOLOG) 100 UNIT/ML injection Inject into the skin daily as needed for high blood sugar (For CBG >300). As needed    . memantine (NAMENDA) 10 MG tablet Take 1 tablet (10 mg total) by mouth 2 (two) times daily. 180 tablet 3  . memantine (NAMENDA) 5 MG tablet Take 1 tablet daily for one week, then take 1 tablet twice daily for one week, then take 1 tablet in the morning and 2 in the evening for one week, then take 2 tablets twice daily 70 tablet 0  . metFORMIN (GLUCOPHAGE-XR) 500 MG 24 hr tablet Take 1,000 mg by mouth 2 (two) times  daily.   5  . Multiple Vitamin (MV-ONE PO) Take 1 tablet by mouth daily.    . predniSONE (DELTASONE) 10 MG tablet 4 tabs for 2 days, then 3 tabs for 2 days, 2 tabs for 2 days, then 1 tab for 2 days, then stop 20 tablet 0  . promethazine-codeine (PHENERGAN WITH CODEINE) 6.25-10 MG/5ML syrup Take 5 mLs by mouth 2 (two) times daily.    Marland Kitchen Spacer/Aero-Holding Chambers (AEROCHAMBER MV) inhaler Use as instructed 1 each 0  . Tiotropium Bromide Monohydrate (SPIRIVA RESPIMAT) 2.5 MCG/ACT AERS Inhale 2 puffs into the lungs daily. 1 Inhaler 5  . UNABLE TO FIND Med Name: Med pass 120 mL by mouth 3 times daily    . amoxicillin-clavulanate (AUGMENTIN) 875-125 MG tablet Take 1 tablet by mouth 2 (two) times daily. (Patient not taking: Reported on 11/01/2018) 20 tablet 0   No facility-administered medications prior to visit.     Review of Systems  Constitutional: Negative for chills, fever, malaise/fatigue and weight loss.  HENT: Negative for hearing loss, sore throat and tinnitus.   Eyes: Negative for blurred vision and double vision.  Respiratory: Positive for cough, shortness of breath and wheezing. Negative for hemoptysis, sputum production and stridor.   Cardiovascular: Negative for chest pain, palpitations, orthopnea, leg swelling and PND.  Gastrointestinal: Negative for abdominal pain, constipation, diarrhea, heartburn, nausea and vomiting.  Genitourinary: Negative for dysuria, hematuria and urgency.  Musculoskeletal: Negative for joint pain and myalgias.  Skin: Negative for itching and rash.  Neurological: Negative for dizziness, tingling, weakness and headaches.  Endo/Heme/Allergies: Negative for  environmental allergies. Does not bruise/bleed easily.  Psychiatric/Behavioral: Negative for depression. The patient is not nervous/anxious and does not have insomnia.   All other systems reviewed and are negative.    Objective:  Physical Exam Vitals signs reviewed.  Constitutional:      General: She  is not in acute distress.    Appearance: She is well-developed.  HENT:     Head: Normocephalic and atraumatic.     Mouth/Throat:     Pharynx: No oropharyngeal exudate.  Eyes:     Conjunctiva/sclera: Conjunctivae normal.     Pupils: Pupils are equal, round, and reactive to light.  Neck:     Vascular: No JVD.     Trachea: No tracheal deviation.     Comments: Loss of supraclavicular fat Cardiovascular:     Rate and Rhythm: Normal rate and regular rhythm.     Heart sounds: S1 normal and S2 normal.     Comments: Distant heart tones Pulmonary:     Effort: No tachypnea or accessory muscle usage.     Breath sounds: No stridor. Decreased breath sounds (throughout all lung fields) present. No wheezing, rhonchi or rales.  Abdominal:     General: Bowel sounds are normal. There is no distension.     Palpations: Abdomen is soft.     Tenderness: There is no abdominal tenderness.  Musculoskeletal:        General: Deformity (muscle wasting ) present.  Skin:    General: Skin is warm and dry.     Capillary Refill: Capillary refill takes less than 2 seconds.     Findings: No rash.  Neurological:     Mental Status: She is alert and oriented to person, place, and time.  Psychiatric:        Behavior: Behavior normal.      Vitals:   11/01/18 1034  BP: 122/70  Pulse: 71  SpO2: 100%  Weight: 90 lb 3.2 oz (40.9 kg)  Height: 5\' 3"  (1.6 m)   100% on 3 LPM  BMI Readings from Last 3 Encounters:  11/01/18 15.98 kg/m  06/15/18 15.62 kg/m  06/04/18 15.73 kg/m   Wt Readings from Last 3 Encounters:  11/01/18 90 lb 3.2 oz (40.9 kg)  06/15/18 85 lb 6.4 oz (38.7 kg)  06/04/18 86 lb (39 kg)     CBC    Component Value Date/Time   WBC 8.8 09/14/2016 1237   RBC 3.47 (L) 09/14/2016 1237   HGB 10.7 (L) 09/14/2016 1237   HGB 12.6 05/24/2016 0924   HCT 32.7 (L) 09/14/2016 1237   HCT 37.9 05/24/2016 0924   PLT 267.0 09/14/2016 1237   PLT 241 05/24/2016 0924   MCV 94.1 09/14/2016 1237   MCV  95 05/24/2016 0924   MCH 29.7 07/30/2016 0408   MCHC 32.7 09/14/2016 1237   RDW 16.7 (H) 09/14/2016 1237   RDW 13.8 05/24/2016 0924   LYMPHSABS 2.7 09/14/2016 1237   MONOABS 1.1 (H) 09/14/2016 1237   EOSABS 0.1 09/14/2016 1237   BASOSABS 0.1 09/14/2016 1237   Lab Results  Component Value Date   CREATININE 0.60 09/14/2016   BUN 20 09/14/2016   NA 139 09/14/2016   K 4.0 09/14/2016   CL 94 (L) 09/14/2016   CO2 35 (H) 09/14/2016     Chest Imaging: Chest x-ray 04/24/2018: Left basilar infiltrate. The patient's images have been independently reviewed by me.    Pulmonary Functions Testing Results: No flowsheet data found.  Office Spiro: FEV1 0.7L, 44% predicted  FeNO: None   Pathology: None   Echocardiogram: None   Heart Catheterization:  07/2016: Study Conclusions - Left ventricle: Diffuse hypokinesis abnormal septal motion The   cavity size was mildly dilated. Wall thickness was normal.   Systolic function was mildly to moderately reduced. The estimated   ejection fraction was in the range of 40% to 45%. - Aortic valve: Valve area (VTI): 1.49 cm^2. Valve area (Vmax):   1.61 cm^2. Valve area (Vmean): 1.46 cm^2. - Aorta: calcification of the intervalvular fibrosa and aortic   sinus. - Mitral valve: There was mild regurgitation. - Atrial septum: A patent foramen ovale cannot be excluded.    Assessment & Plan:     ICD-10-CM   1. COPD, severe (HCC)  J44.9   2. Protein-calorie malnutrition, severe  E43   3. Underweight  R63.6   4. Cachexia (HCC)  R64   5. Centrilobular emphysema (HCC)  J43.2   6. Chronic respiratory failure with hypoxia (HCC)  J96.11   7. Chronic diastolic heart failure (HCC)  Z61.09     Discussion:  This is a 85 year old female with severe protein calorie malnutrition, significant cachexia, low body mass index, severe COPD and chronic systolic heart failure.  Current regimen includes Spiriva Respimat as well as Symbicort. We will give her  samples of these medications today to help with prescription cost. We also discussed continued food intake.  We did celebrate the fact that she has gained some weight. She can continue use of her albuterol for as needed shortness of breath and wheezing. She also can use her nebulizers as needed. Patient to follow-up with [place] in 6 months or as needed for shortness of breath or wheezing or change in respiratory symptoms.  Today in the office we had a advance care planning discussion regarding her goals of care.  Patient was given and patient's daughter, copies of a [place] MOST form as well as a durable DNR form.  To review.  At this time not ready to sign the paperwork but they want to discuss further with the patient's son before they make this decision.  I recommended they call our office to let us know so we can address this.  I believe it would be appropriate to not undergo cardiopulmonary resuscitation with advanced lung disease and cachexia of her current state.  18 minutes of this office visit was spent discussing goals of care and reviewing advanced care planning to include durable DNR forms as well as [employer] form.  Greater than 50% of this patient's 25-minute of visit was been face-to-face discussing the recommendations and treatment plan.  As well as reviewing inhaler use.   Current Outpatient Medications:  .  albuterol (PROVENTIL) (2.5 MG/3ML) 0.083% nebulizer solution, Take 3 mLs (2.5 mg total) by nebulization every 4 (four) hours as needed for wheezing or shortness of breath., Disp: 120 mL, Rfl: 5 .  albuterol (VENTOLIN HFA) 108 (90 Base) MCG/ACT inhaler, Inhale 1-2 puffs into the lungs every 6 (six) hours as needed for wheezing or shortness of breath., Disp: 1 Inhaler, Rfl: 5 .  aspirin EC 81 MG tablet, Take 81 mg by mouth daily., Disp: , Rfl:  .  atorvastatin (LIPITOR) 10 MG tablet, TAKE 1 TABLET(10 MG) BY MOUTH DAILY AT 6 PM, Disp: 90 tablet, Rfl: 0 .  benazepril  (LOTENSIN) 10 MG tablet, Take 10 mg by mouth 2 (two) times daily. , Disp: , Rfl:  .  budesonide-formoterol (SYMBICORT) 160-4.5 MCG/ACT inhaler, INHALE 2 PUFFS  INTO THE LUNGS TWICE DAILY, Disp: 3 Inhaler, Rfl: 2 .  carvedilol (COREG) 6.25 MG tablet, Take 1 tablet (6.25 mg total) by mouth 2 (two) times daily with a meal., Disp: 180 tablet, Rfl: 3 .  digoxin (LANOXIN) 0.125 MG tablet, Take 0.5 tablets (0.0625 mg total) by mouth daily. Please make overdue appt with Dr. Eden EmmsNishan before anymore refills. 3rd and Final Attempt, Disp: 7 tablet, Rfl: 0 .  ferrous sulfate 325 (65 FE) MG tablet, Take 325 mg by mouth daily with breakfast., Disp: , Rfl:  .  fluticasone (FLONASE) 50 MCG/ACT nasal spray, Place 1 spray into both nostrils daily as needed for allergies., Disp: , Rfl: 3 .  furosemide (LASIX) 20 MG tablet, Take 1 tablet (20 mg total) by mouth daily. Please make annual appt with Dr. Eden EmmsNishan for future refills. Thank you. 1st attempt., Disp: 90 tablet, Rfl: 0 .  guaiFENesin (MUCINEX) 600 MG 12 hr tablet, Take by mouth 2 (two) times daily. With fluids , Disp: , Rfl:  .  INCRUSE ELLIPTA 62.5 MCG/INH AEPB, INHALE 1 PUFF INTO THE LUNGS DAILY (Patient not taking: Reported on 06/15/2018), Disp: 30 each, Rfl: 11 .  insulin aspart (NOVOLOG) 100 UNIT/ML injection, Inject into the skin daily as needed for high blood sugar (For CBG >300). As needed, Disp: , Rfl:  .  memantine (NAMENDA) 10 MG tablet, Take 1 tablet (10 mg total) by mouth 2 (two) times daily., Disp: 180 tablet, Rfl: 3 .  memantine (NAMENDA) 5 MG tablet, Take 1 tablet daily for one week, then take 1 tablet twice daily for one week, then take 1 tablet in the morning and 2 in the evening for one week, then take 2 tablets twice daily, Disp: 70 tablet, Rfl: 0 .  metFORMIN (GLUCOPHAGE-XR) 500 MG 24 hr tablet, Take 1,000 mg by mouth 2 (two) times daily. , Disp: , Rfl: 5 .  Multiple Vitamin (MV-ONE PO), Take 1 tablet by mouth daily., Disp: , Rfl:  .  predniSONE  (DELTASONE) 10 MG tablet, 4 tabs for 2 days, then 3 tabs for 2 days, 2 tabs for 2 days, then 1 tab for 2 days, then stop, Disp: 20 tablet, Rfl: 0 .  promethazine-codeine (PHENERGAN WITH CODEINE) 6.25-10 MG/5ML syrup, Take 5 mLs by mouth 2 (two) times daily., Disp: , Rfl:  .  Spacer/Aero-Holding Chambers (AEROCHAMBER MV) inhaler, Use as instructed, Disp: 1 each, Rfl: 0 .  Tiotropium Bromide Monohydrate (SPIRIVA RESPIMAT) 2.5 MCG/ACT AERS, Inhale 2 puffs into the lungs daily., Disp: 1 Inhaler, Rfl: 5 .  UNABLE TO FIND, Med Name: Med pass 120 mL by mouth 3 times daily, Disp: , Rfl:    Josephine IgoBradley L , DO [facility] 11/01/2018 10:43 AM

## 2018-11-01 NOTE — Patient Instructions (Addendum)
Thank you for visiting Dr. Valeta Harms at [facility]. Today we recommend the following:  Continue your current inhaler regimen. We will get you samples today.   Please remember the 3 W's, Wash your hands, Wait 6 feet apart and Wear a mask to do your part in reducing the spread of COVID-19.   Return in about 6 months (around 05/04/2019), or if symptoms worsen or fail to improve, for Follow for severe COPD .

## 2018-11-14 ENCOUNTER — Other Ambulatory Visit: Payer: Self-pay

## 2018-11-14 MED ORDER — SPIRIVA RESPIMAT 2.5 MCG/ACT IN AERS: 2.0000 | INHALATION_SPRAY | Freq: Every day | RESPIRATORY_TRACT | 5 refills | Status: DC

## 2018-11-16 ENCOUNTER — Other Ambulatory Visit: Payer: Self-pay | Admitting: *Deleted

## 2018-11-16 ENCOUNTER — Other Ambulatory Visit: Payer: Self-pay | Admitting: Cardiovascular Disease

## 2018-11-16 MED ORDER — DIGOXIN 125 MCG PO TABS: 0.0625 mg | ORAL_TABLET | Freq: Every day | ORAL | 1 refills | Status: DC

## 2018-11-16 NOTE — Telephone Encounter (Signed)
Outpatient Medication Detail   Disp Refills Start End   digoxin (LANOXIN) 0.125 MG tablet 15 tablet 1 11/16/2018    Sig - Route: Take 0.5 tablets (0.0625 mg total) by mouth daily. Please keep appt with Dr. Johnsie Cancel for further refills. - Oral   Sent to pharmacy as: digoxin (LANOXIN) 0.125 MG tablet   E-Prescribing Status: Receipt confirmed by pharmacy (11/16/2018 12:31 PM EDT)   [facility] #[id number] - [place], [place]

## 2018-11-29 ENCOUNTER — Telehealth: Payer: Self-pay | Admitting: Neurology

## 2018-11-29 NOTE — Telephone Encounter (Signed)
I called patient regarding rescheduling her 8/10 appointment due to unavailable schedule time. NP schedule change. Requested patient call back to r/s.

## 2018-12-02 NOTE — Progress Notes (Signed)
Virtual Visit via Video Note  I connected with [name] on 12/03/18 at  1:15 PM EDT by a video enabled telemedicine application and verified that I am speaking with the correct person using two identifiers.  Location: Patient: At her home  Provider: In the office    I discussed the limitations of evaluation and management by telemedicine and the availability of in person appointments. The patient expressed understanding and agreed to proceed.  History of Present Illness: 12/03/2018 SS: Ms. Curtright is an 85 year old female with history of progressive memory disturbance.  She currently lives with her daughter.  Her last memory score was 20/30.  MRI of the brain has shown mild small vessel disease.  She was started on Namenda.  She is on continuous oxygen due to her COPD.  Her daughter feels that her memory has improved somewhat since last visit with the addition of Namenda.  She is tolerating the Namenda well without side effect.  She requires some assistance with her ADLs.  Her appetite is good and since last visit she has gained nearly 2 pounds.  She has not had any falls.  She has a walker, but does not use it all the time.  Her daughter manages her medications.  Her overall health has been good since last visit.  She presents today for follow-up via virtual visit.  06/04/2018 Dr. Anne Hahn: Ms. Mathewson is an 85 year old right-handed black female with a history of progressive memory disturbance going back at least 5 years.  The patient has lived with her daughter for over a decade, 5 years ago she started requiring assistance keeping up with medications and appointments, she was making mistakes in this regard.  She has also needed to have her daughter manage her finances for the same reason.  The patient has gotten particularly bad with her short-term memory over the last 8 months, she is repeating herself frequently.  She has lost the ability to control the bowels and the bladder, she wears  adult diapers.  She is on oxygen at home.  She sleeps fairly well at night but she does not eat well during the day.  She has struggled to maintain her weight.  She has had a history of a low vitamin B12 level, this was recently checked and the daughter reports that this was unremarkable.  The patient is had a thyroid study done recently and she has had MRI of the brain done about a year and a half ago which showed a mild level small vessel disease.  The patient does have some hearing loss, she has been seen by ENT previously.  The patient reports no family history of memory problems.  She comes to the office today for an evaluation.   Observations/Objective: Is alert, disoriented to time, trouble hearing, speech is clear, facial symmetry noted, no arm drift, gait is slow, mildly unsteady  Moca-Blind was 13/22  Assessment and Plan: 1.  Progressive memory disturbance  Overall, her memory appears stable, possibly with slight improvement with the addition of Namenda.  She will continue taking Namenda 10 mg twice a day.  Since last visit, she has gained 2 pounds, is up to 92 lbs. we have not started Aricept due to her difficulty maintaining weight, as well as urinary and fecal incontinence.  She will follow-up in 6 months or sooner if needed.  I advised that if her symptoms worsen or she develops any new symptoms she should let us know.  Follow Up Instructions:  6 months 06/11/2019 10:45 am    I discussed the assessment and treatment plan with the patient. The patient was provided an opportunity to ask questions and all were answered. The patient agreed with the plan and demonstrated an understanding of the instructions.   The patient was advised to call back or seek an in-person evaluation if the symptoms worsen or if the condition fails to improve as anticipated.  I provided 15 minutes of non-face-to-face time during this encounter.  Evangeline Dakin, DNP  [facility] [address], [place], [place] [address] [phone]

## 2018-12-03 ENCOUNTER — Other Ambulatory Visit: Payer: Self-pay | Admitting: Nurse Practitioner

## 2018-12-03 ENCOUNTER — Ambulatory Visit: Payer: Medicare Other | Admitting: Neurology

## 2018-12-03 ENCOUNTER — Telehealth (INDEPENDENT_AMBULATORY_CARE_PROVIDER_SITE_OTHER): Payer: Medicare Other | Admitting: Neurology

## 2018-12-03 ENCOUNTER — Other Ambulatory Visit: Payer: Self-pay | Admitting: Cardiovascular Disease

## 2018-12-03 ENCOUNTER — Encounter: Payer: Self-pay | Admitting: Neurology

## 2018-12-03 DIAGNOSIS — F0280 Dementia in other diseases classified elsewhere without behavioral disturbance: Secondary | ICD-10-CM

## 2018-12-03 DIAGNOSIS — G301 Alzheimer's disease with late onset: Secondary | ICD-10-CM | POA: Diagnosis not present

## 2018-12-03 NOTE — Progress Notes (Signed)
I have read the note, and I agree with the clinical assessment and plan.  Julieanna Geraci K [place]   

## 2018-12-12 ENCOUNTER — Other Ambulatory Visit: Payer: Self-pay

## 2018-12-12 MED ORDER — ALBUTEROL SULFATE HFA 108 (90 BASE) MCG/ACT IN AERS: 1.0000 | INHALATION_SPRAY | Freq: Four times a day (QID) | RESPIRATORY_TRACT | 3 refills | Status: DC | PRN

## 2018-12-25 ENCOUNTER — Other Ambulatory Visit: Payer: Self-pay | Admitting: Cardiovascular Disease

## 2018-12-26 ENCOUNTER — Other Ambulatory Visit: Payer: Self-pay | Admitting: Cardiovascular Disease

## 2019-01-08 NOTE — Progress Notes (Signed)
CARDIOLOGY OFFICE NOTE  Date:  01/08/2019    [name] Date of Birth: 15-Apr-1930 Medical Record #[id number]  PCP:  Darrow BussingKoirala, Dibas, MD  Cardiologist:  Eden EmmsNishan    No chief complaint on file.   History of Present Illness: [name] is a 85 y.o. female who presents today for a one year check.     She has a history of diastolic CHF, HTN, DM2, COPD with ongoing tobacco abuse and atrial fibrillation. She was taken off of Coumadin due to age and low body mass. Her last 2D echo was in 2012, demonstrating normal EF at 60-65% and Grade I diastolic dysfunction. Her last 2D echo was in 2012, demonstrating normal EF at 60-65% and Grade I diastolic dysfunction.   Continue to have weight loss and dementia. Declining health overall Some dizziness but BP systolic not Under 100 labs ok done by Dr Kriste BasqueNadel   Severe COPD on oxygen Had pneumonia December 2019 Has seen [facility]  Using Spiriva, Respimat and Symbicort  Will try to give her a flu shot today    Past Medical History:  Diagnosis Date  . Acute bronchitis   . Acute on chronic respiratory failure with hypoxia (HCC)   . Alzheimer disease (HCC) 06/04/2018  . Benign hypertensive renal disease   . CAD (coronary artery disease)   . Cardiomyopathy    non-ischemic 04/2008 EF 45% cardiac MRI no scar  . CHF (congestive heart failure) (HCC)   . Chronic sinusitis   . COPD (chronic obstructive pulmonary disease) (HCC)   . COPD exacerbation (HCC)   . Depression   . Diabetes mellitus without complication, without long-term current use of insulin (HCC)   . Dyslipidemia associated with type 2 diabetes mellitus (HCC)   . Dyspnea on exertion    chronic  . Heart disease   . Hypercholesterolemia   . Hypertension   . On home oxygen therapy    "3L; 24/7; for the last 2 1/2 weeks; none before that" (07/26/2016)  . Protein-calorie malnutrition, severe (HCC)   . Sinus headache    "chronic"  . Stroke [facility](HCC) 1997   "mild"  .  Type II diabetes mellitus (HCC)     Past Surgical History:  Procedure Laterality Date  . carotid angiogram     2006  . ESOPHAGOGASTRODUODENOSCOPY     with foreign body removal  . NASAL SINUS SURGERY  ~1974;~ 1976  . TONSILLECTOMY     "when I was a chld"     Medications: Current Outpatient Medications  Medication Sig Dispense Refill  . albuterol (PROVENTIL) (2.5 MG/3ML) 0.083% nebulizer solution Take 3 mLs (2.5 mg total) by nebulization every 4 (four) hours as needed for wheezing or shortness of breath. 120 mL 5  . albuterol (VENTOLIN HFA) 108 (90 Base) MCG/ACT inhaler Inhale 1-2 puffs into the lungs every 6 (six) hours as needed for wheezing or shortness of breath. 18 g 3  . aspirin EC 81 MG tablet Take 81 mg by mouth daily.    Marland Kitchen. atorvastatin (LIPITOR) 10 MG tablet TAKE 1 TABLET(10 MG) BY MOUTH DAILY AT 6 PM 30 tablet 1  . benazepril (LOTENSIN) 10 MG tablet Take 10 mg by mouth 2 (two) times daily.     . budesonide-formoterol (SYMBICORT) 160-4.5 MCG/ACT inhaler INHALE 2 PUFFS INTO THE LUNGS TWICE DAILY 3 Inhaler 2  . budesonide-formoterol (SYMBICORT) 160-4.5 MCG/ACT inhaler Inhale 2 puffs into the lungs every 12 (twelve) hours. 1 Inhaler 6  .  budesonide-formoterol (SYMBICORT) 160-4.5 MCG/ACT inhaler Inhale 2 puffs into the lungs every 12 (twelve) hours. 1 Inhaler 0  . carvedilol (COREG) 6.25 MG tablet Take 1 tablet (6.25 mg total) by mouth 2 (two) times daily with a meal. 180 tablet 3  . digoxin (LANOXIN) 0.125 MG tablet TAKE 1/2 TABLET BY MOUTH EVERY DAY. KEEP UPCOMING APPT IN SEP. FOR FUTURE REFILLS 15 tablet 0  . ferrous sulfate 325 (65 FE) MG tablet Take 325 mg by mouth daily with breakfast.    . fluticasone (FLONASE) 50 MCG/ACT nasal spray Place 1 spray into both nostrils daily as needed for allergies.  3  . furosemide (LASIX) 20 MG tablet Take 1 tablet (20 mg total) by mouth daily. Please make annual appt with Dr. [name] for future refills. Thank you. 1st attempt. 90 tablet 0  .  guaiFENesin (MUCINEX) 600 MG 12 hr tablet Take by mouth 2 (two) times daily. With fluids     . INCRUSE ELLIPTA 62.5 MCG/INH AEPB INHALE 1 PUFF INTO THE LUNGS DAILY (Patient not taking: Reported on 06/15/2018) 30 each 11  . insulin aspart (NOVOLOG) 100 UNIT/ML injection Inject into the skin daily as needed for high blood sugar (For CBG >300). As needed    . memantine (NAMENDA) 10 MG tablet Take 1 tablet (10 mg total) by mouth 2 (two) times daily. 180 tablet 3  . metFORMIN (GLUCOPHAGE-XR) 500 MG 24 hr tablet Take 1,000 mg by mouth 2 (two) times daily.   5  . Multiple Vitamin (MV-ONE PO) Take 1 tablet by mouth daily.    . predniSONE (DELTASONE) 10 MG tablet 4 tabs for 2 days, then 3 tabs for 2 days, 2 tabs for 2 days, then 1 tab for 2 days, then stop 20 tablet 0  . promethazine-codeine (PHENERGAN WITH CODEINE) 6.25-10 MG/5ML syrup Take 5 mLs by mouth 2 (two) times daily.    Marland Kitchen Spacer/Aero-Holding Chambers (AEROCHAMBER MV) inhaler Use as instructed 1 each 0  . Tiotropium Bromide Monohydrate (SPIRIVA RESPIMAT) 2.5 MCG/ACT AERS Inhale 2 puffs into the lungs daily. 1 Inhaler 5  . Tiotropium Bromide Monohydrate (SPIRIVA RESPIMAT) 2.5 MCG/ACT AERS Inhale 2 puffs into the lungs daily. 1 g 0  . Tiotropium Bromide Monohydrate (SPIRIVA RESPIMAT) 2.5 MCG/ACT AERS Inhale 2 puffs into the lungs daily. 4 g 5  . UNABLE TO FIND Med Name: Med pass 120 mL by mouth 3 times daily     No current facility-administered medications for this visit.     Allergies: No Known Allergies  Social History: The patient  reports that she quit smoking about 2 years ago. Her smoking use included cigarettes. She started smoking about 70 years ago. She has a 52.50 pack-year smoking history. She has never used smokeless tobacco. She reports that she does not drink alcohol or use drugs.   Family History: The patient's family history includes Alcohol abuse in her brother and sister; Coronary artery disease in an other family member; Heart  disease in her father.   Review of Systems: Please see the history of present illness.   Otherwise, the review of systems is positive for none.   All other systems are reviewed and negative.   Physical Exam: VS:  There were no vitals taken for this visit. Marland Kitchen  BMI There is no height or weight on file to calculate BMI.  Wt Readings from Last 3 Encounters:  11/01/18 90 lb 3.2 oz (40.9 kg)  06/15/18 85 lb 6.4 oz (38.7 kg)  06/04/18 86 lb (39  kg)   Affect appropriate Thin chronically ill black female  HEENT: normal Neck supple with no adenopathy JVP normal no bruits no thyromegaly Lungs diffuse rhonchi no wheezing and good diaphragmatic motion Heart:  S1/S2 no murmur, no rub, gallop or click PMI normal Abdomen: benighn, BS positve, no tenderness, no AAA no bruit.  No HSM or HJR Distal pulses intact with no bruits No edema Neuro non-focal Skin warm and dry No muscular weakness    LABORATORY DATA:  EKG:  EKG is ordered today. 09/25/17 SR rate 60 RBBB LAFB  01/21/19 SR rate 81 RBBB LAFB PVCls no change   Lab Results  Component Value Date   WBC 8.8 09/14/2016   HGB 10.7 (L) 09/14/2016   HCT 32.7 (L) 09/14/2016   PLT 267.0 09/14/2016   GLUCOSE 141 (H) 09/14/2016   CHOL 129 05/24/2016   TRIG 76 05/24/2016   HDL 65 05/24/2016   LDLCALC 49 05/24/2016   ALT 11 09/14/2016   AST 15 09/14/2016   NA 139 09/14/2016   K 4.0 09/14/2016   CL 94 (L) 09/14/2016   CREATININE 0.60 09/14/2016   BUN 20 09/14/2016   CO2 35 (H) 09/14/2016   TSH 0.87 09/14/2016   INR 1.01 11/08/2011   HGBA1C 6.9 (H) 07/31/2016    BNP (last 3 results) No results for input(s): BNP in the last 8760 hours.  ProBNP (last 3 results) No results for input(s): PROBNP in the last 8760 hours.   Other Studies Reviewed Today:   Assessment/Plan:  1. Chronic Diastolic HF: denies dyspnea. euvolemic on physical exam - she actually looks malnourished. BP better controlled       2. HTN: Better on Lotensin   Daughter will monitor at home.   3. PAF:  She is in sinus.  Anticoagulation not recommended due to unsteadiness and falls.   4. Tobacco use: counseled on cessation less than 10 minutes no plans to stop On oxygen at home Due to her COPD   5. Probable dementia:  Continue Namenda f/u neurology   6. HLD - on statin - continue labs with primary    7. Malnourished - boost supplements f/u primary     Current medicines are reviewed with the patient today.  The patient does not have concerns regarding medicines other than what has been noted above.  The following changes have been made:  See above.  Labs/ tests ordered today include:    No orders of the defined types were placed in this encounter.    Disposition:   FU with me in 6 months    Patient is agreeable to this plan and will call if any problems develop in the interim.   Jenkins Rouge, MD

## 2019-01-21 ENCOUNTER — Encounter: Payer: Self-pay | Admitting: Cardiovascular Disease

## 2019-01-21 ENCOUNTER — Ambulatory Visit (INDEPENDENT_AMBULATORY_CARE_PROVIDER_SITE_OTHER): Payer: Medicare Other | Admitting: Cardiovascular Disease

## 2019-01-21 ENCOUNTER — Other Ambulatory Visit: Payer: Self-pay | Admitting: Cardiovascular Disease

## 2019-01-21 ENCOUNTER — Other Ambulatory Visit: Payer: Self-pay

## 2019-01-21 VITALS — BP 102/52 | HR 81 | Ht 63.0 in | Wt 90.0 lb

## 2019-01-21 DIAGNOSIS — Z23 Encounter for immunization: Secondary | ICD-10-CM

## 2019-01-21 DIAGNOSIS — I5032 Chronic diastolic (congestive) heart failure: Secondary | ICD-10-CM

## 2019-01-21 NOTE — Patient Instructions (Signed)

## 2019-01-25 ENCOUNTER — Other Ambulatory Visit: Payer: Self-pay | Admitting: Cardiovascular Disease

## 2019-03-29 ENCOUNTER — Other Ambulatory Visit: Payer: Self-pay

## 2019-03-29 MED ORDER — ALBUTEROL SULFATE HFA 108 (90 BASE) MCG/ACT IN AERS: 2.0000 | INHALATION_SPRAY | Freq: Four times a day (QID) | RESPIRATORY_TRACT | 5 refills | Status: DC | PRN

## 2019-04-21 ENCOUNTER — Telehealth: Payer: Self-pay | Admitting: Neurology

## 2019-04-24 ENCOUNTER — Other Ambulatory Visit: Payer: Self-pay | Admitting: Neurology

## 2019-04-24 NOTE — Telephone Encounter (Signed)
Patients daughter Donnald Garre called in and stated the last refill that was picked up was on 01/21/2019 , their hasnt been any refills since and patient currently has enough pills to last until Sunday.

## 2019-04-24 NOTE — Telephone Encounter (Signed)
I called pts daughter and refill was sent for appt to pharmacy.

## 2019-05-09 ENCOUNTER — Emergency Department (HOSPITAL_COMMUNITY): Payer: Medicare Other

## 2019-05-09 ENCOUNTER — Inpatient Hospital Stay (HOSPITAL_COMMUNITY): Payer: Medicare Other

## 2019-05-09 ENCOUNTER — Encounter (HOSPITAL_COMMUNITY): Payer: Self-pay | Admitting: *Deleted

## 2019-05-09 ENCOUNTER — Other Ambulatory Visit: Payer: Self-pay

## 2019-05-09 ENCOUNTER — Inpatient Hospital Stay (HOSPITAL_COMMUNITY)
Admission: EM | Admit: 2019-05-09 | Discharge: 2019-05-15 | DRG: 291 | Disposition: A | Discharge status: Home / Self Care | Payer: Medicare Other | Attending: Family Medicine | Admitting: Family Medicine

## 2019-05-09 DIAGNOSIS — Z794 Long term (current) use of insulin: Secondary | ICD-10-CM | POA: Diagnosis not present

## 2019-05-09 DIAGNOSIS — J439 Emphysema, unspecified: Secondary | ICD-10-CM | POA: Diagnosis present

## 2019-05-09 DIAGNOSIS — R000 Tachycardia, unspecified: Secondary | ICD-10-CM | POA: Diagnosis not present

## 2019-05-09 DIAGNOSIS — I480 Paroxysmal atrial fibrillation: Secondary | ICD-10-CM | POA: Diagnosis present

## 2019-05-09 DIAGNOSIS — Z20822 Contact with and (suspected) exposure to covid-19: Secondary | ICD-10-CM | POA: Diagnosis present

## 2019-05-09 DIAGNOSIS — J449 Chronic obstructive pulmonary disease, unspecified: Secondary | ICD-10-CM | POA: Diagnosis not present

## 2019-05-09 DIAGNOSIS — I2510 Atherosclerotic heart disease of native coronary artery without angina pectoris: Secondary | ICD-10-CM | POA: Diagnosis present

## 2019-05-09 DIAGNOSIS — E1169 Type 2 diabetes mellitus with other specified complication: Secondary | ICD-10-CM | POA: Diagnosis present

## 2019-05-09 DIAGNOSIS — R7401 Elevation of levels of liver transaminase levels: Secondary | ICD-10-CM | POA: Diagnosis not present

## 2019-05-09 DIAGNOSIS — Z66 Do not resuscitate: Secondary | ICD-10-CM

## 2019-05-09 DIAGNOSIS — E7800 Pure hypercholesterolemia, unspecified: Secondary | ICD-10-CM | POA: Diagnosis present

## 2019-05-09 DIAGNOSIS — R54 Age-related physical debility: Secondary | ICD-10-CM | POA: Diagnosis present

## 2019-05-09 DIAGNOSIS — Z515 Encounter for palliative care: Secondary | ICD-10-CM

## 2019-05-09 DIAGNOSIS — I5033 Acute on chronic diastolic (congestive) heart failure: Secondary | ICD-10-CM | POA: Diagnosis not present

## 2019-05-09 DIAGNOSIS — Z7982 Long term (current) use of aspirin: Secondary | ICD-10-CM

## 2019-05-09 DIAGNOSIS — E119 Type 2 diabetes mellitus without complications: Secondary | ICD-10-CM

## 2019-05-09 DIAGNOSIS — Z8249 Family history of ischemic heart disease and other diseases of the circulatory system: Secondary | ICD-10-CM

## 2019-05-09 DIAGNOSIS — Z87891 Personal history of nicotine dependence: Secondary | ICD-10-CM

## 2019-05-09 DIAGNOSIS — Z7181 Spiritual or religious counseling: Secondary | ICD-10-CM

## 2019-05-09 DIAGNOSIS — D539 Nutritional anemia, unspecified: Secondary | ICD-10-CM | POA: Diagnosis not present

## 2019-05-09 DIAGNOSIS — Z681 Body mass index (BMI) 19 or less, adult: Secondary | ICD-10-CM

## 2019-05-09 DIAGNOSIS — Z9981 Dependence on supplemental oxygen: Secondary | ICD-10-CM

## 2019-05-09 DIAGNOSIS — I5043 Acute on chronic combined systolic (congestive) and diastolic (congestive) heart failure: Secondary | ICD-10-CM | POA: Diagnosis present

## 2019-05-09 DIAGNOSIS — Z79899 Other long term (current) drug therapy: Secondary | ICD-10-CM

## 2019-05-09 DIAGNOSIS — R778 Other specified abnormalities of plasma proteins: Secondary | ICD-10-CM | POA: Diagnosis present

## 2019-05-09 DIAGNOSIS — Z811 Family history of alcohol abuse and dependence: Secondary | ICD-10-CM

## 2019-05-09 DIAGNOSIS — I160 Hypertensive urgency: Secondary | ICD-10-CM | POA: Diagnosis present

## 2019-05-09 DIAGNOSIS — N179 Acute kidney failure, unspecified: Secondary | ICD-10-CM | POA: Diagnosis not present

## 2019-05-09 DIAGNOSIS — J441 Chronic obstructive pulmonary disease with (acute) exacerbation: Secondary | ICD-10-CM | POA: Diagnosis present

## 2019-05-09 DIAGNOSIS — E089 Diabetes mellitus due to underlying condition without complications: Secondary | ICD-10-CM | POA: Diagnosis not present

## 2019-05-09 DIAGNOSIS — I083 Combined rheumatic disorders of mitral, aortic and tricuspid valves: Secondary | ICD-10-CM | POA: Diagnosis present

## 2019-05-09 DIAGNOSIS — G309 Alzheimer's disease, unspecified: Secondary | ICD-10-CM | POA: Diagnosis present

## 2019-05-09 DIAGNOSIS — J9611 Chronic respiratory failure with hypoxia: Secondary | ICD-10-CM | POA: Diagnosis present

## 2019-05-09 DIAGNOSIS — I471 Supraventricular tachycardia: Secondary | ICD-10-CM | POA: Diagnosis not present

## 2019-05-09 DIAGNOSIS — I4510 Unspecified right bundle-branch block: Secondary | ICD-10-CM | POA: Diagnosis present

## 2019-05-09 DIAGNOSIS — E43 Unspecified severe protein-calorie malnutrition: Secondary | ICD-10-CM | POA: Diagnosis present

## 2019-05-09 DIAGNOSIS — F0280 Dementia in other diseases classified elsewhere without behavioral disturbance: Secondary | ICD-10-CM | POA: Diagnosis present

## 2019-05-09 DIAGNOSIS — I361 Nonrheumatic tricuspid (valve) insufficiency: Secondary | ICD-10-CM

## 2019-05-09 DIAGNOSIS — I428 Other cardiomyopathies: Secondary | ICD-10-CM | POA: Diagnosis present

## 2019-05-09 DIAGNOSIS — E1165 Type 2 diabetes mellitus with hyperglycemia: Secondary | ICD-10-CM | POA: Diagnosis present

## 2019-05-09 DIAGNOSIS — I340 Nonrheumatic mitral (valve) insufficiency: Secondary | ICD-10-CM

## 2019-05-09 DIAGNOSIS — R7989 Other specified abnormal findings of blood chemistry: Secondary | ICD-10-CM | POA: Diagnosis present

## 2019-05-09 DIAGNOSIS — I5189 Other ill-defined heart diseases: Secondary | ICD-10-CM | POA: Diagnosis not present

## 2019-05-09 DIAGNOSIS — I4891 Unspecified atrial fibrillation: Secondary | ICD-10-CM | POA: Diagnosis not present

## 2019-05-09 DIAGNOSIS — E785 Hyperlipidemia, unspecified: Secondary | ICD-10-CM | POA: Diagnosis present

## 2019-05-09 DIAGNOSIS — I493 Ventricular premature depolarization: Secondary | ICD-10-CM | POA: Diagnosis not present

## 2019-05-09 DIAGNOSIS — J189 Pneumonia, unspecified organism: Secondary | ICD-10-CM | POA: Diagnosis not present

## 2019-05-09 DIAGNOSIS — Z209 Contact with and (suspected) exposure to unspecified communicable disease: Secondary | ICD-10-CM | POA: Diagnosis not present

## 2019-05-09 DIAGNOSIS — R0689 Other abnormalities of breathing: Secondary | ICD-10-CM | POA: Diagnosis not present

## 2019-05-09 DIAGNOSIS — R0602 Shortness of breath: Secondary | ICD-10-CM | POA: Diagnosis not present

## 2019-05-09 DIAGNOSIS — I110 Hypertensive heart disease with heart failure: Secondary | ICD-10-CM | POA: Diagnosis present

## 2019-05-09 DIAGNOSIS — M6281 Muscle weakness (generalized): Secondary | ICD-10-CM

## 2019-05-09 DIAGNOSIS — J9621 Acute and chronic respiratory failure with hypoxia: Secondary | ICD-10-CM | POA: Diagnosis present

## 2019-05-09 DIAGNOSIS — R339 Retention of urine, unspecified: Secondary | ICD-10-CM | POA: Diagnosis not present

## 2019-05-09 DIAGNOSIS — G301 Alzheimer's disease with late onset: Secondary | ICD-10-CM | POA: Diagnosis not present

## 2019-05-09 DIAGNOSIS — R410 Disorientation, unspecified: Secondary | ICD-10-CM

## 2019-05-09 LAB — HEPATIC FUNCTION PANEL
ALT: 52 U/L — ABNORMAL HIGH (ref 0–44)
AST: 52 U/L — ABNORMAL HIGH (ref 15–41)
Albumin: 4 g/dL (ref 3.5–5.0)
Alkaline Phosphatase: 45 U/L (ref 38–126)
Bilirubin, Direct: 0.2 mg/dL (ref 0.0–0.2)
Indirect Bilirubin: 0.8 mg/dL (ref 0.3–0.9)
Total Bilirubin: 1 mg/dL (ref 0.3–1.2)
Total Protein: 7.3 g/dL (ref 6.5–8.1)

## 2019-05-09 LAB — BASIC METABOLIC PANEL
Anion gap: 16 — ABNORMAL HIGH (ref 5–15)
BUN: 16 mg/dL (ref 8–23)
CO2: 23 mmol/L (ref 22–32)
Calcium: 9 mg/dL (ref 8.9–10.3)
Chloride: 100 mmol/L (ref 98–111)
Creatinine, Ser: 0.9 mg/dL (ref 0.44–1.00)
GFR calc Af Amer: >60 mL/min (ref >60)
GFR calc non Af Amer: 57 mL/min — ABNORMAL LOW (ref >60)
Glucose, Bld: 341 mg/dL — ABNORMAL HIGH (ref 70–99)
Potassium: 4.3 mmol/L (ref 3.5–5.1)
Sodium: 139 mmol/L (ref 135–145)

## 2019-05-09 LAB — TROPONIN I (HIGH SENSITIVITY)
Troponin I (High Sensitivity): 36 ng/L — ABNORMAL HIGH (ref <18)
Troponin I (High Sensitivity): 28 ng/L — ABNORMAL HIGH (ref <18)

## 2019-05-09 LAB — GLUCOSE, CAPILLARY
Glucose-Capillary: 266 mg/dL — ABNORMAL HIGH (ref 70–99)
Glucose-Capillary: 303 mg/dL — ABNORMAL HIGH (ref 70–99)

## 2019-05-09 LAB — CBC
HCT: 37.9 % (ref 36.0–46.0)
Hemoglobin: 11.6 g/dL — ABNORMAL LOW (ref 12.0–15.0)
MCH: 31.6 pg (ref 26.0–34.0)
MCHC: 30.6 g/dL (ref 30.0–36.0)
MCV: 103.3 fL — ABNORMAL HIGH (ref 80.0–100.0)
Platelets: 176 10*3/uL (ref 150–400)
RBC: 3.67 MIL/uL — ABNORMAL LOW (ref 3.87–5.11)
RDW: 14.3 % (ref 11.5–15.5)
WBC: 14.7 10*3/uL — ABNORMAL HIGH (ref 4.0–10.5)
nRBC: 0 % (ref 0.0–0.2)

## 2019-05-09 LAB — CBG MONITORING, ED
Glucose-Capillary: 229 mg/dL — ABNORMAL HIGH (ref 70–99)
Glucose-Capillary: 51 mg/dL — ABNORMAL LOW (ref 70–99)
Glucose-Capillary: 226 mg/dL — ABNORMAL HIGH (ref 70–99)

## 2019-05-09 LAB — BRAIN NATRIURETIC PEPTIDE: B Natriuretic Peptide: 1267.3 pg/mL — ABNORMAL HIGH (ref 0.0–100.0)

## 2019-05-09 LAB — INFLUENZA PANEL BY PCR (TYPE A & B)
Influenza A By PCR: NEGATIVE
Influenza B By PCR: NEGATIVE

## 2019-05-09 LAB — TSH: TSH: 1.429 u[IU]/mL (ref 0.350–4.500)

## 2019-05-09 LAB — HEMOGLOBIN A1C
Hgb A1c MFr Bld: 6 % — ABNORMAL HIGH (ref 4.8–5.6)
Mean Plasma Glucose: 125.5 mg/dL

## 2019-05-09 LAB — DIGOXIN LEVEL: Digoxin Level: <0.2 ng/mL — ABNORMAL LOW (ref 0.8–2.0)

## 2019-05-09 LAB — POC SARS CORONAVIRUS 2 AG -  ED: SARS Coronavirus 2 Ag: NEGATIVE

## 2019-05-09 LAB — SARS CORONAVIRUS 2 (TAT 6-24 HRS): SARS Coronavirus 2: NEGATIVE

## 2019-05-09 MED ORDER — ENOXAPARIN SODIUM 40 MG/0.4ML ~~LOC~~ SOLN
40.0000 mg | Freq: Every day | SUBCUTANEOUS | Status: DC
  Administered 2019-05-09 – 2019-05-15 (×7): 40 mg via SUBCUTANEOUS
  Filled 2019-05-09 (×7): qty 0.4

## 2019-05-09 MED ORDER — MAGNESIUM SULFATE 2 GM/50ML IV SOLN
2.0000 g | Freq: Once | INTRAVENOUS | Status: AC
  Administered 2019-05-09: 08:00:00 2 g via INTRAVENOUS
  Filled 2019-05-09: qty 50

## 2019-05-09 MED ORDER — INSULIN ASPART 100 UNIT/ML ~~LOC~~ SOLN
0.0000 [IU] | Freq: Four times a day (QID) | SUBCUTANEOUS | Status: DC
  Administered 2019-05-09: 5 [IU] via SUBCUTANEOUS
  Administered 2019-05-10: 2 [IU] via SUBCUTANEOUS
  Administered 2019-05-10: 5 [IU] via SUBCUTANEOUS
  Administered 2019-05-10: 2 [IU] via SUBCUTANEOUS
  Administered 2019-05-11: 05:00:00 1 [IU] via SUBCUTANEOUS
  Administered 2019-05-11: 10:00:00 5 [IU] via SUBCUTANEOUS
  Administered 2019-05-11: 22:00:00 3 [IU] via SUBCUTANEOUS
  Administered 2019-05-11: 16:00:00 9 [IU] via SUBCUTANEOUS
  Administered 2019-05-12: 07:00:00 2 [IU] via SUBCUTANEOUS
  Administered 2019-05-12: 22:00:00 5 [IU] via SUBCUTANEOUS
  Administered 2019-05-12: 17:00:00 9 [IU] via SUBCUTANEOUS
  Administered 2019-05-13: 3 [IU] via SUBCUTANEOUS

## 2019-05-09 MED ORDER — SODIUM CHLORIDE 0.9% FLUSH
3.0000 mL | INTRAVENOUS | Status: DC | PRN
  Administered 2019-05-10: 3 mL via INTRAVENOUS

## 2019-05-09 MED ORDER — DILTIAZEM HCL 25 MG/5ML IV SOLN
10.0000 mg | Freq: Once | INTRAVENOUS | Status: AC
  Administered 2019-05-09: 15:00:00 10 mg via INTRAVENOUS
  Filled 2019-05-09: qty 5

## 2019-05-09 MED ORDER — UMECLIDINIUM BROMIDE 62.5 MCG/INH IN AEPB
2.0000 | INHALATION_SPRAY | Freq: Every day | RESPIRATORY_TRACT | Status: DC
  Administered 2019-05-10: 2 via RESPIRATORY_TRACT
  Filled 2019-05-09: qty 7

## 2019-05-09 MED ORDER — ATORVASTATIN CALCIUM 10 MG PO TABS
10.0000 mg | ORAL_TABLET | Freq: Every day | ORAL | Status: DC
  Administered 2019-05-09 – 2019-05-14 (×6): 10 mg via ORAL
  Filled 2019-05-09 (×6): qty 1

## 2019-05-09 MED ORDER — DILTIAZEM HCL 100 MG IV SOLR
5.0000 mg/h | INTRAVENOUS | Status: DC
  Administered 2019-05-09: 22:00:00 5 mg/h via INTRAVENOUS
  Filled 2019-05-09: qty 100

## 2019-05-09 MED ORDER — ALBUTEROL SULFATE (2.5 MG/3ML) 0.083% IN NEBU
2.5000 mg | INHALATION_SOLUTION | Freq: Four times a day (QID) | RESPIRATORY_TRACT | Status: DC
  Administered 2019-05-09 – 2019-05-10 (×4): 2.5 mg via RESPIRATORY_TRACT
  Filled 2019-05-09 (×3): qty 3

## 2019-05-09 MED ORDER — DIGOXIN 125 MCG PO TABS
0.0625 mg | ORAL_TABLET | Freq: Every day | ORAL | Status: DC
  Administered 2019-05-09 – 2019-05-15 (×7): 0.0625 mg via ORAL
  Filled 2019-05-09 (×7): qty 1

## 2019-05-09 MED ORDER — METHYLPREDNISOLONE SODIUM SUCC 125 MG IJ SOLR
60.0000 mg | Freq: Three times a day (TID) | INTRAMUSCULAR | Status: DC
  Administered 2019-05-09 – 2019-05-10 (×3): 60 mg via INTRAVENOUS
  Filled 2019-05-09 (×3): qty 2

## 2019-05-09 MED ORDER — MEMANTINE HCL 10 MG PO TABS
10.0000 mg | ORAL_TABLET | Freq: Two times a day (BID) | ORAL | Status: DC
  Administered 2019-05-09 – 2019-05-15 (×13): 10 mg via ORAL
  Filled 2019-05-09 (×14): qty 1

## 2019-05-09 MED ORDER — FUROSEMIDE 10 MG/ML IJ SOLN: 40.0000 mg | Freq: Two times a day (BID) | INTRAMUSCULAR | Status: DC

## 2019-05-09 MED ORDER — FUROSEMIDE 10 MG/ML IJ SOLN
40.0000 mg | Freq: Two times a day (BID) | INTRAMUSCULAR | Status: DC
  Administered 2019-05-09 – 2019-05-10 (×2): 40 mg via INTRAVENOUS
  Filled 2019-05-09 (×2): qty 4

## 2019-05-09 MED ORDER — ACETAMINOPHEN 325 MG PO TABS: 650.0000 mg | ORAL_TABLET | Freq: Four times a day (QID) | ORAL | Status: DC | PRN

## 2019-05-09 MED ORDER — ASPIRIN EC 81 MG PO TBEC
81.0000 mg | DELAYED_RELEASE_TABLET | Freq: Every day | ORAL | Status: DC
  Administered 2019-05-09 – 2019-05-15 (×7): 81 mg via ORAL
  Filled 2019-05-09 (×7): qty 1

## 2019-05-09 MED ORDER — FUROSEMIDE 10 MG/ML IJ SOLN
40.0000 mg | Freq: Once | INTRAMUSCULAR | Status: AC
  Administered 2019-05-09: 09:00:00 40 mg via INTRAVENOUS
  Filled 2019-05-09: qty 4

## 2019-05-09 MED ORDER — IPRATROPIUM-ALBUTEROL 0.5-2.5 (3) MG/3ML IN SOLN: 3.0000 mL | Freq: Four times a day (QID) | RESPIRATORY_TRACT | Status: DC

## 2019-05-09 MED ORDER — SODIUM CHLORIDE 0.9 % IV SOLN: 250.0000 mL | INTRAVENOUS | Status: DC | PRN

## 2019-05-09 MED ORDER — GUAIFENESIN ER 600 MG PO TB12
600.0000 mg | ORAL_TABLET | Freq: Two times a day (BID) | ORAL | Status: DC
  Administered 2019-05-09 – 2019-05-15 (×13): 600 mg via ORAL
  Filled 2019-05-09 (×13): qty 1

## 2019-05-09 MED ORDER — ACETAMINOPHEN 650 MG RE SUPP: 650.0000 mg | Freq: Four times a day (QID) | RECTAL | Status: DC | PRN

## 2019-05-09 MED ORDER — CARVEDILOL 6.25 MG PO TABS
6.2500 mg | ORAL_TABLET | Freq: Two times a day (BID) | ORAL | Status: DC
  Administered 2019-05-09 – 2019-05-12 (×7): 6.25 mg via ORAL
  Filled 2019-05-09 (×7): qty 1

## 2019-05-09 MED ORDER — SODIUM CHLORIDE 0.9% FLUSH
3.0000 mL | Freq: Two times a day (BID) | INTRAVENOUS | Status: DC
  Administered 2019-05-11 – 2019-05-15 (×4): 3 mL via INTRAVENOUS

## 2019-05-09 MED ORDER — IPRATROPIUM BROMIDE 0.02 % IN SOLN: 0.5000 mg | Freq: Once | RESPIRATORY_TRACT | Status: DC

## 2019-05-09 MED ORDER — SODIUM CHLORIDE 0.9 % IV SOLN
1.0000 g | INTRAVENOUS | Status: DC
  Administered 2019-05-09 – 2019-05-10 (×2): 1 g via INTRAVENOUS
  Filled 2019-05-09 (×2): qty 10

## 2019-05-09 MED ORDER — BENAZEPRIL HCL 5 MG PO TABS
10.0000 mg | ORAL_TABLET | Freq: Two times a day (BID) | ORAL | Status: DC
  Administered 2019-05-09 – 2019-05-10 (×2): 10 mg via ORAL
  Filled 2019-05-09: qty 2
  Filled 2019-05-09 (×2): qty 1
  Filled 2019-05-09 (×2): qty 2

## 2019-05-09 MED ORDER — DILTIAZEM HCL 100 MG IV SOLR
5.0000 mg/h | INTRAVENOUS | Status: DC
  Filled 2019-05-09: qty 100

## 2019-05-09 MED ORDER — ARFORMOTEROL TARTRATE 15 MCG/2ML IN NEBU
15.0000 ug | INHALATION_SOLUTION | Freq: Two times a day (BID) | RESPIRATORY_TRACT | Status: DC
  Administered 2019-05-09 – 2019-05-15 (×13): 15 ug via RESPIRATORY_TRACT
  Filled 2019-05-09 (×14): qty 2

## 2019-05-09 MED ORDER — ALBUTEROL (5 MG/ML) CONTINUOUS INHALATION SOLN: 10.0000 mg/h | INHALATION_SOLUTION | Freq: Once | RESPIRATORY_TRACT | Status: DC

## 2019-05-09 MED ORDER — INSULIN ASPART 100 UNIT/ML ~~LOC~~ SOLN: 0.0000 [IU] | SUBCUTANEOUS | Status: DC

## 2019-05-09 MED ORDER — FLUTICASONE PROPIONATE 50 MCG/ACT NA SUSP
1.0000 | Freq: Every day | NASAL | Status: DC | PRN
  Filled 2019-05-09: qty 16

## 2019-05-09 MED ORDER — ALBUTEROL SULFATE (2.5 MG/3ML) 0.083% IN NEBU: 2.5000 mg | INHALATION_SOLUTION | RESPIRATORY_TRACT | Status: DC | PRN

## 2019-05-09 MED ORDER — ONDANSETRON HCL 4 MG/2ML IJ SOLN
4.0000 mg | Freq: Four times a day (QID) | INTRAMUSCULAR | Status: DC | PRN
  Administered 2019-05-09 – 2019-05-10 (×2): 4 mg via INTRAVENOUS
  Filled 2019-05-09 (×2): qty 2

## 2019-05-09 MED ORDER — BUDESONIDE 0.5 MG/2ML IN SUSP
0.5000 mg | Freq: Two times a day (BID) | RESPIRATORY_TRACT | Status: DC
  Administered 2019-05-09 – 2019-05-15 (×12): 0.5 mg via RESPIRATORY_TRACT
  Filled 2019-05-09 (×13): qty 2

## 2019-05-09 MED ORDER — SODIUM CHLORIDE 0.9% FLUSH
3.0000 mL | Freq: Two times a day (BID) | INTRAVENOUS | Status: DC
  Administered 2019-05-09 – 2019-05-14 (×7): 3 mL via INTRAVENOUS

## 2019-05-09 NOTE — ED Triage Notes (Signed)
Pt arrives via [facility] from home. Asleep, woke up with SOB, 15L NRB applied by FD, saturations 90%. Pt is on chronic 2L O2 for COPD. Minor LLL wheezing, breathing treatment x 2 given. Solumedrol given en route. Diminished otherwise. Afib on the EKG, 180/110, hr 120, afebrile. Iv established in the left hand.

## 2019-05-09 NOTE — ED Notes (Signed)
Lunch Tray Ordered @ 1822.  

## 2019-05-09 NOTE — ED Notes (Signed)
[name] daughter [phone], wants an update

## 2019-05-09 NOTE — ED Notes (Signed)
Pt CBG 51, pt given 8oz orange juice per hypoglycemic standing orders. Will recheck CBG in 

## 2019-05-09 NOTE — ED Provider Notes (Signed)
[facility] EMERGENCY DEPARTMENT Provider Note   CSN: [id number] Arrival date & time: 05/09/19  0602     History Chief Complaint  Patient presents with  . Shortness of Breath    [name] is a 86 y.o. female with history of diastolic CHF EF 02-58% and grade 1 diastolic dysfunction, hypertension, diabetes, COPD on chronic oxygen, atrial fibrillation not on anticoagulation, malnutrition presents to the ER for evaluation of shortness of breath.  This began last night around 10 PM.  At that time she also had flutters in her chest that have been constant since.  This morning she was doing laundry and felt like her breathing got worse.  She has breathing treatments at home that she uses as needed, states she used some earlier today and they helped slightly.  She is on chronic oxygen but cannot tell me how much.  She was wearing her oxygen when she developed symptoms.  Had been feeling okay over the last few days.  She denies any fevers, chills, sore throat, cough, chest pain or pressure, vomiting, diarrhea, abdominal pain, dysuria, urinary frequency.  States she has been taking all her medicines but cannot tell me the name.  Specifically, states she has been taking her water pill.  Patient lives at home.  Her daughter Donnald Garre helps take care of her.  States last time she smoked was a few months ago.   HPI     Past Medical History:  Diagnosis Date  . Acute bronchitis   . Acute on chronic respiratory failure with hypoxia ([place])   . Alzheimer disease ([place]) 06/04/2018  . Benign hypertensive renal disease   . CAD (coronary artery disease)   . Cardiomyopathy    non-ischemic 04/2008 EF 45% cardiac MRI no scar  . CHF (congestive heart failure) ([place])   . Chronic sinusitis   . COPD (chronic obstructive pulmonary disease) ([place])   . COPD exacerbation ([place])   . Depression   . Diabetes mellitus without complication, without long-term current use of insulin ([place])   . Dyslipidemia  associated with type 2 diabetes mellitus ([place])   . Dyspnea on exertion    chronic  . Heart disease   . Hypercholesterolemia   . Hypertension   . On home oxygen therapy    "3L; 24/7; for the last 2 1/2 weeks; none before that" (07/26/2016)  . Protein-calorie malnutrition, severe ([place])   . Sinus headache    "chronic"  . Stroke [facility]) 1997   "mild"  . Type II diabetes mellitus [facility])     Patient Active Problem List   Diagnosis Date Noted  . Atrial fibrillation with RVR ([place]) 05/09/2019  . 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 Transaminitis 05/09/2019  . Alzheimer disease ([place]) 06/04/2018  . COPD with acute exacerbation ([place]) 10/05/2017  . COPD mixed type ([place]) 08/15/2016  . Weakness 08/15/2016  . Protein-calorie malnutrition, severe 07/28/2016  . Pressure injury of skin 07/27/2016  . Chronic obstructive pulmonary disease ([place]) 07/26/2016  . Acute on chronic respiratory failure with hypoxia ([place]) 07/26/2016  . Essential hypertension 07/26/2016  . Dyslipidemia associated with type 2 diabetes mellitus ([place]) 07/26/2016  . Diabetes mellitus without complication, without long-term current use of insulin ([place]) 07/26/2016  . Acute bronchitis 07/26/2016  . Underweight 05/27/2015  . Chronic diastolic heart failure ([place]) 11/24/2011    Past Surgical History:  Procedure Laterality Date  . carotid angiogram     2006  . ESOPHAGOGASTRODUODENOSCOPY  with foreign body removal  . NASAL SINUS SURGERY  ~1974;~ 1976  . TONSILLECTOMY     "when I was a chld"     OB History   No obstetric history on file.     Family History  Problem Relation Age of Onset  . Heart disease Father   . Alcohol abuse Sister   . Alcohol abuse Brother   . Coronary artery disease Other        family history    Social History   Tobacco Use  . Smoking status: Former Smoker    Packs/day: 0.75    Years: 70.00    Pack years: 52.50    Types: Cigarettes    Start date: 12/13/1948     Quit date: 07/26/2016    Years since quitting: 2.7  . Smokeless tobacco: Never Used  Substance Use Topics  . Alcohol use: No    Alcohol/week: 0.0 standard drinks  . Drug use: No    Home Medications Prior to Admission medications   Medication Sig Start Date End Date Taking? Authorizing Provider  albuterol (PROVENTIL) (2.5 MG/3ML) 0.083% nebulizer solution Take 3 mLs (2.5 mg total) by nebulization every 4 (four) hours as needed for wheezing or shortness of breath. 05/04/18  Yes Icard, Bradley L, DO  albuterol (VENTOLIN HFA) 108 (90 Base) MCG/ACT inhaler Inhale 2 puffs into the lungs every 6 (six) hours as needed for wheezing or shortness of breath. 03/29/19 05/09/19 Yes Icard, Rachel Bo, DO  aspirin EC 81 MG tablet Take 81 mg by mouth daily.   Yes [provider]  atorvastatin (LIPITOR) 10 MG tablet TAKE 1 TABLET(10 MG) BY MOUTH DAILY AT 6 PM Patient taking differently: Take 10 mg by mouth daily at 6 PM.  01/25/19  Yes Wendall Stade, MD  benazepril (LOTENSIN) 10 MG tablet Take 10 mg by mouth 2 (two) times daily.    Yes [provider]  budesonide-formoterol (SYMBICORT) 160-4.5 MCG/ACT inhaler INHALE 2 PUFFS INTO THE LUNGS TWICE DAILY 09/06/18  Yes Icard, Bradley L, DO  carvedilol (COREG) 6.25 MG tablet Take 1 tablet (6.25 mg total) by mouth 2 (two) times daily with a meal. 05/24/16  Yes Rosalio Macadamia, NP  digoxin (LANOXIN) 0.125 MG tablet TAKE 1/2 TABLET BY MOUTH EVERY DAY. PLEASE KEEP APPT IN SEPT FOR FUTURE REFILLS Patient taking differently: Take 0.0625 mg by mouth daily.  01/23/19  Yes Wendall Stade, MD  feeding supplement (BOOST HIGH PROTEIN) LIQD Take 1 Container by mouth as needed (between meal).   Yes [provider]  ferrous sulfate 325 (65 FE) MG tablet Take 325 mg by mouth daily with breakfast.   Yes [provider]  fluticasone (FLONASE) 50 MCG/ACT nasal spray Place 1 spray into both nostrils daily as needed for allergies. 06/09/16  Yes [provider]  furosemide (LASIX) 20 MG tablet TAKE 1 TABLET BY MOUTH ONCE DAILY Patient taking differently: Take 20 mg by mouth daily.  01/25/19  Yes Wendall Stade, MD  insulin aspart (NOVOLOG) 100 UNIT/ML injection Inject into the skin daily as needed for high blood sugar (For CBG >300). As needed   Yes [provider]  Lactobacillus (PROBIOTIC ACIDOPHILUS PO) Take 1 capsule by mouth daily.   Yes [provider]  memantine (NAMENDA) 10 MG tablet TAKE 1 TABLET(10 MG) BY MOUTH TWICE DAILY Patient taking differently: Take 10 mg by mouth 2 (two) times daily.  04/24/19  Yes York Spaniel, MD  metFORMIN (GLUCOPHAGE-XR) 500  MG 24 hr tablet Take 1,000 mg by mouth 2 (two) times daily.  03/13/15  Yes [provider]  Multiple Vitamin (MV-ONE PO) Take 1 tablet by mouth daily.   Yes [provider]  Spacer/Aero-Holding Chambers (AEROCHAMBER MV) inhaler Use as instructed 02/21/17  Yes Michele Mcalpine, MD  Tiotropium Bromide Monohydrate (SPIRIVA RESPIMAT) 2.5 MCG/ACT AERS Inhale 2 puffs into the lungs daily. 05/24/18  Yes Icard, Rachel Bo, DO  vitamin B-12 (CYANOCOBALAMIN) 500 MCG tablet Take 1,000 mcg by mouth daily.    Yes [provider]  INCRUSE ELLIPTA 62.5 MCG/INH AEPB INHALE 1 PUFF INTO THE LUNGS DAILY Patient not taking: Reported on 05/09/2019 10/05/17   Michele Mcalpine, MD    Allergies    Patient has no known allergies.  Review of Systems   Review of Systems  Respiratory: Positive for shortness of breath.   Cardiovascular: Positive for palpitations.  All other systems reviewed and are negative.   Physical Exam Updated Vital Signs BP 139/76   Pulse 94   Temp (!) 96.2 F (35.7 C) (Rectal)   Resp 20   Ht 5' (1.524 m)   Wt 46.7 kg   SpO2 97%   BMI 20.12 kg/m   Physical Exam Vitals and nursing note reviewed.  Constitutional:      General: She is not in acute distress.    Appearance: She is well-developed.     Comments: Frail appearing,  awake.  Nontoxic.  HENT:     Head: Normocephalic and atraumatic.     Right Ear: External ear normal.     Left Ear: External ear normal.     Nose: Nose normal.     Mouth/Throat:     Comments: Moist mucous membranes Eyes:     General: No scleral icterus.    Conjunctiva/sclera: Conjunctivae normal.  Cardiovascular:     Rate and Rhythm: Tachycardia present. Rhythm irregular.     Heart sounds: Normal heart sounds.     Comments: Irregularly irregular.  Heart rate fluctuating 118-125.  No lower extremity edema.  1+ DP and radial pulses bilaterally.  No calf tenderness. Pulmonary:     Effort: Pulmonary effort is normal.     Breath sounds: Wheezing present.     Comments: Tachypnea.  Speaking in full sentences.  No severe respiratory distress.  Inspiratory and expiratory wheezing in all lung fields. Musculoskeletal:        General: No deformity. Normal range of motion.     Cervical back: Normal range of motion and neck supple.  Skin:    General: Skin is warm and dry.     Capillary Refill: Capillary refill takes less than 2 seconds.  Neurological:     Mental Status: She is alert and oriented to person, place, and time.  Psychiatric:        Behavior: Behavior normal.        Thought Content: Thought content normal.        Judgment: Judgment normal.     ED Results / Procedures / Treatments   Labs (all labs ordered are listed, but only abnormal results are displayed) Labs Reviewed  BASIC METABOLIC PANEL - Abnormal; Notable for the following components:      Result Value   Glucose, Bld 341 (*)    GFR calc non Af Amer 57 (*)    Anion gap 16 (*)    All other components within normal limits  CBC - Abnormal; Notable for the following components:   WBC 14.7 (*)  RBC 3.67 (*)    Hemoglobin 11.6 (*)    MCV 103.3 (*)    All other components within normal limits  BRAIN NATRIURETIC PEPTIDE - Abnormal; Notable for the following components:   B Natriuretic Peptide 1,267.3 (*)    All other  components within normal limits  HEPATIC FUNCTION PANEL - Abnormal; Notable for the following components:   AST 52 (*)    ALT 52 (*)    All other components within normal limits  DIGOXIN LEVEL - Abnormal; Notable for the following components:   Digoxin Level <0.2 (*)    All other components within normal limits  HEMOGLOBIN A1C - Abnormal; Notable for the following components:   Hgb A1c MFr Bld 6.0 (*)    All other components within normal limits  CBG MONITORING, ED - Abnormal; Notable for the following components:   Glucose-Capillary 51 (*)    All other components within normal limits  CBG MONITORING, ED - Abnormal; Notable for the following components:   Glucose-Capillary 229 (*)    All other components within normal limits  TROPONIN I (HIGH SENSITIVITY) - Abnormal; Notable for the following components:   Troponin I (High Sensitivity) 36 (*)    All other components within normal limits  TROPONIN I (HIGH SENSITIVITY) - Abnormal; Notable for the following components:   Troponin I (High Sensitivity) 28 (*)    All other components within normal limits  SARS CORONAVIRUS 2 (TAT 6-24 HRS)  INFLUENZA PANEL BY PCR (TYPE A & B)  URINALYSIS, ROUTINE W REFLEX MICROSCOPIC  TSH  POC SARS CORONAVIRUS 2 AG -  ED    EKG EKG Interpretation  Date/Time:  Thursday May 09 2019 07:07:03 EST Ventricular Rate:  161 PR Interval:    QRS Duration: 149 QT Interval:  312 QTC Calculation: 511 R Axis:   -102 Text Interpretation: Extreme tachycardia with wide complex, no further rhythm analysis attempted Atrial fibrillation with rapid ventricular response No significant change since last tracing Confirmed by Gwyneth Sprout ([id number]) on 05/09/2019 8:37:02 AM   Radiology DG Chest Portable 1 View  Result Date: 05/09/2019 CLINICAL DATA:  Shortness of breath EXAM: PORTABLE CHEST 1 VIEW COMPARISON:  June 15, 2018 FINDINGS: There is mild cardiomegaly. Aortic knob calcifications. Slight hyperinflation  of the upper lung zones. Both lungs are clear. The visualized skeletal structures are unremarkable. IMPRESSION: No active disease. Electronically Signed   By: Jonna Clark M.D.   On: 05/09/2019 06:34    Procedures .Critical Care Performed by: Liberty Handy, PA-C Authorized by: Liberty Handy, PA-C   Critical care provider statement:    Critical care time (minutes):  45   Critical care was necessary to treat or prevent imminent or life-threatening deterioration of the following conditions:  Circulatory failure   Critical care was time spent personally by me on the following activities:  Discussions with consultants, evaluation of patient's response to treatment, examination of patient, ordering and performing treatments and interventions, ordering and review of laboratory studies, ordering and review of radiographic studies, pulse oximetry, re-evaluation of patient's condition, obtaining history from patient or surrogate, review of old charts and development of treatment plan with patient or surrogate   I assumed direction of critical care for this patient from another provider in my specialty: no     (including critical care time)  Medications Ordered in ED Medications  enoxaparin (LOVENOX) injection 40 mg (40 mg Subcutaneous Given 05/09/19 1505)  sodium chloride flush (NS) 0.9 % injection 3 mL (3 mLs  Intravenous Not Given 05/09/19 1134)  acetaminophen (TYLENOL) tablet 650 mg (has no administration in time range)    Or  acetaminophen (TYLENOL) suppository 650 mg (has no administration in time range)  guaiFENesin (MUCINEX) 12 hr tablet 600 mg (600 mg Oral Given 05/09/19 1451)  aspirin EC tablet 81 mg (81 mg Oral Given 05/09/19 1453)  atorvastatin (LIPITOR) tablet 10 mg (has no administration in time range)  carvedilol (COREG) tablet 6.25 mg (has no administration in time range)  digoxin (LANOXIN) tablet 0.0625 mg (0.0625 mg Oral Given 05/09/19 1452)  benazepril (LOTENSIN) tablet 10 mg  (has no administration in time range)  memantine (NAMENDA) tablet 10 mg (10 mg Oral Given 05/09/19 1451)  budesonide (PULMICORT) nebulizer solution 0.5 mg (0.5 mg Nebulization Not Given 05/09/19 1416)  arformoterol (BROVANA) nebulizer solution 15 mcg (15 mcg Nebulization Given 05/09/19 1419)  umeclidinium bromide (INCRUSE ELLIPTA) 62.5 MCG/INH 2 puff (2 puffs Inhalation Not Given 05/09/19 1416)  fluticasone (FLONASE) 50 MCG/ACT nasal spray 1 spray (has no administration in time range)  sodium chloride flush (NS) 0.9 % injection 3 mL (3 mLs Intravenous Not Given 05/09/19 1412)  sodium chloride flush (NS) 0.9 % injection 3 mL (has no administration in time range)  0.9 %  sodium chloride infusion (has no administration in time range)  ondansetron (ZOFRAN) injection 4 mg (has no administration in time range)  insulin aspart (novoLOG) injection 0-9 Units (0 Units Subcutaneous Not Given 05/09/19 1447)  albuterol (PROVENTIL) (2.5 MG/3ML) 0.083% nebulizer solution 2.5 mg (2.5 mg Nebulization Given 05/09/19 1419)  methylPREDNISolone sodium succinate (SOLU-MEDROL) 125 mg/2 mL injection 60 mg (60 mg Intravenous Given 05/09/19 1458)  magnesium sulfate IVPB 2 g 50 mL (0 g Intravenous Stopped 05/09/19 0856)  furosemide (LASIX) injection 40 mg (40 mg Intravenous Given 05/09/19 0916)  diltiazem (CARDIZEM) injection 10 mg (10 mg Intravenous Given 05/09/19 1454)    ED Course  I have reviewed the triage vital signs and the nursing notes.  Pertinent labs & imaging results that were available during my care of the patient were reviewed by me and considered in my medical decision making (see chart for details).  Clinical Course as of May 08 1546  Thu May 09, 2019  0726 WBC(!): 14.7 [CG]  0726 Hemoglobin(!): 11.6 [CG]  0726 Glucose(!): 341 [CG]  0726 Anion gap(!): 16 [CG]  0825 Re-evaluated patient - HR 120s. No changes in respiratory status    [CG]  0840 Troponin I (High Sensitivity)(!): 36 [CG]  0840 B Natriuretic  Peptide(!): 1,267.3 [CG]  0841 SARS Coronavirus 2 Ag: NEGATIVE [CG]  0841 Influenza A By PCR: NEGATIVE [CG]  0841 Influenza B By PCR: NEGATIVE [CG]  0841 Extreme tachycardia with wide complex, no further rhythm analysis attempted Atrial fibrillation with rapid ventricular response No significant change since last tracing Confirmed by Gwyneth Sprout ([id number]) on 05/09/2019 8:37:02 AM  ED EKG [CG]  0945 Spoke to daughter [name] who is caregiver. States patient complained of shortness of breath and palpitations.  She administers patient's medicine and pt has been compliant with all medicines including lasix. Pt unaware but pt has not complained of fever, vomiting, diarrhea, abdominal pain.   [CG]    Clinical Course User Index [CG] Jerrell Mylar   MDM Rules/Calculators/A&P                      86 yo F presents with shortness of breath and palpitations since last night 10 pm.   EMR reviewed  to assist with MDM.  History of severe COPD on chronic oxygen, atrial fibrillation not on OAC. Last cigarette a few months ago.  No chest pains. No infectious symptoms.  Patient presents with atrial fibrillation with RVR HR 120-160s after 2 breathing treatments and solumedrol en route.  She is moderately dyspnic with diffuse wheezing.  Euvolemic.    Ddx includes COPD exacerbation vs symptomatic atrial fibrillation with RVR.  HR could be secondary to beta agonist as well or secondary to COPD exacerbation.  PE is on differential as well since she is not on OAC, but no CP.  Doubt ACS.  No infectious symptoms but will test for COVID, influenza.   IV mag, ipratropium ordered. Will reassess.     0945: Chest x-ray is nonacute, no infiltrate or edema.  EKG shows atrial fibrillation with RVR.  BNP is 1267.  Troponins are elevated I suspect this is from demand ischemia, patient has been reevaluated and he has no chest pain.  Mild leukocytosis noted however patient is afebrile, influenza, Covid swabs are  negative.  We will discuss with hospitalist for admission of CHF exacerbation, elevated troponins.  COPD could have been a contributor to this.  Discussed with EDP. Daughter [name] updated, please call with updates.  Final Clinical Impression(s) / ED Diagnoses Final diagnoses:  Acute on chronic diastolic congestive heart failure (HCC)  Elevated troponin    Rx / DC Orders ED Discharge Orders    None       [place] Handy, PA-C 05/09/19 1548    Gwyneth Sprout, MD 05/15/19 1109

## 2019-05-09 NOTE — ED Notes (Signed)
Lunch tray @ bedside. Pt states she is able to feed self. MD @ bedside assessing pt. Pt temp unable to read. This EMT requested rectal temp to be checked. Pt reports feeling nauseous. This EMT informed RN.

## 2019-05-09 NOTE — ED Notes (Signed)
Declined to have anyone contacted 

## 2019-05-09 NOTE — H&P (Addendum)
History and Physical    [name] UTM:[id number] DOB: 06-17-1929 DOA: 05/09/2019  Referring MD/NP/PA: Sharen Heck, PA-C PCP: Patient, No Pcp Per   Patient coming from: Home via EMS  Chief Complaint: Shortness of breath  I have personally briefly reviewed patient's old medical records in [facility]   HPI: [name] is a 86 y.o. female with medical history significant of hypertension, combined systolic and diastolic CHF,  atrial fibrillation not on anticoagulation, COPD/emphysema, oxygen dependent of 2 L oxygen, diabetes mellitus type 2 diet controlled, remote history of tobacco use, and dementia.  She presents with complaints of acute onset of shortness of breath.  Patient reported that she had been feeling short of breath since last night, but she did not say anything to her daughter.  However, she woke up her daughter at 4:30 AM this morning stating she was unable to breathe.  Patient lives at home with the daughter who manages her medications.  Her daughter had tried giving her a breathing treatment without any improvement in symptoms prior to calling EMS.  She is followed by [facility] pulmonology in the outpatient setting.  She had not been on steroids recently.  Denies having any fever, chills, chest pain, nausea, vomiting, diarrhea, falls, or dysuria symptoms.  In route with EMS patient had been found to have O2 saturation of 90% on home 2 L and was placed on a nonrebreather.  She was given 2 breathing treatments and 125 mg of Solu-Medrol prior to arrival.   ED Course: Patient was found to be afebrile in atrial fibrillation with heart rates elevated up to 160.  She was also tachypneic and O2 saturations noted to be maintained on 2 L nasal cannula oxygen.  Labs significant for WBC 14.7, hemoglobin 11.6, glucose 341, anion gap 16, AST 52, ALT 52, BNP 1267.3, troponin 36->21. influenza screening was negative.  Chest x-ray revealed mild cardiomegaly without any signs of edema.   Patient was given 40 mg of Lasix and 2 g of magnesium sulfate.  TRH called to admit.  Review of Systems  Constitutional: Negative for fever and malaise/fatigue.  HENT: Negative for ear discharge and tinnitus.   Eyes: Negative for photophobia and pain.  Respiratory: Positive for shortness of breath and wheezing.   Cardiovascular: Negative for chest pain and leg swelling.  Gastrointestinal: Negative for nausea and vomiting.  Genitourinary: Negative for dysuria and hematuria.  Musculoskeletal: Negative for falls and myalgias.  Skin: Negative for rash.  Neurological: Negative for focal weakness and loss of consciousness.  Psychiatric/Behavioral: Positive for memory loss. Negative for substance abuse.    Past Medical History:  Diagnosis Date  . Acute bronchitis   . Acute on chronic respiratory failure with hypoxia (HCC)   . Alzheimer disease (HCC) 06/04/2018  . Benign hypertensive renal disease   . CAD (coronary artery disease)   . Cardiomyopathy    non-ischemic 04/2008 EF 45% cardiac MRI no scar  . CHF (congestive heart failure) (HCC)   . Chronic sinusitis   . COPD (chronic obstructive pulmonary disease) (HCC)   . COPD exacerbation (HCC)   . Depression   . Diabetes mellitus without complication, without long-term current use of insulin (HCC)   . Dyslipidemia associated with type 2 diabetes mellitus (HCC)   . Dyspnea on exertion    chronic  . Heart disease   . Hypercholesterolemia   . Hypertension   . On home oxygen therapy    "3L; 24/7; for the last 2 1/2 weeks;  none before that" (07/26/2016)  . Protein-calorie malnutrition, severe ([place])   . Sinus headache    "chronic"  . Stroke [facility]) 1997   "mild"  . Type II diabetes mellitus ([place])     Past Surgical History:  Procedure Laterality Date  . carotid angiogram     2006  . ESOPHAGOGASTRODUODENOSCOPY     with foreign body removal  . NASAL SINUS SURGERY  ~1974;~ 1976  . TONSILLECTOMY     "when I was a chld"     reports that  she quit smoking about 2 years ago. Her smoking use included cigarettes. She started smoking about 70 years ago. She has a 52.50 pack-year smoking history. She has never used smokeless tobacco. She reports that she does not drink alcohol or use drugs.  No Known Allergies  Family History  Problem Relation Age of Onset  . Heart disease Father   . Alcohol abuse Sister   . Alcohol abuse Brother   . Coronary artery disease Other        family history    Prior to Admission medications   Medication Sig Start Date End Date Taking? Authorizing Provider  albuterol (PROVENTIL) (2.5 MG/3ML) 0.083% nebulizer solution Take 3 mLs (2.5 mg total) by nebulization every 4 (four) hours as needed for wheezing or shortness of breath. 05/04/18   Icard, Octavio Graves, DO  albuterol (VENTOLIN HFA) 108 (90 Base) MCG/ACT inhaler Inhale 2 puffs into the lungs every 6 (six) hours as needed for wheezing or shortness of breath. 03/29/19 04/28/19  Garner Nash, DO  aspirin EC 81 MG tablet Take 81 mg by mouth daily.    [provider]  atorvastatin (LIPITOR) 10 MG tablet TAKE 1 TABLET(10 MG) BY MOUTH DAILY AT 6 PM 01/25/19   Josue Hector, MD  benazepril (LOTENSIN) 10 MG tablet Take 10 mg by mouth 2 (two) times daily.     [provider]  budesonide-formoterol (SYMBICORT) 160-4.5 MCG/ACT inhaler INHALE 2 PUFFS INTO THE LUNGS TWICE DAILY 09/06/18   Icard, Leory Plowman L, DO  carvedilol (COREG) 6.25 MG tablet Take 1 tablet (6.25 mg total) by mouth 2 (two) times daily with a meal. 05/24/16   Burtis Junes, NP  digoxin (LANOXIN) 0.125 MG tablet TAKE 1/2 TABLET BY MOUTH EVERY DAY. PLEASE KEEP APPT IN SEPT FOR FUTURE REFILLS 01/23/19   Josue Hector, MD  ferrous sulfate 325 (65 FE) MG tablet Take 325 mg by mouth daily with breakfast.    [provider]  fluticasone (FLONASE) 50 MCG/ACT nasal spray Place 1 spray into both nostrils daily as needed for allergies. 06/09/16   [provider]  furosemide  (LASIX) 20 MG tablet TAKE 1 TABLET BY MOUTH ONCE DAILY 01/25/19   Josue Hector, MD  INCRUSE ELLIPTA 62.5 MCG/INH AEPB INHALE 1 PUFF INTO THE LUNGS DAILY 10/05/17   Noralee Space, MD  insulin aspart (NOVOLOG) 100 UNIT/ML injection Inject into the skin daily as needed for high blood sugar (For CBG >300). As needed    [provider]  memantine (NAMENDA) 10 MG tablet TAKE 1 TABLET(10 MG) BY MOUTH TWICE DAILY 04/24/19   Kathrynn Ducking, MD  metFORMIN (GLUCOPHAGE-XR) 500 MG 24 hr tablet Take 1,000 mg by mouth 2 (two) times daily.  03/13/15   [provider]  Multiple Vitamin (MV-ONE PO) Take 1 tablet by mouth daily.    [provider]  Spacer/Aero-Holding Chambers (AEROCHAMBER MV) inhaler Use as instructed 02/21/17   Teressa Lower  M, MD  Tiotropium Bromide Monohydrate (SPIRIVA RESPIMAT) 2.5 MCG/ACT AERS Inhale 2 puffs into the lungs daily. 05/24/18   Icard, Rachel Bo, DO  UNABLE TO FIND Med Name: Med pass 120 mL by mouth 3 times daily    [provider]    Physical Exam:  Constitutional: Thin frail elderly female who appears to be in some respiratory distress. Vitals:   05/09/19 0700 05/09/19 0745 05/09/19 0800 05/09/19 0900  BP: (!) 163/98   (!) 154/82  Pulse: (!) 123 (!) 131 (!) 160 (!) 118  Resp: (!) 21 19 (!) 24 19  Temp:      TempSrc:      SpO2: 99% 98% 99% 99%  Weight:      Height:       Eyes: PERRL, lids and conjunctivae normal ENMT: Mucous membranes are dry. Posterior pharynx clear of any exudate or lesions.   Neck: normal, supple, no masses, no thyromegaly.  No significant JVD appreciated. Respiratory:Tachypneic with decreased overall aeration with positive expiratory wheezes appreciated.  Patient currently on 2 L of nasal cannula oxygen with O2 saturations maintained. Cardiovascular: Irregular irregular, no murmurs / rubs / gallops. No extremity edema. 2+ pedal pulses. No carotid bruits.  Abdomen: no tenderness, no masses palpated. No  hepatosplenomegaly. Bowel sounds positive.  Musculoskeletal: no clubbing / cyanosis. No joint deformity upper and lower extremities. Good ROM, no contractures. Normal muscle tone.  Skin: no rashes, lesions, ulcers. No induration Neurologic: CN 2-12 grossly intact. Sensation intact, DTR normal. Strength 5/5 in all 4.  Psychiatric: Decreased recent memory. Alert and oriented x 3. Normal mood.     Labs on Admission: I have personally reviewed following labs and imaging studies  CBC: Recent Labs  Lab 05/09/19 0627  WBC 14.7*  HGB 11.6*  HCT 37.9  MCV 103.3*  PLT 176   Basic Metabolic Panel: Recent Labs  Lab 05/09/19 0627  NA 139  K 4.3  CL 100  CO2 23  GLUCOSE 341*  BUN 16  CREATININE 0.90  CALCIUM 9.0   GFR: Estimated Creatinine Clearance: 31.6 mL/min (by C-G formula based on SCr of 0.9 mg/dL). Liver Function Tests: Recent Labs  Lab 05/09/19 0627  AST 52*  ALT 52*  ALKPHOS 45  BILITOT 1.0  PROT 7.3  ALBUMIN 4.0   No results for input(s): LIPASE, AMYLASE in the last 168 hours. No results for input(s): AMMONIA in the last 168 hours. Coagulation Profile: No results for input(s): INR, PROTIME in the last 168 hours. Cardiac Enzymes: No results for input(s): CKTOTAL, CKMB, CKMBINDEX, TROPONINI in the last 168 hours. BNP (last 3 results) No results for input(s): PROBNP in the last 8760 hours. HbA1C: No results for input(s): HGBA1C in the last 72 hours. CBG: No results for input(s): GLUCAP in the last 168 hours. Lipid Profile: No results for input(s): CHOL, HDL, LDLCALC, TRIG, CHOLHDL, LDLDIRECT in the last 72 hours. Thyroid Function Tests: No results for input(s): TSH, T4TOTAL, FREET4, T3FREE, THYROIDAB in the last 72 hours. Anemia Panel: No results for input(s): VITAMINB12, FOLATE, FERRITIN, TIBC, IRON, RETICCTPCT in the last 72 hours. Urine analysis:    Component Value Date/Time   COLORURINE YELLOW 04/23/2016 1755   APPEARANCEUR HAZY (A) 04/23/2016 1755    LABSPEC 1.011 04/23/2016 1755   PHURINE 5.0 04/23/2016 1755   GLUCOSEU NEGATIVE 04/23/2016 1755   GLUCOSEU NEGATIVE 05/13/2010 1224   HGBUR NEGATIVE 04/23/2016 1755   BILIRUBINUR NEGATIVE 04/23/2016 1755   KETONESUR NEGATIVE 04/23/2016 1755   PROTEINUR NEGATIVE  04/23/2016 1755   UROBILINOGEN 0.2 01/13/2015 1638   NITRITE NEGATIVE 04/23/2016 1755   LEUKOCYTESUR SMALL (A) 04/23/2016 1755   Sepsis Labs: No results found for this or any previous visit (from the past 240 hour(s)).   Radiological Exams on Admission: DG Chest Portable 1 View  Result Date: 05/09/2019 CLINICAL DATA:  Shortness of breath EXAM: PORTABLE CHEST 1 VIEW COMPARISON:  June 15, 2018 FINDINGS: There is mild cardiomegaly. Aortic knob calcifications. Slight hyperinflation of the upper lung zones. Both lungs are clear. The visualized skeletal structures are unremarkable. IMPRESSION: No active disease. Electronically Signed   By: Jonna Clark M.D.   On: 05/09/2019 06:34    EKG: Independently reviewed.  Atrial fibrillation 161 bpm with RVR  Assessment/Plan Respiratory failure with hypoxia, COPD exacerbation: Acute on chronic. Patient chronically on nasal cannula oxygen 2 L.  On physical exam patient noted to have decreased overall aeration with positive expiratory wheeze.  Patient had been given 125 mg of Solu-Medrol IV and 2 breathing treatments prior to admission.. -Admit to a progressive bed -Continuous pulse oximetry with nasal cannula oxygen to maintain O2 saturation greater than 92% -Albuterol nebs 4 times daily -Brovana and budesonide Nebs twice daily -Continue Spiriva -Solu-Medrol 60 mg every 8 hours -Empiric antibiotics of Rocephin IV  Paroxysmal atrial fibrillation with RVR: Patient presented 160s in atrial fibrillation with RVR.  Patient normally on Coreg twice daily.  CHA2DS2-VASc score = at least 6. Patient takes 81 mg aspirin daily and is not on anticoagulation due unsteadiness and increased fall risk.   Patient followed in outpatient clinic by Dr. [name]. -Check TSH -Goal magnesium 2 and potassium 4 -Diltiazem 10 mg IV x1 dose, then started on a diltiazem drip per cardiology -Restart Coreg -Check echocardiogram -Dr. [name] of cardiology consulted, will follow-up for further recommendations  Leukocytosis: Acute.  Labs significant for WBC 14.7 on admission.  Patient otherwise noted to be afebrile.  No clear source of infection noted at this time and may be reactive in nature -Continue to monitor  Combined systolic and diastolic congestive heart failure: Patient appears to be euvolemic with no signs edema noted on physical exam or on chest x-ray.  BNP significantly elevated at 1267.3 is new.  Last EF noted to be 40 to 45% with diffuse hypokinesis and abnormal septal motion by  echocardiogram from 07/2016.  Patient had been given 40 mg of Lasix IV x1 dose.   -Strict intake and output -Daily weights -Follow-up echocardiogram -Lasix 40 mg IV BID -Reassess in a.m. and adjust diuresis as needed  Diabetes mellitus type 2: On admission initial blood glucose elevated up to 341 after patient had received IV steroids.  Patient not been on any insulin at home after her previous prescription had expired.  Blood sugars had been well maintained. -Hypoglycemic protocols -CBGs every 6 hours with sensitive SSI  Hypertensive urgency: Blood pressure elevated 188/106.  Blood pressure medications include Coreg -Restart Coreg  Macrocytic anemia: Hemoglobin 11.6 which appears near her baseline. -Continue to monitor  Transaminitis: Patient's AST 52 and ALT 52.  Unclear cause of this at this time. -Continue to monitor   Alzheimer's dementia: Patient's dementia is mild per family and they report that she often we will ask questions multiple times. -Continue Namenda  DVT prophylaxis: lovenox Code Status: Full  Family Communication: Discussed plan of care with patient's daughter over the phone Disposition  Plan: Possible discharge home in 2 to 3 days Consults called: Cardiology Admission status: Inpatient  Nachmen Mansel A Katrinka Blazing  MD [facility] Pager [phone]   If 7PM-7AM, please contact night-coverage [email or web address] Password [facility]  05/09/2019, 10:03 AM

## 2019-05-09 NOTE — Progress Notes (Signed)
  Echocardiogram 2D Echocardiogram has been performed.  Macdonald Rigor A Valrie Jia 05/09/2019, 2:04 PM

## 2019-05-09 NOTE — Consult Note (Signed)
Cardiology Consultation:   Patient ID: [name]; [id number]; [birth year]   Admit date: 05/09/2019 Date of Consult: 05/09/2019  Primary Care Provider: Patient, No Pcp Per Primary Cardiologist: Dr. [name], MD   Patient Profile:   [name] is a 86 y.o. female with a hx of systolic and diastolic CHF, HTN, DM2, COPD on home O2 (follows with pulmonary medicine) with ongoing tobacco abuse and atrial fibrillation not on anticoagulation who is being seen today for the evaluation of at the request of Dr. Katrinka Blazing.   History of Present Illness:   [name] is an 86 year old with a history stated above who presented to [facility] on 05/09/2019 with complaints of shortness of breath with associated palpitations which began yesterday evening approximately 10 PM. Patient states that this morning, prior to presentation, she was doing laundry and felt that her breathing became acutely worse. She reports using one of her home nurse with mild improvement however her symptoms returned. She is on chronic oxygen therapy for COPD and follows with pulmonary medicine. Prior to this event, she was in her usual state of health without any acute symptoms. She denies chest pain, cough, orthopnea, LE edema, dizziness, presyncopal or syncopal episodes.  In route with EMS, O2 saturation found to be 90% on home 2 L and patient was placed on nonrebreather and given 125 mg of Solu-Medrol prior to ED arrival.  In the ED, patient found to be in atrial fibrillation with RVR with heart rates in the 160 range. She was tachypneic however O2 saturations were stable.  WBC found to be elevated at 14.7.  AST and ALT elevated at 52, 52.  High-sensitivity troponin 36>> 21.  BNP was found to be elevated at 1267.  Subsequent CXR with mild cardiomegaly without pulmonary edema. She was given Atrovent, 2 g of mag sulfate and Lasix 40 mg IV.    She was admitted to internal medicine service for which an echocardiogram was ordered>> pending  results.   Past Medical History:  Diagnosis Date  . Acute bronchitis   . Acute on chronic respiratory failure with hypoxia (HCC)   . Alzheimer disease (HCC) 06/04/2018  . Benign hypertensive renal disease   . CAD (coronary artery disease)   . Cardiomyopathy    non-ischemic 04/2008 EF 45% cardiac MRI no scar  . CHF (congestive heart failure) (HCC)   . Chronic sinusitis   . COPD (chronic obstructive pulmonary disease) (HCC)   . COPD exacerbation (HCC)   . Depression   . Diabetes mellitus without complication, without long-term current use of insulin (HCC)   . Dyslipidemia associated with type 2 diabetes mellitus (HCC)   . Dyspnea on exertion    chronic  . Heart disease   . Hypercholesterolemia   . Hypertension   . On home oxygen therapy    "3L; 24/7; for the last 2 1/2 weeks; none before that" (07/26/2016)  . Protein-calorie malnutrition, severe (HCC)   . Sinus headache    "chronic"  . Stroke [facility]) 1997   "mild"  . Type II diabetes mellitus (HCC)     Past Surgical History:  Procedure Laterality Date  . carotid angiogram     2006  . ESOPHAGOGASTRODUODENOSCOPY     with foreign body removal  . NASAL SINUS SURGERY  ~1974;~ 1976  . TONSILLECTOMY     "when I was a chld"     Prior to Admission medications   Medication Sig Start Date End Date Taking? Authorizing Provider  albuterol (  PROVENTIL) (2.5 MG/3ML) 0.083% nebulizer solution Take 3 mLs (2.5 mg total) by nebulization every 4 (four) hours as needed for wheezing or shortness of breath. 05/04/18  Yes Icard, Bradley L, DO  albuterol (VENTOLIN HFA) 108 (90 Base) MCG/ACT inhaler Inhale 2 puffs into the lungs every 6 (six) hours as needed for wheezing or shortness of breath. 03/29/19 05/09/19 Yes Icard, Rachel Bo, DO  aspirin EC 81 MG tablet Take 81 mg by mouth daily.   Yes [provider]  atorvastatin (LIPITOR) 10 MG tablet TAKE 1 TABLET(10 MG) BY MOUTH DAILY AT 6 PM Patient taking differently: Take 10 mg by mouth daily at  6 PM.  01/25/19  Yes Wendall Stade, MD  benazepril (LOTENSIN) 10 MG tablet Take 10 mg by mouth 2 (two) times daily.    Yes [provider]  budesonide-formoterol (SYMBICORT) 160-4.5 MCG/ACT inhaler INHALE 2 PUFFS INTO THE LUNGS TWICE DAILY 09/06/18  Yes Icard, Bradley L, DO  carvedilol (COREG) 6.25 MG tablet Take 1 tablet (6.25 mg total) by mouth 2 (two) times daily with a meal. 05/24/16  Yes Rosalio Macadamia, NP  digoxin (LANOXIN) 0.125 MG tablet TAKE 1/2 TABLET BY MOUTH EVERY DAY. PLEASE KEEP APPT IN SEPT FOR FUTURE REFILLS Patient taking differently: Take 0.0625 mg by mouth daily.  01/23/19  Yes Wendall Stade, MD  feeding supplement (BOOST HIGH PROTEIN) LIQD Take 1 Container by mouth as needed (between meal).   Yes [provider]  ferrous sulfate 325 (65 FE) MG tablet Take 325 mg by mouth daily with breakfast.   Yes [provider]  fluticasone (FLONASE) 50 MCG/ACT nasal spray Place 1 spray into both nostrils daily as needed for allergies. 06/09/16  Yes [provider]  furosemide (LASIX) 20 MG tablet TAKE 1 TABLET BY MOUTH ONCE DAILY Patient taking differently: Take 20 mg by mouth daily.  01/25/19  Yes Wendall Stade, MD  insulin aspart (NOVOLOG) 100 UNIT/ML injection Inject into the skin daily as needed for high blood sugar (For CBG >300). As needed   Yes [provider]  Lactobacillus (PROBIOTIC ACIDOPHILUS PO) Take 1 capsule by mouth daily.   Yes [provider]  memantine (NAMENDA) 10 MG tablet TAKE 1 TABLET(10 MG) BY MOUTH TWICE DAILY Patient taking differently: Take 10 mg by mouth 2 (two) times daily.  04/24/19  Yes York Spaniel, MD  metFORMIN (GLUCOPHAGE-XR) 500 MG 24 hr tablet Take 1,000 mg by mouth 2 (two) times daily.  03/13/15  Yes [provider]  Multiple Vitamin (MV-ONE PO) Take 1 tablet by mouth daily.   Yes [provider]  Spacer/Aero-Holding Chambers (AEROCHAMBER MV) inhaler Use as instructed 02/21/17   Yes Michele Mcalpine, MD  Tiotropium Bromide Monohydrate (SPIRIVA RESPIMAT) 2.5 MCG/ACT AERS Inhale 2 puffs into the lungs daily. 05/24/18  Yes Icard, Rachel Bo, DO  vitamin B-12 (CYANOCOBALAMIN) 500 MCG tablet Take 1,000 mcg by mouth daily.    Yes [provider]  INCRUSE ELLIPTA 62.5 MCG/INH AEPB INHALE 1 PUFF INTO THE LUNGS DAILY Patient not taking: Reported on 05/09/2019 10/05/17   Michele Mcalpine, MD    Inpatient Medications: Scheduled Meds: . albuterol  2.5 mg Nebulization QID  . arformoterol  15 mcg Nebulization BID  . aspirin EC  81 mg Oral Daily  . atorvastatin  10 mg Oral q1800  . benazepril  10 mg Oral BID  . budesonide (PULMICORT) nebulizer solution  0.5 mg Nebulization BID  . carvedilol  6.25 mg  Oral BID WC  . digoxin  0.0625 mg Oral Daily  . diltiazem  10 mg Intravenous Once  . enoxaparin (LOVENOX) injection  40 mg Subcutaneous Daily  . guaiFENesin  600 mg Oral BID  . insulin aspart  0-9 Units Subcutaneous Q4H  . memantine  10 mg Oral BID  . methylPREDNISolone (SOLU-MEDROL) injection  60 mg Intravenous Q8H  . sodium chloride flush  3 mL Intravenous Q12H  . sodium chloride flush  3 mL Intravenous Q12H  . umeclidinium bromide  2 puff Inhalation Daily   Continuous Infusions: . sodium chloride     PRN Meds: sodium chloride, acetaminophen **OR** acetaminophen, fluticasone, ondansetron (ZOFRAN) IV, sodium chloride flush  Allergies:   No Known Allergies  Social History:   Social History   Socioeconomic History  . Marital status: Divorced    Spouse name: Not on file  . Number of children: 1  . Years of education: Not on file  . Highest education level: Not on file  Occupational History  . Occupation: retired    Associate Professor: RETIRED  Tobacco Use  . Smoking status: Former Smoker    Packs/day: 0.75    Years: 70.00    Pack years: 52.50    Types: Cigarettes    Start date: 12/13/1948    Quit date: 07/26/2016    Years since quitting: 2.7  . Smokeless tobacco: Never  Used  Substance and Sexual Activity  . Alcohol use: No    Alcohol/week: 0.0 standard drinks  . Drug use: No  . Sexual activity: Never  Other Topics Concern  . Not on file  Social History Narrative   Right handed    Caffeine 1 cup per day    Lives at home with daughter    Social Determinants of Health   Financial Resource Strain:   . Difficulty of Paying Living Expenses: Not on file  Food Insecurity:   . Worried About Programme researcher, broadcasting/film/video in the Last Year: Not on file  . Ran Out of Food in the Last Year: Not on file  Transportation Needs:   . Lack of Transportation (Medical): Not on file  . Lack of Transportation (Non-Medical): Not on file  Physical Activity:   . Days of Exercise per Week: Not on file  . Minutes of Exercise per Session: Not on file  Stress:   . Feeling of Stress : Not on file  Social Connections:   . Frequency of Communication with Friends and Family: Not on file  . Frequency of Social Gatherings with Friends and Family: Not on file  . Attends Religious Services: Not on file  . Active Member of Clubs or Organizations: Not on file  . Attends Banker Meetings: Not on file  . Marital Status: Not on file  Intimate Partner Violence:   . Fear of Current or Ex-Partner: Not on file  . Emotionally Abused: Not on file  . Physically Abused: Not on file  . Sexually Abused: Not on file    Family History:   Family History  Problem Relation Age of Onset  . Heart disease Father   . Alcohol abuse Sister   . Alcohol abuse Brother   . Coronary artery disease Other        family history   Family Status:  Family Status  Relation Name Status  . Father  Deceased  . Sister  Deceased  . Mother old age Deceased  . Brother  Deceased  . MGM  Deceased  .  MGF  Deceased  . PGM  Deceased  . PGF  Deceased  . Other  (Not Specified)    ROS:  Please see the history of present illness.  All other ROS reviewed and negative.     Physical Exam/Data:   Vitals:    05/09/19 1115 05/09/19 1140 05/09/19 1145 05/09/19 1255  BP: (!) 129/94 (!) 161/81 (!) 147/94 (!) 173/107  Pulse: (!) 122 (!) 125 (!) 124 (!) 120  Resp:    (!) 21  Temp:      TempSrc:      SpO2: 98% 98% 99% 99%  Weight:      Height:        Intake/Output Summary (Last 24 hours) at 05/09/2019 1336 Last data filed at 05/09/2019 0856 Gross per 24 hour  Intake 50 ml  Output --  Net 50 ml   Filed Weights   05/09/19 0610  Weight: 46.7 kg   Body mass index is 20.12 kg/m.   Affect appropriate Chronically ill malnourished female  HEENT: normal Neck supple with no adenopathy JVP elevated  no bruits no thyromegaly Lungs basilar rales and wheezing and good diaphragmatic motion Heart:  S1/S2 loud MR murmur, no rub, gallop or click PMI normal Abdomen: benighn, BS positve, no tenderness, no AAA no bruit.  No HSM or HJR Distal pulses intact with no bruits No edema Neuro non-focal Skin warm and dry No muscular weakness   EKG:  The EKG was personally reviewed and demonstrates: 05/09/19 AF with HR 122bpm with IVCD Telemetry:  Telemetry was personally reviewed and demonstrates:  afib rates 120 bpm  Relevant CV Studies:  Echocardiogram 07/29/2016:  Study Conclusions  - Left ventricle: Diffuse hypokinesis abnormal septal motion The   cavity size was mildly dilated. Wall thickness was normal.   Systolic function was mildly to moderately reduced. The estimated   ejection fraction was in the range of 40% to 45%. - Aortic valve: Valve area (VTI): 1.49 cm^2. Valve area (Vmax):   1.61 cm^2. Valve area (Vmean): 1.46 cm^2. - Aorta: calcification of the intervalvular fibrosa and aortic   sinus. - Mitral valve: There was mild regurgitation. - Atrial septum: A patent foramen ovale cannot be excluded.  Cardiac MRI 08/12/2011:  Findings:  There was mild LVE.  There was moderate LAE.  RA/RV were normal. There was no ASD/VSD or pericardial effusion.  Ascending aortic root was  normal  There was mild diffuse hypokinesis.  Quantitative EF was 42% ( EDV 126cc, ESV 74cc and SV 51cc)  First pass perfusion was normal. There was no hyperenhancement or scar tissue  Impression:     1)    Mild LVE with diffuse hypokinesis.  EF 42%        2)    Normal first pass perfusion 3)    No hyperenhancement or scar 4)    Moderate LAE 5)    Normal RV Cc Dr [name]  Laboratory Data:  Chemistry Recent Labs  Lab 05/09/19 0627  NA 139  K 4.3  CL 100  CO2 23  GLUCOSE 341*  BUN 16  CREATININE 0.90  CALCIUM 9.0  GFRNONAA 57*  GFRAA >60  ANIONGAP 16*    Total Protein  Date Value Ref Range Status  05/09/2019 7.3 6.5 - 8.1 g/dL Final  33/29/5188 7.1 6.0 - 8.5 g/dL Final   Albumin  Date Value Ref Range Status  05/09/2019 4.0 3.5 - 5.0 g/dL Final  41/66/0630 4.5 3.5 - 4.7 g/dL Final   AST  Date Value Ref Range Status  05/09/2019 52 (H) 15 - 41 U/L Final   ALT  Date Value Ref Range Status  05/09/2019 52 (H) 0 - 44 U/L Final   Alkaline Phosphatase  Date Value Ref Range Status  05/09/2019 45 38 - 126 U/L Final   Total Bilirubin  Date Value Ref Range Status  05/09/2019 1.0 0.3 - 1.2 mg/dL Final   Bilirubin Total  Date Value Ref Range Status  05/24/2016 0.5 0.0 - 1.2 mg/dL Final   Hematology Recent Labs  Lab 05/09/19 0627  WBC 14.7*  RBC 3.67*  HGB 11.6*  HCT 37.9  MCV 103.3*  MCH 31.6  MCHC 30.6  RDW 14.3  PLT 176   Cardiac EnzymesNo results for input(s): TROPONINI in the last 168 hours. No results for input(s): TROPIPOC in the last 168 hours.  BNP Recent Labs  Lab 05/09/19 0627  BNP 1,267.3*    DDimer No results for input(s): DDIMER in the last 168 hours. TSH:  Lab Results  Component Value Date   TSH 0.87 09/14/2016   Lipids: Lab Results  Component Value Date   CHOL 129 05/24/2016   HDL 65 05/24/2016   LDLCALC 49 05/24/2016   TRIG 76 05/24/2016   CHOLHDL 2.0 05/24/2016   HgbA1c: Lab Results  Component Value Date   HGBA1C  6.9 (H) 07/31/2016    Radiology/Studies:  DG Chest Portable 1 View  Result Date: 05/09/2019 CLINICAL DATA:  Shortness of breath EXAM: PORTABLE CHEST 1 VIEW COMPARISON:  June 15, 2018 FINDINGS: There is mild cardiomegaly. Aortic knob calcifications. Slight hyperinflation of the upper lung zones. Both lungs are clear. The visualized skeletal structures are unremarkable. IMPRESSION: No active disease. Electronically Signed   By: Prudencio Pair M.D.   On: 05/09/2019 06:34   Assessment and Plan:   1. Afib : rate elevated due to COPD exacerbation continue oral digoxin and coreg. Continue iv cardizem drip until breathing status improves Rate was > 160 in ER initially now in 115 to 120 range Dig level was low She has not been anticoagulated in past due to extreme low body weight and fall risk   2. CHF:  Bedside review of echo shows severe MR which is new since 2018 and similar EF 40% range Given age and weight not a candidate for mitral clip. Continue iv lasix coreg , digoxin and lotensin   3. COPD:  On home oxygen quit smoking 2 years ago significant wheezing on exam steroids and nebs per primary service WBC is elevated not on antibiotics  I spoke with her daughter Donnald Garre and she indicates her mother and her discussed being intubated but not being shocked or having her Heart restarted if she were to have arrhythmia or asystole Have noted partial code in chart     For questions or updates, please contact [place] Please consult [email or web address] for contact info under Cardiology/STEMI.   Jenkins Rouge

## 2019-05-09 NOTE — ED Notes (Signed)
RN attempted report 

## 2019-05-09 NOTE — ED Notes (Signed)
ED Provider at bedside. 

## 2019-05-09 NOTE — Progress Notes (Signed)
Inpatient Diabetes Program Recommendations  AACE/ADA: New Consensus Statement on Inpatient Glycemic Control (2015)  Target Ranges:  Prepandial:   less than 140 mg/dL      Peak postprandial:   less than 180 mg/dL (1-2 hours)      Critically ill patients:  140 - 180 mg/dL   Lab Results  Component Value Date   GLUCAP 226 (H) 05/09/2019   HGBA1C 6.0 (H) 05/09/2019    Review of Glycemic Control Results for [name], [name] (MRN [id number]) as of 05/09/2019 16:53  Ref. Range 05/09/2019 14:43 05/09/2019 15:02 05/09/2019 15:37  Glucose-Capillary Latest Ref Range: 70 - 99 mg/dL 51 (L) 175 (H) 102 (H)   Diabetes history: DM 2 Outpatient Diabetes medications: Metformin 1000 mg bid, Novolog prn Current orders for Inpatient glycemic control:  Novolog 0-9 units Q4 hours  Solumedrol 60 Q8 hours  Inpatient Diabetes Program Recommendations:    Renal function WNL. Consider increasing Novolog Correction to Novolog Moderate 0-15 units.  Thanks,  Christena Deem RN, MSN, BC-ADM Inpatient Diabetes Coordinator Team Pager [phone] (8a-5p)

## 2019-05-09 NOTE — ED Notes (Signed)
PureWick placed.

## 2019-05-09 NOTE — ED Notes (Signed)
Pt placed on purewick 

## 2019-05-10 DIAGNOSIS — J449 Chronic obstructive pulmonary disease, unspecified: Secondary | ICD-10-CM

## 2019-05-10 DIAGNOSIS — I5189 Other ill-defined heart diseases: Secondary | ICD-10-CM

## 2019-05-10 LAB — BASIC METABOLIC PANEL
Anion gap: 17 — ABNORMAL HIGH (ref 5–15)
Anion gap: 14 (ref 5–15)
BUN: 29 mg/dL — ABNORMAL HIGH (ref 8–23)
BUN: 34 mg/dL — ABNORMAL HIGH (ref 8–23)
CO2: 23 mmol/L (ref 22–32)
CO2: 24 mmol/L (ref 22–32)
Calcium: 8.8 mg/dL — ABNORMAL LOW (ref 8.9–10.3)
Calcium: 8.8 mg/dL — ABNORMAL LOW (ref 8.9–10.3)
Chloride: 98 mmol/L (ref 98–111)
Chloride: 104 mmol/L (ref 98–111)
Creatinine, Ser: 1.25 mg/dL — ABNORMAL HIGH (ref 0.44–1.00)
Creatinine, Ser: 1.57 mg/dL — ABNORMAL HIGH (ref 0.44–1.00)
GFR calc Af Amer: 45 mL/min — ABNORMAL LOW (ref >60)
GFR calc Af Amer: 34 mL/min — ABNORMAL LOW (ref >60)
GFR calc non Af Amer: 39 mL/min — ABNORMAL LOW (ref >60)
GFR calc non Af Amer: 29 mL/min — ABNORMAL LOW (ref >60)
Glucose, Bld: 343 mg/dL — ABNORMAL HIGH (ref 70–99)
Glucose, Bld: 125 mg/dL — ABNORMAL HIGH (ref 70–99)
Potassium: 5.1 mmol/L (ref 3.5–5.1)
Potassium: 5.1 mmol/L (ref 3.5–5.1)
Sodium: 138 mmol/L (ref 135–145)
Sodium: 142 mmol/L (ref 135–145)

## 2019-05-10 LAB — GLUCOSE, CAPILLARY
Glucose-Capillary: 192 mg/dL — ABNORMAL HIGH (ref 70–99)
Glucose-Capillary: 147 mg/dL — ABNORMAL HIGH (ref 70–99)
Glucose-Capillary: 116 mg/dL — ABNORMAL HIGH (ref 70–99)
Glucose-Capillary: 169 mg/dL — ABNORMAL HIGH (ref 70–99)
Glucose-Capillary: 253 mg/dL — ABNORMAL HIGH (ref 70–99)

## 2019-05-10 LAB — CBC
HCT: 38.3 % (ref 36.0–46.0)
Hemoglobin: 11.4 g/dL — ABNORMAL LOW (ref 12.0–15.0)
MCH: 30.5 pg (ref 26.0–34.0)
MCHC: 29.8 g/dL — ABNORMAL LOW (ref 30.0–36.0)
MCV: 102.4 fL — ABNORMAL HIGH (ref 80.0–100.0)
Platelets: 179 10*3/uL (ref 150–400)
RBC: 3.74 MIL/uL — ABNORMAL LOW (ref 3.87–5.11)
RDW: 14.3 % (ref 11.5–15.5)
WBC: 13.3 10*3/uL — ABNORMAL HIGH (ref 4.0–10.5)
nRBC: 0 % (ref 0.0–0.2)

## 2019-05-10 LAB — URINALYSIS, ROUTINE W REFLEX MICROSCOPIC
Bilirubin Urine: NEGATIVE
Glucose, UA: NEGATIVE mg/dL
Hgb urine dipstick: NEGATIVE
Ketones, ur: NEGATIVE mg/dL
Leukocytes,Ua: NEGATIVE
Nitrite: NEGATIVE
Protein, ur: NEGATIVE mg/dL
Specific Gravity, Urine: 1.014 (ref 1.005–1.030)
pH: 5 (ref 5.0–8.0)

## 2019-05-10 LAB — MAGNESIUM: Magnesium: 2.2 mg/dL (ref 1.7–2.4)

## 2019-05-10 LAB — HEPATIC FUNCTION PANEL
ALT: 53 U/L — ABNORMAL HIGH (ref 0–44)
AST: 57 U/L — ABNORMAL HIGH (ref 15–41)
Albumin: 4.1 g/dL (ref 3.5–5.0)
Alkaline Phosphatase: 44 U/L (ref 38–126)
Bilirubin, Direct: 0.2 mg/dL (ref 0.0–0.2)
Indirect Bilirubin: 0.5 mg/dL (ref 0.3–0.9)
Total Bilirubin: 0.7 mg/dL (ref 0.3–1.2)
Total Protein: 7.4 g/dL (ref 6.5–8.1)

## 2019-05-10 LAB — MRSA PCR SCREENING: MRSA by PCR: NEGATIVE

## 2019-05-10 MED ORDER — IPRATROPIUM BROMIDE 0.02 % IN SOLN
0.5000 mg | Freq: Four times a day (QID) | RESPIRATORY_TRACT | Status: DC
  Administered 2019-05-10 – 2019-05-11 (×3): 0.5 mg via RESPIRATORY_TRACT
  Filled 2019-05-10 (×3): qty 2.5

## 2019-05-10 MED ORDER — LACTATED RINGERS IV SOLN: INTRAVENOUS | Status: DC

## 2019-05-10 MED ORDER — ALBUTEROL SULFATE (2.5 MG/3ML) 0.083% IN NEBU: 2.5000 mg | INHALATION_SOLUTION | Freq: Three times a day (TID) | RESPIRATORY_TRACT | Status: DC

## 2019-05-10 MED ORDER — SODIUM CHLORIDE 0.9 % IV SOLN
100.0000 mg | Freq: Two times a day (BID) | INTRAVENOUS | Status: DC
  Administered 2019-05-10 – 2019-05-13 (×6): 100 mg via INTRAVENOUS
  Filled 2019-05-10 (×7): qty 100

## 2019-05-10 MED ORDER — METHYLPREDNISOLONE SODIUM SUCC 40 MG IJ SOLR
40.0000 mg | Freq: Two times a day (BID) | INTRAMUSCULAR | Status: DC
  Administered 2019-05-10 – 2019-05-11 (×2): 40 mg via INTRAVENOUS
  Filled 2019-05-10 (×2): qty 1

## 2019-05-10 MED ORDER — ENSURE ENLIVE PO LIQD
237.0000 mL | Freq: Three times a day (TID) | ORAL | Status: DC
  Administered 2019-05-10 – 2019-05-15 (×14): 237 mL via ORAL

## 2019-05-10 MED ORDER — INSULIN GLARGINE 100 UNIT/ML ~~LOC~~ SOLN
6.0000 [IU] | Freq: Every day | SUBCUTANEOUS | Status: DC
  Administered 2019-05-10 – 2019-05-13 (×4): 6 [IU] via SUBCUTANEOUS
  Filled 2019-05-10 (×4): qty 0.06

## 2019-05-10 NOTE — Plan of Care (Signed)

## 2019-05-10 NOTE — Progress Notes (Signed)
PT Cancellation Note  Patient Details Name: [name] MRN: [id number] DOB: 03/24/1930   Cancelled Treatment:    Reason Eval/Treat Not Completed: Patient not medically ready. Per RN, pt currently unable to tolerate PT from respiratory standpoint. PT will continue to follow and evaluate as appropriate.   Deland Pretty, DPT   Acute Rehabilitation Department Pager #: [phone]   Gaetana Michaelis 05/10/2019, 11:26 AM

## 2019-05-10 NOTE — Progress Notes (Addendum)
NAME:  [name], MRN:  [id number], DOB:  1929/05/11, LOS: 1 ADMISSION DATE:  05/09/2019, CONSULTATION DATE:  05/10/19 REFERRING MD:  Hartley Barefoot CHIEF COMPLAINT:  SOB   Brief History   53 year woman with hx of HFPEF, COPD on HOT p/w SOB, palpitations.   History of present illness   3 year woman with hx of HFPEF, COPD on HOT p/w SOB, palpitations.   FEV1 in 2017 was 0.66L (60% pred).  Found to be in afib/RVR controlled with digoxin and CCB.  Had some nausea this AM so not placed on BIPAP.  Now no nausea.  She has had anorexia for 2 days.  Her CXR is benign.  UA ordered but not collected.  Diuresis attempted but has resulted in worsening renal function.  Bedside US by cardiology service shows severe mitral valve regurgitation, formal echo pending.  Past Medical History   Past Medical History:  Diagnosis Date  . Acute bronchitis   . Acute on chronic respiratory failure with hypoxia (HCC)   . Alzheimer disease (HCC) 06/04/2018  . Benign hypertensive renal disease   . CAD (coronary artery disease)   . Cardiomyopathy    non-ischemic 04/2008 EF 45% cardiac MRI no scar  . CHF (congestive heart failure) (HCC)   . Chronic sinusitis   . COPD (chronic obstructive pulmonary disease) (HCC)   . COPD exacerbation (HCC)   . Depression   . Diabetes mellitus without complication, without long-term current use of insulin (HCC)   . Dyslipidemia associated with type 2 diabetes mellitus (HCC)   . Dyspnea on exertion    chronic  . Heart disease   . Hypercholesterolemia   . Hypertension   . On home oxygen therapy    "3L; 24/7; for the last 2 1/2 weeks; none before that" (07/26/2016)  . Protein-calorie malnutrition, severe (HCC)   . Sinus headache    "chronic"  . Stroke [facility]) 1997   "mild"  . Type II diabetes mellitus (HCC)      Significant Hospital Events   1/14 admitted  Consults:  Cardiology, PCCM  Procedures:  N/A  Significant Diagnostic Tests:  CXR stable  hyperinflation  Micro Data:  COVID neg  Antimicrobials:  Ceftriaxone 1/15 >>   Interim history/subjective:  Consulted  Objective   Blood pressure 126/61, pulse 97, temperature 99 F (37.2 C), temperature source Rectal, resp. rate (!) 26, height 5' (1.524 m), weight 40.4 kg, SpO2 92 %.        Intake/Output Summary (Last 24 hours) at 05/10/2019 1320 Last data filed at 05/10/2019 3976 Gross per 24 hour  Intake 6 ml  Output --  Net 6 ml   Filed Weights   05/09/19 0610 05/10/19 0337  Weight: 46.7 kg 40.4 kg    Examination: General: Frail cachectic elderly woman lying in bed HENT:  temporal wasting noted, mucous membranes dry Lungs: Lungs are severely diminished bilaterally without wheezing Cardiovascular: Heart sounds are regular, distant, I do not hear an overt murmur Abdomen: Abdomen soft with positive bowel sounds Extremities: Muscle wasting, no edema Neuro: Moves all 4 extremities to command, she is very forgetful Skin:  No rashes  Resolved Hospital Problem list   N/A  Assessment & Plan:  # Oxygen dependent COPD- presumed in flare # Nausea/vomiting-  unclear etiology, moving bowels, abd exam benign # AKI- worsened with diuresis, she appears dry # Atrial fibrillation with rapid ventricular response- question of severe mitral insufficiency on bedside ultrasound by cardiology, formal echo pending # Dementia #  Frail elderly- approaching end-of-life  - Steroids are fine, I dropped them a bit - Switch neb regimen to brovana/ipratropium/budesonide - f/u on UA - Add doxycycline to ceftriaxone for atypical coverage, if UA neg, D/C ceftriaxone - The patient's outpatient pulmonologist has already had GoC discussions with family, they are not ready at this time to make her DNI - Hold on BIPAP, patient has intermittent N/V and is not aware of when it comes on due to her memory issues - Palliative care consult  I circled back to talk with daughter again about goals of care  but she left for day.  Patient resting more comfortably. Will check on tomorrow.   Labs   CBC: Recent Labs  Lab 05/09/19 0627 05/10/19 0202  WBC 14.7* 13.3*  HGB 11.6* 11.4*  HCT 37.9 38.3  MCV 103.3* 102.4*  PLT 176 179    Basic Metabolic Panel: Recent Labs  Lab 05/09/19 0627 05/10/19 0202 05/10/19 0952  NA 139 138 142  K 4.3 5.1 5.1  CL 100 98 104  CO2 23 23 24   GLUCOSE 341* 343* 125*  BUN 16 29* 34*  CREATININE 0.90 1.25* 1.57*  CALCIUM 9.0 8.8* 8.8*  MG  --  2.2  --    GFR: Estimated Creatinine Clearance: 16.1 mL/min (A) (by C-G formula based on SCr of 1.57 mg/dL (H)). Recent Labs  Lab 05/09/19 0627 05/10/19 0202  WBC 14.7* 13.3*    Liver Function Tests: Recent Labs  Lab 05/09/19 0627 05/10/19 0202  AST 52* 57*  ALT 52* 53*  ALKPHOS 45 44  BILITOT 1.0 0.7  PROT 7.3 7.4  ALBUMIN 4.0 4.1   No results for input(s): LIPASE, AMYLASE in the last 168 hours. No results for input(s): AMMONIA in the last 168 hours.  ABG    Component Value Date/Time   PHART 7.416 07/27/2016 1015   PCO2ART 47.9 07/27/2016 1015   PO2ART 116 (H) 07/27/2016 1015   HCO3 30.2 (H) 07/27/2016 1015   O2SAT 98.0 07/27/2016 1015     Coagulation Profile: No results for input(s): INR, PROTIME in the last 168 hours.  Cardiac Enzymes: No results for input(s): CKTOTAL, CKMB, CKMBINDEX, TROPONINI in the last 168 hours.  HbA1C: Hgb A1c MFr Bld  Date/Time Value Ref Range Status  05/09/2019 02:43 PM 6.0 (H) 4.8 - 5.6 % Final    Comment:    (NOTE) Pre diabetes:          5.7%-6.4% Diabetes:              >6.4% Glycemic control for   <7.0% adults with diabetes   07/31/2016 10:45 AM 6.9 (H) 4.8 - 5.6 % Final    Comment:    (NOTE)         Pre-diabetes: 5.7 - 6.4         Diabetes: >6.4         Glycemic control for adults with diabetes: <7.0     CBG: Recent Labs  Lab 05/09/19 2157 05/09/19 2324 05/10/19 0439 05/10/19 0716 05/10/19 1006  GLUCAP 266* 303* 192* 147* 116*     Review of Systems:   She cannot really answer any of these questions.  Past Medical History  She,  has a past medical history of Acute bronchitis, Acute on chronic respiratory failure with hypoxia (HCC), Alzheimer disease (HCC) (06/04/2018), Benign hypertensive renal disease, CAD (coronary artery disease), Cardiomyopathy, CHF (congestive heart failure) (HCC), Chronic sinusitis, COPD (chronic obstructive pulmonary disease) (HCC), COPD exacerbation (HCC), Depression, Diabetes mellitus  without complication, without long-term current use of insulin ([place]), Dyslipidemia associated with type 2 diabetes mellitus ([place]), Dyspnea on exertion, Heart disease, Hypercholesterolemia, Hypertension, On home oxygen therapy, Protein-calorie malnutrition, severe ([place]), Sinus headache, Stroke ([place]) (1997), and Type II diabetes mellitus ([place]).   Surgical History    Past Surgical History:  Procedure Laterality Date  . carotid angiogram     2006  . ESOPHAGOGASTRODUODENOSCOPY     with foreign body removal  . NASAL SINUS SURGERY  ~1974;~ 1976  . TONSILLECTOMY     "when I was a chld"     Social History   reports that she quit smoking about 2 years ago. Her smoking use included cigarettes. She started smoking about 70 years ago. She has a 52.50 pack-year smoking history. She has never used smokeless tobacco. She reports that she does not drink alcohol or use drugs.   Family History   Her family history includes Alcohol abuse in her brother and sister; Coronary artery disease in an other family member; Heart disease in her father.   Allergies No Known Allergies   Home Medications  Prior to Admission medications   Medication Sig Start Date End Date Taking? Authorizing Provider  albuterol (PROVENTIL) (2.5 MG/3ML) 0.083% nebulizer solution Take 3 mLs (2.5 mg total) by nebulization every 4 (four) hours as needed for wheezing or shortness of breath. 05/04/18  Yes Icard, Bradley L, DO  albuterol (VENTOLIN HFA) 108  (90 Base) MCG/ACT inhaler Inhale 2 puffs into the lungs every 6 (six) hours as needed for wheezing or shortness of breath. 03/29/19 05/09/19 Yes Icard, Octavio Graves, DO  aspirin EC 81 MG tablet Take 81 mg by mouth daily.   Yes [provider]  atorvastatin (LIPITOR) 10 MG tablet TAKE 1 TABLET(10 MG) BY MOUTH DAILY AT 6 PM Patient taking differently: Take 10 mg by mouth daily at 6 PM.  01/25/19  Yes Josue Hector, MD  benazepril (LOTENSIN) 10 MG tablet Take 10 mg by mouth 2 (two) times daily.    Yes [provider]  budesonide-formoterol (SYMBICORT) 160-4.5 MCG/ACT inhaler INHALE 2 PUFFS INTO THE LUNGS TWICE DAILY 09/06/18  Yes Icard, Bradley L, DO  carvedilol (COREG) 6.25 MG tablet Take 1 tablet (6.25 mg total) by mouth 2 (two) times daily with a meal. 05/24/16  Yes Burtis Junes, NP  digoxin (LANOXIN) 0.125 MG tablet TAKE 1/2 TABLET BY MOUTH EVERY DAY. PLEASE KEEP APPT IN SEPT FOR FUTURE REFILLS Patient taking differently: Take 0.0625 mg by mouth daily.  01/23/19  Yes Josue Hector, MD  feeding supplement (BOOST HIGH PROTEIN) LIQD Take 1 Container by mouth as needed (between meal).   Yes [provider]  ferrous sulfate 325 (65 FE) MG tablet Take 325 mg by mouth daily with breakfast.   Yes [provider]  fluticasone (FLONASE) 50 MCG/ACT nasal spray Place 1 spray into both nostrils daily as needed for allergies. 06/09/16  Yes [provider]  furosemide (LASIX) 20 MG tablet TAKE 1 TABLET BY MOUTH ONCE DAILY Patient taking differently: Take 20 mg by mouth daily.  01/25/19  Yes Josue Hector, MD  insulin aspart (NOVOLOG) 100 UNIT/ML injection Inject into the skin daily as needed for high blood sugar (For CBG >300). As needed   Yes [provider]  Lactobacillus (PROBIOTIC ACIDOPHILUS PO) Take 1 capsule by mouth daily.   Yes [provider]  memantine (NAMENDA) 10 MG tablet TAKE 1 TABLET(10 MG) BY MOUTH TWICE DAILY Patient taking  differently: Take 10 mg by mouth 2 (two) times daily.  04/24/19  Yes York Spaniel, MD  metFORMIN (GLUCOPHAGE-XR) 500 MG 24 hr tablet Take 1,000 mg by mouth 2 (two) times daily.  03/13/15  Yes [provider]  Multiple Vitamin (MV-ONE PO) Take 1 tablet by mouth daily.   Yes [provider]  Spacer/Aero-Holding Chambers (AEROCHAMBER MV) inhaler Use as instructed 02/21/17  Yes Michele Mcalpine, MD  Tiotropium Bromide Monohydrate (SPIRIVA RESPIMAT) 2.5 MCG/ACT AERS Inhale 2 puffs into the lungs daily. 05/24/18  Yes Icard, Rachel Bo, DO  vitamin B-12 (CYANOCOBALAMIN) 500 MCG tablet Take 1,000 mcg by mouth daily.    Yes [provider]  INCRUSE ELLIPTA 62.5 MCG/INH AEPB INHALE 1 PUFF INTO THE LUNGS DAILY Patient not taking: Reported on 05/09/2019 10/05/17   Michele Mcalpine, MD

## 2019-05-10 NOTE — Progress Notes (Signed)
Subjective:  No complaints visually has tachypnea Bear Hugger on for warming   Objective:  Vitals:   05/10/19 0322 05/10/19 0337 05/10/19 0723 05/10/19 0730  BP:   119/63   Pulse:   71 77  Resp:   (!) 26 (!) 21  Temp: (!) 95.2 F (35.1 C)  (!) 96.5 F (35.8 C)   TempSrc: Rectal  Rectal   SpO2:   (!) 88% 95%  Weight:  40.4 kg    Height:        Intake/Output from previous day:  Intake/Output Summary (Last 24 hours) at 05/10/2019 0745 Last data filed at 05/09/2019 0856 Gross per 24 hour  Intake 50 ml  Output --  Net 50 ml    Physical Exam: Affect appropriate Frail elderly black female  HEENT: normal Neck supple with no adenopathy JVP elevated  no bruits no thyromegaly Lungs clear with no wheezing and good diaphragmatic motion Heart:  S1/S2 MR  murmur, no rub, gallop or click PMI normal Abdomen: benighn, BS positve, no tenderness, no AAA no bruit.  No HSM or HJR Distal pulses intact with no bruits No edema Neuro non-focal Skin warm and dry No muscular weakness Bear Hugger warming patient    Lab Results: Basic Metabolic Panel: Recent Labs    05/09/19 0627 05/10/19 0202  NA 139 138  K 4.3 5.1  CL 100 98  CO2 23 23  GLUCOSE 341* 343*  BUN 16 29*  CREATININE 0.90 1.25*  CALCIUM 9.0 8.8*  MG  --  2.2   Liver Function Tests: Recent Labs    05/09/19 0627 05/10/19 0202  AST 52* 57*  ALT 52* 53*  ALKPHOS 45 44  BILITOT 1.0 0.7  PROT 7.3 7.4  ALBUMIN 4.0 4.1   No results for input(s): LIPASE, AMYLASE in the last 72 hours. CBC: Recent Labs    05/09/19 0627 05/10/19 0202  WBC 14.7* 13.3*  HGB 11.6* 11.4*  HCT 37.9 38.3  MCV 103.3* 102.4*  PLT 176 179    Recent Labs    05/09/19 1443  HGBA1C 6.0*   Fasting Lipid Panel: No results for input(s): CHOL, HDL, LDLCALC, TRIG, CHOLHDL, LDLDIRECT in the last 72 hours. Thyroid Function Tests: Recent Labs    05/09/19 2141  TSH 1.429   Anemia Panel: No results for input(s): VITAMINB12,  FOLATE, FERRITIN, TIBC, IRON, RETICCTPCT in the last 72 hours.  Imaging: DG Chest Portable 1 View  Result Date: 05/09/2019 CLINICAL DATA:  Shortness of breath EXAM: PORTABLE CHEST 1 VIEW COMPARISON:  June 15, 2018 FINDINGS: There is mild cardiomegaly. Aortic knob calcifications. Slight hyperinflation of the upper lung zones. Both lungs are clear. The visualized skeletal structures are unremarkable. IMPRESSION: No active disease. Electronically Signed   By: Prudencio Pair M.D.   On: 05/09/2019 06:34    Cardiac Studies:  ECG: afib rate 161 chronic RBBB    Telemetry: NSR rates 90-100  Echo:  Prelim bedside review personally EF 40% severe MR   Medications:   . albuterol  2.5 mg Nebulization QID  . arformoterol  15 mcg Nebulization BID  . aspirin EC  81 mg Oral Daily  . atorvastatin  10 mg Oral q1800  . benazepril  10 mg Oral BID  . budesonide (PULMICORT) nebulizer solution  0.5 mg Nebulization BID  . carvedilol  6.25 mg Oral BID WC  . digoxin  0.0625 mg Oral Daily  . enoxaparin (LOVENOX) injection  40 mg Subcutaneous Daily  . furosemide  40  mg Intravenous BID  . guaiFENesin  600 mg Oral BID  . insulin aspart  0-9 Units Subcutaneous Q6H  . insulin glargine  6 Units Subcutaneous Daily  . memantine  10 mg Oral BID  . methylPREDNISolone (SOLU-MEDROL) injection  60 mg Intravenous Q8H  . sodium chloride flush  3 mL Intravenous Q12H  . sodium chloride flush  3 mL Intravenous Q12H  . umeclidinium bromide  2 puff Inhalation Daily     . sodium chloride    . cefTRIAXone (ROCEPHIN)  IV Stopped (05/09/19 2127)  . diltiazem (CARDIZEM) infusion Stopped (05/10/19 0009)    Assessment/Plan:  [name] is a 86 y.o. female with a hx of systolic and diastolic CHF, HTN, DM2, COPD on home O2 (follows with pulmonary medicine) with ongoing tobacco abuse and atrial fibrillation not on anticoagulation who is being seen today for the evaluation of at the request of Dr. Katrinka Blazing.   1. Afib :  converted to NSR this am  continue oral digoxin and coreg. Continue iv cardizem drip until breathing status improves Rate was > 160 in ER initially now NSR  Dig level was low She has not been anticoagulated in past due to extreme low body weight and fall risk   2. CHF:  Bedside review of echo shows severe MR which is new since 2018 and similar EF 40% range Given age and weight not a candidate for mitral clip. Continue iv lasix coreg , digoxin and lotensin Hopefully degree of MR will be less as her COPD improves and now that she is not in rapid afib   3. COPD:  On home oxygen quit smoking 2 years ago significant wheezing on exam steroids and nebs per primary service WBC is coming down on Rocephin   I spoke with her daughter [name] and she indicates her mother and her discussed being intubated but not being shocked or having her Heart restarted if she were to have arrhythmia or asystole Have noted partial code in chart   [name] 05/10/2019, 7:45 AM

## 2019-05-10 NOTE — Progress Notes (Signed)
PROGRESS NOTE    [name]  WUJ:[id number] DOB: [birth year] DOA: 05/09/2019 PCP: Patient, No Pcp Per   Brief Narrative: 86 year old with past medical history significant for hypertension, combined systolic and diastolic heart failure, A. 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  Patient wake up her daughter at 4:30 AM stating that she is not able to breath.  Shortness of breath did not improve with breathing treatments.  Patient presented to the ED for further evaluation. In ED patient was found to be in A. fib RVR, heart rate up to 160, she was tachypneic, chest x-ray showed mild cardiomegaly without signs of edema.  Patient received IV Solu-Medrol, IV Lasix and magnesium sulfate.   Assessment & Plan:   Principal Problem:   Acute on chronic respiratory failure with hypoxia (HCC) Active Problems:   Diabetes mellitus without complication, without long-term current use of insulin (HCC)   COPD with acute exacerbation (HCC)   Alzheimer disease (HCC)   Atrial fibrillation with RVR (HCC)   Combined systolic and diastolic cardiac dysfunction   Transaminitis  1-Acute om  Chronic hypoxic respiratory failure secondary to COPD exacerbation and A. fib with RVR: Continue with IV Solu-Medrol, Brovana with desonide, albuterol. Patient very tachypneic, still complaining of shortness of breath.  She just vomited unable to place on BiPAP. Pulmonology is consulted. Getting IV Lasix.  2-Paroxysmal A. fib with RVR; patient presented with heart rate in the 160 RVR. On aspirin, not on anticoagulation due to high risk for fall. Continue with Cardizem drip. Appreciate cardiology follow-up. Echo pending.  3-Leukocytosis: White blood cell trending down.  Chest X ray  negative for pneumonia.  4-Combined systolic and  diastolic congestive heart failure: BNP elevated 1267. Continue with IV Lasix 40 mg IV twice daily. Cardiology following.  5-Diabetes type 2: Hyperglycemia on admission. Continue with a sliding scale insulin. We will add low-dose Lantus  6-Hypertensive urgency: Improved on Coreg  7-Transaminases: Follow trend  8-Alzheimer's dementia: Mild per family. Continue with Namenda  AKI: Continue to monitor on IV Laxis. Hold ACE.   Pressure Injury 07/26/16 (Active)  07/26/16 1837  Location: Sacrum  Location Orientation: Bilateral  Staging:   Wound Description (Comments):   Present on Admission: Yes     Pressure Injury 07/26/16 Stage I -  Intact skin with non-blanchable redness of a localized area usually over a bony prominence. (Active)  07/26/16 1839  Location: Buttocks  Location Orientation: Left  Staging: Stage I -  Intact skin with non-blanchable redness of a localized area usually over a bony prominence.  Wound Description (Comments):   Present on Admission: Yes    Estimated body mass index is 17.39 kg/m as calculated from the following:   Height as of this encounter: 5' (1.524 m).   Weight as of this encounter: 40.4 kg.   DVT prophylaxis: Lovenox Code Status: Partial Code: No Chest compression, Yes to medication, Intubation, defibrillation..  Family Communication: Care discussed with Daughter who was at bedside.  Disposition Plan: Remain in the hospital for treatment of resp failure Consultants:   Cardiology  Pulmonary   Procedures:   ECHO  Antimicrobials:    Subjective: Report SOB, same as yesterday.    Objective: Vitals:   05/10/19 0322 05/10/19 0337 05/10/19 0723 05/10/19 0730  BP:   119/63   Pulse:   71 77  Resp:   Marland Kitchen)  26 (!) 21  Temp: (!) 95.2 F (35.1 C)  (!) 96.5 F (35.8 C)   TempSrc: Rectal  Rectal   SpO2:   (!) 88% 95%  Weight:  40.4 kg    Height:        Intake/Output Summary (Last 24 hours) at 05/10/2019 0735 Last data filed at  05/09/2019 0856 Gross per 24 hour  Intake 50 ml  Output --  Net 50 ml   Filed Weights   05/09/19 0610 05/10/19 0337  Weight: 46.7 kg 40.4 kg    Examination:  General exam: Thin,  Respiratory system: Clear to auscultation. Tachypnea.  Cardiovascular system: S1 & S2 heard, RRR. No JVD, murmurs, rubs, gallops or clicks. No pedal edema. Gastrointestinal system: Abdomen is nondistended, soft and nontender. No organomegaly or masses felt. Normal bowel sounds heard. Central nervous system: Alert and oriented. No focal neurological deficits. Extremities: Symmetric 5 x 5 power. Skin: No rashes, lesions or ulcers Psychiatry: Judgement and insight appear normal. Mood & affect appropriate.     Data Reviewed: I have personally reviewed following labs and imaging studies  CBC: Recent Labs  Lab 05/09/19 0627 05/10/19 0202  WBC 14.7* 13.3*  HGB 11.6* 11.4*  HCT 37.9 38.3  MCV 103.3* 102.4*  PLT 176 179   Basic Metabolic Panel: Recent Labs  Lab 05/09/19 0627 05/10/19 0202  NA 139 138  K 4.3 5.1  CL 100 98  CO2 23 23  GLUCOSE 341* 343*  BUN 16 29*  CREATININE 0.90 1.25*  CALCIUM 9.0 8.8*  MG  --  2.2   GFR: Estimated Creatinine Clearance: 20.2 mL/min (A) (by C-G formula based on SCr of 1.25 mg/dL (H)). Liver Function Tests: Recent Labs  Lab 05/09/19 0627 05/10/19 0202  AST 52* 57*  ALT 52* 53*  ALKPHOS 45 44  BILITOT 1.0 0.7  PROT 7.3 7.4  ALBUMIN 4.0 4.1   No results for input(s): LIPASE, AMYLASE in the last 168 hours. No results for input(s): AMMONIA in the last 168 hours. Coagulation Profile: No results for input(s): INR, PROTIME in the last 168 hours. Cardiac Enzymes: No results for input(s): CKTOTAL, CKMB, CKMBINDEX, TROPONINI in the last 168 hours. BNP (last 3 results) No results for input(s): PROBNP in the last 8760 hours. HbA1C: Recent Labs    05/09/19 1443  HGBA1C 6.0*   CBG: Recent Labs  Lab 05/09/19 1537 05/09/19 2157 05/09/19 2324  05/10/19 0439 05/10/19 0716  GLUCAP 226* 266* 303* 192* 147*   Lipid Profile: No results for input(s): CHOL, HDL, LDLCALC, TRIG, CHOLHDL, LDLDIRECT in the last 72 hours. Thyroid Function Tests: Recent Labs    05/09/19 2141  TSH 1.429   Anemia Panel: No results for input(s): VITAMINB12, FOLATE, FERRITIN, TIBC, IRON, RETICCTPCT in the last 72 hours. Sepsis Labs: No results for input(s): PROCALCITON, LATICACIDVEN in the last 168 hours.  Recent Results (from the past 240 hour(s))  SARS CORONAVIRUS 2 (TAT 6-24 HRS) Nasopharyngeal Nasopharyngeal Swab     Status: None   Collection Time: 05/09/19  6:56 AM   Specimen: Nasopharyngeal Swab  Result Value Ref Range Status   SARS Coronavirus 2 NEGATIVE NEGATIVE Final    Comment: (NOTE) SARS-CoV-2 target nucleic acids are NOT DETECTED. The SARS-CoV-2 RNA is generally detectable in upper and lower respiratory specimens during the acute phase of infection. Negative results do not preclude SARS-CoV-2 infection, do not rule out co-infections with other pathogens, and should not be used as the sole basis for treatment or other patient management  decisions. Negative results must be combined with clinical observations, patient history, and epidemiological information. The expected result is Negative. Fact Sheet for Patients: HairSlick.no Fact Sheet for Healthcare Providers: quierodirigir.com This test is not yet approved or cleared by the [place] FDA and  has been authorized for detection and/or diagnosis of SARS-CoV-2 by FDA under an Emergency Use Authorization (EUA). This EUA will remain  in effect (meaning this test can be used) for the duration of the COVID-19 declaration under Section 56 4(b)(1) of the Act, 21 U.S.C. section 360bbb-3(b)(1), unless the authorization is terminated or revoked sooner. Performed at [facility], [address], [place], [place] [address]    MRSA PCR Screening     Status: None   Collection Time: 05/09/19 10:30 PM   Specimen: Nasopharyngeal  Result Value Ref Range Status   MRSA by PCR NEGATIVE NEGATIVE Final    Comment:        The GeneXpert MRSA Assay (FDA approved for NASAL specimens only), is one component of a comprehensive MRSA colonization surveillance program. It is not intended to diagnose MRSA infection nor to guide or monitor treatment for MRSA infections. Performed at [facility], [address] [place], [place] [address]          Radiology Studies: DG Chest Portable 1 View  Result Date: 05/09/2019 CLINICAL DATA:  Shortness of breath EXAM: PORTABLE CHEST 1 VIEW COMPARISON:  June 15, 2018 FINDINGS: There is mild cardiomegaly. Aortic knob calcifications. Slight hyperinflation of the upper lung zones. Both lungs are clear. The visualized skeletal structures are unremarkable. IMPRESSION: No active disease. Electronically Signed   By: Jonna Clark M.D.   On: 05/09/2019 06:34        Scheduled Meds: . albuterol  2.5 mg Nebulization QID  . arformoterol  15 mcg Nebulization BID  . aspirin EC  81 mg Oral Daily  . atorvastatin  10 mg Oral q1800  . benazepril  10 mg Oral BID  . budesonide (PULMICORT) nebulizer solution  0.5 mg Nebulization BID  . carvedilol  6.25 mg Oral BID WC  . digoxin  0.0625 mg Oral Daily  . enoxaparin (LOVENOX) injection  40 mg Subcutaneous Daily  . furosemide  40 mg Intravenous BID  . guaiFENesin  600 mg Oral BID  . insulin aspart  0-9 Units Subcutaneous Q6H  . insulin glargine  6 Units Subcutaneous Daily  . memantine  10 mg Oral BID  . methylPREDNISolone (SOLU-MEDROL) injection  60 mg Intravenous Q8H  . sodium chloride flush  3 mL Intravenous Q12H  . sodium chloride flush  3 mL Intravenous Q12H  . umeclidinium bromide  2 puff Inhalation Daily   Continuous Infusions: . sodium chloride    . cefTRIAXone (ROCEPHIN)  IV Stopped (05/09/19 2127)  . diltiazem (CARDIZEM)  infusion Stopped (05/10/19 0009)     LOS: 1 day    Time spent: 35 minutes    Jylian Pappalardo A Georgia Delsignore, MD [facility]   If 7PM-7AM, please contact night-coverage [email or web address] Password [facility] 05/10/2019, 7:35 AM

## 2019-05-10 NOTE — Progress Notes (Signed)
Initial Nutrition Assessment  DOCUMENTATION CODES:   Underweight, Severe malnutrition in context of chronic illness  INTERVENTION:   - Liberalize diet to Regular, verbal with readback order placed per MD  - Ensure Enlive po TID, each supplement provides 350 kcal and 20 grams of protein  NUTRITION DIAGNOSIS:   Severe Malnutrition related to chronic illness (COPD, CHF, Alzheimer's) as evidenced by severe fat depletion, severe muscle depletion.  GOAL:   Patient will meet greater than or equal to 90% of their needs  MONITOR:   PO intake, Supplement acceptance, Labs, Weight trends  REASON FOR ASSESSMENT:   Other (low BMI)    ASSESSMENT:   86 year old female who presented to the ED on 1/14 with SOB. PMH of CHF, HTN, T2DM, COPD, atrial fibrillation, Alzheimer's disease.   Pt currently on a Heart Healthy/Carb Modified diet. No meal completions recorded at this time. RD discussed liberalizing pt's diet with MD who agreed. Regular diet ordered.  Spoke with pt's daughter [name] (who pt lives with) at bedside. Pt's daughter reports that pt eats 3 meals daily with snacks between meals. Pt's daughter notes that pt does well with breakfast and lunch but does not eat as much at dinner. Pt's daughter shares that for the last 2 days PTA, pt's PO intake had decreased overall.  Breakfast: oatmeal and fruit cup Lunch: tuna salad or chicken salad with crackers and fruit cup OR chicken noodle soup and half of a sandwich PM Snack: fruit cup Dinner: protein (fish or chicken) and vegetables HS Snack: applesauce, grapes, Boost  Pt's daughter reports that pt has been eating less protein at dinner meals but has been eating more vegetables. Pt's daughter provides pt with a Boost in the evening when pt has not eaten well at dinner.  Pt's daughter shares that pt's UBW over the last 2 years is between 90-92 lbs. Pt's daughters states that 2 years ago, pt was hospitalized and her weight got down to 76-78  lbs. The highest that pt's weight has been over the last 2 years is 96 lbs.  Reviewed weight history in chart. Weight stable over the last year.  Pt's daughter reports that pt did not eat breakfast this morning and vomited after trying to take medications. Pt's daughter is unsure of the plan for lunch. RD will order Ensure Enlive shakes between meals.  Medications reviewed and include: Lasix, SSI, Lantus 6 units, solu-medrol, IV abx  Labs reviewed: elevated LFTs, BUN 34, creatinine 1.57 CBG's: 51-303 x 24 hours  NUTRITION - FOCUSED PHYSICAL EXAM:    Most Recent Value  Orbital Region  Severe depletion  Upper Arm Region  Severe depletion  Thoracic and Lumbar Region  Severe depletion  Buccal Region  Severe depletion  Temple Region  Severe depletion  Clavicle Bone Region  Severe depletion  Clavicle and Acromion Bone Region  Severe depletion  Scapular Bone Region  Severe depletion  Dorsal Hand  Severe depletion  Patellar Region  Severe depletion  Anterior Thigh Region  Severe depletion  Posterior Calf Region  Severe depletion  Edema (RD Assessment)  None  Hair  Reviewed  Eyes  Reviewed  Mouth  Reviewed  Skin  Reviewed  Nails  Reviewed       Diet Order:   Diet Order            Diet regular Room service appropriate? Yes; Fluid consistency: Thin  Diet effective now              EDUCATION NEEDS:  Education needs have been addressed  Skin:  Skin Assessment: Reviewed RN Assessment  Last BM:  05/09/19 type 6  Height:   Ht Readings from Last 1 Encounters:  05/09/19 5' (1.524 m)    Weight:   Wt Readings from Last 1 Encounters:  05/10/19 40.4 kg    Ideal Body Weight:  45.5 kg  BMI:  Body mass index is 17.39 kg/m.  Estimated Nutritional Needs:   Kcal:  1200-1400  Protein:  55-70 grams  Fluid:  1.2-1.4 L    Gaynell Face, MS, RD, LDN Inpatient Clinical Dietitian Pager: [phone] Weekend/After Hours: [phone]

## 2019-05-11 DIAGNOSIS — J9621 Acute and chronic respiratory failure with hypoxia: Secondary | ICD-10-CM

## 2019-05-11 LAB — HEPATIC FUNCTION PANEL
ALT: 52 U/L — ABNORMAL HIGH (ref 0–44)
AST: 44 U/L — ABNORMAL HIGH (ref 15–41)
Albumin: 3.8 g/dL (ref 3.5–5.0)
Alkaline Phosphatase: 46 U/L (ref 38–126)
Bilirubin, Direct: 0.1 mg/dL (ref 0.0–0.2)
Indirect Bilirubin: 0.6 mg/dL (ref 0.3–0.9)
Total Bilirubin: 0.7 mg/dL (ref 0.3–1.2)
Total Protein: 7.6 g/dL (ref 6.5–8.1)

## 2019-05-11 LAB — CBC
HCT: 38.8 % (ref 36.0–46.0)
Hemoglobin: 12.2 g/dL (ref 12.0–15.0)
MCH: 30.7 pg (ref 26.0–34.0)
MCHC: 31.4 g/dL (ref 30.0–36.0)
MCV: 97.7 fL (ref 80.0–100.0)
Platelets: 153 10*3/uL (ref 150–400)
RBC: 3.97 MIL/uL (ref 3.87–5.11)
RDW: 14.3 % (ref 11.5–15.5)
WBC: 13 10*3/uL — ABNORMAL HIGH (ref 4.0–10.5)
nRBC: 0 % (ref 0.0–0.2)

## 2019-05-11 LAB — BASIC METABOLIC PANEL
Anion gap: 13 (ref 5–15)
BUN: 28 mg/dL — ABNORMAL HIGH (ref 8–23)
CO2: 26 mmol/L (ref 22–32)
Calcium: 9 mg/dL (ref 8.9–10.3)
Chloride: 103 mmol/L (ref 98–111)
Creatinine, Ser: 0.93 mg/dL (ref 0.44–1.00)
GFR calc Af Amer: >60 mL/min (ref >60)
GFR calc non Af Amer: 55 mL/min — ABNORMAL LOW (ref >60)
Glucose, Bld: 219 mg/dL — ABNORMAL HIGH (ref 70–99)
Potassium: 4.4 mmol/L (ref 3.5–5.1)
Sodium: 142 mmol/L (ref 135–145)

## 2019-05-11 LAB — GLUCOSE, CAPILLARY
Glucose-Capillary: 123 mg/dL — ABNORMAL HIGH (ref 70–99)
Glucose-Capillary: 266 mg/dL — ABNORMAL HIGH (ref 70–99)
Glucose-Capillary: 394 mg/dL — ABNORMAL HIGH (ref 70–99)
Glucose-Capillary: 203 mg/dL — ABNORMAL HIGH (ref 70–99)

## 2019-05-11 LAB — ECHOCARDIOGRAM COMPLETE
Height: 60 in
Weight: 1648 oz

## 2019-05-11 MED ORDER — CHLORHEXIDINE GLUCONATE CLOTH 2 % EX PADS
6.0000 | MEDICATED_PAD | Freq: Every day | CUTANEOUS | Status: DC
  Administered 2019-05-11 – 2019-05-15 (×6): 6 via TOPICAL

## 2019-05-11 MED ORDER — DILTIAZEM HCL 60 MG PO TABS
30.0000 mg | ORAL_TABLET | Freq: Four times a day (QID) | ORAL | Status: DC
  Administered 2019-05-11 – 2019-05-13 (×8): 30 mg via ORAL
  Filled 2019-05-11 (×8): qty 1

## 2019-05-11 MED ORDER — SENNA 8.6 MG PO TABS
1.0000 | ORAL_TABLET | Freq: Every day | ORAL | Status: DC
  Administered 2019-05-11 – 2019-05-15 (×5): 8.6 mg via ORAL
  Filled 2019-05-11 (×5): qty 1

## 2019-05-11 MED ORDER — IPRATROPIUM BROMIDE 0.02 % IN SOLN
0.5000 mg | Freq: Two times a day (BID) | RESPIRATORY_TRACT | Status: DC
  Administered 2019-05-11 – 2019-05-12 (×2): 0.5 mg via RESPIRATORY_TRACT
  Filled 2019-05-11 (×2): qty 2.5

## 2019-05-11 MED ORDER — TAMSULOSIN HCL 0.4 MG PO CAPS
0.4000 mg | ORAL_CAPSULE | Freq: Every day | ORAL | Status: DC
  Administered 2019-05-11 – 2019-05-15 (×5): 0.4 mg via ORAL
  Filled 2019-05-11 (×5): qty 1

## 2019-05-11 MED ORDER — PREDNISONE 20 MG PO TABS
40.0000 mg | ORAL_TABLET | Freq: Every day | ORAL | Status: DC
  Administered 2019-05-12 – 2019-05-15 (×4): 40 mg via ORAL
  Filled 2019-05-11 (×4): qty 2

## 2019-05-11 NOTE — Plan of Care (Signed)

## 2019-05-11 NOTE — Progress Notes (Signed)
PROGRESS NOTE    [name]  QMV:[id number] DOB: Jan 06, 1930 DOA: 05/09/2019 PCP: [name], MD   Brief Narrative: 86 year old with past medical history significant for hypertension, combined systolic and diastolic heart failure, A. 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  Patient wake up her daughter at 4:30 AM stating that she is not able to breath.  Shortness of breath did not improve with breathing treatments.  Patient presented to the ED for further evaluation. In ED patient was found to be in A. fib RVR, heart rate up to 160, she was tachypneic, chest x-ray showed mild cardiomegaly without signs of edema.  Patient received IV Solu-Medrol, IV Lasix and magnesium sulfate.   Assessment & Plan:   Principal Problem:   Acute on chronic respiratory failure with hypoxia (HCC) Active Problems:   Diabetes mellitus without complication, without long-term current use of insulin (HCC)   COPD with acute exacerbation (HCC)   Alzheimer disease (HCC)   Atrial fibrillation with RVR (HCC)   Combined systolic and diastolic cardiac dysfunction   Transaminitis  1-Acute om  Chronic hypoxic respiratory failure secondary to COPD exacerbation and A. fib with RVR: Treated  with IV Solu-Medrol, Brovana with desonide, albuterol. Pulmonology is consulted. Appreciate assistance.  Plan to start prednisone 1-17,  On Pulmicort, ipratropium, brovana.   2-Paroxysmal A. fib with RVR; patient presented with heart rate in the 160 RVR. On aspirin, not on anticoagulation due to high risk for fall. Plan to transition Cardizem gtt to oral.  Appreciate cardiology follow-up.  Echo Severe Mitral valve regurgitation, Ef 35 %, hypokinesis. Plan per cardiology. No candidate for mitral clip.  3-Leukocytosis: White blood cell trending  down.  Chest X ray  negative for pneumonia. Afebrile.  On Doxy for COPD exacerbation.   4-Combined systolic and diastolic congestive heart failure: BNP elevated 1267. Lasix on hold due to AKI.  Cardiology following.  5-Diabetes type 2: Hyperglycemia on admission. Continue with a sliding scale insulin. Added  low-dose Lantus  6-Hypertensive urgency: Improved on Coreg  7-Transaminases: Follow trend  8-Alzheimer's dementia: Mild per family. Continue with Namenda  AKI: Lasix on hold, improved with fluids.  Hold ACE.    Urine retention;  Started Flomax.  Foley catheter placed. She will need voiding trial.   Estimated body mass index is 18.99 kg/m as calculated from the following:   Height as of this encounter: 5' (1.524 m).   Weight as of this encounter: 44.1 kg.   DVT prophylaxis: Lovenox Code Status: Partial Code: No Chest compression, Yes to medication, Intubation, defibrillation..  Family Communication: Care discussed with Daughter who was at bedside 1-15 Disposition Plan: Remain in the hospital for treatment of resp failure Consultants:   Cardiology  Pulmonary   Procedures:   ECHO  Antimicrobials:    Subjective: She is feeling better, dyspnea has improved.  Had 500 cc urine retention.    Objective: Vitals:   05/11/19 0800 05/11/19 0851 05/11/19 0919 05/11/19 1136  BP: 132/67   138/62  Pulse:   100 96  Resp: 18   18  Temp: 98.2 F (36.8 C)   99.5 F (37.5 C)  TempSrc: Oral   Rectal  SpO2: 91% 97%  96%  Weight:      Height:        Intake/Output Summary (Last 24 hours) at 05/11/2019 1413 Last  data filed at 05/11/2019 1100 Gross per 24 hour  Intake 2318.66 ml  Output 550 ml  Net 1768.66 ml   Filed Weights   05/09/19 0610 05/10/19 0337 05/11/19 0426  Weight: 46.7 kg 40.4 kg 44.1 kg    Examination:  General exam: thin appearing. NAD Respiratory system: CTA no wheezing, using less accessory muscle to breath Cardiovascular system: S 1, S 2  RRR Gastrointestinal system: BS present, soft, nt Central nervous system: alert, follows command Extremities: no edema Skin: no rashes   Data Reviewed: I have personally reviewed following labs and imaging studies  CBC: Recent Labs  Lab 05/09/19 0627 05/10/19 0202 05/11/19 0916  WBC 14.7* 13.3* 13.0*  HGB 11.6* 11.4* 12.2  HCT 37.9 38.3 38.8  MCV 103.3* 102.4* 97.7  PLT 176 179 784   Basic Metabolic Panel: Recent Labs  Lab 05/09/19 0627 05/10/19 0202 05/10/19 0952 05/11/19 0916  NA 139 138 142 142  K 4.3 5.1 5.1 4.4  CL 100 98 104 103  CO2 23 23 24 26   GLUCOSE 341* 343* 125* 219*  BUN 16 29* 34* 28*  CREATININE 0.90 1.25* 1.57* 0.93  CALCIUM 9.0 8.8* 8.8* 9.0  MG  --  2.2  --   --    GFR: Estimated Creatinine Clearance: 29.7 mL/min (by C-G formula based on SCr of 0.93 mg/dL). Liver Function Tests: Recent Labs  Lab 05/09/19 0627 05/10/19 0202 05/11/19 0916  AST 52* 57* 44*  ALT 52* 53* 52*  ALKPHOS 45 44 46  BILITOT 1.0 0.7 0.7  PROT 7.3 7.4 7.6  ALBUMIN 4.0 4.1 3.8   No results for input(s): LIPASE, AMYLASE in the last 168 hours. No results for input(s): AMMONIA in the last 168 hours. Coagulation Profile: No results for input(s): INR, PROTIME in the last 168 hours. Cardiac Enzymes: No results for input(s): CKTOTAL, CKMB, CKMBINDEX, TROPONINI in the last 168 hours. BNP (last 3 results) No results for input(s): PROBNP in the last 8760 hours. HbA1C: Recent Labs    05/09/19 1443  HGBA1C 6.0*   CBG: Recent Labs  Lab 05/10/19 1006 05/10/19 1606 05/10/19 2127 05/11/19 0425 05/11/19 1000  GLUCAP 116* 169* 253* 123* 266*   Lipid Profile: No results for input(s): CHOL, HDL, LDLCALC, TRIG, CHOLHDL, LDLDIRECT in the last 72 hours. Thyroid Function Tests: Recent Labs    05/09/19 2141  TSH 1.429   Anemia Panel: No results for input(s): VITAMINB12, FOLATE, FERRITIN, TIBC, IRON, RETICCTPCT in the last 72 hours. Sepsis Labs: No results for  input(s): PROCALCITON, LATICACIDVEN in the last 168 hours.  Recent Results (from the past 240 hour(s))  SARS CORONAVIRUS 2 (TAT 6-24 HRS) Nasopharyngeal Nasopharyngeal Swab     Status: None   Collection Time: 05/09/19  6:56 AM   Specimen: Nasopharyngeal Swab  Result Value Ref Range Status   SARS Coronavirus 2 NEGATIVE NEGATIVE Final    Comment: (NOTE) SARS-CoV-2 target nucleic acids are NOT DETECTED. The SARS-CoV-2 RNA is generally detectable in upper and lower respiratory specimens during the acute phase of infection. Negative results do not preclude SARS-CoV-2 infection, do not rule out co-infections with other pathogens, and should not be used as the sole basis for treatment or other patient management decisions. Negative results must be combined with clinical observations, patient history, and epidemiological information. The expected result is Negative. Fact Sheet for Patients: SugarRoll.be Fact Sheet for Healthcare Providers: https://www.woods-mathews.com/ This test is not yet approved or cleared by the [place] FDA and  has been authorized for  detection and/or diagnosis of SARS-CoV-2 by FDA under an Emergency Use Authorization (EUA). This EUA will remain  in effect (meaning this test can be used) for the duration of the COVID-19 declaration under Section 56 4(b)(1) of the Act, 21 U.S.C. section 360bbb-3(b)(1), unless the authorization is terminated or revoked sooner. Performed at [facility], [address] [place], [place] [address]   MRSA PCR Screening     Status: None   Collection Time: 05/09/19 10:30 PM   Specimen: Nasopharyngeal  Result Value Ref Range Status   MRSA by PCR NEGATIVE NEGATIVE Final    Comment:        The GeneXpert MRSA Assay (FDA approved for NASAL specimens only), is one component of a comprehensive MRSA colonization surveillance program. It is not intended to diagnose MRSA infection nor to  guide or monitor treatment for MRSA infections. Performed at [facility], [address], [place], [place] [address]          Radiology Studies: No results found.      Scheduled Meds: . arformoterol  15 mcg Nebulization BID  . aspirin EC  81 mg Oral Daily  . atorvastatin  10 mg Oral q1800  . budesonide (PULMICORT) nebulizer solution  0.5 mg Nebulization BID  . carvedilol  6.25 mg Oral BID WC  . Chlorhexidine Gluconate Cloth  6 each Topical Daily  . digoxin  0.0625 mg Oral Daily  . diltiazem  30 mg Oral Q6H  . enoxaparin (LOVENOX) injection  40 mg Subcutaneous Daily  . feeding supplement (ENSURE ENLIVE)  237 mL Oral TID BM  . guaiFENesin  600 mg Oral BID  . insulin aspart  0-9 Units Subcutaneous Q6H  . insulin glargine  6 Units Subcutaneous Daily  . ipratropium  0.5 mg Nebulization BID  . memantine  10 mg Oral BID  . [START ON 05/12/2019] predniSONE  40 mg Oral Q breakfast  . senna  1 tablet Oral Daily  . sodium chloride flush  3 mL Intravenous Q12H  . sodium chloride flush  3 mL Intravenous Q12H  . tamsulosin  0.4 mg Oral Daily   Continuous Infusions: . sodium chloride    . doxycycline (VIBRAMYCIN) IV 125 mL/hr at 05/11/19 1100     LOS: 2 days    Time spent: 35 minutes    Vertie Dibbern A Kameren Pargas, MD [facility]   If 7PM-7AM, please contact night-coverage [email or web address] Password [facility] 05/11/2019, 2:13 PM

## 2019-05-11 NOTE — Evaluation (Signed)
Physical Therapy Evaluation Patient Details Name: [name] MRN: [id number] DOB: April 27, 1929 Today's Date: 05/11/2019   History of Present Illness  86 year old with past medical history significant for hypertension, combined systolic and diastolic heart failure, A. 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  Clinical Impression  Pt presents to PT with deficits in functional mobility, gait, balance, endurance, power, strength, cognition. Pt with strong posterior lean during standing which greatly increases her falls risk. Due to cognitive deficits and refusal of use of RW in past this patient is likely to mobilize without use of RW in the home setting as daughter reports pt has a tendency to wander some at home. Pt will benefit from further acute PT services to improve balance, functional mobility, and gait quality to reduce falls risk. Pt recommending CIR at this time as the pt has a flight of stairs to reach bedroom and has been unable to initiate significant gait training at this time due to balance impairments.    Follow Up Recommendations CIR;Supervision/Assistance - 24 hour;Home health PT(HHPT if pt denied CIR or if family declines CIR)    Equipment Recommendations  Wheelchair (measurements PT);Wheelchair cushion (measurements PT)(consult daughter prior to ordering, family may own W/C)    Recommendations for Other Services OT consult;Rehab consult     Precautions / Restrictions Precautions Precautions: Fall Restrictions Weight Bearing Restrictions: No      Mobility  Bed Mobility Overal bed mobility: Needs Assistance Bed Mobility: Supine to Sit     Supine to sit: Supervision        Transfers Overall transfer level: Needs assistance Equipment used: Rolling walker (2 wheeled) Transfers: Sit  to/from Stand Sit to Stand: Min assist         General transfer comment: pt with posterior lean  Ambulation/Gait Ambulation/Gait assistance: Min assist Gait Distance (Feet): 2 Feet Assistive device: Rolling walker (2 wheeled) Gait Pattern/deviations: Shuffle Gait velocity: reduced Gait velocity interpretation: <1.31 ft/sec, indicative of household ambulator General Gait Details: pt with shuffling steps from bed to recliner. PT providing verbal and tactile cues for RW management and transfer sequencing  Stairs            Wheelchair Mobility    Modified Rankin (Stroke Patients Only)       Balance Overall balance assessment: Needs assistance Sitting-balance support: No upper extremity supported;Feet supported Sitting balance-Leahy Scale: Good Sitting balance - Comments: supervision   Standing balance support: Bilateral upper extremity supported Standing balance-Leahy Scale: Poor Standing balance comment: min-modA with BUE support of RW, posterior lean                             Pertinent Vitals/Pain Pain Assessment: No/denies pain    Home Living Family/patient expects to be discharged to:: Private residence Living Arrangements: Children Available Help at Discharge: Family;Available 24 hours/day Type of Home: House Home Access: Stairs to enter Entrance Stairs-Rails: None Entrance Stairs-Number of Steps: 1 Home Layout: Two level Home Equipment: Walker - 2 wheels;Bedside commode      Prior Function Level of Independence: Needs assistance   Gait / Transfers Assistance Needed: independent household ambulator, refuses use of RW and holds onto walls or furniture  ADL's / Homemaking Assistance Needed: assist for all ADLs        Hand Dominance  Dominant Hand: Right    Extremity/Trunk Assessment   Upper Extremity Assessment Upper Extremity Assessment: Generalized weakness    Lower Extremity Assessment Lower Extremity Assessment:  Generalized weakness    Cervical / Trunk Assessment Cervical / Trunk Assessment: Kyphotic  Communication   Communication: HOH  Cognition Arousal/Alertness: Awake/alert Behavior During Therapy: WFL for tasks assessed/performed Overall Cognitive Status: History of cognitive impairments - at baseline                                 General Comments: pt cognition appears close to baseline per daughter, pt with dementia      General Comments General comments (skin integrity, edema, etc.): VSS on 4L [place]    Exercises     Assessment/Plan    PT Assessment Patient needs continued PT services  PT Problem List Decreased strength;Decreased activity tolerance;Decreased balance;Decreased mobility;Decreased cognition;Decreased knowledge of use of DME;Decreased safety awareness;Decreased knowledge of precautions       PT Treatment Interventions DME instruction;Gait training;Stair training;Functional mobility training;Therapeutic activities;Therapeutic exercise;Balance training;Neuromuscular re-education;Patient/family education    PT Goals (Current goals can be found in the Care Plan section)  Acute Rehab PT Goals Patient Stated Goal: To improve mobility and return home PT Goal Formulation: With patient/family Time For Goal Achievement: 05/25/19 Potential to Achieve Goals: Fair    Frequency Min 3X/week   Barriers to discharge Inaccessible home environment(flight of stairs to bedroom)      Co-evaluation               AM-PAC PT "6 Clicks" Mobility  Outcome Measure Help needed turning from your back to your side while in a flat bed without using bedrails?: None Help needed moving from lying on your back to sitting on the side of a flat bed without using bedrails?: None Help needed moving to and from a bed to a chair (including a wheelchair)?: A Lot Help needed standing up from a chair using your arms (e.g., wheelchair or bedside chair)?: A Lot Help needed to walk in  hospital room?: A Lot Help needed climbing 3-5 steps with a railing? : Total 6 Click Score: 15    End of Session Equipment Utilized During Treatment: Oxygen Activity Tolerance: Patient tolerated treatment well Patient left: in chair;with call bell/phone within reach;with family/visitor present Nurse Communication: Mobility status PT Visit Diagnosis: Unsteadiness on feet (R26.81);Muscle weakness (generalized) (M62.81)    Time: 8185-6314 PT Time Calculation (min) (ACUTE ONLY): 43 min   Charges:   PT Evaluation $PT Eval Moderate Complexity: 1 Mod PT Treatments $Therapeutic Activity: 8-22 mins        Zenaida Niece, PT, DPT Acute Rehabilitation Pager: [phone]   Zenaida Niece 05/11/2019, 6:38 PM

## 2019-05-11 NOTE — Progress Notes (Signed)
Subjective:  Weak but otherwise doing better Eating breakfast   Objective:  Vitals:   05/10/19 1944 05/10/19 2006 05/10/19 2316 05/11/19 0426  BP: 138/62  115/61 133/77  Pulse: (!) 104  91 97  Resp: (!) 25 14 14 20   Temp: 98.1 F (36.7 C)  98 F (36.7 C) (!) 97.4 F (36.3 C)  TempSrc: Oral  Oral Oral  SpO2: 100%  99% 99%  Weight:    44.1 kg  Height:        Intake/Output from previous day:  Intake/Output Summary (Last 24 hours) at 05/11/2019 0749 Last data filed at 05/11/2019 0700 Gross per 24 hour  Intake 1537.66 ml  Output --  Net 1537.66 ml    Physical Exam: Affect appropriate Frail elderly black female  HEENT: normal Neck supple with no adenopathy JVP elevated  no bruits no thyromegaly Lungs clear with no wheezing and good diaphragmatic motion Heart:  S1/S2 MR  murmur, no rub, gallop or click PMI normal Abdomen: benighn, BS positve, no tenderness, no AAA no bruit.  No HSM or HJR Distal pulses intact with no bruits No edema Neuro non-focal Skin warm and dry No muscular weakness Bear Hugger warming patient    Lab Results: Basic Metabolic Panel: Recent Labs    05/10/19 0202 05/10/19 0952  NA 138 142  K 5.1 5.1  CL 98 104  CO2 23 24  GLUCOSE 343* 125*  BUN 29* 34*  CREATININE 1.25* 1.57*  CALCIUM 8.8* 8.8*  MG 2.2  --    Liver Function Tests: Recent Labs    05/09/19 0627 05/10/19 0202  AST 52* 57*  ALT 52* 53*  ALKPHOS 45 44  BILITOT 1.0 0.7  PROT 7.3 7.4  ALBUMIN 4.0 4.1   No results for input(s): LIPASE, AMYLASE in the last 72 hours. CBC: Recent Labs    05/09/19 0627 05/10/19 0202  WBC 14.7* 13.3*  HGB 11.6* 11.4*  HCT 37.9 38.3  MCV 103.3* 102.4*  PLT 176 179    Recent Labs    05/09/19 1443  HGBA1C 6.0*   Fasting Lipid Panel: No results for input(s): CHOL, HDL, LDLCALC, TRIG, CHOLHDL, LDLDIRECT in the last 72 hours. Thyroid Function Tests: Recent Labs    05/09/19 2141  TSH 1.429   Anemia Panel: No results for  input(s): VITAMINB12, FOLATE, FERRITIN, TIBC, IRON, RETICCTPCT in the last 72 hours.  Imaging: No results found.  Cardiac Studies:  ECG: afib rate 161 chronic RBBB    Telemetry: NSR rates 90-100  Echo:  Prelim bedside review personally EF 40% severe MR   Medications:   . arformoterol  15 mcg Nebulization BID  . aspirin EC  81 mg Oral Daily  . atorvastatin  10 mg Oral q1800  . budesonide (PULMICORT) nebulizer solution  0.5 mg Nebulization BID  . carvedilol  6.25 mg Oral BID WC  . digoxin  0.0625 mg Oral Daily  . enoxaparin (LOVENOX) injection  40 mg Subcutaneous Daily  . feeding supplement (ENSURE ENLIVE)  237 mL Oral TID BM  . guaiFENesin  600 mg Oral BID  . insulin aspart  0-9 Units Subcutaneous Q6H  . insulin glargine  6 Units Subcutaneous Daily  . ipratropium  0.5 mg Nebulization QID  . memantine  10 mg Oral BID  . methylPREDNISolone (SOLU-MEDROL) injection  40 mg Intravenous Q12H  . sodium chloride flush  3 mL Intravenous Q12H  . sodium chloride flush  3 mL Intravenous Q12H     . sodium  chloride    . diltiazem (CARDIZEM) infusion Stopped (05/10/19 0009)  . doxycycline (VIBRAMYCIN) IV 100 mg (05/11/19 0103)  . lactated ringers 75 mL/hr at 05/11/19 0700    Assessment/Plan:  IZELA ALTIER is a 86 y.o. female with a hx of systolic and diastolic CHF, HTN, DM2, COPD on home O2 (follows with pulmonary medicine) with ongoing tobacco abuse and atrial fibrillation not on anticoagulation who is being seen today for the evaluation of at the request of Dr. Tamala Julian.   1. Afib : converted to NSR continue oral digoxin and coreg. Change to oral cardizem  Rate was > 160 in ER initially now NSR  Dig level was low She has not been anticoagulated in past due to extreme low body weight and fall risk   2. CHF:  Bedside review of echo shows severe MR which is new since 2018 and similar EF 40% range Given age and weight not a candidate for mitral clip. Continue iv lasix coreg , digoxin and  lotensin Hopefully degree of MR will be less as her COPD improves and now that she is not in rapid afib Not sure why echo not officially read but will do latter today   3. COPD:  On home oxygen quit smoking 2 years ago significant wheezing on exam steroids and nebs per primary service WBC is coming down on Rocephin   I spoke with her daughter Donnald Garre and she indicates her mother and her discussed being intubated but not being shocked or having her Heart restarted if she were to have arrhythmia or asystole Have noted partial code in chart   Jenkins Rouge 05/11/2019, 7:49 AM

## 2019-05-11 NOTE — Plan of Care (Signed)

## 2019-05-11 NOTE — Progress Notes (Signed)
NAME:  [name], MRN:  [id number], DOB:  July 19, 1929, LOS: 2 ADMISSION DATE:  05/09/2019, CONSULTATION DATE:  05/10/19 REFERRING MD:  Hartley Barefoot CHIEF COMPLAINT:  SOB   Brief History   52 year woman with hx of HFPEF, COPD on HOT p/w SOB, palpitations.   History of present illness   39 year woman with hx of HFPEF, COPD on HOT p/w SOB, palpitations.   FEV1 in 2017 was 0.66L (60% pred).  Found to be in afib/RVR controlled with digoxin and CCB.  Had some nausea this AM so not placed on BIPAP.  Now no nausea.  She has had anorexia for 2 days.  Her CXR is benign.  UA ordered but not collected.  Diuresis attempted but has resulted in worsening renal function.  Bedside US by cardiology service shows severe mitral valve regurgitation, formal echo pending.  Past Medical History   Past Medical History:  Diagnosis Date  . Acute bronchitis   . Acute on chronic respiratory failure with hypoxia (HCC)   . Alzheimer disease (HCC) 06/04/2018  . Benign hypertensive renal disease   . CAD (coronary artery disease)   . Cardiomyopathy    non-ischemic 04/2008 EF 45% cardiac MRI no scar  . CHF (congestive heart failure) (HCC)   . Chronic sinusitis   . COPD (chronic obstructive pulmonary disease) (HCC)   . COPD exacerbation (HCC)   . Depression   . Diabetes mellitus without complication, without long-term current use of insulin (HCC)   . Dyslipidemia associated with type 2 diabetes mellitus (HCC)   . Dyspnea on exertion    chronic  . Heart disease   . Hypercholesterolemia   . Hypertension   . On home oxygen therapy    "3L; 24/7; for the last 2 1/2 weeks; none before that" (07/26/2016)  . Protein-calorie malnutrition, severe (HCC)   . Sinus headache    "chronic"  . Stroke [facility]) 1997   "mild"  . Type II diabetes mellitus (HCC)      Significant Hospital Events   1/14 admitted  Consults:  Cardiology, PCCM  Procedures:  N/A  Significant Diagnostic Tests:  CXR stable  hyperinflation  Micro Data:  COVID neg  Antimicrobials:  Ceftriaxone 1/15 >> 1/16 Doxycycline 1/15>>  Interim history/subjective:  No acute events overnight. Remains on 3lpm O2. No distress. Pleasant, no complaints this am.   Objective   Blood pressure 133/77, pulse 97, temperature (!) 97.4 F (36.3 C), temperature source Oral, resp. rate 20, height 5' (1.524 m), weight 44.1 kg, SpO2 99 %.        Intake/Output Summary (Last 24 hours) at 05/11/2019 0733 Last data filed at 05/10/2019 1700 Gross per 24 hour  Intake 410.18 ml  Output --  Net 410.18 ml   Filed Weights   05/09/19 0610 05/10/19 0337 05/11/19 0426  Weight: 46.7 kg 40.4 kg 44.1 kg    Examination: General: Cachectic elderly female. NAD HENT: Temporal wasting. Normocephalic, PERRL. Moist mucus membranes Neck: No JVD. Trachea midline.  CV: RRR. S1S2. No MRG. +2 distal pulses Lungs: BBS diminished but clear, FNL, symmetrical ABD: +BS x4. SNT/ND. No masses, guarding or rigidity GU: Foley being placed by staff  EXT: MAE well. No edema. Global atrophy and muscle wasting Skin: PWD. In tact. No rashes or lesions Neuro: A&Ox2. No focal deficits    Resolved Hospital Problem list   N/A  Assessment & Plan:  # Oxygen dependent COPD- presumed in flare # Nausea/vomiting-  unclear etiology, moving bowels, abd  exam benign. Improved  # AKI- worsened with diuresis. Foley being placed # Atrial fibrillation with rapid ventricular response- question of severe mitral insufficiency on bedside ultrasound by cardiology, formal echo results still pending # Dementia # Frail elderly- approaching end-of-life  - continue current steroids and decrease tomorrow  - continue brovana/ipratropium/budesonide - UA normal. D/C ceftriaxone  - continue doxycycline atypical coverage - The patient's outpatient pulmonologist has already had [place] discussions with family, they are not ready at this time to make her DNI. -f/u with daughter regarding  clear GoC. Palliative care consulted - continue to hold on BIPAP, d/t intermittent N/V and is not aware of when it comes on due to her memory issues. Also seems to be doing well and comfortable with current supplemental O2 @3lpm .      PCCM will see again 1/17.  Labs   CBC: Recent Labs  Lab 05/09/19 0627 05/10/19 0202  WBC 14.7* 13.3*  HGB 11.6* 11.4*  HCT 37.9 38.3  MCV 103.3* 102.4*  PLT 176 622    Basic Metabolic Panel: Recent Labs  Lab 05/09/19 0627 05/10/19 0202 05/10/19 0952  NA 139 138 142  K 4.3 5.1 5.1  CL 100 98 104  CO2 23 23 24   GLUCOSE 341* 343* 125*  BUN 16 29* 34*  CREATININE 0.90 1.25* 1.57*  CALCIUM 9.0 8.8* 8.8*  MG  --  2.2  --    GFR: Estimated Creatinine Clearance: 17.6 mL/min (A) (by C-G formula based on SCr of 1.57 mg/dL (H)). Recent Labs  Lab 05/09/19 0627 05/10/19 0202  WBC 14.7* 13.3*    Liver Function Tests: Recent Labs  Lab 05/09/19 0627 05/10/19 0202  AST 52* 57*  ALT 52* 53*  ALKPHOS 45 44  BILITOT 1.0 0.7  PROT 7.3 7.4  ALBUMIN 4.0 4.1     CBG: Recent Labs  Lab 05/10/19 0716 05/10/19 1006 05/10/19 1606 05/10/19 2127 05/11/19 0425  GLUCAP 147* 116* 169* 253* 123*   Francine Graven, MSN, AGACNP   Pulmonary & Critical Care

## 2019-05-11 NOTE — Progress Notes (Signed)
Patient is currently on 3L  with stable vital signs. Patient tolerated breathing treatments well and breath sounds are clear, diminished. No respiratory distress noted. BIPAP not needed at this time. RT will monitor as needed.

## 2019-05-12 DIAGNOSIS — Z7181 Spiritual or religious counseling: Secondary | ICD-10-CM

## 2019-05-12 DIAGNOSIS — Z66 Do not resuscitate: Secondary | ICD-10-CM

## 2019-05-12 DIAGNOSIS — Z515 Encounter for palliative care: Secondary | ICD-10-CM

## 2019-05-12 DIAGNOSIS — R410 Disorientation, unspecified: Secondary | ICD-10-CM

## 2019-05-12 DIAGNOSIS — M6281 Muscle weakness (generalized): Secondary | ICD-10-CM

## 2019-05-12 LAB — CBC
HCT: 32.3 % — ABNORMAL LOW (ref 36.0–46.0)
Hemoglobin: 10.1 g/dL — ABNORMAL LOW (ref 12.0–15.0)
MCH: 30.6 pg (ref 26.0–34.0)
MCHC: 31.3 g/dL (ref 30.0–36.0)
MCV: 97.9 fL (ref 80.0–100.0)
Platelets: 134 10*3/uL — ABNORMAL LOW (ref 150–400)
RBC: 3.3 MIL/uL — ABNORMAL LOW (ref 3.87–5.11)
RDW: 14.2 % (ref 11.5–15.5)
WBC: 11.7 10*3/uL — ABNORMAL HIGH (ref 4.0–10.5)
nRBC: 0 % (ref 0.0–0.2)

## 2019-05-12 LAB — BASIC METABOLIC PANEL
Anion gap: 9 (ref 5–15)
BUN: 31 mg/dL — ABNORMAL HIGH (ref 8–23)
CO2: 27 mmol/L (ref 22–32)
Calcium: 8.7 mg/dL — ABNORMAL LOW (ref 8.9–10.3)
Chloride: 102 mmol/L (ref 98–111)
Creatinine, Ser: 0.69 mg/dL (ref 0.44–1.00)
GFR calc Af Amer: >60 mL/min (ref >60)
GFR calc non Af Amer: >60 mL/min (ref >60)
Glucose, Bld: 199 mg/dL — ABNORMAL HIGH (ref 70–99)
Potassium: 4.5 mmol/L (ref 3.5–5.1)
Sodium: 138 mmol/L (ref 135–145)

## 2019-05-12 LAB — GLUCOSE, CAPILLARY
Glucose-Capillary: 162 mg/dL — ABNORMAL HIGH (ref 70–99)
Glucose-Capillary: 357 mg/dL — ABNORMAL HIGH (ref 70–99)
Glucose-Capillary: 257 mg/dL — ABNORMAL HIGH (ref 70–99)

## 2019-05-12 MED ORDER — NYSTATIN 100000 UNIT/ML MT SUSP
5.0000 mL | Freq: Four times a day (QID) | OROMUCOSAL | Status: DC
  Administered 2019-05-12 – 2019-05-15 (×11): 500000 [IU] via ORAL
  Filled 2019-05-12 (×11): qty 5

## 2019-05-12 NOTE — Plan of Care (Signed)

## 2019-05-12 NOTE — Progress Notes (Signed)
Subjective:  Ate breakfast contiues to improve her breathing   Objective:  Vitals:   05/12/19 0700 05/12/19 0754 05/12/19 0756 05/12/19 0757  BP:   126/71   Pulse: 71  70   Resp: 18  (!) 23   Temp:  97.9 F (36.6 C)    TempSrc:  Oral    SpO2:    100%  Weight:      Height:        Intake/Output from previous day:  Intake/Output Summary (Last 24 hours) at 05/12/2019 0802 Last data filed at 05/12/2019 1062 Gross per 24 hour  Intake 2962.21 ml  Output 1100 ml  Net 1862.21 ml    Physical Exam: Affect appropriate Frail elderly black female  HEENT: normal Neck supple with no adenopathy JVP elevated  no bruits no thyromegaly Lungs clear with no wheezing and good diaphragmatic motion Heart:  S1/S2 MR  murmur, no rub, gallop or click PMI normal Abdomen: benighn, BS positve, no tenderness, no AAA no bruit.  No HSM or HJR Distal pulses intact with no bruits No edema Neuro non-focal Skin warm and dry No muscular weakness Bear Hugger warming patient    Lab Results: Basic Metabolic Panel: Recent Labs    05/10/19 0202 05/10/19 0952 05/11/19 0916 05/12/19 0219  NA 138   < > 142 138  K 5.1   < > 4.4 4.5  CL 98   < > 103 102  CO2 23   < > 26 27  GLUCOSE 343*   < > 219* 199*  BUN 29*   < > 28* 31*  CREATININE 1.25*   < > 0.93 0.69  CALCIUM 8.8*   < > 9.0 8.7*  MG 2.2  --   --   --    < > = values in this interval not displayed.   Liver Function Tests: Recent Labs    05/10/19 0202 05/11/19 0916  AST 57* 44*  ALT 53* 52*  ALKPHOS 44 46  BILITOT 0.7 0.7  PROT 7.4 7.6  ALBUMIN 4.1 3.8   No results for input(s): LIPASE, AMYLASE in the last 72 hours. CBC: Recent Labs    05/11/19 0916 05/12/19 0219  WBC 13.0* 11.7*  HGB 12.2 10.1*  HCT 38.8 32.3*  MCV 97.7 97.9  PLT 153 134*    Recent Labs    05/09/19 1443  HGBA1C 6.0*   Fasting Lipid Panel: No results for input(s): CHOL, HDL, LDLCALC, TRIG, CHOLHDL, LDLDIRECT in the last 72 hours. Thyroid  Function Tests: Recent Labs    05/09/19 2141  TSH 1.429   Anemia Panel: No results for input(s): VITAMINB12, FOLATE, FERRITIN, TIBC, IRON, RETICCTPCT in the last 72 hours.  Imaging: No results found.  Cardiac Studies:  ECG: afib rate 161 chronic RBBB    Telemetry: NSR rates 90-100  Echo:  Prelim bedside review personally EF 40% severe MR   Medications:   . arformoterol  15 mcg Nebulization BID  . aspirin EC  81 mg Oral Daily  . atorvastatin  10 mg Oral q1800  . budesonide (PULMICORT) nebulizer solution  0.5 mg Nebulization BID  . carvedilol  6.25 mg Oral BID WC  . Chlorhexidine Gluconate Cloth  6 each Topical Daily  . digoxin  0.0625 mg Oral Daily  . diltiazem  30 mg Oral Q6H  . enoxaparin (LOVENOX) injection  40 mg Subcutaneous Daily  . feeding supplement (ENSURE ENLIVE)  237 mL Oral TID BM  . guaiFENesin  600 mg Oral  BID  . insulin aspart  0-9 Units Subcutaneous Q6H  . insulin glargine  6 Units Subcutaneous Daily  . ipratropium  0.5 mg Nebulization BID  . memantine  10 mg Oral BID  . predniSONE  40 mg Oral Q breakfast  . senna  1 tablet Oral Daily  . sodium chloride flush  3 mL Intravenous Q12H  . sodium chloride flush  3 mL Intravenous Q12H  . tamsulosin  0.4 mg Oral Daily     . sodium chloride    . doxycycline (VIBRAMYCIN) IV 100 mg (05/12/19 0131)    Assessment/Plan:  [name] is a 86 y.o. female with a hx of systolic and diastolic CHF, HTN, DM2, COPD on home O2 (follows with pulmonary medicine Dr Valeta Harms ) with ongoing tobacco abuse and atrial fibrillation not on anticoagulation who is being seen for the evaluation of PAF and CHF     1. Afib : converted to NSR continue oral digoxin coreg and cardizem  Rate was > 160 in ER initially now NSR  Dig level was low She has not been anticoagulated in past due to extreme low body weight and fall risk   2. CHF:  Echo shows severe MR which is new since 2018 and similar EF 40% range Given age and weight not a  candidate for mitral clip. Diuretics held due to azotemia Continue  digoxin and lotensin Hopefully degree of MR will be less as her COPD improves and now that she is not in rapid afib will repeat echo in 6 weeks in office   3. COPD:  On home oxygen quit smoking 2 years ago now no wheezing  on exam steroids and nebs per primary service WBC is coming down on Vibramycin now   She is limited code no shocking/defibrillation She appears dry on exam would continue to hold diuretics today  [name] 05/12/2019, 8:02 AM

## 2019-05-12 NOTE — Progress Notes (Signed)
Rehab Admissions Coordinator Note:  Per PT recommendation, this patient was screened by Cheri Rous for appropriateness for an Inpatient Acute Rehab Consult.  At this time, we are recommending an Inpatient Rehab consult.  AC will place consult order to allow for further follow up and assessment of pt's candidacy for possible CIR admission.   Cheri Rous 05/12/2019, 9:44 AM  I can be reached at [phone].

## 2019-05-12 NOTE — Consult Note (Signed)
Consultation Note Date: 05/12/2019   Patient Name: [name]  DOB: 1929/06/24  MRN: [id number]  Age / Sex: 86 y.o., female  PCP: Docia Chuck, Dibas, MD Referring Physician: Alba Cory, MD  Reason for Consultation: Establishing goals of care  HPI/Patient Profile:  Per intake H&P --> [name] is a 86 y.o. female with medical history significant of hypertension, combined systolic and diastolic CHF,  atrial fibrillation not on anticoagulation, COPD/emphysema, oxygen dependent of 2 L oxygen, diabetes mellitus type 2 diet controlled, remote history of tobacco use, and dementia.  She presents with complaints of acute onset of shortness of breath.  Patient reported that she had been feeling short of breath since last night, but she did not say anything to her daughter.  However, she woke up her daughter at 4:30 AM this morning stating she was unable to breathe.  Patient lives at home with the daughter who manages her medications.  Her daughter had tried giving her a breathing treatment without any improvement in symptoms prior to calling EMS.  She is followed by [facility] pulmonology in the outpatient setting.  She had not been on steroids recently.  Denies having any fever, chills, chest pain, nausea, vomiting, diarrhea, falls, or dysuria symptoms.  Clinical Assessment and Goals of Care: Chart reviewed. Touched base with bedside RN, Okey Regal. She said that intermittently patient was getting quite confused in the afternoon to evening hours.  Went in to assess patient. [name] was of clear mind shared with me her name, date of birth, where we are presently and why she came in. Introduced Palliative care service and what it is we do in the hospital. Asked [name] to tell me a little bit about herself. She shared with me that she is from [place]. She has six children some who live here and some who live  in [place]. She was married, but got a divorce in her younger years. He ex husband is now deceased. She stated that she never felt the need to remarry. She present lives with her daughter, [name] here in [place]. Prior to hospitalization she was able to walk (furniture walk), groom herself, feed herself, received some help with medications. She is "extremely independent" and does not like to rely on other people. She feels that her children have all grown up into strong independent people though she realizes that two of them still very much need her.  Introduced the topic of code status. She and I discussed what a code looks like and how CPR can be detrimental to someone of her age with the brittleness of her bones. She said that she would not want CPR and when "it's her time it's her time."  We discussed intubation and attachment to a mechanical ventilator, she very quickly endorsed that she would not want that. We talked about tube feedings and the benefits and risks associated with them which she shared she also would not want. Debra Aulakh would want to come to the hospital if something could be treated  and would also want antibiotics and IV fluids if they would help her. Offered outpatient palliative care services which she was agreeable to.  Called patients daughter, [name] this morning. We discussed the conversation I had had with her mother. She agreed to everything that her mother wishes in terms of code status. We talked about the possibility of CIR rehabilitation. [name] shared that she is worried her mother will get more confused if she is moved to another location and she will get to a point where she does not participate in therapy. She has a cousin that use to work at [employer] that she is hopeful to speak with regarding this possible transition. Alternatively [name] would take her mother home with PT/OT in the home environment. I shared that if she has the opportunity to go to CIR that perhaps  she should take it and if it's not working out well go from there. [name] is also agreeable to OP Palliative care following up.  Decision Maker: [name] (Daughter) [phone]  SUMMARY OF RECOMMENDATIONS   DNAR/DNI, no feeding tubes TOC --> OP Palliative follow up Chaplain Consult  Code Status/Advance Care Planning:  DNR  Palliative Management:  Delirium:                 - Delirium precautions                 - Get up during the day                 - Encourage a familiar face to remain present throughout the day                 - Keep blinds open and lights on during daylight hours                 - Minimize the use of opioids/benzodiazepines  Muscular Weakness:                 - PT evaluation                 - OT evaluation  - OOB daily for meals    Spiritual:                 - Chaplain consult   Palliative Prophylaxis:   Aspiration, Bowel Regimen, Delirium Protocol, Eye Care, Frequent Pain Assessment, Oral Care and Turn Reposition  Additional Recommendations (Limitations, Scope, Preferences):  Full Scope Treatment  Psycho-social/Spiritual:   Desire for further Chaplaincy support:  es  Additional Recommendations: Caregiving  Support/Resources  Prognosis:   Unable to determine  Discharge Planning:Possible transition to CIR versus home with [facility], daughter is still deciding. Will have outpatient Palliative follow up.     Primary Diagnoses: Present on Admission: . Atrial fibrillation with RVR (HCC) . Acute on chronic respiratory failure with hypoxia (HCC) . COPD with acute exacerbation (HCC) . Alzheimer disease (HCC) . Transaminitis  I have reviewed the medical record, interviewed the patient and family, and examined the patient. The following aspects are pertinent.  Past Medical History:  Diagnosis Date  . Acute bronchitis   . Acute on chronic respiratory failure with hypoxia (HCC)   . Alzheimer disease (HCC) 06/04/2018  . Benign hypertensive renal  disease   . CAD (coronary artery disease)   . Cardiomyopathy    non-ischemic 04/2008 EF 45% cardiac MRI no scar  . CHF (congestive heart failure) (HCC)   . Chronic sinusitis   . COPD (chronic obstructive pulmonary  disease) (HCC)   . COPD exacerbation (HCC)   . Depression   . Diabetes mellitus without complication, without long-term current use of insulin (HCC)   . Dyslipidemia associated with type 2 diabetes mellitus (HCC)   . Dyspnea on exertion    chronic  . Heart disease   . Hypercholesterolemia   . Hypertension   . On home oxygen therapy    "3L; 24/7; for the last 2 1/2 weeks; none before that" (07/26/2016)  . Protein-calorie malnutrition, severe (HCC)   . Sinus headache    "chronic"  . Stroke [facility]) 1997   "mild"  . Type II diabetes mellitus (HCC)    Social History   Socioeconomic History  . Marital status: Divorced    Spouse name: Not on file  . Number of children: 1  . Years of education: Not on file  . Highest education level: Not on file  Occupational History  . Occupation: retired    Associate Professor: RETIRED  Tobacco Use  . Smoking status: Former Smoker    Packs/day: 0.75    Years: 70.00    Pack years: 52.50    Types: Cigarettes    Start date: 12/13/1948    Quit date: 07/26/2016    Years since quitting: 2.7  . Smokeless tobacco: Never Used  Substance and Sexual Activity  . Alcohol use: No    Alcohol/week: 0.0 standard drinks  . Drug use: No  . Sexual activity: Never  Other Topics Concern  . Not on file  Social History Narrative   Right handed    Caffeine 1 cup per day    Lives at home with daughter    Social Determinants of Health   Financial Resource Strain:   . Difficulty of Paying Living Expenses: Not on file  Food Insecurity:   . Worried About Programme researcher, broadcasting/film/video in the Last Year: Not on file  . Ran Out of Food in the Last Year: Not on file  Transportation Needs:   . Lack of Transportation (Medical): Not on file  . Lack of Transportation  (Non-Medical): Not on file  Physical Activity:   . Days of Exercise per Week: Not on file  . Minutes of Exercise per Session: Not on file  Stress:   . Feeling of Stress : Not on file  Social Connections:   . Frequency of Communication with Friends and Family: Not on file  . Frequency of Social Gatherings with Friends and Family: Not on file  . Attends Religious Services: Not on file  . Active Member of Clubs or Organizations: Not on file  . Attends Banker Meetings: Not on file  . Marital Status: Not on file   Family History  Problem Relation Age of Onset  . Heart disease Father   . Alcohol abuse Sister   . Alcohol abuse Brother   . Coronary artery disease Other        family history   Scheduled Meds: . arformoterol  15 mcg Nebulization BID  . aspirin EC  81 mg Oral Daily  . atorvastatin  10 mg Oral q1800  . budesonide (PULMICORT) nebulizer solution  0.5 mg Nebulization BID  . carvedilol  6.25 mg Oral BID WC  . Chlorhexidine Gluconate Cloth  6 each Topical Daily  . digoxin  0.0625 mg Oral Daily  . diltiazem  30 mg Oral Q6H  . enoxaparin (LOVENOX) injection  40 mg Subcutaneous Daily  . feeding supplement (ENSURE ENLIVE)  237 mL Oral TID BM  .  guaiFENesin  600 mg Oral BID  . insulin aspart  0-9 Units Subcutaneous Q6H  . insulin glargine  6 Units Subcutaneous Daily  . ipratropium  0.5 mg Nebulization BID  . memantine  10 mg Oral BID  . predniSONE  40 mg Oral Q breakfast  . senna  1 tablet Oral Daily  . sodium chloride flush  3 mL Intravenous Q12H  . sodium chloride flush  3 mL Intravenous Q12H  . tamsulosin  0.4 mg Oral Daily   Continuous Infusions: . sodium chloride    . doxycycline (VIBRAMYCIN) IV 100 mg (05/12/19 0131)   PRN Meds:.sodium chloride, acetaminophen **OR** acetaminophen, fluticasone, ondansetron (ZOFRAN) IV, sodium chloride flush Medications Prior to Admission:  Prior to Admission medications   Medication Sig Start Date End Date Taking?  Authorizing Provider  albuterol (PROVENTIL) (2.5 MG/3ML) 0.083% nebulizer solution Take 3 mLs (2.5 mg total) by nebulization every 4 (four) hours as needed for wheezing or shortness of breath. 05/04/18  Yes Icard, Bradley L, DO  albuterol (VENTOLIN HFA) 108 (90 Base) MCG/ACT inhaler Inhale 2 puffs into the lungs every 6 (six) hours as needed for wheezing or shortness of breath. 03/29/19 05/09/19 Yes Icard, Octavio Graves, DO  aspirin EC 81 MG tablet Take 81 mg by mouth daily.   Yes [provider]  atorvastatin (LIPITOR) 10 MG tablet TAKE 1 TABLET(10 MG) BY MOUTH DAILY AT 6 PM Patient taking differently: Take 10 mg by mouth daily at 6 PM.  01/25/19  Yes Josue Hector, MD  benazepril (LOTENSIN) 10 MG tablet Take 10 mg by mouth 2 (two) times daily.    Yes [provider]  budesonide-formoterol (SYMBICORT) 160-4.5 MCG/ACT inhaler INHALE 2 PUFFS INTO THE LUNGS TWICE DAILY 09/06/18  Yes Icard, Bradley L, DO  carvedilol (COREG) 6.25 MG tablet Take 1 tablet (6.25 mg total) by mouth 2 (two) times daily with a meal. 05/24/16  Yes Burtis Junes, NP  digoxin (LANOXIN) 0.125 MG tablet TAKE 1/2 TABLET BY MOUTH EVERY DAY. PLEASE KEEP APPT IN SEPT FOR FUTURE REFILLS Patient taking differently: Take 0.0625 mg by mouth daily.  01/23/19  Yes Josue Hector, MD  feeding supplement (BOOST HIGH PROTEIN) LIQD Take 1 Container by mouth as needed (between meal).   Yes [provider]  ferrous sulfate 325 (65 FE) MG tablet Take 325 mg by mouth daily with breakfast.   Yes [provider]  fluticasone (FLONASE) 50 MCG/ACT nasal spray Place 1 spray into both nostrils daily as needed for allergies. 06/09/16  Yes [provider]  furosemide (LASIX) 20 MG tablet TAKE 1 TABLET BY MOUTH ONCE DAILY Patient taking differently: Take 20 mg by mouth daily.  01/25/19  Yes Josue Hector, MD  insulin aspart (NOVOLOG) 100 UNIT/ML injection Inject into the skin daily as needed for high blood sugar (For  CBG >300). As needed   Yes [provider]  Lactobacillus (PROBIOTIC ACIDOPHILUS PO) Take 1 capsule by mouth daily.   Yes [provider]  memantine (NAMENDA) 10 MG tablet TAKE 1 TABLET(10 MG) BY MOUTH TWICE DAILY Patient taking differently: Take 10 mg by mouth 2 (two) times daily.  04/24/19  Yes Kathrynn Ducking, MD  metFORMIN (GLUCOPHAGE-XR) 500 MG 24 hr tablet Take 1,000 mg by mouth 2 (two) times daily.  03/13/15  Yes [provider]  Multiple Vitamin (MV-ONE PO) Take 1 tablet by mouth daily.   Yes [provider]  Spacer/Aero-Holding Chambers (AEROCHAMBER MV) inhaler Use as instructed  02/21/17  Yes Michele Mcalpine, MD  Tiotropium Bromide Monohydrate (SPIRIVA RESPIMAT) 2.5 MCG/ACT AERS Inhale 2 puffs into the lungs daily. 05/24/18  Yes Icard, Rachel Bo, DO  vitamin B-12 (CYANOCOBALAMIN) 500 MCG tablet Take 1,000 mcg by mouth daily.    Yes [provider]  INCRUSE ELLIPTA 62.5 MCG/INH AEPB INHALE 1 PUFF INTO THE LUNGS DAILY Patient not taking: Reported on 05/09/2019 10/05/17   Michele Mcalpine, MD   No Known Allergies Review of Systems  Constitutional: Positive for activity change.  Genitourinary: Positive for dysuria.   Physical Exam Vitals and nursing note reviewed.  HENT:     Head: Normocephalic.     Mouth/Throat:     Mouth: Mucous membranes are moist.  Eyes:     Pupils: Pupils are equal, round, and reactive to light.  Cardiovascular:     Rate and Rhythm: Rhythm irregular.  Pulmonary:     Effort: No respiratory distress.  Abdominal:     Palpations: Abdomen is soft.  Musculoskeletal:        General: Normal range of motion.     Cervical back: Normal range of motion.  Skin:    General: Skin is warm.     Capillary Refill: Capillary refill takes less than 2 seconds.  Neurological:     General: No focal deficit present.     Mental Status: She is alert.    Vital Signs: BP 126/71   Pulse 70   Temp 97.9 F (36.6 C) (Oral)   Resp (!) 23    Ht 5' (1.524 m)   Wt 43.7 kg   SpO2 100%   BMI 18.82 kg/m  Pain Scale: 0-10   Pain Score: 0-No pain  SpO2: SpO2: 100 % O2 Device:SpO2: 100 % O2 Flow Rate: .O2 Flow Rate (L/min): 3 L/min  IO: Intake/output summary:   Intake/Output Summary (Last 24 hours) at 05/12/2019 0819 Last data filed at 05/12/2019 4599 Gross per 24 hour  Intake 2962.21 ml  Output 1100 ml  Net 1862.21 ml   LBM: Last BM Date: 05/09/19 Baseline Weight: Weight: 46.7 kg Most recent weight: Weight: 43.7 kg     Palliative Assessment/Data: 50%   Time In: 0645 Time Out: 0800 Time Total: 75 Greater than 50%  of this time was spent counseling and coordinating care related to the above assessment and plan.  Signed by: Ernie Avena, NP   Please contact Palliative Medicine Team phone at [phone] for questions and concerns.  For individual provider: See Loretha Stapler

## 2019-05-12 NOTE — Progress Notes (Addendum)
PROGRESS NOTE    [name]  FOY:[id number] DOB: 03/09/1930 DOA: 05/09/2019 PCP: [name], MD   Brief Narrative: 86 year old with past medical history significant for hypertension, combined systolic and diastolic heart failure, A. 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  Patient wake up her daughter at 4:30 AM stating that she is not able to breath.  Shortness of breath did not improve with breathing treatments.  Patient presented to the ED for further evaluation. In ED patient was found to be in A. fib RVR, heart rate up to 160, she was tachypneic, chest x-ray showed mild cardiomegaly without signs of edema.  Patient received IV Solu-Medrol, IV Lasix and magnesium sulfate.   Assessment & Plan:   Principal Problem:   Acute on chronic respiratory failure with hypoxia (HCC) Active Problems:   Diabetes mellitus without complication, without long-term current use of insulin (HCC)   COPD with acute exacerbation (HCC)   Alzheimer disease (HCC)   Atrial fibrillation with RVR (HCC)   Combined systolic and diastolic cardiac dysfunction   Transaminitis   Palliative care by specialist   Delirium   DNR (do not resuscitate)   Muscular weakness   Spiritual or religious counseling  1-Acute om  Chronic hypoxic respiratory failure secondary to COPD exacerbation and A. fib with RVR: Treated  with IV Solu-Medrol, Brovana with desonide, albuterol. Pulmonology is consulted. Appreciate assistance.  Plan to start prednisone 1-17,  On Pulmicort, ipratropium, brovana.  Breathing better, appears stable.   2-Paroxysmal A. fib with RVR; patient presented with heart rate in the 160 RVR. On aspirin, not on anticoagulation due to high risk for fall. On oral Cardizem.  Appreciate cardiology follow-up.  Echo Severe  Mitral valve regurgitation, Ef 35 %, hypokinesis. Plan per cardiology. No candidate for mitral clip.  3-Leukocytosis: White blood cell trending down.  Chest X ray  negative for pneumonia. Afebrile. Trending down.  On Doxy for COPD exacerbation.   4-Combined systolic and diastolic congestive heart failure: BNP elevated 1267. Lasix on hold due to AKI.  Cardiology following. Continue to hold lasix per cardiology.   5-Diabetes type 2: Hyperglycemia on admission. Continue with a sliding scale insulin.  low-dose Lantus  6-Hypertensive urgency: Improved on Coreg  7-Transaminases: Follow trend Stable.   8-Alzheimer's dementia: Mild per family. Continue with Namenda  AKI: Lasix on hold, improved with fluids.  Hold ACE.   improved.   Urine retention;  Started Flomax.  Foley catheter placed. She will need voiding trial in 24 hours.   Estimated body mass index is 18.82 kg/m as calculated from the following:   Height as of this encounter: 5' (1.524 m).   Weight as of this encounter: 43.7 kg.   DVT prophylaxis: Lovenox Code Status: Partial Code: No Chest compression, Yes to medication, Intubation, defibrillation..  Family Communication: Care discussed with Daughter who was at bedside 1-15 Disposition Plan: Remain in the hospital for treatment of resp failure, Pending decision in regards CIR vs Home  Consultants:   Cardiology  Pulmonary   Procedures:   ECHO  Antimicrobials:    Subjective: She feels better, dyspnea improved  Denies nausea or abdominal pain.  She has been eating some.    Objective: Vitals:   05/12/19 0756 05/12/19 0757 05/12/19 0800 05/12/19 1000  BP: 126/71  (!) 121/56   Pulse: 70  66 67  Resp: (!) 23  (!) 23   Temp:      TempSrc:      SpO2:  100%    Weight:      Height:        Intake/Output Summary (Last 24 hours) at 05/12/2019 1111 Last data filed at 05/12/2019 1011 Gross per 24 hour  Intake 2448.21 ml  Output 550 ml  Net 1898.21 ml     Filed Weights   05/10/19 0337 05/11/19 0426 05/12/19 0404  Weight: 40.4 kg 44.1 kg 43.7 kg    Examination:  General exam: NAD Respiratory system: No wheezing, no ronchus.  Cardiovascular system: S 1, S 2 RRR Gastrointestinal system: BS present, soft, nt Central nervous system: Alert, follows command Extremities: no edema Skin: No rahses   Data Reviewed: I have personally reviewed following labs and imaging studies  CBC: Recent Labs  Lab 05/09/19 0627 05/10/19 0202 05/11/19 0916 05/12/19 0219  WBC 14.7* 13.3* 13.0* 11.7*  HGB 11.6* 11.4* 12.2 10.1*  HCT 37.9 38.3 38.8 32.3*  MCV 103.3* 102.4* 97.7 97.9  PLT 176 179 153 616*   Basic Metabolic Panel: Recent Labs  Lab 05/09/19 0627 05/10/19 0202 05/10/19 0952 05/11/19 0916 05/12/19 0219  NA 139 138 142 142 138  K 4.3 5.1 5.1 4.4 4.5  CL 100 98 104 103 102  CO2 23 23 24 26 27   GLUCOSE 341* 343* 125* 219* 199*  BUN 16 29* 34* 28* 31*  CREATININE 0.90 1.25* 1.57* 0.93 0.69  CALCIUM 9.0 8.8* 8.8* 9.0 8.7*  MG  --  2.2  --   --   --    GFR: Estimated Creatinine Clearance: 34.2 mL/min (by C-G formula based on SCr of 0.69 mg/dL). Liver Function Tests: Recent Labs  Lab 05/09/19 0627 05/10/19 0202 05/11/19 0916  AST 52* 57* 44*  ALT 52* 53* 52*  ALKPHOS 45 44 46  BILITOT 1.0 0.7 0.7  PROT 7.3 7.4 7.6  ALBUMIN 4.0 4.1 3.8   No results for input(s): LIPASE, AMYLASE in the last 168 hours. No results for input(s): AMMONIA in the last 168 hours. Coagulation Profile: No results for input(s): INR, PROTIME in the last 168 hours. Cardiac Enzymes: No results for input(s): CKTOTAL, CKMB, CKMBINDEX, TROPONINI in the last 168 hours. BNP (last 3 results) No results for input(s): PROBNP in the last 8760 hours. HbA1C: Recent Labs    05/09/19 1443  HGBA1C 6.0*   CBG: Recent Labs  Lab 05/11/19 0425 05/11/19 1000 05/11/19 1600 05/11/19 2101 05/12/19 0652  GLUCAP 123* 266* 394* 203* 162*   Lipid Profile: No  results for input(s): CHOL, HDL, LDLCALC, TRIG, CHOLHDL, LDLDIRECT in the last 72 hours. Thyroid Function Tests: Recent Labs    05/09/19 2141  TSH 1.429   Anemia Panel: No results for input(s): VITAMINB12, FOLATE, FERRITIN, TIBC, IRON, RETICCTPCT in the last 72 hours. Sepsis Labs: No results for input(s): PROCALCITON, LATICACIDVEN in the last 168 hours.  Recent Results (from the past 240 hour(s))  SARS CORONAVIRUS 2 (TAT 6-24 HRS) Nasopharyngeal Nasopharyngeal Swab     Status: None   Collection Time: 05/09/19  6:56 AM   Specimen: Nasopharyngeal Swab  Result Value Ref Range Status   SARS Coronavirus 2 NEGATIVE NEGATIVE Final    Comment: (NOTE) SARS-CoV-2 target nucleic acids are NOT DETECTED. The SARS-CoV-2 RNA is generally detectable in upper and lower respiratory specimens during the acute phase of infection. Negative results do not preclude SARS-CoV-2 infection, do not rule out co-infections with other pathogens,  and should not be used as the sole basis for treatment or other patient management decisions. Negative results must be combined with clinical observations, patient history, and epidemiological information. The expected result is Negative. Fact Sheet for Patients: HairSlick.no Fact Sheet for Healthcare Providers: quierodirigir.com This test is not yet approved or cleared by the [place] FDA and  has been authorized for detection and/or diagnosis of SARS-CoV-2 by FDA under an Emergency Use Authorization (EUA). This EUA will remain  in effect (meaning this test can be used) for the duration of the COVID-19 declaration under Section 56 4(b)(1) of the Act, 21 U.S.C. section 360bbb-3(b)(1), unless the authorization is terminated or revoked sooner. Performed at [facility], [address], [place], [place] [address]   MRSA PCR Screening     Status: None   Collection Time: 05/09/19 10:30 PM   Specimen:  Nasopharyngeal  Result Value Ref Range Status   MRSA by PCR NEGATIVE NEGATIVE Final    Comment:        The GeneXpert MRSA Assay (FDA approved for NASAL specimens only), is one component of a comprehensive MRSA colonization surveillance program. It is not intended to diagnose MRSA infection nor to guide or monitor treatment for MRSA infections. Performed at [facility], [address] [place], [place] [address]          Radiology Studies: No results found.      Scheduled Meds: . arformoterol  15 mcg Nebulization BID  . aspirin EC  81 mg Oral Daily  . atorvastatin  10 mg Oral q1800  . budesonide (PULMICORT) nebulizer solution  0.5 mg Nebulization BID  . carvedilol  6.25 mg Oral BID WC  . Chlorhexidine Gluconate Cloth  6 each Topical Daily  . digoxin  0.0625 mg Oral Daily  . diltiazem  30 mg Oral Q6H  . enoxaparin (LOVENOX) injection  40 mg Subcutaneous Daily  . feeding supplement (ENSURE ENLIVE)  237 mL Oral TID BM  . guaiFENesin  600 mg Oral BID  . insulin aspart  0-9 Units Subcutaneous Q6H  . insulin glargine  6 Units Subcutaneous Daily  . ipratropium  0.5 mg Nebulization BID  . memantine  10 mg Oral BID  . predniSONE  40 mg Oral Q breakfast  . senna  1 tablet Oral Daily  . sodium chloride flush  3 mL Intravenous Q12H  . sodium chloride flush  3 mL Intravenous Q12H  . tamsulosin  0.4 mg Oral Daily   Continuous Infusions: . sodium chloride    . doxycycline (VIBRAMYCIN) IV 125 mL/hr at 05/12/19 1011     LOS: 3 days    Time spent: 35 minutes    Nellie Chevalier A Jizelle Conkey, MD [facility]   If 7PM-7AM, please contact night-coverage [email or web address] Password [email or web address] 05/12/2019, 11:11 AM

## 2019-05-13 ENCOUNTER — Telehealth: Payer: Self-pay | Admitting: Pulmonary Disease

## 2019-05-13 LAB — GLUCOSE, CAPILLARY
Glucose-Capillary: 219 mg/dL — ABNORMAL HIGH (ref 70–99)
Glucose-Capillary: 266 mg/dL — ABNORMAL HIGH (ref 70–99)
Glucose-Capillary: 441 mg/dL — ABNORMAL HIGH (ref 70–99)
Glucose-Capillary: 363 mg/dL — ABNORMAL HIGH (ref 70–99)
Glucose-Capillary: 126 mg/dL — ABNORMAL HIGH (ref 70–99)

## 2019-05-13 MED ORDER — GUAIFENESIN ER 600 MG PO TB12: 600.0000 mg | ORAL_TABLET | Freq: Two times a day (BID) | ORAL | 0 refills | Status: AC

## 2019-05-13 MED ORDER — CARVEDILOL 12.5 MG PO TABS
12.5000 mg | ORAL_TABLET | Freq: Two times a day (BID) | ORAL | Status: DC
  Administered 2019-05-13 – 2019-05-15 (×5): 12.5 mg via ORAL
  Filled 2019-05-13 (×5): qty 1

## 2019-05-13 MED ORDER — INSULIN GLARGINE 100 UNIT/ML ~~LOC~~ SOLN
10.0000 [IU] | Freq: Every day | SUBCUTANEOUS | Status: DC
  Administered 2019-05-14 – 2019-05-15 (×2): 10 [IU] via SUBCUTANEOUS
  Filled 2019-05-13 (×2): qty 0.1

## 2019-05-13 MED ORDER — INSULIN ASPART 100 UNIT/ML ~~LOC~~ SOLN
0.0000 [IU] | Freq: Three times a day (TID) | SUBCUTANEOUS | Status: DC
  Administered 2019-05-13: 15 [IU] via SUBCUTANEOUS
  Administered 2019-05-14: 3 [IU] via SUBCUTANEOUS
  Administered 2019-05-14: 12:00:00 11 [IU] via SUBCUTANEOUS
  Administered 2019-05-15: 12:00:00 5 [IU] via SUBCUTANEOUS

## 2019-05-13 MED ORDER — NYSTATIN 100000 UNIT/ML MT SUSP: 5.0000 mL | Freq: Four times a day (QID) | OROMUCOSAL | 0 refills | Status: DC

## 2019-05-13 MED ORDER — DILTIAZEM HCL 30 MG PO TABS: 30.0000 mg | ORAL_TABLET | Freq: Four times a day (QID) | ORAL | 0 refills | Status: DC

## 2019-05-13 MED ORDER — INSULIN ASPART 100 UNIT/ML ~~LOC~~ SOLN
0.0000 [IU] | Freq: Three times a day (TID) | SUBCUTANEOUS | Status: DC
  Administered 2019-05-13: 5 [IU] via SUBCUTANEOUS

## 2019-05-13 MED ORDER — INSULIN ASPART 100 UNIT/ML ~~LOC~~ SOLN: 0.0000 [IU] | Freq: Every day | SUBCUTANEOUS | Status: DC

## 2019-05-13 MED ORDER — INSULIN ASPART 100 UNIT/ML ~~LOC~~ SOLN: 3.0000 [IU] | Freq: Three times a day (TID) | SUBCUTANEOUS | Status: DC

## 2019-05-13 MED ORDER — DOXYCYCLINE HYCLATE 100 MG PO TABS
100.0000 mg | ORAL_TABLET | Freq: Two times a day (BID) | ORAL | Status: DC
  Administered 2019-05-13 – 2019-05-15 (×4): 100 mg via ORAL
  Filled 2019-05-13 (×4): qty 1

## 2019-05-13 MED ORDER — INSULIN ASPART 100 UNIT/ML ~~LOC~~ SOLN
2.0000 [IU] | Freq: Three times a day (TID) | SUBCUTANEOUS | Status: DC
  Administered 2019-05-14 – 2019-05-15 (×3): 2 [IU] via SUBCUTANEOUS

## 2019-05-13 MED ORDER — TAMSULOSIN HCL 0.4 MG PO CAPS: 0.4000 mg | ORAL_CAPSULE | Freq: Every day | ORAL | 0 refills | Status: AC

## 2019-05-13 MED ORDER — PREDNISONE 20 MG PO TABS: 40.0000 mg | ORAL_TABLET | Freq: Every day | ORAL | 0 refills | Status: DC

## 2019-05-13 MED ORDER — INSULIN GLARGINE 100 UNIT/ML ~~LOC~~ SOLN
4.0000 [IU] | Freq: Once | SUBCUTANEOUS | Status: AC
  Administered 2019-05-13: 14:00:00 4 [IU] via SUBCUTANEOUS
  Filled 2019-05-13 (×2): qty 0.04

## 2019-05-13 MED ORDER — INSULIN ASPART 100 UNIT/ML ~~LOC~~ SOLN
13.0000 [IU] | Freq: Once | SUBCUTANEOUS | Status: AC
  Administered 2019-05-13: 13 [IU] via SUBCUTANEOUS

## 2019-05-13 NOTE — Progress Notes (Signed)
Inpatient Rehab Admissions:  Inpatient Rehab Consult received.  I met with patient and her daughter at the bedside for rehabilitation assessment and to discuss goals and expectations of an inpatient rehab admission.  Pt's daughter is interest in CIR level therapies to return to baseline (supervision) prior to returning home.  Discussed that we have no beds available at this time, and are awaiting OT evaluations as well.  Will continue to follow for pt progress and possible admission (if she still qualifies) pending bed availability later this week.   Signed: Shann Medal, PT, DPT Admissions Coordinator [phone] 05/13/19  12:41 PM

## 2019-05-13 NOTE — Telephone Encounter (Signed)
Lorin Glass, MD  P [facility] Triage Pool   Cc: Tonia Brooms Rachel Bo, DO  With Icard or midlevel in next week or two.   Dan  ------------------------------------------------------------- Pt is currently admitted.

## 2019-05-13 NOTE — Progress Notes (Addendum)
Subjective:  Reports cotninues to have dyspnea but improving.  Has remained in sinus rhythm.  Renal function continues to improve (Cr 0.7).  BP stable (128/76), SpO2 100% on 3L .  Objective:  Vitals:   05/12/19 2355 05/13/19 0354 05/13/19 0556 05/13/19 0730  BP: 129/63 (!) 134/98 124/72 128/76  Pulse: 76 65  75  Resp: 15 17  (!) 23  Temp: 98 F (36.7 C) (!) 97.5 F (36.4 C)  97.8 F (36.6 C)  TempSrc: Oral Oral  Oral  SpO2: 100% 100%  100%  Weight:  43.8 kg    Height:        Intake/Output from previous day:  Intake/Output Summary (Last 24 hours) at 05/13/2019 7106 Last data filed at 05/13/2019 0300 Gross per 24 hour  Intake 703 ml  Output 1300 ml  Net -597 ml    Physical Exam: Affect appropriate Frail elderly black female  HEENT: normal Neck supple with no adenopathy JVP elevated  no bruits no thyromegaly Lungs clear with no wheezing  Heart:  S1/S2 MR  murmur, no rub, gallop or click PMI normal Abdomen: benighn, BS positve, no tenderness, no AAA no bruit.  No HSM or HJR Distal pulses intact with no bruits No edema Neuro non-focal Skin warm and dry No muscular weakness   Lab Results: Basic Metabolic Panel: Recent Labs    05/11/19 0916 05/12/19 0219  NA 142 138  K 4.4 4.5  CL 103 102  CO2 26 27  GLUCOSE 219* 199*  BUN 28* 31*  CREATININE 0.93 0.69  CALCIUM 9.0 8.7*   Liver Function Tests: Recent Labs    05/11/19 0916  AST 44*  ALT 52*  ALKPHOS 46  BILITOT 0.7  PROT 7.6  ALBUMIN 3.8   No results for input(s): LIPASE, AMYLASE in the last 72 hours. CBC: Recent Labs    05/11/19 0916 05/12/19 0219  WBC 13.0* 11.7*  HGB 12.2 10.1*  HCT 38.8 32.3*  MCV 97.7 97.9  PLT 153 134*    No results for input(s): HGBA1C in the last 72 hours. Fasting Lipid Panel: No results for input(s): CHOL, HDL, LDLCALC, TRIG, CHOLHDL, LDLDIRECT in the last 72 hours. Thyroid Function Tests: No results for input(s): TSH, T4TOTAL, T3FREE, THYROIDAB in the  last 72 hours.  Invalid input(s): FREET3 Anemia Panel: No results for input(s): VITAMINB12, FOLATE, FERRITIN, TIBC, IRON, RETICCTPCT in the last 72 hours.  Imaging: No results found.  Cardiac Studies:  ECG: afib rate 161 chronic RBBB    Telemetry: NSR rates 60-80s.  Frequent PVCs.  NSVT x 9 beats.  Medications:   . arformoterol  15 mcg Nebulization BID  . aspirin EC  81 mg Oral Daily  . atorvastatin  10 mg Oral q1800  . budesonide (PULMICORT) nebulizer solution  0.5 mg Nebulization BID  . carvedilol  6.25 mg Oral BID WC  . Chlorhexidine Gluconate Cloth  6 each Topical Daily  . digoxin  0.0625 mg Oral Daily  . diltiazem  30 mg Oral Q6H  . enoxaparin (LOVENOX) injection  40 mg Subcutaneous Daily  . feeding supplement (ENSURE ENLIVE)  237 mL Oral TID BM  . guaiFENesin  600 mg Oral BID  . insulin aspart  0-9 Units Subcutaneous TID PC & HS  . insulin glargine  6 Units Subcutaneous Daily  . memantine  10 mg Oral BID  . nystatin  5 mL Oral QID  . predniSONE  40 mg Oral Q breakfast  . senna  1 tablet  Oral Daily  . sodium chloride flush  3 mL Intravenous Q12H  . sodium chloride flush  3 mL Intravenous Q12H  . tamsulosin  0.4 mg Oral Daily     . sodium chloride    . doxycycline (VIBRAMYCIN) IV 100 mg (05/13/19 0040)    Assessment/Plan:  [name] is a 86 y.o. female with a hx of systolic and diastolic CHF, HTN, DM2, COPD on home O2 (follows with pulmonary medicine Dr Valeta Harms ) with ongoing tobacco abuse and atrial fibrillation not on anticoagulation who is being seen for the evaluation of PAF and CHF     1. Afib : converted to NSR.  She has been on carvedilol and digoxin as outpatient.  She has not been anticoagulated in past due to extreme low body weight and fall risk.  TTE shows EF 30-35%, severe MR - Continue carvedilol, digoxin - Given systolic dysfunction, would recommend stopping diltiazem.  Can titrate up carvedilol to 12.5 mg BID  2. CHF:  Echo shows severe MR  which is new since 2018 and EF 30-35%.  Given age and weight not a candidate for mitral clip. Diuretics held due to azotemia.  Continue  digoxin and can restart lotensin as renal function normalizes.  Hopefully degree of MR will be less as her COPD improves and now that she is not in rapid afib will repeat echo in 6 weeks in office   3. COPD:  On home oxygen, quit smoking 2 years ago.  No wheezing  on exam.  Steroids and nebs per primary service  4. Frequent PVCs/NSVT: will titrate up coreg as above   Donato Heinz 05/13/2019, 8:22 AM

## 2019-05-13 NOTE — Progress Notes (Signed)
Physical Therapy Treatment Patient Details Name: [name] MRN: [id number] DOB: August 15, 1929 Today's Date: 05/13/2019    History of Present Illness 86 year old with past medical history significant for hypertension, combined systolic and diastolic heart failure, A. 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    PT Comments    Pt tolerated treatment well despite reports of feeling unwell. Pt demonstrates much improved balance and mobility during session without posterior lean. Pt is able to initiate gait training for limited household distances due to fatigue. Pt will continue to benefit from PT POC to improve activity tolerance and balance. PT continues to recommend CIR as patient remains at a falls risk, and due to cognitive deficits is less likely to consistently utilize DME for mobility as instructed to by PT.  Follow Up Recommendations  CIR;Supervision/Assistance - 24 hour(HHPT if pt denied CIR)     Equipment Recommendations  None recommended by PT(pt owns RW)    Recommendations for Other Services       Precautions / Restrictions Precautions Precautions: Fall Restrictions Weight Bearing Restrictions: No    Mobility  Bed Mobility Overal bed mobility: Needs Assistance Bed Mobility: Supine to Sit     Supine to sit: Supervision        Transfers Overall transfer level: Needs assistance Equipment used: Rolling walker (2 wheeled) Transfers: Sit to/from [employer] Sit to Stand: Min guard Stand pivot transfers: Min assist       General transfer comment: stand pivot without use of device  Ambulation/Gait Ambulation/Gait assistance: Min guard Gait Distance (Feet): 30 Feet(30' x 2 trials) Assistive device: Rolling walker (2 wheeled) Gait Pattern/deviations: Step-to  pattern Gait velocity: reduced Gait velocity interpretation: 1.31 - 2.62 ft/sec, indicative of limited community ambulator General Gait Details: pt with shortened step to gait with slight increase in trunk flexion   Stairs             Wheelchair Mobility    Modified Rankin (Stroke Patients Only)       Balance Overall balance assessment: Needs assistance Sitting-balance support: Single extremity supported;Feet supported Sitting balance-Leahy Scale: Good Sitting balance - Comments: supervision   Standing balance support: Bilateral upper extremity supported Standing balance-Leahy Scale: Fair Standing balance comment: minG with BUE support of RW                            Cognition Arousal/Alertness: Awake/alert Behavior During Therapy: WFL for tasks assessed/performed Overall Cognitive Status: History of cognitive impairments - at baseline                                        Exercises      General Comments General comments (skin integrity, edema, etc.): VSS on 4L [place] despite pt reports of some SOB      Pertinent Vitals/Pain Pain Assessment: No/denies pain    Home Living                      Prior Function            PT Goals (current goals can now be found in the care plan section) Acute Rehab PT Goals Patient Stated Goal: To improve mobility and return home  Progress towards PT goals: Progressing toward goals    Frequency    Min 3X/week      PT Plan Current plan remains appropriate    Co-evaluation              AM-PAC PT "6 Clicks" Mobility   Outcome Measure  Help needed turning from your back to your side while in a flat bed without using bedrails?: None Help needed moving from lying on your back to sitting on the side of a flat bed without using bedrails?: None Help needed moving to and from a bed to a chair (including a wheelchair)?: A Little Help needed standing up from a chair using your arms  (e.g., wheelchair or bedside chair)?: A Little Help needed to walk in hospital room?: A Little Help needed climbing 3-5 steps with a railing? : A Lot 6 Click Score: 19    End of Session Equipment Utilized During Treatment: Oxygen Activity Tolerance: Patient tolerated treatment well Patient left: in chair;with call bell/phone within reach;with chair alarm set Nurse Communication: Mobility status PT Visit Diagnosis: Unsteadiness on feet (R26.81);Muscle weakness (generalized) (M62.81)     Time: 2902-1115 PT Time Calculation (min) (ACUTE ONLY): 25 min  Charges:  $Gait Training: 23-37 mins                     Zenaida Niece, PT, DPT Acute Rehabilitation Pager: [phone]    Zenaida Niece 05/13/2019, 12:06 PM

## 2019-05-13 NOTE — Progress Notes (Signed)
PHARMACIST - PHYSICIAN COMMUNICATION  CONCERNING: Antibiotic IV to Oral Route Change Policy  RECOMMENDATION: This patient is receiving doxycycline by the intravenous route.  Based on criteria approved by the Pharmacy and Therapeutics Committee, the antibiotic(s) is/are being converted to the equivalent oral dose form(s).   DESCRIPTION: These criteria include:  Patient being treated for a respiratory tract infection, urinary tract infection, cellulitis or clostridium difficile associated diarrhea if on metronidazole  The patient is not neutropenic and does not exhibit a GI malabsorption state  The patient is eating (either orally or via tube) and/or has been taking other orally administered medications for a least 24 hours  The patient is improving clinically and has a Tmax < 100.5  If you have questions about this conversion, please contact the Pharmacy Department  []  ( [phone] )  [place] []  ( [phone] )  [facility] [x]  ( [phone] )  [place] []  ( [phone] )  [facility] []  ( [phone] )  [facility]   Raeden Schippers, PharmD Clinical Pharmacist **Pharmacist phone directory can now be found on [email or web address] ([email or web address]).  Listed under [facility].    

## 2019-05-13 NOTE — Progress Notes (Addendum)
Chaplains are aware of the need for AD and will complete the document with the patient Tuesday or Wednesday morning. Chaplains remain available for support as needs arise.   *Addendum: Note in wrong Chart.*  Chaplain Resident, Amado Coe, M Div [phone] on-call

## 2019-05-13 NOTE — Progress Notes (Signed)
PROGRESS NOTE    [name]  ONG:[id number] DOB: 02/22/1930 DOA: 05/09/2019 PCP: [name], MD   Brief Narrative: 86 year old with past medical history significant for hypertension, combined systolic and diastolic heart failure, A. 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  Patient wake up her daughter at 4:30 AM stating that she is not able to breath.  Shortness of breath did not improve with breathing treatments.  Patient presented to the ED for further evaluation. In ED patient was found to be in A. fib RVR, heart rate up to 160, she was tachypneic, chest x-ray showed mild cardiomegaly without signs of edema.  Patient received IV Solu-Medrol, IV Lasix and magnesium sulfate.   Assessment & Plan:   Principal Problem:   Acute on chronic respiratory failure with hypoxia (HCC) Active Problems:   Diabetes mellitus without complication, without long-term current use of insulin (HCC)   COPD with acute exacerbation (HCC)   Alzheimer disease (HCC)   Atrial fibrillation with RVR (HCC)   Combined systolic and diastolic cardiac dysfunction   Transaminitis   Palliative care by specialist   Delirium   DNR (do not resuscitate)   Muscular weakness   Spiritual or religious counseling  1-Acute om  Chronic hypoxic respiratory failure secondary to COPD exacerbation and A. fib with RVR: Treated  with IV Solu-Medrol, Brovana with desonide, albuterol. Pulmonology is consulted. Appreciate assistance.  Plan to start prednisone 1-17,  On Pulmicort, ipratropium, brovana.  Had SOB this am, improved after nebulizer.   2-Paroxysmal A. fib with RVR; patient presented with heart rate in the 160 RVR. On aspirin, not on anticoagulation due to high risk for fall. On oral Cardizem.  Appreciate cardiology follow-up.  Echo  Severe Mitral valve regurgitation, Ef 35 %, hypokinesis. Plan per cardiology. No candidate for mitral clip. Started on Coreg today for PVC.   3-Leukocytosis: White blood cell trending down.  Chest X ray  negative for pneumonia. Afebrile. Trending down.  On Doxy for COPD exacerbation.   4-Combined systolic and diastolic congestive heart failure: BNP elevated 1267. Lasix on hold due to AKI.  Cardiology following. Continue to hold lasix per cardiology.   5-Diabetes type 2: Hyperglycemia on admission. Continue with a sliding scale insulin. Hyperglycemia, change diet to carb modified, change SSI to resistant. Increase lantus to 10 units.   6-Hypertensive urgency: Improved on Coreg  7-Transaminases: Follow trend Stable.   8-Alzheimer's dementia: Mild per family. Continue with Namenda  AKI: Lasix on hold, improved with fluids.  Hold ACE.  Improved.   Urine retention;  Started Flomax.  Plan to remove foley catheter today.   Estimated body mass index is 18.86 kg/m as calculated from the following:   Height as of this encounter: 5' (1.524 m).   Weight as of this encounter: 43.8 kg.   DVT prophylaxis: Lovenox Code Status: Partial Code: No Chest compression, Yes to medication, Intubation, defibrillation..  Family Communication: Care discussed with Daughter who was at bedside 1-18 Disposition Plan: plan for CIR when bed available, no bed available today also Need to correct Hyperglycemia, CBG 400 range today,  Consultants:   Cardiology  Pulmonary   Procedures:   ECHO  Antimicrobials:    Subjective: Alert, doesn't feels as well as yesterday. Report some dyspnea.    Objective: Vitals:   05/13/19 1115 05/13/19 1200 05/13/19 1600 05/13/19 1606  BP: (!) 122/45   (!) 154/69  Pulse: 68 74 77 80  Resp: 20 (!) 25 (!) 30 (!) 25  Temp: 97.7 F (36.5 C)   (!) 97.5 F (36.4 C)  TempSrc: Oral   Oral  SpO2: 99% 100% 100% 100%  Weight:      Height:         Intake/Output Summary (Last 24 hours) at 05/13/2019 1628 Last data filed at 05/13/2019 1607 Gross per 24 hour  Intake 555 ml  Output 1550 ml  Net -995 ml   Filed Weights   05/11/19 0426 05/12/19 0404 05/13/19 0354  Weight: 44.1 kg 43.7 kg 43.8 kg    Examination:  General exam: NAD Respiratory system: No wheezing, or crackles, some accessory muscle use today  Cardiovascular system: S 1, S 2 IRR Gastrointestinal system: BS present, soft ,nt Central nervous system; alert, follows command Extremities: No edema Skin: No rahses   Data Reviewed: I have personally reviewed following labs and imaging studies  CBC: Recent Labs  Lab 05/09/19 0627 05/10/19 0202 05/11/19 0916 05/12/19 0219  WBC 14.7* 13.3* 13.0* 11.7*  HGB 11.6* 11.4* 12.2 10.1*  HCT 37.9 38.3 38.8 32.3*  MCV 103.3* 102.4* 97.7 97.9  PLT 176 179 153 119*   Basic Metabolic Panel: Recent Labs  Lab 05/09/19 0627 05/10/19 0202 05/10/19 0952 05/11/19 0916 05/12/19 0219  NA 139 138 142 142 138  K 4.3 5.1 5.1 4.4 4.5  CL 100 98 104 103 102  CO2 23 23 24 26 27   GLUCOSE 341* 343* 125* 219* 199*  BUN 16 29* 34* 28* 31*  CREATININE 0.90 1.25* 1.57* 0.93 0.69  CALCIUM 9.0 8.8* 8.8* 9.0 8.7*  MG  --  2.2  --   --   --    GFR: Estimated Creatinine Clearance: 34.3 mL/min (by C-G formula based on SCr of 0.69 mg/dL). Liver Function Tests: Recent Labs  Lab 05/09/19 0627 05/10/19 0202 05/11/19 0916  AST 52* 57* 44*  ALT 52* 53* 52*  ALKPHOS 45 44 46  BILITOT 1.0 0.7 0.7  PROT 7.3 7.4 7.6  ALBUMIN 4.0 4.1 3.8   No results for input(s): LIPASE, AMYLASE in the last 168 hours. No results for input(s): AMMONIA in the last 168 hours. Coagulation Profile: No results for input(s): INR, PROTIME in the last 168 hours. Cardiac Enzymes: No results for input(s): CKTOTAL, CKMB, CKMBINDEX, TROPONINI in the last 168 hours. BNP (last 3 results) No results for input(s): PROBNP in the last 8760 hours. HbA1C: No results  for input(s): HGBA1C in the last 72 hours. CBG: Recent Labs  Lab 05/12/19 1613 05/12/19 2122 05/13/19 0416 05/13/19 0951 05/13/19 1116  GLUCAP 357* 257* 219* 266* 441*   Lipid Profile: No results for input(s): CHOL, HDL, LDLCALC, TRIG, CHOLHDL, LDLDIRECT in the last 72 hours. Thyroid Function Tests: No results for input(s): TSH, T4TOTAL, FREET4, T3FREE, THYROIDAB in the last 72 hours. Anemia Panel: No results for input(s): VITAMINB12, FOLATE, FERRITIN, TIBC, IRON, RETICCTPCT in the last 72 hours. Sepsis Labs: No results for input(s): PROCALCITON, LATICACIDVEN in the last 168 hours.  Recent Results (from the past 240 hour(s))  SARS CORONAVIRUS 2 (TAT 6-24 HRS) Nasopharyngeal Nasopharyngeal Swab     Status: None   Collection Time: 05/09/19  6:56 AM   Specimen: Nasopharyngeal Swab  Result Value Ref Range Status   SARS Coronavirus 2 NEGATIVE NEGATIVE Final    Comment: (NOTE) SARS-CoV-2 target nucleic acids are NOT DETECTED. The SARS-CoV-2 RNA is generally detectable in  upper and lower respiratory specimens during the acute phase of infection. Negative results do not preclude SARS-CoV-2 infection, do not rule out co-infections with other pathogens, and should not be used as the sole basis for treatment or other patient management decisions. Negative results must be combined with clinical observations, patient history, and epidemiological information. The expected result is Negative. Fact Sheet for Patients: HairSlick.no Fact Sheet for Healthcare Providers: quierodirigir.com This test is not yet approved or cleared by the [place] FDA and  has been authorized for detection and/or diagnosis of SARS-CoV-2 by FDA under an Emergency Use Authorization (EUA). This EUA will remain  in effect (meaning this test can be used) for the duration of the COVID-19 declaration under Section 56 4(b)(1) of the Act, 21 U.S.C. section  360bbb-3(b)(1), unless the authorization is terminated or revoked sooner. Performed at [facility], [address], [place], [place] [address]   MRSA PCR Screening     Status: None   Collection Time: 05/09/19 10:30 PM   Specimen: Nasopharyngeal  Result Value Ref Range Status   MRSA by PCR NEGATIVE NEGATIVE Final    Comment:        The GeneXpert MRSA Assay (FDA approved for NASAL specimens only), is one component of a comprehensive MRSA colonization surveillance program. It is not intended to diagnose MRSA infection nor to guide or monitor treatment for MRSA infections. Performed at [facility], [address], [place], [place] [address]          Radiology Studies: No results found.      Scheduled Meds: . arformoterol  15 mcg Nebulization BID  . aspirin EC  81 mg Oral Daily  . atorvastatin  10 mg Oral q1800  . budesonide (PULMICORT) nebulizer solution  0.5 mg Nebulization BID  . carvedilol  12.5 mg Oral BID WC  . Chlorhexidine Gluconate Cloth  6 each Topical Daily  . digoxin  0.0625 mg Oral Daily  . doxycycline  100 mg Oral Q12H  . enoxaparin (LOVENOX) injection  40 mg Subcutaneous Daily  . feeding supplement (ENSURE ENLIVE)  237 mL Oral TID BM  . guaiFENesin  600 mg Oral BID  . insulin aspart  0-15 Units Subcutaneous TID WC  . insulin aspart  0-5 Units Subcutaneous QHS  . [START ON 05/14/2019] insulin glargine  10 Units Subcutaneous Daily  . memantine  10 mg Oral BID  . nystatin  5 mL Oral QID  . predniSONE  40 mg Oral Q breakfast  . senna  1 tablet Oral Daily  . sodium chloride flush  3 mL Intravenous Q12H  . sodium chloride flush  3 mL Intravenous Q12H  . tamsulosin  0.4 mg Oral Daily   Continuous Infusions: . sodium chloride       LOS: 4 days    Time spent: 35 minutes    Quindarrius Joplin A Kaylaann Mountz, MD [facility]   If 7PM-7AM, please contact night-coverage [email or web address] Password [facility] 05/13/2019, 4:28 PM

## 2019-05-13 NOTE — Evaluation (Signed)
Occupational Therapy Evaluation Patient Details Name: [name] MRN: [id number] DOB: 1929/05/03 Today's Date: 05/13/2019    History of Present Illness 86 year old with past medical history significant for hypertension, combined systolic and diastolic heart failure, A. 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   Clinical Impression   This 87 y/o female presents with the above. PTA pt mobilizing in home without AD and receiving intermittent assist for ADL tasks from her daughter. Pt presents seated in recliner pleasant and very willing to participate in therapy session. Pt requiring minA for functional transfers via RW and short distance mobility in room, currently requiring minA for seated UB ADL, min-modA for LB ADL. Pt on 3L O2 with VSS throughout. Daughter present and supportive as well. Pt will benefit from continued acute OT services, currently feel she will benefit from CIR level services to progress pt towards her PLOF. Will continue to follow for pt progress and to progress pt towards established acute OT goals.     Follow Up Recommendations  CIR;Supervision/Assistance - 24 hour(CIR vs Home with 24hr, pending progress)    Equipment Recommendations  None recommended by OT           Precautions / Restrictions Precautions Precautions: Fall Restrictions Weight Bearing Restrictions: No      Mobility Bed Mobility               General bed mobility comments: received OOB in recliner  Transfers Overall transfer level: Needs assistance Equipment used: Rolling walker (2 wheeled) Transfers: Sit to/from [facility] Sit to Stand: Min assist         General transfer comment: boosting assist from recliner, steadying assist required in standing    Balance Overall balance  assessment: Needs assistance Sitting-balance support: Single extremity supported;Feet supported Sitting balance-Leahy Scale: Good Sitting balance - Comments: supervision   Standing balance support: Bilateral upper extremity supported Standing balance-Leahy Scale: Poor Standing balance comment: reliant on UE support                           ADL either performed or assessed with clinical judgement   ADL Overall ADL's : Needs assistance/impaired Eating/Feeding: Set up;Sitting Eating/Feeding Details (indicate cue type and reason): setup with lunch tray end of session Grooming: Set up;Min guard;Sitting   Upper Body Bathing: Minimal assistance;Sitting   Lower Body Bathing: Minimal assistance;Moderate assistance;Sit to/from stand   Upper Body Dressing : Minimal assistance;Sitting   Lower Body Dressing: Moderate assistance;Minimal assistance;Sit to/from stand Lower Body Dressing Details (indicate cue type and reason): pt adjusting socks via figure 4, requires minA for standing balance  Toilet Transfer: Minimal assistance;BSC;Stand-pivot Toilet Transfer Details (indicate cue type and reason): simulated via transfer to/from recliner, short distance mobility in room Toileting- Clothing Manipulation and Hygiene: Moderate assistance;Sit to/from stand       Functional mobility during ADLs: Minimal assistance;Rolling walker       Vision         Perception     Praxis      Pertinent Vitals/Pain Pain Assessment: No/denies pain     Hand Dominance Right   Extremity/Trunk Assessment Upper Extremity Assessment Upper Extremity Assessment: Generalized weakness   Lower Extremity Assessment Lower Extremity Assessment: Defer to PT evaluation   Cervical / Trunk Assessment Cervical / Trunk Assessment: Kyphotic  Communication Communication Communication: HOH   Cognition Arousal/Alertness: Awake/alert Behavior During Therapy: WFL for tasks assessed/performed Overall  Cognitive Status: History of cognitive impairments - at baseline                                 General Comments: HOH but overall following commands well    General Comments  VSS on 3L    Exercises     Shoulder Instructions      Home Living Family/patient expects to be discharged to:: Private residence Living Arrangements: Children Available Help at Discharge: Family;Available 24 hours/day Type of Home: House Home Access: Stairs to enter [employer] of Steps: 1 Entrance Stairs-Rails: None Home Layout: Two level Alternate Level Stairs-Number of Steps: 14 Alternate Level Stairs-Rails: Left Bathroom Shower/Tub: Chief Strategy Officer: Standard     Home Equipment: Environmental consultant - 2 wheels;Bedside commode          Prior Functioning/Environment Level of Independence: Needs assistance  Gait / Transfers Assistance Needed: independent household ambulator, refuses use of RW and holds onto walls or furniture ADL's / Homemaking Assistance Needed: able to dress herself, daughter assists with bathing, reports typically does daily wash ups and then 1x/week assists with a bath            OT Problem List: Decreased strength;Decreased range of motion;Decreased activity tolerance;Impaired balance (sitting and/or standing);Decreased knowledge of use of DME or AE;Decreased cognition;Decreased safety awareness;Cardiopulmonary status limiting activity;Decreased knowledge of precautions      OT Treatment/Interventions: Self-care/ADL training;Therapeutic exercise;Energy conservation;DME and/or AE instruction;Therapeutic activities;Visual/perceptual remediation/compensation;Patient/family education;Cognitive remediation/compensation;Balance training    OT Goals(Current goals can be found in the care plan section) Acute Rehab OT Goals Patient Stated Goal: To improve mobility and return home OT Goal Formulation: With patient/family Time For Goal Achievement:  05/27/19 Potential to Achieve Goals: Good  OT Frequency: Min 2X/week   Barriers to D/C:            Co-evaluation              AM-PAC OT "6 Clicks" Daily Activity     Outcome Measure Help from another person eating meals?: A Little Help from another person taking care of personal grooming?: A Little Help from another person toileting, which includes using toliet, bedpan, or urinal?: A Lot Help from another person bathing (including washing, rinsing, drying)?: A Lot Help from another person to put on and taking off regular upper body clothing?: A Little Help from another person to put on and taking off regular lower body clothing?: A Lot 6 Click Score: 15   End of Session Equipment Utilized During Treatment: Rolling walker;Oxygen Nurse Communication: Mobility status  Activity Tolerance: Patient tolerated treatment well Patient left: in chair;with call bell/phone within reach;with chair alarm set;with family/visitor present  OT Visit Diagnosis: Unsteadiness on feet (R26.81);Muscle weakness (generalized) (M62.81)                Time: 6440-3474 OT Time Calculation (min): 17 min Charges:  OT General Charges $OT Visit: 1 Visit OT Evaluation $OT Eval Moderate Complexity: 1 Mod  Marcy Siren, OT [employer] Pager [phone] Office [phone]   [name] 05/13/2019, 3:27 PM

## 2019-05-13 NOTE — Consult Note (Signed)
   [facility] CM Inpatient Consult   05/13/2019  SHANYIAH CONDE 01/23/1930 [id number]    Patientscreened for 22%high risk scorefor unplanned readmission and hospitalization andto checkforpotential need of [facility]) care management services under her Medicare/ [employer] benefits.  Patient had been engaged by [facility]) care management coordinators in the past, for transition of care.   Our community based plan of care has focused on disease management and community resource support.  Per MD brief narrative, patient is:  86 year old with past medical history significant for hypertension, combined systolic and diastolic heart failure, A. 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 [Acute on Chronic hypoxic respiratory failure secondary to COPD exacerbation and A-fib with RVR (rapid ventricular response), Combined systolic and diastolic congestive HF]  Brief chartreviewrevealsPT evaluation completed and currently recommendingcomprehensive inpatient rehab (CIR) at discharge.  Awaiting Rehab admissions coordinator evaluation for possible CIR admission.  Primary Care Provider isKoirala, Dibas, MD with Deboraha Sprang at [facility], listed to provide transition of care follow-up.  Plan: Will followforprogress anddisposition, and ifthere are any changesin dispositionand needs for appropriate community follow-up,please referpatientto [facility] caremanagement.   For questionsand referral,please contact:   Karin Golden A. Praneeth Bussey, BSN, RN-BC [facility] Liaison Cell: [phone]

## 2019-05-13 NOTE — Progress Notes (Signed)
Responded to spiritual care consult. Family requested Chaplain return later because pt was talking with family in face time. I said I will follow up at a later time.   Chaplain Resident Orest Dikes MA [phone]

## 2019-05-13 NOTE — Progress Notes (Signed)
Inpatient Diabetes Program Recommendations  AACE/ADA: New Consensus Statement on Inpatient Glycemic Control (2015)  Target Ranges:  Prepandial:   less than 140 mg/dL      Peak postprandial:   less than 180 mg/dL (1-2 hours)      Critically ill patients:  140 - 180 mg/dL   Results for [name], [name] (MRN [id number]) as of 05/13/2019 07:46  Ref. Range 05/12/2019 06:52 05/12/2019 16:13 05/12/2019 21:22  Glucose-Capillary Latest Ref Range: 70 - 99 mg/dL 793 (H)  2 units NOVOLOG  357 (H)  9 units NOVOLOG  257 (H)  5 units NOVOLOG    Results for [name], [name] (MRN [id number]) as of 05/13/2019 12:14  Ref. Range 05/13/2019 09:51 05/13/2019 11:16  Glucose-Capillary Latest Ref Range: 70 - 99 mg/dL 007 (H)  5 units NOVOLOG  441 (H)    Home DM Meds: Novolog Daily as needed for CBG >300       Metformin 1000 mg BID  Current Orders: Lantus 6 units Daily      Novolog Moderate Correction Scale/ SSI (0-15 units) TID AC + HS      Getting Prednisone 40 mg Daily.  CBGs significantly elevated in the afternoon likely due to the effects of the Prednisone.     MD- Please consider also adding Novolog Meal Coverage since patient getting PO diet and also getting Ensure Enlive PO supplements TID between meals (each supp has 44 grams carbohydrates):  Novolog 6 units TID with meals  (Please add the following Hold Parameters: Hold if pt eats <50% of meal, Hold if pt NPO)      --Will follow patient during hospitalization--  Ambrose Finland RN, MSN, CDE Diabetes Coordinator Inpatient Glycemic Control Team Team Pager: [phone] (8a-5p)

## 2019-05-13 NOTE — Plan of Care (Signed)

## 2019-05-14 LAB — BASIC METABOLIC PANEL
Anion gap: 8 (ref 5–15)
BUN: 20 mg/dL (ref 8–23)
CO2: 32 mmol/L (ref 22–32)
Calcium: 8.9 mg/dL (ref 8.9–10.3)
Chloride: 103 mmol/L (ref 98–111)
Creatinine, Ser: 0.59 mg/dL (ref 0.44–1.00)
GFR calc Af Amer: >60 mL/min (ref >60)
GFR calc non Af Amer: >60 mL/min (ref >60)
Glucose, Bld: 84 mg/dL (ref 70–99)
Potassium: 4.2 mmol/L (ref 3.5–5.1)
Sodium: 143 mmol/L (ref 135–145)

## 2019-05-14 LAB — GLUCOSE, CAPILLARY
Glucose-Capillary: 102 mg/dL — ABNORMAL HIGH (ref 70–99)
Glucose-Capillary: 332 mg/dL — ABNORMAL HIGH (ref 70–99)
Glucose-Capillary: 194 mg/dL — ABNORMAL HIGH (ref 70–99)
Glucose-Capillary: 125 mg/dL — ABNORMAL HIGH (ref 70–99)

## 2019-05-14 MED ORDER — BENAZEPRIL HCL 5 MG PO TABS
10.0000 mg | ORAL_TABLET | Freq: Two times a day (BID) | ORAL | Status: DC
  Administered 2019-05-14 – 2019-05-15 (×3): 10 mg via ORAL
  Filled 2019-05-14 (×4): qty 2
  Filled 2019-05-14: qty 1

## 2019-05-14 NOTE — Progress Notes (Signed)
Subjective:  Reports dyspnea is improving  Objective:  Vitals:   05/13/19 2331 05/14/19 0337 05/14/19 0802 05/14/19 0819  BP: (!) 152/76 (!) 148/78 (!) 139/53   Pulse: 82 80 65   Resp: 15 15 (!) 23   Temp: 98.1 F (36.7 C) (!) 97.3 F (36.3 C) 98 F (36.7 C)   TempSrc: Oral Oral Oral   SpO2: 100% 100% 100% 100%  Weight:  40.6 kg    Height:        Intake/Output from previous day:  Intake/Output Summary (Last 24 hours) at 05/14/2019 1962 Last data filed at 05/14/2019 2297 Gross per 24 hour  Intake 125 ml  Output 1450 ml  Net -1325 ml    Physical Exam: Affect appropriate Frail elderly black female  HEENT: normal Neck supple with no adenopathy JVP elevated  no bruits no thyromegaly Scattered bibasilar crackles  Heart:  S1/S2 MR  murmur, no rub, gallop or click PMI normal Abdomen: benighn, BS positve, no tenderness, no AAA no bruit.  No HSM or HJR Distal pulses intact with no bruits No edema Neuro non-focal Skin warm and dry No muscular weakness   Lab Results: Basic Metabolic Panel: Recent Labs    05/12/19 0219 05/14/19 0236  NA 138 143  K 4.5 4.2  CL 102 103  CO2 27 32  GLUCOSE 199* 84  BUN 31* 20  CREATININE 0.69 0.59  CALCIUM 8.7* 8.9   Liver Function Tests: Recent Labs    05/11/19 0916  AST 44*  ALT 52*  ALKPHOS 46  BILITOT 0.7  PROT 7.6  ALBUMIN 3.8   No results for input(s): LIPASE, AMYLASE in the last 72 hours. CBC: Recent Labs    05/11/19 0916 05/12/19 0219  WBC 13.0* 11.7*  HGB 12.2 10.1*  HCT 38.8 32.3*  MCV 97.7 97.9  PLT 153 134*    No results for input(s): HGBA1C in the last 72 hours. Fasting Lipid Panel: No results for input(s): CHOL, HDL, LDLCALC, TRIG, CHOLHDL, LDLDIRECT in the last 72 hours. Thyroid Function Tests: No results for input(s): TSH, T4TOTAL, T3FREE, THYROIDAB in the last 72 hours.  Invalid input(s): FREET3 Anemia Panel: No results for input(s): VITAMINB12, FOLATE, FERRITIN, TIBC, IRON, RETICCTPCT  in the last 72 hours.  Imaging: No results found.  Cardiac Studies:  ECG: afib rate 161 chronic RBBB.  No new ECG    Telemetry: NSR rates 60s.  PVCs Medications:   . arformoterol  15 mcg Nebulization BID  . aspirin EC  81 mg Oral Daily  . atorvastatin  10 mg Oral q1800  . budesonide (PULMICORT) nebulizer solution  0.5 mg Nebulization BID  . carvedilol  12.5 mg Oral BID WC  . Chlorhexidine Gluconate Cloth  6 each Topical Daily  . digoxin  0.0625 mg Oral Daily  . doxycycline  100 mg Oral Q12H  . enoxaparin (LOVENOX) injection  40 mg Subcutaneous Daily  . feeding supplement (ENSURE ENLIVE)  237 mL Oral TID BM  . guaiFENesin  600 mg Oral BID  . insulin aspart  0-15 Units Subcutaneous TID WC  . insulin aspart  0-5 Units Subcutaneous QHS  . insulin aspart  2 Units Subcutaneous TID WC  . insulin glargine  10 Units Subcutaneous Daily  . memantine  10 mg Oral BID  . nystatin  5 mL Oral QID  . predniSONE  40 mg Oral Q breakfast  . senna  1 tablet Oral Daily  . sodium chloride flush  3 mL Intravenous Q12H  .  sodium chloride flush  3 mL Intravenous Q12H  . tamsulosin  0.4 mg Oral Daily     . sodium chloride      Assessment/Plan:  [name] is a 86 y.o. female with a hx of systolic and diastolic CHF, HTN, DM2, COPD on home O2 (follows with pulmonary medicine Dr Tonia Brooms ) with ongoing tobacco abuse and atrial fibrillation not on anticoagulation who is being seen for the evaluation of PAF and CHF     1. Afib : converted to NSR.  She has been on carvedilol and digoxin as outpatient.  She has not been anticoagulated in past due to extreme low body weight and fall risk.  TTE shows EF 30-35%, severe MR - Continue carvedilol, digoxin.  Stopped diltiazem given systolic dysfunction.  2. CHF:  Echo shows severe MR which is new since 2018 and EF 30-35%.  Given age and weight not a candidate for mitral clip. Diuretics held due to azotemia.  Continue digoxin and will restart benzapril as renal  function has normalized and BP has been elevated.  Hopefully degree of MR will be less as her COPD improves and now that she is not in rapid afib will repeat echo in 6 weeks in office   3. COPD:  On home oxygen, quit smoking 2 years ago.  No wheezing  on exam.  Steroids and nebs per primary service  4. Frequent PVCs/NSVT: continue coreg, increased to 12.5 mg BID yesterday   Little Ishikawa 05/14/2019, 8:23 AM

## 2019-05-14 NOTE — Progress Notes (Addendum)
PROGRESS NOTE    [name]  YTK:[id number] DOB: 1930-03-27 DOA: 05/09/2019 PCP: [name], MD   Brief Narrative: 86 year old with past medical history significant for hypertension, combined systolic and diastolic heart failure, A. 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  Patient wake up her daughter at 4:30 AM stating that she is not able to breath.  Shortness of breath did not improve with breathing treatments.  Patient presented to the ED for further evaluation. In ED patient was found to be in A. fib RVR, heart rate up to 160, she was tachypneic, chest x-ray showed mild cardiomegaly without signs of edema.  Patient received IV Solu-Medrol, IV Lasix and magnesium sulfate.  Patient admitted and managed for COPD exacerbation and A. fib with RVR.  Patient has remained stable and awaiting CIR admission.  Assessment & Plan:   Principal Problem:   Acute on chronic respiratory failure with hypoxia (HCC) Active Problems:   Diabetes mellitus without complication, without long-term current use of insulin (HCC)   COPD with acute exacerbation (HCC)   Alzheimer disease (HCC)   Atrial fibrillation with RVR (HCC)   Combined systolic and diastolic cardiac dysfunction   Transaminitis   Palliative care by specialist   Delirium   DNR (do not resuscitate)   Muscular weakness   Spiritual or religious counseling  1-Acute om  Chronic hypoxic respiratory failure secondary to COPD exacerbation and A. fib with RVR: Treated  with IV Solu-Medrol, Brovana with desonide, albuterol. Pulmonology is consulted. Appreciate assistance.  Plan to start prednisone 1-17,  On Pulmicort, ipratropium, brovana.  She is breathing better today.  Awaiting CIR  2-Paroxysmal A. fib with RVR; patient presented with heart rate in the  160 RVR. On aspirin, not on anticoagulation due to high risk for fall. She was on IV Cardizem drip subsequently oral Cardizem.  Now Cardizem has been stopped due to lower ejection fraction. Appreciate cardiology follow-up.  Echo Severe Mitral valve regurgitation, Ef 35 %, hypokinesis. Plan per cardiology. No candidate for mitral clip. Started on Coreg today for PVC.   3-Leukocytosis: White blood cell trending down.  Chest X ray  negative for pneumonia. Afebrile. Trending down.  On Doxy for COPD exacerbation.   4-Combined systolic and diastolic congestive heart failure: BNP elevated 1267. Lasix on hold due to AKI.  Cardiology following. Continue to hold lasix per cardiology.   5-Diabetes type 2: Hyperglycemia on admission. Continue with a sliding scale insulin. Hyperglycemia, change diet to carb modified, change SSI to resistant.  On lantus to 10 units.  Added 3 units of meal coverage  6-Hypertensive urgency: Improved on Coreg  7-Transaminases: Follow trend Stable.   8-Alzheimer's dementia: Mild per family. Continue with Namenda  AKI: Lasix on hold, improved with fluids.  Hold ACE.  Improved.   Urine retention;  Started Flomax.  Voiding trial  Severe malnutrition in contest of chronic illness.   Estimated body mass index is 17.48 kg/m as calculated from the following:   Height as of this encounter: 5' (1.524 m).   Weight as of this encounter: 40.6 kg.   DVT prophylaxis: Lovenox Code Status: Partial Code: No Chest compression, Yes to medication, Intubation, defibrillation..  Family Communication: Care discussed with Daughter who was at bedside 1-18 Disposition Plan: plan for CIR when bed available, no bed available today also Need to correct Hyperglycemia,  CBG 400 range today,  Consultants:   Cardiology  Pulmonary   Procedures:   ECHO  Antimicrobials:    Subjective: She is alert, she reports breathing has improved  Objective: Vitals:   05/14/19  0802 05/14/19 0819 05/14/19 1125 05/14/19 1127  BP: (!) 139/53  (!) 135/55   Pulse: 65  67   Resp: (!) 23  (!) 21   Temp: 98 F (36.7 C)   98 F (36.7 C)  TempSrc: Oral   Oral  SpO2: 100% 100% 98%   Weight:      Height:        Intake/Output Summary (Last 24 hours) at 05/14/2019 1441 Last data filed at 05/14/2019 1128 Gross per 24 hour  Intake 230 ml  Output 1900 ml  Net -1670 ml   Filed Weights   05/12/19 0404 05/13/19 0354 05/14/19 0337  Weight: 43.7 kg 43.8 kg 40.6 kg    Examination:  General exam: NAD Respiratory system: Clear to auscultation,  no accessory respiratory muscle use Cardiovascular system: S1-S2 regular rhythm or rate Gastrointestinal system: Bowel sounds present, soft nontender nondistended Central nervous system; alert following commands Extremities: No edema Skin: No rashes   Data Reviewed: I have personally reviewed following labs and imaging studies  CBC: Recent Labs  Lab 05/09/19 0627 05/10/19 0202 05/11/19 0916 05/12/19 0219  WBC 14.7* 13.3* 13.0* 11.7*  HGB 11.6* 11.4* 12.2 10.1*  HCT 37.9 38.3 38.8 32.3*  MCV 103.3* 102.4* 97.7 97.9  PLT 176 179 153 134*   Basic Metabolic Panel: Recent Labs  Lab 05/10/19 0202 05/10/19 0952 05/11/19 0916 05/12/19 0219 05/14/19 0236  NA 138 142 142 138 143  K 5.1 5.1 4.4 4.5 4.2  CL 98 104 103 102 103  CO2 23 24 26 27  32  GLUCOSE 343* 125* 219* 199* 84  BUN 29* 34* 28* 31* 20  CREATININE 1.25* 1.57* 0.93 0.69 0.59  CALCIUM 8.8* 8.8* 9.0 8.7* 8.9  MG 2.2  --   --   --   --    GFR: Estimated Creatinine Clearance: 31.8 mL/min (by C-G formula based on SCr of 0.59 mg/dL). Liver Function Tests: Recent Labs  Lab 05/09/19 0627 05/10/19 0202 05/11/19 0916  AST 52* 57* 44*  ALT 52* 53* 52*  ALKPHOS 45 44 46  BILITOT 1.0 0.7 0.7  PROT 7.3 7.4 7.6  ALBUMIN 4.0 4.1 3.8   No results for input(s): LIPASE, AMYLASE in the last 168 hours. No results for input(s): AMMONIA in the last 168  hours. Coagulation Profile: No results for input(s): INR, PROTIME in the last 168 hours. Cardiac Enzymes: No results for input(s): CKTOTAL, CKMB, CKMBINDEX, TROPONINI in the last 168 hours. BNP (last 3 results) No results for input(s): PROBNP in the last 8760 hours. HbA1C: No results for input(s): HGBA1C in the last 72 hours. CBG: Recent Labs  Lab 05/13/19 1116 05/13/19 1619 05/13/19 2128 05/14/19 0631 05/14/19 1126  GLUCAP 441* 363* 126* 102* 332*   Lipid Profile: No results for input(s): CHOL, HDL, LDLCALC, TRIG, CHOLHDL, LDLDIRECT in the last 72 hours. Thyroid Function Tests: No results for input(s): TSH, T4TOTAL, FREET4, T3FREE, THYROIDAB in the last 72 hours. Anemia Panel: No results for input(s): VITAMINB12, FOLATE, FERRITIN, TIBC, IRON, RETICCTPCT in the last 72 hours. Sepsis Labs: No results for input(s): PROCALCITON, LATICACIDVEN in the last 168 hours.  Recent Results (from the past 240 hour(s))  SARS CORONAVIRUS 2 (TAT 6-24 HRS) Nasopharyngeal Nasopharyngeal Swab     Status: None  Collection Time: 05/09/19  6:56 AM   Specimen: Nasopharyngeal Swab  Result Value Ref Range Status   SARS Coronavirus 2 NEGATIVE NEGATIVE Final    Comment: (NOTE) SARS-CoV-2 target nucleic acids are NOT DETECTED. The SARS-CoV-2 RNA is generally detectable in upper and lower respiratory specimens during the acute phase of infection. Negative results do not preclude SARS-CoV-2 infection, do not rule out co-infections with other pathogens, and should not be used as the sole basis for treatment or other patient management decisions. Negative results must be combined with clinical observations, patient history, and epidemiological information. The expected result is Negative. Fact Sheet for Patients: SugarRoll.be Fact Sheet for Healthcare Providers: https://www.woods-mathews.com/ This test is not yet approved or cleared by the [place] FDA and   has been authorized for detection and/or diagnosis of SARS-CoV-2 by FDA under an Emergency Use Authorization (EUA). This EUA will remain  in effect (meaning this test can be used) for the duration of the COVID-19 declaration under Section 56 4(b)(1) of the Act, 21 U.S.C. section 360bbb-3(b)(1), unless the authorization is terminated or revoked sooner. Performed at [facility], [place] [address], [place], [place] [address]   MRSA PCR Screening     Status: None   Collection Time: 05/09/19 10:30 PM   Specimen: Nasopharyngeal  Result Value Ref Range Status   MRSA by PCR NEGATIVE NEGATIVE Final    Comment:        The GeneXpert MRSA Assay (FDA approved for NASAL specimens only), is one component of a comprehensive MRSA colonization surveillance program. It is not intended to diagnose MRSA infection nor to guide or monitor treatment for MRSA infections. Performed at [facility], [place] [address] [place], [place] [address]          Radiology Studies: No results found.      Scheduled Meds: . arformoterol  15 mcg Nebulization BID  . aspirin EC  81 mg Oral Daily  . atorvastatin  10 mg Oral q1800  . benazepril  10 mg Oral BID  . budesonide (PULMICORT) nebulizer solution  0.5 mg Nebulization BID  . carvedilol  12.5 mg Oral BID WC  . Chlorhexidine Gluconate Cloth  6 each Topical Daily  . digoxin  0.0625 mg Oral Daily  . doxycycline  100 mg Oral Q12H  . enoxaparin (LOVENOX) injection  40 mg Subcutaneous Daily  . feeding supplement (ENSURE ENLIVE)  237 mL Oral TID BM  . guaiFENesin  600 mg Oral BID  . insulin aspart  0-15 Units Subcutaneous TID WC  . insulin aspart  0-5 Units Subcutaneous QHS  . insulin aspart  2 Units Subcutaneous TID WC  . insulin glargine  10 Units Subcutaneous Daily  . memantine  10 mg Oral BID  . nystatin  5 mL Oral QID  . predniSONE  40 mg Oral Q breakfast  . senna  1 tablet Oral Daily  . sodium chloride flush  3 mL Intravenous Q12H  .  sodium chloride flush  3 mL Intravenous Q12H  . tamsulosin  0.4 mg Oral Daily   Continuous Infusions: . sodium chloride       LOS: 5 days    Time spent: 35 minutes    Glennice Marcos A Grahm Etsitty, MD [facility]   If 7PM-7AM, please contact night-coverage [email or web address] Password [facility] 05/14/2019, 2:41 PM

## 2019-05-14 NOTE — Progress Notes (Signed)
I followed up with spiritual care for [facility]. [name] (daughter) was at bedside. [name] recently finished her breakfast and welcome the Chaplain visit. [name] stated she was feeling ok. She said she is hoping to go home soon. [name], said that they are awaiting the plan with possible therapy before they can get a better picture on discharge.   I was able to offer space to voice concerns and questions. [name] said she had lots of family and friend support back home where her daughter is the primary care giver for quite some time. [name] mentioned she has 6 adult children that she loves dearly. She  mentioned her love for the [name] and is happy that her kids are also in good standing. She says she is in a good place and that whenever she was ready she is happy with where the [name] is taking her. I offered [name] and [name] spiritual care with words of comfort, prayer and ministry of presence. Both [name] and [name] seemed very appreciative for the staff and spiritual care given. Chaplain available as needed.   Palliative care Chaplain resident [name] [phone]

## 2019-05-15 DIAGNOSIS — I5033 Acute on chronic diastolic (congestive) heart failure: Secondary | ICD-10-CM

## 2019-05-15 DIAGNOSIS — R7401 Elevation of levels of liver transaminase levels: Secondary | ICD-10-CM

## 2019-05-15 DIAGNOSIS — G301 Alzheimer's disease with late onset: Secondary | ICD-10-CM

## 2019-05-15 DIAGNOSIS — E089 Diabetes mellitus due to underlying condition without complications: Secondary | ICD-10-CM

## 2019-05-15 DIAGNOSIS — F0280 Dementia in other diseases classified elsewhere without behavioral disturbance: Secondary | ICD-10-CM

## 2019-05-15 LAB — GLUCOSE, CAPILLARY
Glucose-Capillary: 84 mg/dL (ref 70–99)
Glucose-Capillary: 231 mg/dL — ABNORMAL HIGH (ref 70–99)

## 2019-05-15 MED ORDER — FUROSEMIDE 20 MG PO TABS
20.0000 mg | ORAL_TABLET | Freq: Every day | ORAL | Status: DC
  Administered 2019-05-15: 12:00:00 20 mg via ORAL
  Filled 2019-05-15: qty 1

## 2019-05-15 MED ORDER — CARVEDILOL 12.5 MG PO TABS: 12.5000 mg | ORAL_TABLET | Freq: Two times a day (BID) | ORAL | 2 refills | Status: DC

## 2019-05-15 MED ORDER — DOXYCYCLINE HYCLATE 100 MG PO TABS: 100.0000 mg | ORAL_TABLET | Freq: Two times a day (BID) | ORAL | 0 refills | Status: AC

## 2019-05-15 MED ORDER — PREDNISONE 10 MG PO TABS: ORAL_TABLET | ORAL | 0 refills | Status: DC

## 2019-05-15 NOTE — Consult Note (Addendum)
   [facility] CM Inpatient Consult   05/15/2019  ELLAGRACE YOSHIDA 1930/01/18 [id number]    Follow-Up Note:  Call received from Inpatient transition of care SW (A.Hill) regarding patient's daughter interest in [facility]. Was made aware that disposition plan is for comprehensive inpatient rehab (CIR) at [facility], but currently has no available bed for patient and possibility of discharge to home with home health, with daughter's preference for [facility] (pending acceptance).  Call made and spoke to patient's daughter [name]). HIPAA information verified. Patient's daughter understands that comprehensive inpatient rehab was recommended for patient but no bed availability.  Explained the [facility], music services as well as [facility] vs [facility] First Program. Daughter indicated patient's need is more for [facility] First Program at this time (uses stairs at home). Awaiting approval for [facility] First Program per inpatient SW.  Patient's daughter denies any needs for medication (daughter manages), pharmacy (uses [facility]-line for inhalers), transportation (provided by daughter). Patient lives with daughter (works), who is her primary caregiver at home. She states good understanding of patient's chronic health conditions and management of it. Daughter aware to follow-up and stay in contact with patient's primary care provider for any health needs or concerns that may arise.  Made inpatient transition of care team aware and address daughter's concern of patient's need for a rolling walker (has regular walker at home).    Plan: Continue to follow for disposition plan and post hospital care management needs for follow-up as appropriate.    Addendum:  Per transition of care SW note, patient does qualify for [facility] First program which will be initiated at discharge.  Rolling walker to be delivered to patient's room via  [employer].  Plan: Will notify [facility] CM staff of patient being accepted to the [facility].    For questions and additional information, please call:  Karin Golden A. Wallace Cogliano, BSN, RN-BC [facility] Liaison Cell: [phone]

## 2019-05-15 NOTE — Progress Notes (Signed)
Physical Therapy Treatment Patient Details Name: [name] MRN: [id number] DOB: Feb 17, 1930 Today's Date: 05/15/2019    History of Present Illness 86 year old with past medical history significant for hypertension, combined systolic and diastolic heart failure, A. 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    PT Comments    Pt tolerated treatment well, demonstrating improved activity tolerance with reduced physical assistance. Pt's daughter present and participating with chair follow. Pt with improved stability, however requires cues for device management. Pt is progressing well and will benefit from discharge home with HHPT services, a RW,and 24/7 assistance for all OOB activity from the pt's daughter. Pt will benefit from stair assessment next session.  Follow Up Recommendations  Home health PT;Supervision/Assistance - 24 hour     Equipment Recommendations  Rolling walker with 5" wheels(pt owns standard walker only)    Recommendations for Other Services       Precautions / Restrictions Precautions Precautions: Fall Restrictions Weight Bearing Restrictions: No    Mobility  Bed Mobility Overal bed mobility: (pt received and left in recliner)                Transfers Overall transfer level: Needs assistance Equipment used: Rolling walker (2 wheeled) Transfers: Sit to/from Stand Sit to Stand: Supervision            Ambulation/Gait Ambulation/Gait assistance: Min guard(minG progressing to close supervision) Gait Distance (Feet): 100 Feet Assistive device: Rolling walker (2 wheeled) Gait Pattern/deviations: Step-to pattern;Trunk flexed Gait velocity: reduced Gait velocity interpretation: 1.31 - 2.62 ft/sec, indicative of limited community ambulator General Gait Details: PT  providing verbal cues to maintain BOS within RW, pt requires intermittent cueing for this due to cognitive deficits   Stairs             Wheelchair Mobility    Modified Rankin (Stroke Patients Only)       Balance Overall balance assessment: Needs assistance Sitting-balance support: No upper extremity supported;Feet supported Sitting balance-Leahy Scale: Good Sitting balance - Comments: supervision   Standing balance support: Bilateral upper extremity supported Standing balance-Leahy Scale: Fair Standing balance comment: minG-close supervision with BUE support of RW                            Cognition Arousal/Alertness: Awake/alert Behavior During Therapy: WFL for tasks assessed/performed Overall Cognitive Status: History of cognitive impairments - at baseline                                 General Comments: HOH but overall following commands well       Exercises      General Comments General comments (skin integrity, edema, etc.): VSS on RA, no reliable pleth reading but pt denies SOB while on 2L [place], able to converse some during ambulation      Pertinent Vitals/Pain Pain Assessment: No/denies pain    Home Living                      Prior Function            PT Goals (current goals can now be found in the care plan section) Acute Rehab PT Goals Patient Stated Goal: To improve mobility and return home Progress  towards PT goals: Progressing toward goals    Frequency    Min 3X/week      PT Plan Current plan remains appropriate    Co-evaluation              AM-PAC PT "6 Clicks" Mobility   Outcome Measure  Help needed turning from your back to your side while in a flat bed without using bedrails?: None Help needed moving from lying on your back to sitting on the side of a flat bed without using bedrails?: None Help needed moving to and from a bed to a chair (including a wheelchair)?: A Little Help needed  standing up from a chair using your arms (e.g., wheelchair or bedside chair)?: A Little Help needed to walk in hospital room?: A Little Help needed climbing 3-5 steps with a railing? : A Lot 6 Click Score: 19    End of Session Equipment Utilized During Treatment: Oxygen Activity Tolerance: Patient tolerated treatment well Patient left: in chair;with call bell/phone within reach;with chair alarm set;with family/visitor present Nurse Communication: Mobility status PT Visit Diagnosis: Unsteadiness on feet (R26.81);Muscle weakness (generalized) (M62.81)     Time: 5284-1324 PT Time Calculation (min) (ACUTE ONLY): 25 min  Charges:  $Gait Training: 23-37 mins                     Arlyss Gandy, PT, DPT Acute Rehabilitation Pager: [phone]    Arlyss Gandy 05/15/2019, 1:54 PM

## 2019-05-15 NOTE — TOC Initial Note (Addendum)
Transition of Care [facility]) - Initial/Assessment Note    Patient Details  Name: [name] MRN: [id number] Date of Birth: 20-Sep-1929  Transition of Care [facility]) CM/SW Contact:    Gildardo Griffes, [place] Phone Number: [phone] 05/15/2019, 1:37 PM  Clinical Narrative:                   Update: Kandee Keen with [facility] reports patient does qualify for [facility] First program. Will initiate at discharge. Rolling walker to be delivered to room via [employer]. Patient's daughter reports she will be taking home at time of discharge.No further needs identified at this time.   CSW spoke with patient's daughter [name] who reports being in agreement with patient going home with home health due to CIR having no beds avail. She reports preference for [facility] First program and remote health through [facility], would also need DME rolling walker.   CSW has reached out to [facility] with [facility] for review of chart to see if patient qualifies for home first, pending acceptance at this time.   CSW has reached out to [place] with [facility] regarding the remote health requested, she will follow up.   CSW has reached out to [place] with [employer] for DME rolling walker to be delivered to room  Expected Discharge Plan: Home w Home Health Services Barriers to Discharge: No Barriers Identified   Patient Goals and CMS Choice   CMS Medicare.gov Compare Post Acute Care list provided to:: Patient Represenative (must comment)([name]) Choice offered to / list presented to : Adult Children  Expected Discharge Plan and Services Expected Discharge Plan: Home w Home Health Services     Post Acute Care Choice: Home Health Living arrangements for the past 2 months: Single Family Home                 DME Arranged: Walker rolling DME Agency: [employer] Date DME Agency Contacted: 05/15/19 Time DME Agency Contacted: [phone] Representative spoke with at DME Agency: Zack HH Arranged: PT, OT, RN, Nurse's Aide, Social Work [employer] Agency: [employer] [facility] Date [facility] Agency Contacted: 05/15/19 Time HH Agency Contacted: [phone] Representative spoke with at [facility] Agency: Kandee Keen  Prior Living Arrangements/Services Living arrangements for the past 2 months: Single Family Home Lives with:: Self Patient language and need for interpreter reviewed:: Yes Do you feel safe going back to the place where you live?: Yes      Need for Family Participation in Patient Care: Yes (Comment) Care giver support system in place?: Yes (comment)   Criminal Activity/Legal Involvement Pertinent to Current Situation/Hospitalization: No - Comment as needed  Activities of Daily Living Home Assistive Devices/Equipment: Bedside commode/3-in-1, Blood pressure cuff, CBG Meter, Grab bars in shower, Nebulizer, Oxygen, Walker (specify type), Shower chair with back, Hospital bed, Eyeglasses ADL Screening (condition at time of admission) Patient's cognitive ability adequate to safely complete daily activities?: Yes Is the patient deaf or have difficulty hearing?: No Does the patient have difficulty seeing, even when wearing glasses/contacts?: No Does the patient have difficulty concentrating, remembering, or making decisions?: Yes Patient able to express need for assistance with ADLs?: Yes Does the patient have difficulty dressing or bathing?: Yes Independently performs ADLs?: No Communication: Dependent Is this a change from baseline?: Pre-admission baseline Dressing (OT): Needs assistance Is this a change from baseline?: Pre-admission baseline Grooming: Dependent Is this a change from baseline?: Pre-admission baseline Feeding: Needs assistance Is this a change from baseline?: Pre-admission baseline Bathing: Dependent Is this a change from baseline?:  Pre-admission baseline Toileting: Dependent Is this a change from baseline?: Pre-admission baseline In/Out Bed: Dependent, Needs assistance Is this a change from baseline?: Pre-admission baseline Walks in Home: Needs  assistance Is this a change from baseline?: Pre-admission baseline Does the patient have difficulty walking or climbing stairs?: Yes Weakness of Legs: Both Weakness of Arms/Hands: Both  Permission Sought/Granted Permission sought to share information with : Case Manager, Customer service manager, Family Supports Permission granted to share information with : Yes, Verbal Permission Granted  Share Information with NAME: [name]  Permission granted to share info w AGENCY: Home Health/[facility]  Permission granted to share info w Relationship: daughter  Permission granted to share info w Contact Information: [phone]  Emotional Assessment Appearance:: Appears stated age Attitude/Demeanor/Rapport: Unable to Assess Affect (typically observed): Unable to Assess Orientation: : Oriented to Place, Oriented to Self Alcohol / Substance Use: Not Applicable Psych Involvement: No (comment)  Admission diagnosis:  Elevated troponin [R77.8] Acute on chronic diastolic congestive heart failure (HCC) [I50.33] Atrial fibrillation with RVR ([place]) [I48.91] Patient Active Problem List   Diagnosis Date Noted  . Palliative care by specialist   . Delirium   . DNR (do not resuscitate)   . Muscular weakness   . Spiritual or religious counseling   . Atrial fibrillation with RVR ([place]) 05/09/2019  . 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 Transaminitis 05/09/2019  . Alzheimer disease ([place]) 06/04/2018  . COPD with acute exacerbation ([place]) 10/05/2017  . COPD mixed type ([place]) 08/15/2016  . Weakness 08/15/2016  . Protein-calorie malnutrition, severe 07/28/2016  . Pressure injury of skin 07/27/2016  . Chronic obstructive pulmonary disease ([place]) 07/26/2016  . Acute on chronic respiratory failure with hypoxia ([place]) 07/26/2016  . Essential hypertension 07/26/2016  . Dyslipidemia associated with type 2 diabetes mellitus ([place]) 07/26/2016  . Diabetes mellitus without complication,  without long-term current use of insulin ([place]) 07/26/2016  . Acute bronchitis 07/26/2016  . Underweight 05/27/2015  . Chronic diastolic heart failure ([place]) 11/24/2011   PCP:  Lujean Amel, MD Pharmacy:   [facility] [address] [place] [address] Phone: [phone] Fax: [place], [place] [id number] Phone: [phone] Fax: [phone]     Social Determinants of Health (SDOH) Interventions    Readmission Risk Interventions No flowsheet data found.

## 2019-05-15 NOTE — Progress Notes (Signed)
Nutrition Follow-up  RD working remotely.  DOCUMENTATION CODES:   Underweight, Severe malnutrition in context of chronic illness  INTERVENTION:   - Ensure Enlive po TID, each supplement provides 350 kcal and 20 grams of protein  - Encourage adequate PO intake  NUTRITION DIAGNOSIS:   Severe Malnutrition related to chronic illness (COPD, CHF, Alzheimer's) as evidenced by severe fat depletion, severe muscle depletion.  Ongoing, being addressed via supplements  GOAL:   Patient will meet greater than or equal to 90% of their needs  Progressing  MONITOR:   PO intake, Supplement acceptance, Labs, Weight trends  REASON FOR ASSESSMENT:   Other (low BMI)    ASSESSMENT:   86 year old female who presented to the ED on 1/14 with SOB. PMH of CHF, HTN, T2DM, COPD, atrial fibrillation, Alzheimer's disease.  Pt will either d/c to CIR or home with home health.  Pt accepting most Ensure Enlive supplements per MAR.  Reviewed Palliative note. Pt is now DNR/DNI. No artificial feeding.  Weight yesterday consistent with admit weight.  Unable to reach pt via phone call to room. Busy signal received on all attempts.  Will continue with current nutrition interventions.  Meal Completion: 10-80% x last 6 recorded meals  Medications reviewed and include: Ensure Enlive TID, Lasix, SSI, Novolog 2 units TID, Lantus 10 units daily, nystatin, prednisone, senna  Labs reviewed. CBG's: 84-231 x 24 hours  UOP: 1450 ml x 24 hours  Diet Order:   Diet Order            Diet Carb Modified Fluid consistency: Thin; Room service appropriate? Yes  Diet effective now              EDUCATION NEEDS:   Education needs have been addressed  Skin:  Skin Assessment: Reviewed RN Assessment  Last BM:  05/13/19  Height:   Ht Readings from Last 1 Encounters:  05/09/19 5' (1.524 m)    Weight:   Wt Readings from Last 1 Encounters:  05/14/19 40.6 kg    Ideal Body Weight:  45.5 kg  BMI:   Body mass index is 17.48 kg/m.  Estimated Nutritional Needs:   Kcal:  1200-1400  Protein:  55-70 grams  Fluid:  1.2-1.4 L    Earma Reading, MS, RD, LDN Inpatient Clinical Dietitian Pager: [phone] Weekend/After Hours: [phone]

## 2019-05-15 NOTE — Progress Notes (Signed)
Subjective:  Reports dyspnea is improving  Objective:  Vitals:   05/15/19 0000 05/15/19 0349 05/15/19 0732 05/15/19 0822  BP:  (!) 120/55 (!) 111/48   Pulse:  (!) 58 64 (!) 59  Resp:  14 (!) 23 16  Temp: (!) 97.5 F (36.4 C) 97.7 F (36.5 C) 98 F (36.7 C)   TempSrc: Axillary Oral Oral   SpO2:  100% 99% 99%  Weight:      Height:        Intake/Output from previous day:  Intake/Output Summary (Last 24 hours) at 05/15/2019 1037 Last data filed at 05/15/2019 0900 Gross per 24 hour  Intake 975 ml  Output 1450 ml  Net -475 ml    Physical Exam: Affect appropriate Frail elderly black female  HEENT: normal Neck supple with no adenopathy JVP elevated  no bruits no thyromegaly Scattered bibasilar crackles  Heart:  S1/S2 MR  murmur, no rub, gallop or click PMI normal Abdomen: benighn, BS positve, no tenderness, no AAA no bruit.  No HSM or HJR Distal pulses intact with no bruits No edema Neuro non-focal Skin warm and dry No muscular weakness   Lab Results: Basic Metabolic Panel: Recent Labs    05/14/19 0236  NA 143  K 4.2  CL 103  CO2 32  GLUCOSE 84  BUN 20  CREATININE 0.59  CALCIUM 8.9   Liver Function Tests: No results for input(s): AST, ALT, ALKPHOS, BILITOT, PROT, ALBUMIN in the last 72 hours. No results for input(s): LIPASE, AMYLASE in the last 72 hours. CBC: No results for input(s): WBC, NEUTROABS, HGB, HCT, MCV, PLT in the last 72 hours.  No results for input(s): HGBA1C in the last 72 hours. Fasting Lipid Panel: No results for input(s): CHOL, HDL, LDLCALC, TRIG, CHOLHDL, LDLDIRECT in the last 72 hours. Thyroid Function Tests: No results for input(s): TSH, T4TOTAL, T3FREE, THYROIDAB in the last 72 hours.  Invalid input(s): FREET3 Anemia Panel: No results for input(s): VITAMINB12, FOLATE, FERRITIN, TIBC, IRON, RETICCTPCT in the last 72 hours.  Imaging: No results found.  Cardiac Studies:  ECG: afib rate 161 chronic RBBB.  No new ECG     Telemetry: NSR rates 60s.  PVCs Medications:   . arformoterol  15 mcg Nebulization BID  . aspirin EC  81 mg Oral Daily  . atorvastatin  10 mg Oral q1800  . benazepril  10 mg Oral BID  . budesonide (PULMICORT) nebulizer solution  0.5 mg Nebulization BID  . carvedilol  12.5 mg Oral BID WC  . Chlorhexidine Gluconate Cloth  6 each Topical Daily  . digoxin  0.0625 mg Oral Daily  . doxycycline  100 mg Oral Q12H  . enoxaparin (LOVENOX) injection  40 mg Subcutaneous Daily  . feeding supplement (ENSURE ENLIVE)  237 mL Oral TID BM  . guaiFENesin  600 mg Oral BID  . insulin aspart  0-15 Units Subcutaneous TID WC  . insulin aspart  0-5 Units Subcutaneous QHS  . insulin aspart  2 Units Subcutaneous TID WC  . insulin glargine  10 Units Subcutaneous Daily  . memantine  10 mg Oral BID  . nystatin  5 mL Oral QID  . predniSONE  40 mg Oral Q breakfast  . senna  1 tablet Oral Daily  . sodium chloride flush  3 mL Intravenous Q12H  . sodium chloride flush  3 mL Intravenous Q12H  . tamsulosin  0.4 mg Oral Daily     . sodium chloride  Assessment/Plan:  ANGLE DIRUSSO is a 86 y.o. female with a hx of systolic and diastolic CHF, HTN, DM2, COPD on home O2 (follows with pulmonary medicine Dr Tonia Brooms ) with ongoing tobacco abuse and atrial fibrillation not on anticoagulation who is being seen for the evaluation of PAF and CHF     1. Afib : converted to NSR.  She has been on carvedilol and digoxin as outpatient.  She has not been anticoagulated in past due to extreme low body weight and fall risk.  TTE shows EF 30-35%, severe MR - Continue carvedilol, digoxin.  Stopped diltiazem given systolic dysfunction.  2. CHF:  Echo shows severe MR which is new since 2018 and EF 30-35%.  Given age and weight not a candidate for mitral clip. Diuretics held due to azotemia, which has resolved.  Continue digoxin and will restart benzapril as renal function has normalized and BP has been elevated.  Hopefully degree of  MR will be less as her COPD improves and now that she is not in rapid afib will repeat echo in 6 weeks in office  -Does have some bibasilar crackles, would restart home PO lasix today.  Would check BMET today to ensure stable renal function  3. COPD:  On home oxygen, quit smoking 2 years ago.  No wheezing  on exam.  Steroids and nebs per primary service  4. Frequent PVCs/NSVT: continue coreg, increased to 12.5 mg BID    Little Ishikawa 05/15/2019, 10:37 AM

## 2019-05-15 NOTE — Care Management Important Message (Signed)
Important Message  Patient Details  Name: [name] MRN: [id number] Date of Birth: 08/06/1929   Medicare Important Message Given:  Yes     Renie Ora 05/15/2019, 2:57 PM

## 2019-05-15 NOTE — Plan of Care (Signed)

## 2019-05-15 NOTE — Progress Notes (Signed)
Discharged home accompanied by daughter, discharged instructions  and prescription  given . Belongings and walker taken home.

## 2019-05-15 NOTE — Plan of Care (Signed)
  Problem: Education: Goal: Knowledge of General Education information will improve Description: Including pain rating scale, medication(s)/side effects and non-pharmacologic comfort measures Outcome: Adequate for Discharge   Problem: Health Behavior/Discharge Planning: Goal: Ability to manage health-related needs will improve Outcome: Adequate for Discharge   Problem: Clinical Measurements: Goal: Ability to maintain clinical measurements within normal limits will improve Outcome: Adequate for Discharge Goal: Will remain free from infection Outcome: Adequate for Discharge Goal: Diagnostic test results will improve Outcome: Adequate for Discharge Goal: Respiratory complications will improve Outcome: Adequate for Discharge Goal: Cardiovascular complication will be avoided Outcome: Adequate for Discharge   Problem: Activity: Goal: Risk for activity intolerance will decrease Outcome: Adequate for Discharge   Problem: Nutrition: Goal: Adequate nutrition will be maintained Outcome: Adequate for Discharge   Problem: Coping: Goal: Level of anxiety will decrease Outcome: Adequate for Discharge   Problem: Elimination: Goal: Will not experience complications related to bowel motility Outcome: Adequate for Discharge Goal: Will not experience complications related to urinary retention Outcome: Adequate for Discharge   Problem: Pain Managment: Goal: General experience of comfort will improve Outcome: Adequate for Discharge   Problem: Safety: Goal: Ability to remain free from injury will improve Outcome: Adequate for Discharge   Problem: Skin Integrity: Goal: Risk for impaired skin integrity will decrease Outcome: Adequate for Discharge   Discharge instructions given to patient. Questions answered. Educated on new medication regimen, signs/symptoms, restrictions, and follow up appointments. Prescriptions [facility] and printed prednisone. PIV DC, hemostasis achieved. Vital signs  stable. All belongings sent home with patient.   Pt escorted by NT and daughter via wheelchair to private vehicle driven by daughter.   Delayed d/t waiting on walker to bedside for home use.

## 2019-05-15 NOTE — Progress Notes (Signed)
Inpatient Rehabilitation Admissions Coordinator  I met with patient and her daughter, Romana, at bedside. I do not have a bed available for this patient at bedside and they are aware. Daughter does not want SNF. She prefers home with HH if CIR bed does not come available. I will alert RN CM, Deborah and Ashley, SW. Romano would like to speak to d/c planner today.   , RN, MSN Rehab Admissions Coordinator [phone] 05/15/2019 11:43 AM  

## 2019-05-15 NOTE — Discharge Summary (Signed)
Physician Discharge Summary  CHISTINE DEMATTEO VOH:[id number] DOB: 12/05/1929 DOA: 05/09/2019  PCP: Lujean Amel, MD  Admit date: 05/09/2019 Discharge date: 05/15/2019  Time spent: 50* minutes  Recommendations for Outpatient Follow-up:  1. Follow-up PCP in 2 weeks   Discharge Diagnoses:  Principal Problem:   Acute on chronic respiratory failure with hypoxia (HCC) Active Problems:   Diabetes mellitus without complication, without long-term current use of insulin (HCC)   COPD with acute exacerbation (HCC)   Alzheimer disease (HCC)   Atrial fibrillation with RVR (HCC)   Combined systolic and diastolic cardiac dysfunction   Transaminitis   Palliative care by specialist   Delirium   DNR (do not resuscitate)   Muscular weakness   Spiritual or religious counseling   Discharge Condition: Stable  Diet recommendation: Heart healthy diet  Filed Weights   05/12/19 0404 05/13/19 0354 05/14/19 0337  Weight: 43.7 kg 43.8 kg 40.6 kg    History of present illness:  86 year old with past medical history significant for hypertension, combined systolic and diastolic heart failure, A. 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  Patient wake up her daughter at 4:30 AM stating that she is not able to breath.  Shortness of breath did not improve with breathing treatments.  Patient presented to the ED for further evaluation. In ED patient was found to be in A. fib RVR, heart rate up to 160, she was tachypneic, chest x-ray showed mild cardiomegaly without signs of edema.  Patient received IV Solu-Medrol, IV Lasix and magnesium sulfate.  Patient admitted and managed for COPD exacerbation and A. fib with RVR.  Patient has remained stable and awaiting CIR admission.   Hospital Course:   Acute on chronic hypoxic  respiratory failure-secondary to COPD exacerbation, A. fib with RVR.  Patient was treated with IV Solu-Medrol, Brovana, budesonide, albuterol.  Pulmonology was consulted.  Started on prednisone taper.  Patient to go home with home health PT/OT.  Will discharge on prednisone taper for 5 more days.  Paroxysmal atrial fibrillation with RVR-patient came with heart rate of 160s.  She is on aspirin, not on anticoagulation due to high risk for fall.  She was started on IV Cardizem drip and subsequently changed to oral Cardizem.  Cardizem was stopped due to low ejection fraction.  Patient is currently on digoxin.  Echo showed severe mitral valve regurgitation EF 35%, hypokinesis.  Cardiology saw the patient, she is not a candidate for mitral clip.  Started on Coreg for PVCs  Combined systolic and diastolic CHF-BNP is elevated 1267, IV Lasix was on hold.  P.o. Lasix has been restarted per cardiology.  Diabetes mellitus type 2-continue sliding scale insulin, Metformin at home.  Hypertension-blood pressure stable on Coreg.  Alzheimer's dementia-mild, continue Namenda.  Acute kidney injury-improved with IV fluids.  Cardiology has restarted benazepril, Lasix 20 mg daily.  Urine retention-resolved, patient started on Flomax.  Transaminitis-mild AST and ALT were 44/52 on 05/11/2019.  Asymptomatic.  Needs to follow-up LFTs as outpatient.  Procedures:  Echocardiogram  Consultations: Cardiology Pulmonary Palliative care Discharge Exam: Vitals:   05/15/19 1134 05/15/19 1510  BP: (!) 152/67 139/61  Pulse: 79 67  Resp: 18 19  Temp: 98.2 F (36.8 C) 98.1 F (36.7 C)  SpO2:  99%    General: Appears in no acute distress Cardiovascular: S1-S2, regular, no murmur auscultated Respiratory:  Abdomen is soft, nontender, no organomegaly  Discharge Instructions   Discharge Instructions    Diet - low sodium heart healthy   Complete by: As directed    Increase activity slowly   Complete by: As directed       Allergies as of 05/15/2019   No Known Allergies     Medication List    TAKE these medications   AeroChamber MV inhaler Use as instructed   albuterol (2.5 MG/3ML) 0.083% nebulizer solution Commonly known as: PROVENTIL Take 3 mLs (2.5 mg total) by nebulization every 4 (four) hours as needed for wheezing or shortness of breath.   albuterol 108 (90 Base) MCG/ACT inhaler Commonly known as: VENTOLIN HFA Inhale 2 puffs into the lungs every 6 (six) hours as needed for wheezing or shortness of breath.   aspirin EC 81 MG tablet Take 81 mg by mouth daily.   atorvastatin 10 MG tablet Commonly known as: LIPITOR TAKE 1 TABLET(10 MG) BY MOUTH DAILY AT 6 PM What changed: See the new instructions.   benazepril 10 MG tablet Commonly known as: LOTENSIN Take 10 mg by mouth 2 (two) times daily.   budesonide-formoterol 160-4.5 MCG/ACT inhaler Commonly known as: Symbicort INHALE 2 PUFFS INTO THE LUNGS TWICE DAILY   carvedilol 12.5 MG tablet Commonly known as: COREG Take 1 tablet (12.5 mg total) by mouth 2 (two) times daily with a meal. What changed:   medication strength  how much to take   digoxin 0.125 MG tablet Commonly known as: LANOXIN TAKE 1/2 TABLET BY MOUTH EVERY DAY. PLEASE KEEP APPT IN SEPT FOR FUTURE REFILLS What changed: See the new instructions.   doxycycline 100 MG tablet Commonly known as: VIBRA-TABS Take 1 tablet (100 mg total) by mouth every 12 (twelve) hours for 2 days.   feeding supplement Liqd Take 1 Container by mouth as needed (between meal).   ferrous sulfate 325 (65 FE) MG tablet Take 325 mg by mouth daily with breakfast.   fluticasone 50 MCG/ACT nasal spray Commonly known as: FLONASE Place 1 spray into both nostrils daily as needed for allergies.   furosemide 20 MG tablet Commonly known as: LASIX TAKE 1 TABLET BY MOUTH ONCE DAILY   guaiFENesin 600 MG 12 hr tablet Commonly known as: MUCINEX Take 1 tablet (600 mg total) by mouth 2 (two) times  daily.   Incruse Ellipta 62.5 MCG/INH Aepb Generic drug: umeclidinium bromide INHALE 1 PUFF INTO THE LUNGS DAILY   memantine 10 MG tablet Commonly known as: NAMENDA TAKE 1 TABLET(10 MG) BY MOUTH TWICE DAILY What changed: See the new instructions.   metFORMIN 500 MG 24 hr tablet Commonly known as: GLUCOPHAGE-XR Take 1,000 mg by mouth 2 (two) times daily.   MV-ONE PO Take 1 tablet by mouth daily.   NovoLOG 100 UNIT/ML injection Generic drug: insulin aspart Inject into the skin daily as needed for high blood sugar (For CBG >300). As needed   nystatin 100000 UNIT/ML suspension Commonly known as: MYCOSTATIN Take 5 mLs (500,000 Units total) by mouth 4 (four) times daily.   predniSONE 10 MG tablet Commonly known as: DELTASONE Prednisone 40 mg po daily x 1 day then Prednisone 30 mg po daily x 1 day then Prednisone 20 mg po daily x 1 day then Prednisone 10 mg daily x 1 day then stop...   PROBIOTIC ACIDOPHILUS PO Take 1 capsule by mouth daily.   tamsulosin 0.4 MG Caps capsule Commonly known as: FLOMAX Take 1 capsule (0.4 mg total) by mouth daily.  Tiotropium Bromide Monohydrate 2.5 MCG/ACT Aers Commonly known as: Spiriva Respimat Inhale 2 puffs into the lungs daily.   vitamin B-12 500 MCG tablet Commonly known as: CYANOCOBALAMIN Take 1,000 mcg by mouth daily.            Durable Medical Equipment  (From admission, onward)         Start     Ordered   05/15/19 1343  For home use only DME Walker rolling  Once    Question Answer Comment  Walker: With 5 Inch Wheels   Patient needs a walker to treat with the following condition CHF (congestive heart failure) (HCC)      05/15/19 1342         No Known Allergies Follow-up Information    [facility], [facility] Follow up.   Specialty: Home Health Services Why: Frances Furbish will reach out to schedule home health therapies. Contact information: [address] [place] [address] [phone]             The results of significant diagnostics from this hospitalization (including imaging, microbiology, ancillary and laboratory) are listed below for reference.    Significant Diagnostic Studies: DG Chest Portable 1 View  Result Date: 05/09/2019 CLINICAL DATA:  Shortness of breath EXAM: PORTABLE CHEST 1 VIEW COMPARISON:  June 15, 2018 FINDINGS: There is mild cardiomegaly. Aortic knob calcifications. Slight hyperinflation of the upper lung zones. Both lungs are clear. The visualized skeletal structures are unremarkable. IMPRESSION: No active disease. Electronically Signed   By: Jonna Clark M.D.   On: 05/09/2019 06:34   ECHOCARDIOGRAM COMPLETE  Result Date: 05/11/2019   ECHOCARDIOGRAM REPORT   Patient Name:   MARYBELLA ETHIER Date of Exam: 05/09/2019 Medical Rec #:  [id number]       Height:       60.0 in Accession #:    [id number]      Weight:       103.0 lb Date of Birth:  06/06/1929       BSA:          1.41 m Patient Age:    89 years        BP:           110/60 mmHg Patient Gender: F               HR:           101 bpm. Exam Location:  Inpatient Procedure: 2D Echo Indications:    CHF  History:        Patient has prior history of Echocardiogram examinations. CHF.  Sonographer:    Leeroy Bock Turrentine Referring Phys: [id number] RONDELL A SMITH IMPRESSIONS  1. Left ventricular ejection fraction, by visual estimation, is 30 to 35%. The left ventricle has moderate to severely decreased function. There is no left ventricular hypertrophy.  2. Moderately dilated left ventricular internal cavity size.  3. The left ventricle demonstrates global hypokinesis.  4. Diffuse hypokinesis worse in inferior wall.  5. Global right ventricle has normal systolic function.The right ventricular size is normal. No increase in right ventricular wall thickness.  6. Left atrial size was moderately dilated.  7. Right atrial size was mildly dilated.  8. Mild mitral annular calcification.  9. The mitral valve is normal in structure.  Severe mitral valve regurgitation. 10. The tricuspid valve is normal in structure. 11. The aortic valve is tricuspid. Aortic valve regurgitation is trivial. Mild aortic valve sclerosis without stenosis. 12. Pulmonic regurgitation is mild. 13.  The pulmonic valve was grossly normal. Pulmonic valve regurgitation is mild. 14. Moderately elevated pulmonary artery systolic pressure. FINDINGS  Left Ventricle: Left ventricular ejection fraction, by visual estimation, is 30 to 35%. The left ventricle has moderate to severely decreased function. The left ventricle demonstrates global hypokinesis. The left ventricular internal cavity size was moderately dilated left ventricle. There is no left ventricular hypertrophy. Diffuse hypokinesis worse in inferior wall. Right Ventricle: The right ventricular size is normal. No increase in right ventricular wall thickness. Global RV systolic function is has normal systolic function. The tricuspid regurgitant velocity is 3.36 m/s, and with an assumed right atrial pressure  of 10 mmHg, the estimated right ventricular systolic pressure is moderately elevated at 55.2 mmHg. Left Atrium: Left atrial size was moderately dilated. Right Atrium: Right atrial size was mildly dilated Pericardium: There is no evidence of pericardial effusion. Mitral Valve: The mitral valve is normal in structure. There is mild thickening of the mitral valve leaflet(s). There is mild calcification of the mitral valve leaflet(s). Mild mitral annular calcification. Severe mitral valve regurgitation. Tricuspid Valve: The tricuspid valve is normal in structure. Tricuspid valve regurgitation is mild. Aortic Valve: The aortic valve is tricuspid. Aortic valve regurgitation is trivial. Mild aortic valve sclerosis is present, with no evidence of aortic valve stenosis. Pulmonic Valve: The pulmonic valve was grossly normal. Pulmonic valve regurgitation is mild. Pulmonic regurgitation is mild. Aorta: The aortic root is normal in  size and structure. IAS/Shunts: No atrial level shunt detected by color flow Doppler.  LEFT VENTRICLE PLAX 2D LVIDd:         6.20 cm LVIDs:         5.20 cm LV PW:         0.70 cm LV IVS:        0.60 cm LVOT diam:     2.00 cm LV SV:         64 ml LV SV Index:   45.84 LVOT Area:     3.14 cm  LV Volumes (MOD) LV area d, A2C:    28.70 cm LV area d, A4C:    25.90 cm LV area s, A2C:    23.80 cm LV area s, A4C:    19.50 cm LV major d, A2C:   6.87 cm LV major d, A4C:   6.53 cm LV major s, A2C:   6.15 cm LV major s, A4C:   6.11 cm LV vol d, MOD A2C: 99.7 ml LV vol d, MOD A4C: 86.3 ml LV vol s, MOD A2C: 75.2 ml LV vol s, MOD A4C: 53.0 ml LV SV MOD A2C:     24.5 ml LV SV MOD A4C:     86.3 ml LV SV MOD BP:      32.1 ml RIGHT VENTRICLE RV S prime:     6.64 cm/s LEFT ATRIUM              Index LA diam:        4.40 cm  3.13 cm/m LA Vol (A2C):   101.0 ml 71.74 ml/m LA Vol (A4C):   51.5 ml  36.58 ml/m LA Biplane Vol: 73.6 ml  52.28 ml/m  AORTIC VALVE LVOT Vmax:   47.90 cm/s LVOT Vmean:  32.100 cm/s LVOT VTI:    0.060 m  AORTA Ao Root diam: 2.70 cm MITRAL VALVE                        TRICUSPID VALVE MV  Area (PHT): 4.80 cm             TR Peak grad:   45.2 mmHg MV PHT:        45.82 msec           TR Vmax:        336.00 cm/s MV Decel Time: 158 msec MR Peak grad:    114.3 mmHg         SHUNTS MR Mean grad:    68.0 mmHg          Systemic VTI:  0.06 m MR Vmax:         534.50 cm/s        Systemic Diam: 2.00 cm MR Vmean:        385.0 cm/s MR PISA:         3.08 cm MR PISA Eff ROA: 35 mm MR PISA Radius:  0.70 cm MV E velocity: 117.00 cm/s 103 cm/s  [name] MD Electronically signed by [name] MD Signature Date/Time: 05/11/2019/9:50:35 AM    Final     Microbiology: Recent Results (from the past 240 hour(s))  SARS CORONAVIRUS 2 (TAT 6-24 HRS) Nasopharyngeal Nasopharyngeal Swab     Status: None   Collection Time: 05/09/19  6:56 AM   Specimen: Nasopharyngeal Swab  Result Value Ref Range Status   SARS Coronavirus 2  NEGATIVE NEGATIVE Final    Comment: (NOTE) SARS-CoV-2 target nucleic acids are NOT DETECTED. The SARS-CoV-2 RNA is generally detectable in upper and lower respiratory specimens during the acute phase of infection. Negative results do not preclude SARS-CoV-2 infection, do not rule out co-infections with other pathogens, and should not be used as the sole basis for treatment or other patient management decisions. Negative results must be combined with clinical observations, patient history, and epidemiological information. The expected result is Negative. Fact Sheet for Patients: HairSlick.no Fact Sheet for Healthcare Providers: quierodirigir.com This test is not yet approved or cleared by the [place] FDA and  has been authorized for detection and/or diagnosis of SARS-CoV-2 by FDA under an Emergency Use Authorization (EUA). This EUA will remain  in effect (meaning this test can be used) for the duration of the COVID-19 declaration under Section 56 4(b)(1) of the Act, 21 U.S.C. section 360bbb-3(b)(1), unless the authorization is terminated or revoked sooner. Performed at [facility], [address] [place], [place] [address]   MRSA PCR Screening     Status: None   Collection Time: 05/09/19 10:30 PM   Specimen: Nasopharyngeal  Result Value Ref Range Status   MRSA by PCR NEGATIVE NEGATIVE Final    Comment:        The GeneXpert MRSA Assay (FDA approved for NASAL specimens only), is one component of a comprehensive MRSA colonization surveillance program. It is not intended to diagnose MRSA infection nor to guide or monitor treatment for MRSA infections. Performed at [facility], [address], [place], [place] [address]      Labs: Basic Metabolic Panel: Recent Labs  Lab 05/10/19 0202 05/10/19 0952 05/11/19 0916 05/12/19 0219 05/14/19 0236  NA 138 142 142 138 143  K 5.1 5.1 4.4 4.5 4.2  CL 98 104  103 102 103  CO2 32  GLUCOSE 343* 125* 219* 199* 84  BUN 29* 34* 28* 31* 20  CREATININE 1.25* 1.57* 0.93 0.69 0.59  CALCIUM 8.8* 8.8* 9.0 8.7* 8.9  MG 2.2  --   --   --   --  Liver Function Tests: Recent Labs  Lab 05/09/19 0627 05/10/19 0202 05/11/19 0916  AST 52* 57* 44*  ALT 52* 53* 52*  ALKPHOS 45 44 46  BILITOT 1.0 0.7 0.7  PROT 7.3 7.4 7.6  ALBUMIN 4.0 4.1 3.8   No results for input(s): LIPASE, AMYLASE in the last 168 hours. No results for input(s): AMMONIA in the last 168 hours. CBC: Recent Labs  Lab 05/09/19 0627 05/10/19 0202 05/11/19 0916 05/12/19 0219  WBC 14.7* 13.3* 13.0* 11.7*  HGB 11.6* 11.4* 12.2 10.1*  HCT 37.9 38.3 38.8 32.3*  MCV 103.3* 102.4* 97.7 97.9  PLT 176 179 153 134*   BNP: BNP (last 3 results) Recent Labs    05/09/19 0627  BNP 1,267.3*     CBG: Recent Labs  Lab 05/14/19 1126 05/14/19 1623 05/14/19 2107 05/15/19 0607 05/15/19 1134  GLUCAP 332* 194* 125* 84 231*       Signed:  Meredeth Ide MD.  [facility] 05/15/2019, 3:45 PM

## 2019-05-18 DIAGNOSIS — G309 Alzheimer's disease, unspecified: Secondary | ICD-10-CM | POA: Diagnosis not present

## 2019-05-18 DIAGNOSIS — I110 Hypertensive heart disease with heart failure: Secondary | ICD-10-CM | POA: Diagnosis not present

## 2019-05-18 DIAGNOSIS — I088 Other rheumatic multiple valve diseases: Secondary | ICD-10-CM | POA: Diagnosis not present

## 2019-05-18 DIAGNOSIS — I10 Essential (primary) hypertension: Secondary | ICD-10-CM | POA: Diagnosis not present

## 2019-05-18 DIAGNOSIS — J439 Emphysema, unspecified: Secondary | ICD-10-CM | POA: Diagnosis not present

## 2019-05-18 DIAGNOSIS — Z79899 Other long term (current) drug therapy: Secondary | ICD-10-CM | POA: Diagnosis not present

## 2019-05-18 DIAGNOSIS — E119 Type 2 diabetes mellitus without complications: Secondary | ICD-10-CM | POA: Diagnosis not present

## 2019-05-18 DIAGNOSIS — Z9181 History of falling: Secondary | ICD-10-CM | POA: Diagnosis not present

## 2019-05-18 DIAGNOSIS — J9621 Acute and chronic respiratory failure with hypoxia: Secondary | ICD-10-CM | POA: Diagnosis not present

## 2019-05-18 DIAGNOSIS — Z7951 Long term (current) use of inhaled steroids: Secondary | ICD-10-CM | POA: Diagnosis not present

## 2019-05-18 DIAGNOSIS — N179 Acute kidney failure, unspecified: Secondary | ICD-10-CM | POA: Diagnosis not present

## 2019-05-18 DIAGNOSIS — I0981 Rheumatic heart failure: Secondary | ICD-10-CM | POA: Diagnosis not present

## 2019-05-18 DIAGNOSIS — Z7984 Long term (current) use of oral hypoglycemic drugs: Secondary | ICD-10-CM | POA: Diagnosis not present

## 2019-05-18 DIAGNOSIS — I480 Paroxysmal atrial fibrillation: Secondary | ICD-10-CM | POA: Diagnosis not present

## 2019-05-18 DIAGNOSIS — I068 Other rheumatic aortic valve diseases: Secondary | ICD-10-CM | POA: Diagnosis not present

## 2019-05-18 DIAGNOSIS — Z720 Tobacco use: Secondary | ICD-10-CM | POA: Diagnosis not present

## 2019-05-18 DIAGNOSIS — Z7982 Long term (current) use of aspirin: Secondary | ICD-10-CM | POA: Diagnosis not present

## 2019-05-18 DIAGNOSIS — I5041 Acute combined systolic (congestive) and diastolic (congestive) heart failure: Secondary | ICD-10-CM | POA: Diagnosis not present

## 2019-05-18 DIAGNOSIS — F0280 Dementia in other diseases classified elsewhere without behavioral disturbance: Secondary | ICD-10-CM | POA: Diagnosis not present

## 2019-05-18 DIAGNOSIS — Z9981 Dependence on supplemental oxygen: Secondary | ICD-10-CM | POA: Diagnosis not present

## 2019-05-20 DIAGNOSIS — I110 Hypertensive heart disease with heart failure: Secondary | ICD-10-CM | POA: Diagnosis not present

## 2019-05-20 DIAGNOSIS — I0981 Rheumatic heart failure: Secondary | ICD-10-CM | POA: Diagnosis not present

## 2019-05-20 DIAGNOSIS — J9621 Acute and chronic respiratory failure with hypoxia: Secondary | ICD-10-CM | POA: Diagnosis not present

## 2019-05-20 DIAGNOSIS — I480 Paroxysmal atrial fibrillation: Secondary | ICD-10-CM | POA: Diagnosis not present

## 2019-05-20 DIAGNOSIS — I5041 Acute combined systolic (congestive) and diastolic (congestive) heart failure: Secondary | ICD-10-CM | POA: Diagnosis not present

## 2019-05-20 DIAGNOSIS — J439 Emphysema, unspecified: Secondary | ICD-10-CM | POA: Diagnosis not present

## 2019-05-20 NOTE — Telephone Encounter (Signed)
Spoke with patient daughter [name]. Patient scheduled to see Dr. Tonia Brooms 05/22/19 at 2:00. Nothing further needed at this time.

## 2019-05-21 DIAGNOSIS — I110 Hypertensive heart disease with heart failure: Secondary | ICD-10-CM | POA: Diagnosis not present

## 2019-05-21 DIAGNOSIS — I480 Paroxysmal atrial fibrillation: Secondary | ICD-10-CM | POA: Diagnosis not present

## 2019-05-21 DIAGNOSIS — I0981 Rheumatic heart failure: Secondary | ICD-10-CM | POA: Diagnosis not present

## 2019-05-21 DIAGNOSIS — I5041 Acute combined systolic (congestive) and diastolic (congestive) heart failure: Secondary | ICD-10-CM | POA: Diagnosis not present

## 2019-05-21 DIAGNOSIS — J439 Emphysema, unspecified: Secondary | ICD-10-CM | POA: Diagnosis not present

## 2019-05-21 DIAGNOSIS — J9621 Acute and chronic respiratory failure with hypoxia: Secondary | ICD-10-CM | POA: Diagnosis not present

## 2019-05-22 ENCOUNTER — Telehealth: Payer: Self-pay | Admitting: Pulmonary Disease

## 2019-05-22 ENCOUNTER — Other Ambulatory Visit: Payer: Self-pay

## 2019-05-22 ENCOUNTER — Ambulatory Visit (INDEPENDENT_AMBULATORY_CARE_PROVIDER_SITE_OTHER): Payer: Medicare Other | Admitting: Pulmonary Disease

## 2019-05-22 ENCOUNTER — Other Ambulatory Visit: Payer: Self-pay | Admitting: *Deleted

## 2019-05-22 ENCOUNTER — Encounter: Payer: Self-pay | Admitting: Pulmonary Disease

## 2019-05-22 VITALS — BP 110/60 | HR 75 | Ht 63.0 in | Wt 86.6 lb

## 2019-05-22 DIAGNOSIS — R64 Cachexia: Secondary | ICD-10-CM | POA: Diagnosis not present

## 2019-05-22 DIAGNOSIS — J449 Chronic obstructive pulmonary disease, unspecified: Secondary | ICD-10-CM | POA: Diagnosis not present

## 2019-05-22 DIAGNOSIS — I5042 Chronic combined systolic (congestive) and diastolic (congestive) heart failure: Secondary | ICD-10-CM

## 2019-05-22 DIAGNOSIS — J9611 Chronic respiratory failure with hypoxia: Secondary | ICD-10-CM | POA: Diagnosis not present

## 2019-05-22 DIAGNOSIS — R636 Underweight: Secondary | ICD-10-CM

## 2019-05-22 DIAGNOSIS — E43 Unspecified severe protein-calorie malnutrition: Secondary | ICD-10-CM | POA: Diagnosis not present

## 2019-05-22 MED ORDER — SPIRIVA RESPIMAT 2.5 MCG/ACT IN AERS: 2.0000 | INHALATION_SPRAY | Freq: Every day | RESPIRATORY_TRACT | 5 refills | Status: AC

## 2019-05-22 NOTE — Patient Instructions (Signed)
Thank you for visiting Dr. Tonia Brooms at [facility]. Today we recommend the following:  Orders Placed This Encounter  Procedures  . Amb Referral to Palliative Care   DNR forms and [place] MOST FORMS completed today   Return in about 6 months (around 11/19/2019).    Please do your part to reduce the spread of COVID-19.

## 2019-05-22 NOTE — Progress Notes (Signed)
Synopsis: Referred in January 2020 for follow-up from pneumonia.  By [name], MD  Subjective:   PATIENT ID: [name]: female DOB: May 07, 1929, MRN: [id number]  Chief Complaint  Patient presents with  . Follow-up    Pt recently in the hospital and states she is doing better since then.     This is an 86 year old female with a past medical history of chronic hypoxemic respiratory failure from nonischemic cardiomyopathy history of ejection fraction 40 to 45%, severe COPD prior spirometry with FEV1 of 0.7 L.,  BMI 15.  She is here today for follow-up after recent diagnosis of a left basilar pneumonia in December 2019.  She has completed a course of antibiotics.  She is slowly improving.  Daughter is present with her today in the office.  She said her appetite has been poor and she is slowly been trying to increase food intake.  She is still requiring 3 L nasal cannula.  Her cough is still present but slowly improving. We also discussed goals of care today in the office.  She does have significant chronic oxygen dependent lung disease and chronic heart failure which predisposes her to an overall poor prognosis when it would come to resuscitation measures if that was needed.  Patient and family are aware of this.  They would like to discuss amongst family as well as PCP to continue this discussion as they have had this brought up in the past. She has been a patient of Dr. Kriste Basque for the past several years.  OV 11/01/2018: Patient last seen in our office on 06/15/2018 by Elisha Headland.  Overall has done well with transition from Incruse Ellipta to Spiriva Respimat.  Also continued maintenance on Symbicort 160. Stable on her current inhalers. Doing a breathing treatement only about 2 times per month.  She is also using Spiriva Respimat.  Both of which are improving her symptoms as compared to her previous inhaler regimen.  Of note she is celebrating her 89th birthday tomorrow.  We also  continue the discussion of goals of care today in the office.  We discussed the risk benefits and alternatives of cardiopulmonary resuscitation.  In a patient with advanced lung disease as the patient has.  Her daughter was present for this discussion.  She did state that after her last visit she had some more discussions with her brothers regarding this.  They do not believe the chest compressions would be appropriate.  They are not yet ready to fill out these paperwork.  OV 05/22/2019: Patient seen today for follow-up regarding recent hospitalization.  Inpatient pulmonary consultation note reviewed.  Discharge summary from Dr. Sharl Ma 05/15/2019 reviewed.  Patient was admitted for acute on chronic hypoxemic respiratory failure.  She has combined systolic and diastolic heart failure exacerbation, was on atrial fibrillation with RVR on anticoagulation.  She had acute onset shortness of breath was bring to the ER via EMS.  Heart rate in the 160s.  Was given medications to control her heart rate as well as treated for heart failure and COPD exacerbation.  Patient was given steroids and antibiotics.  She was discharged home.  Seen by palliative care on the inpatient side.  Decisions at that time was made for DNR status.  Echocardiogram showed severe mitral regurgitation and ejection fraction of 35%.  Patient seen today in the office.  She feels a little bit better not back to baseline.  Slowly been recovering.  Still having home health and therapy come to the  house to see her.  Daughter present today in the office.  Today in the office we also spent 16 minutes of the office visit face-to-face discussing goals of care.  And advanced care planning.  Decision made for filling out DNR forms as well as [facility] form.   Past Medical History:  Diagnosis Date  . Acute bronchitis   . Acute on chronic respiratory failure with hypoxia (HCC)   . Alzheimer disease (HCC) 06/04/2018  . Benign hypertensive renal  disease   . CAD (coronary artery disease)   . Cardiomyopathy    non-ischemic 04/2008 EF 45% cardiac MRI no scar  . CHF (congestive heart failure) (HCC)   . Chronic sinusitis   . COPD (chronic obstructive pulmonary disease) (HCC)   . COPD exacerbation (HCC)   . Depression   . Diabetes mellitus without complication, without long-term current use of insulin (HCC)   . Dyslipidemia associated with type 2 diabetes mellitus (HCC)   . Dyspnea on exertion    chronic  . Heart disease   . Hypercholesterolemia   . Hypertension   . On home oxygen therapy    "3L; 24/7; for the last 2 1/2 weeks; none before that" (07/26/2016)  . Protein-calorie malnutrition, severe (HCC)   . Sinus headache    "chronic"  . Stroke [facility]) 1997   "mild"  . Type II diabetes mellitus (HCC)      Family History  Problem Relation Age of Onset  . Heart disease Father   . Alcohol abuse Sister   . Alcohol abuse Brother   . Coronary artery disease Other        family history     Past Surgical History:  Procedure Laterality Date  . carotid angiogram     2006  . ESOPHAGOGASTRODUODENOSCOPY     with foreign body removal  . NASAL SINUS SURGERY  ~1974;~ 1976  . TONSILLECTOMY     "when I was a chld"    Social History   Socioeconomic History  . Marital status: Divorced    Spouse name: Not on file  . Number of children: 1  . Years of education: Not on file  . Highest education level: Not on file  Occupational History  . Occupation: retired    Associate Professor: RETIRED  Tobacco Use  . Smoking status: Former Smoker    Packs/day: 0.75    Years: 70.00    Pack years: 52.50    Types: Cigarettes    Start date: 12/13/1948    Quit date: 07/26/2016    Years since quitting: 2.8  . Smokeless tobacco: Never Used  Substance and Sexual Activity  . Alcohol use: No    Alcohol/week: 0.0 standard drinks  . Drug use: No  . Sexual activity: Never  Other Topics Concern  . Not on file  Social History Narrative   Right handed     Caffeine 1 cup per day    Lives at home with daughter    Social Determinants of Health   Financial Resource Strain:   . Difficulty of Paying Living Expenses: Not on file  Food Insecurity:   . Worried About Programme researcher, broadcasting/film/video in the Last Year: Not on file  . Ran Out of Food in the Last Year: Not on file  Transportation Needs:   . Lack of Transportation (Medical): Not on file  . Lack of Transportation (Non-Medical): Not on file  Physical Activity:   . Days of Exercise per Week: Not on file  .  Minutes of Exercise per Session: Not on file  Stress:   . Feeling of Stress : Not on file  Social Connections:   . Frequency of Communication with Friends and Family: Not on file  . Frequency of Social Gatherings with Friends and Family: Not on file  . Attends Religious Services: Not on file  . Active Member of Clubs or Organizations: Not on file  . Attends Banker Meetings: Not on file  . Marital Status: Not on file  Intimate Partner Violence:   . Fear of Current or Ex-Partner: Not on file  . Emotionally Abused: Not on file  . Physically Abused: Not on file  . Sexually Abused: Not on file     No Known Allergies   Outpatient Medications Prior to Visit  Medication Sig Dispense Refill  . albuterol (PROVENTIL) (2.5 MG/3ML) 0.083% nebulizer solution Take 3 mLs (2.5 mg total) by nebulization every 4 (four) hours as needed for wheezing or shortness of breath. 120 mL 5  . aspirin EC 81 MG tablet Take 81 mg by mouth daily.    Marland Kitchen atorvastatin (LIPITOR) 10 MG tablet TAKE 1 TABLET(10 MG) BY MOUTH DAILY AT 6 PM (Patient taking differently: Take 10 mg by mouth daily at 6 PM. ) 90 tablet 3  . benazepril (LOTENSIN) 10 MG tablet Take 10 mg by mouth 2 (two) times daily.     . budesonide-formoterol (SYMBICORT) 160-4.5 MCG/ACT inhaler INHALE 2 PUFFS INTO THE LUNGS TWICE DAILY 3 Inhaler 2  . carvedilol (COREG) 12.5 MG tablet Take 1 tablet (12.5 mg total) by mouth 2 (two) times daily with a meal.  30 tablet 2  . digoxin (LANOXIN) 0.125 MG tablet TAKE 1/2 TABLET BY MOUTH EVERY DAY. PLEASE KEEP APPT IN SEPT FOR FUTURE REFILLS (Patient taking differently: Take 0.0625 mg by mouth daily. ) 45 tablet 3  . feeding supplement (BOOST HIGH PROTEIN) LIQD Take 1 Container by mouth as needed (between meal).    . ferrous sulfate 325 (65 FE) MG tablet Take 325 mg by mouth daily with breakfast.    . fluticasone (FLONASE) 50 MCG/ACT nasal spray Place 1 spray into both nostrils daily as needed for allergies.  3  . furosemide (LASIX) 20 MG tablet TAKE 1 TABLET BY MOUTH ONCE DAILY (Patient taking differently: Take 20 mg by mouth daily. ) 90 tablet 3  . guaiFENesin (MUCINEX) 600 MG 12 hr tablet Take 1 tablet (600 mg total) by mouth 2 (two) times daily. 30 tablet 0  . Lactobacillus (PROBIOTIC ACIDOPHILUS PO) Take 1 capsule by mouth daily.    . memantine (NAMENDA) 10 MG tablet TAKE 1 TABLET(10 MG) BY MOUTH TWICE DAILY (Patient taking differently: Take 10 mg by mouth 2 (two) times daily. ) 180 tablet 1  . metFORMIN (GLUCOPHAGE-XR) 500 MG 24 hr tablet Take 1,000 mg by mouth 2 (two) times daily.   5  . Multiple Vitamin (MV-ONE PO) Take 1 tablet by mouth daily.    Marland Kitchen Spacer/Aero-Holding Chambers (AEROCHAMBER MV) inhaler Use as instructed 1 each 0  . tamsulosin (FLOMAX) 0.4 MG CAPS capsule Take 1 capsule (0.4 mg total) by mouth daily. 15 capsule 0  . Tiotropium Bromide Monohydrate (SPIRIVA RESPIMAT) 2.5 MCG/ACT AERS Inhale 2 puffs into the lungs daily. 4 g 5  . vitamin B-12 (CYANOCOBALAMIN) 500 MCG tablet Take 1,000 mcg by mouth daily.     . INCRUSE ELLIPTA 62.5 MCG/INH AEPB INHALE 1 PUFF INTO THE LUNGS DAILY 30 each 11  . albuterol (VENTOLIN HFA)  108 (90 Base) MCG/ACT inhaler Inhale 2 puffs into the lungs every 6 (six) hours as needed for wheezing or shortness of breath. 8 g 5  . insulin aspart (NOVOLOG) 100 UNIT/ML injection Inject into the skin daily as needed for high blood sugar (For CBG >300). As needed    .  nystatin (MYCOSTATIN) 100000 UNIT/ML suspension Take 5 mLs (500,000 Units total) by mouth 4 (four) times daily. 60 mL 0  . predniSONE (DELTASONE) 10 MG tablet Prednisone 40 mg po daily x 1 day then Prednisone 30 mg po daily x 1 day then Prednisone 20 mg po daily x 1 day then Prednisone 10 mg daily x 1 day then stop... 10 tablet 0   No facility-administered medications prior to visit.    Review of Systems  Constitutional: Negative for chills, fever, malaise/fatigue and weight loss.  HENT: Negative for hearing loss, sore throat and tinnitus.   Eyes: Negative for blurred vision and double vision.  Respiratory: Positive for cough, sputum production, shortness of breath and wheezing. Negative for hemoptysis and stridor.   Cardiovascular: Negative for chest pain, palpitations, orthopnea, leg swelling and PND.  Gastrointestinal: Negative for abdominal pain, constipation, diarrhea, heartburn, nausea and vomiting.  Genitourinary: Negative for dysuria, hematuria and urgency.  Musculoskeletal: Negative for joint pain and myalgias.  Skin: Negative for itching and rash.  Neurological: Negative for dizziness, tingling, weakness and headaches.  Endo/Heme/Allergies: Negative for environmental allergies. Does not bruise/bleed easily.  Psychiatric/Behavioral: Negative for depression. The patient is not nervous/anxious and does not have insomnia.   All other systems reviewed and are negative.    Objective:  Physical Exam Vitals reviewed.  Constitutional:      General: She is not in acute distress.    Appearance: She is well-developed.  HENT:     Head: Normocephalic and atraumatic.     Mouth/Throat:     Pharynx: No oropharyngeal exudate.  Eyes:     Conjunctiva/sclera: Conjunctivae normal.     Pupils: Pupils are equal, round, and reactive to light.  Neck:     Vascular: No JVD.     Trachea: No tracheal deviation.     Comments: Loss of supraclavicular fat Cardiovascular:     Rate and Rhythm:  Normal rate and regular rhythm.     Heart sounds: S1 normal and S2 normal.     Comments: Distant heart tones Pulmonary:     Effort: No tachypnea or accessory muscle usage.     Breath sounds: No stridor. Decreased breath sounds (throughout all lung fields) present. No wheezing, rhonchi or rales.  Abdominal:     General: Bowel sounds are normal. There is no distension.     Palpations: Abdomen is soft.     Tenderness: There is no abdominal tenderness.  Musculoskeletal:        General: Deformity (muscle wasting ) present.  Skin:    General: Skin is warm and dry.     Capillary Refill: Capillary refill takes less than 2 seconds.     Findings: No rash.  Neurological:     Mental Status: She is alert and oriented to person, place, and time.  Psychiatric:        Behavior: Behavior normal.      Vitals:   05/22/19 1420  BP: 110/60  Pulse: 75  SpO2: 100%  Weight: 86 lb 9.6 oz (39.3 kg)  Height: 5\' 3"  (1.6 m)   100% on 3 LPM  BMI Readings from Last 3 Encounters:  05/22/19 15.34 kg/m  05/14/19 17.48 kg/m  01/21/19 15.94 kg/m   Wt Readings from Last 3 Encounters:  05/22/19 86 lb 9.6 oz (39.3 kg)  05/14/19 89 lb 8.1 oz (40.6 kg)  01/21/19 90 lb (40.8 kg)     CBC    Component Value Date/Time   WBC 11.7 (H) 05/12/2019 0219   RBC 3.30 (L) 05/12/2019 0219   HGB 10.1 (L) 05/12/2019 0219   HGB 12.6 05/24/2016 0924   HCT 32.3 (L) 05/12/2019 0219   HCT 37.9 05/24/2016 0924   PLT 134 (L) 05/12/2019 0219   PLT 241 05/24/2016 0924   MCV 97.9 05/12/2019 0219   MCV 95 05/24/2016 0924   MCH 30.6 05/12/2019 0219   MCHC 31.3 05/12/2019 0219   RDW 14.2 05/12/2019 0219   RDW 13.8 05/24/2016 0924   LYMPHSABS 2.7 09/14/2016 1237   MONOABS 1.1 (H) 09/14/2016 1237   EOSABS 0.1 09/14/2016 1237   BASOSABS 0.1 09/14/2016 1237   Lab Results  Component Value Date   CREATININE 0.59 05/14/2019   BUN 20 05/14/2019   NA 143 05/14/2019   K 4.2 05/14/2019   CL 103 05/14/2019   CO2 32  05/14/2019     Chest Imaging: Chest x-ray 04/24/2018: Left basilar infiltrate. The patient's images have been independently reviewed by me.    Pulmonary Functions Testing Results: No flowsheet data found.  Office Spiro: FEV1 0.7L, 44% predicted  FeNO: None   Pathology: None   Echocardiogram: None   Heart Catheterization:  07/2016: Study Conclusions - Left ventricle: Diffuse hypokinesis abnormal septal motion The   cavity size was mildly dilated. Wall thickness was normal.   Systolic function was mildly to moderately reduced. The estimated   ejection fraction was in the range of 40% to 45%. - Aortic valve: Valve area (VTI): 1.49 cm^2. Valve area (Vmax):   1.61 cm^2. Valve area (Vmean): 1.46 cm^2. - Aorta: calcification of the intervalvular fibrosa and aortic   sinus. - Mitral valve: There was mild regurgitation. - Atrial septum: A patent foramen ovale cannot be excluded.    Assessment & Plan:     ICD-10-CM   1. Stage 4 very severe COPD by GOLD classification (HCC)  J44.9 Amb Referral to Palliative Care  2. Underweight  R63.6   3. Cachexia (HCC)  R64   4. Protein-calorie malnutrition, severe  E43   5. Chronic respiratory failure with hypoxia (HCC)  J96.11   6. Chronic combined systolic and diastolic heart failure (HCC)  O24.23    Assessment:   This is an 86 year old female, severe protein calorie malnutrition, severe COPD FEV1 0.7 L, end-stage lung disease, significant cachexia, low body mass index, BMI 15, chronic systolic and diastolic heart failure recent hospitalization.  For respiratory failure.  Currently maintained on Spiriva plus Symbicort.  Heart failure regimen per cardiology.  Still with ongoing symptomatology.  Due to her end-stage lung disease have high risk of death related to her multiple medical comorbidities this can again discussed with patient's daughter and patient today at bedside.  Plan Following Extensive Data Review & Interpretation:  . I reviewed  prior external note(s) from recent hospitalization including discharge summary dated1/20/2021 by Dr. Sharl Ma. . I reviewed the result(s) of echocardiogram 05/09/2019 ejection fraction 35%, severe MR . I have ordered referral to palliative care.  I believe outpatient palliative care would be appropriate in this setting with her advanced multiple medical comorbidities.  I discussed this today in the office. 16 minutes of time spent with advanced care planning.  Independent  interpretation of tests . Review of patient's chest imaging 05/09/2019, chest x-ray images revealed hyperinflated lung fields no infiltrate. The patient's images have been independently reviewed by me.    Copies of [address] form as well as [employer] durable DO NOT RESUSCITATE form were completed today in the office.  These will be scanned into epic as well as the advanced care planning section.  Patient return to our clinic in 6 months or as needed based on symptoms.    Current Outpatient Medications:  .  albuterol (PROVENTIL) (2.5 MG/3ML) 0.083% nebulizer solution, Take 3 mLs (2.5 mg total) by nebulization every 4 (four) hours as needed for wheezing or shortness of breath., Disp: 120 mL, Rfl: 5 .  aspirin EC 81 MG tablet, Take 81 mg by mouth daily., Disp: , Rfl:  .  atorvastatin (LIPITOR) 10 MG tablet, TAKE 1 TABLET(10 MG) BY MOUTH DAILY AT 6 PM (Patient taking differently: Take 10 mg by mouth daily at 6 PM. ), Disp: 90 tablet, Rfl: 3 .  benazepril (LOTENSIN) 10 MG tablet, Take 10 mg by mouth 2 (two) times daily. , Disp: , Rfl:  .  budesonide-formoterol (SYMBICORT) 160-4.5 MCG/ACT inhaler, INHALE 2 PUFFS INTO THE LUNGS TWICE DAILY, Disp: 3 Inhaler, Rfl: 2 .  carvedilol (COREG) 12.5 MG tablet, Take 1 tablet (12.5 mg total) by mouth 2 (two) times daily with a meal., Disp: 30 tablet, Rfl: 2 .  digoxin (LANOXIN) 0.125 MG tablet, TAKE 1/2 TABLET BY MOUTH EVERY DAY. PLEASE KEEP APPT IN SEPT FOR FUTURE REFILLS (Patient  taking differently: Take 0.0625 mg by mouth daily. ), Disp: 45 tablet, Rfl: 3 .  feeding supplement (BOOST HIGH PROTEIN) LIQD, Take 1 Container by mouth as needed (between meal)., Disp: , Rfl:  .  ferrous sulfate 325 (65 FE) MG tablet, Take 325 mg by mouth daily with breakfast., Disp: , Rfl:  .  fluticasone (FLONASE) 50 MCG/ACT nasal spray, Place 1 spray into both nostrils daily as needed for allergies., Disp: , Rfl: 3 .  furosemide (LASIX) 20 MG tablet, TAKE 1 TABLET BY MOUTH ONCE DAILY (Patient taking differently: Take 20 mg by mouth daily. ), Disp: 90 tablet, Rfl: 3 .  guaiFENesin (MUCINEX) 600 MG 12 hr tablet, Take 1 tablet (600 mg total) by mouth 2 (two) times daily., Disp: 30 tablet, Rfl: 0 .  Lactobacillus (PROBIOTIC ACIDOPHILUS PO), Take 1 capsule by mouth daily., Disp: , Rfl:  .  memantine (NAMENDA) 10 MG tablet, TAKE 1 TABLET(10 MG) BY MOUTH TWICE DAILY (Patient taking differently: Take 10 mg by mouth 2 (two) times daily. ), Disp: 180 tablet, Rfl: 1 .  metFORMIN (GLUCOPHAGE-XR) 500 MG 24 hr tablet, Take 1,000 mg by mouth 2 (two) times daily. , Disp: , Rfl: 5 .  Multiple Vitamin (MV-ONE PO), Take 1 tablet by mouth daily., Disp: , Rfl:  .  Spacer/Aero-Holding Chambers (AEROCHAMBER MV) inhaler, Use as instructed, Disp: 1 each, Rfl: 0 .  tamsulosin (FLOMAX) 0.4 MG CAPS capsule, Take 1 capsule (0.4 mg total) by mouth daily., Disp: 15 capsule, Rfl: 0 .  Tiotropium Bromide Monohydrate (SPIRIVA RESPIMAT) 2.5 MCG/ACT AERS, Inhale 2 puffs into the lungs daily., Disp: 4 g, Rfl: 5 .  vitamin B-12 (CYANOCOBALAMIN) 500 MCG tablet, Take 1,000 mcg by mouth daily. , Disp: , Rfl:  .  albuterol (VENTOLIN HFA) 108 (90 Base) MCG/ACT inhaler, Inhale 2 puffs into the lungs every 6 (six) hours as needed for wheezing or shortness of breath., Disp: 8 g, Rfl:  5   Josephine Igo, DO [facility] 05/22/2019 2:26 PM

## 2019-05-22 NOTE — Telephone Encounter (Signed)
Called and spoke with pt's daughter [name] about the forms that were filled out in office. Stated to her that the original forms were supposed to be sent home with them and copies to be left with [place]. Asked her if she wanted me to place them in the mail or pick them up at office and she said she would come by office to pick the forms up. Forms have been placed in filing cabinet up front. Nothing further needed.

## 2019-05-23 ENCOUNTER — Telehealth: Payer: Self-pay

## 2019-05-23 DIAGNOSIS — I110 Hypertensive heart disease with heart failure: Secondary | ICD-10-CM | POA: Diagnosis not present

## 2019-05-23 DIAGNOSIS — I5041 Acute combined systolic (congestive) and diastolic (congestive) heart failure: Secondary | ICD-10-CM | POA: Diagnosis not present

## 2019-05-23 DIAGNOSIS — J439 Emphysema, unspecified: Secondary | ICD-10-CM | POA: Diagnosis not present

## 2019-05-23 DIAGNOSIS — J9621 Acute and chronic respiratory failure with hypoxia: Secondary | ICD-10-CM | POA: Diagnosis not present

## 2019-05-23 DIAGNOSIS — I480 Paroxysmal atrial fibrillation: Secondary | ICD-10-CM | POA: Diagnosis not present

## 2019-05-23 DIAGNOSIS — I0981 Rheumatic heart failure: Secondary | ICD-10-CM | POA: Diagnosis not present

## 2019-05-23 NOTE — Telephone Encounter (Signed)
Telephone call to patient to schedule palliative care visit.  Patient in agreement with paliative care team making home visit 05-28-19 at 12:30 PM.

## 2019-05-24 DIAGNOSIS — J449 Chronic obstructive pulmonary disease, unspecified: Secondary | ICD-10-CM | POA: Diagnosis not present

## 2019-05-24 DIAGNOSIS — J439 Emphysema, unspecified: Secondary | ICD-10-CM | POA: Diagnosis not present

## 2019-05-24 DIAGNOSIS — E43 Unspecified severe protein-calorie malnutrition: Secondary | ICD-10-CM | POA: Diagnosis not present

## 2019-05-24 DIAGNOSIS — I480 Paroxysmal atrial fibrillation: Secondary | ICD-10-CM | POA: Diagnosis not present

## 2019-05-24 DIAGNOSIS — E119 Type 2 diabetes mellitus without complications: Secondary | ICD-10-CM | POA: Diagnosis not present

## 2019-05-24 DIAGNOSIS — J9621 Acute and chronic respiratory failure with hypoxia: Secondary | ICD-10-CM | POA: Diagnosis not present

## 2019-05-24 DIAGNOSIS — I10 Essential (primary) hypertension: Secondary | ICD-10-CM | POA: Diagnosis not present

## 2019-05-24 DIAGNOSIS — I0981 Rheumatic heart failure: Secondary | ICD-10-CM | POA: Diagnosis not present

## 2019-05-24 DIAGNOSIS — Z794 Long term (current) use of insulin: Secondary | ICD-10-CM | POA: Diagnosis not present

## 2019-05-24 DIAGNOSIS — I5041 Acute combined systolic (congestive) and diastolic (congestive) heart failure: Secondary | ICD-10-CM | POA: Diagnosis not present

## 2019-05-24 DIAGNOSIS — I482 Chronic atrial fibrillation, unspecified: Secondary | ICD-10-CM | POA: Diagnosis not present

## 2019-05-24 DIAGNOSIS — I110 Hypertensive heart disease with heart failure: Secondary | ICD-10-CM | POA: Diagnosis not present

## 2019-05-24 DIAGNOSIS — E7800 Pure hypercholesterolemia, unspecified: Secondary | ICD-10-CM | POA: Diagnosis not present

## 2019-05-27 DIAGNOSIS — J439 Emphysema, unspecified: Secondary | ICD-10-CM | POA: Diagnosis not present

## 2019-05-27 DIAGNOSIS — I5041 Acute combined systolic (congestive) and diastolic (congestive) heart failure: Secondary | ICD-10-CM | POA: Diagnosis not present

## 2019-05-27 DIAGNOSIS — I0981 Rheumatic heart failure: Secondary | ICD-10-CM | POA: Diagnosis not present

## 2019-05-27 DIAGNOSIS — I480 Paroxysmal atrial fibrillation: Secondary | ICD-10-CM | POA: Diagnosis not present

## 2019-05-27 DIAGNOSIS — I110 Hypertensive heart disease with heart failure: Secondary | ICD-10-CM | POA: Diagnosis not present

## 2019-05-27 DIAGNOSIS — J9621 Acute and chronic respiratory failure with hypoxia: Secondary | ICD-10-CM | POA: Diagnosis not present

## 2019-05-28 ENCOUNTER — Other Ambulatory Visit: Payer: Self-pay

## 2019-05-28 ENCOUNTER — Other Ambulatory Visit: Payer: Medicare Other

## 2019-05-28 DIAGNOSIS — I480 Paroxysmal atrial fibrillation: Secondary | ICD-10-CM | POA: Diagnosis not present

## 2019-05-28 DIAGNOSIS — I0981 Rheumatic heart failure: Secondary | ICD-10-CM | POA: Diagnosis not present

## 2019-05-28 DIAGNOSIS — Z515 Encounter for palliative care: Secondary | ICD-10-CM

## 2019-05-28 DIAGNOSIS — J439 Emphysema, unspecified: Secondary | ICD-10-CM | POA: Diagnosis not present

## 2019-05-28 DIAGNOSIS — I5041 Acute combined systolic (congestive) and diastolic (congestive) heart failure: Secondary | ICD-10-CM | POA: Diagnosis not present

## 2019-05-28 DIAGNOSIS — I110 Hypertensive heart disease with heart failure: Secondary | ICD-10-CM | POA: Diagnosis not present

## 2019-05-28 DIAGNOSIS — J9621 Acute and chronic respiratory failure with hypoxia: Secondary | ICD-10-CM | POA: Diagnosis not present

## 2019-05-28 NOTE — Progress Notes (Signed)
PATIENT NAME: [name] DOB: [birth year] MRN: [id number]  PRIMARY CARE PROVIDER: Lujean Amel, MD  RESPONSIBLE PARTY:  Acct ID - Guarantor Home Phone Work Phone Relationship Acct Type  [id number] - Loeffelholz,[phone]  Self P/F     [address], [place], [place] [phone]    PLAN OF CARE and INTERVENTIONS:               1.  GOALS OF CARE/ ADVANCE CARE PLANNING:  Remain in home with daughter and continue to improve.               2.  PATIENT/CAREGIVER EDUCATION:  Education on fall precautions, education on s/s of infection, reviewed meds, support               3.  DISEASE STATUS: SW and RN made scheduled palliative care visit. Palliative care team met with patients daughter Donnald Garre and patient. Patient currently receiving [facility] OT nursing from [facility]. Patient also has a home health aide in the home that is sponsored by [facility]. Daughter reports home health aide services will discontinue this month. Patient past medical history includes but not limited to COPD, diabetes, acute / chronic respiratory failure, Alzheimer's disease, AFib. Patient alert to self and familiars. Patient denies having pain at the present time. Patient receiving O2 via nasal cannula at 2 per minute continuous. Patients weight this morning per daughter was 83 lbs. Daughter reports patients appetite is not very good. Daughter states she provides Boost for patient to drink when patient does not eat well. Patient has not suffered any recent falls.  Daughter reports patients last fall was one year ago. Patient is able to get up but does not attempt to come downstairs by herself. Daughter is able to take patient to MD appointments and reports patient is on list at the health department to receive CoVid 19 vaccine.  Patients breath sounds are diminished throughout. Patient has a non productive cough at times.  Patient has no open areas of skin breakdown. Patient has no edema in her lower extremities. Daughter  reports patient rests a lot and patient complains of feeling sleepy during visit. Daughter states patient will nap occasionally but usually rests on bed with eyes closed. Patient has a MOST form and does not want to be resuscitated. Patient has other children who are supportive but do not live local. Nurse reviewed patient's medications with daughter. Daughter requested she be contacted to schedule visits. Patient and daughter encouraged to contact palliative care with questions or concerns.     HISTORY OF PRESENT ILLNESS:    CODE STATUS: DNR  ADVANCED DIRECTIVES: Y MOST FORM: Yes PPS: 40%   PHYSICAL EXAM:   VITALS: Today's Vitals   05/28/19 1244  BP: 102/62  Pulse: 76  Resp: 16  Temp: 97.7 F (36.5 C)  TempSrc: Temporal  Weight: 89 lb 14.4 oz (40.8 kg)  Height: 5' 3"  (1.6 m)  PainSc: 0-No pain    LUNGS: decreased breath sounds CARDIAC: Cor RRR  EXTREMITIES: none edema SKIN: Skin color, texture, turgor normal. No rashes or lesions  NEURO: positive for memory problems and weakness       Nilda Simmer, RN

## 2019-05-28 NOTE — Progress Notes (Signed)
COMMUNITY PALLIATIVE CARE SW NOTE  PATIENT NAME: [name] DOB: [birth year] MRN: [id number]  PRIMARY CARE PROVIDER: Lujean Amel, MD  RESPONSIBLE PARTY:  Acct ID - Guarantor Home Phone Work Phone Relationship Acct Type  [id number] - Wimmer,[phone]  Self P/F     [address], [place], [place] [id number]     PLAN OF CARE and INTERVENTIONS:             1. GOALS OF CARE/ ADVANCE CARE PLANNING:  Patient has MOST form complete. Patient has specified to be a DNR. Goal is to keep patient at home and cared for.  2. SOCIAL/EMOTIONAL/SPIRITUAL ASSESSMENT/ INTERVENTIONS:  SW and RN met with patient and [name] (patient's daughter) in the home. [name] provided brief history. Patient was admitted to [facility] from 1/14-1/20 for acute CHF. Patient's appetite is poor. Patient is sleeping well overall. [name] tries to keep patient up and moving during the day so she can rest better at night. Patient gets tired easily. Patient is on 2L of O2. Patient is divorced. 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 [name] showed team awards that she had been given. Patient had six children, but her oldest son died in 2011-07-02. Patient's children are not all local. Patient has many grandchildren and two great grandchildren. Family will come relieve [name] on the weekends, as able. SW provided emotional support, educated on palliative care, discussed care goals, validated concerns and used active and reflective listening. 3. PATIENT/CAREGIVER EDUCATION/ COPING:  Patient is alert, engaged and calm. Patient is confused at times. [name] denies coping concerns. Family is supportive. 4. PERSONAL EMERGENCY PLAN:  Family will call 9-1-1 for emergencies. 5. COMMUNITY RESOURCES COORDINATION/ HEALTH CARE NAVIGATION:  [name] manages patient's care. [facility] is providing in-home therapy services. Alvis Lemmings is also providing aides  with [facility] but this ends next week.  6. FINANCIAL/LEGAL CONCERNS/INTERVENTIONS:  None.     SOCIAL HX:  Social History   Tobacco Use  . Smoking status: Former Smoker    Packs/day: 0.75    Years: 70.00    Pack years: 52.50    Types: Cigarettes    Start date: 12/13/1948    Quit date: 07/26/2016    Years since quitting: 2.8  . Smokeless tobacco: Never Used  Substance Use Topics  . Alcohol use: No    Alcohol/week: 0.0 standard drinks    CODE STATUS:   Code Status: Prior (DNR) ADVANCED DIRECTIVES: N MOST FORM COMPLETE:  Yes. HOSPICE EDUCATION PROVIDED: None.  PPS: Patient is able to feed and toliet herself. Patient needs assistance with bathing. Patient is using a walker to ambulate with standby assist.   I spent 45 minutes with patient/family, from 12:30-1:15p providing education, support and consultation.    Margaretmary Lombard, LCSW

## 2019-05-28 NOTE — Progress Notes (Signed)
CARDIOLOGY CONSULT NOTE       Patient ID: [name] MRN: [id number] DOB/AGE: [birth year] 86 y.o.  Primary Physician: [name], MD Primary Cardiologist: [name] F/U [facility]    HPI:  86 y.o. with history of advanced COPD, quit smoking 07/26/16  followed by Dr [facility]. PAF now Rx with coreg and digoxen with reduced EF. No anticoagulation due to age, fall risk and low body weight. Hospitalized 1/14-1/20 2021 with acute respiratory failure and CHF. Noted to be in rapid afib. Echo 05/09/19 showed newly reduced EF 30-35% with moderate LAE, mild RAE  And severe functional MR.  She improved with diuresis and Rx for her COPD. She has dementia and palliative care was involved and made home visit 05/28/19  Not thought to be a candidate for mitral clip due to co morbidities  Seen in f/u by Dr Tonia Brooms pulmonary and confirmed DNR status due to dementia, age stage 4 COPD cachexia and cardiac issues not amenable to Rx  Since home weight is down but breathing better No palpitations Has had home health and PT at home  ROS All other systems reviewed and negative except as noted above  Past Medical History:  Diagnosis Date  . Acute bronchitis   . Acute on chronic respiratory failure with hypoxia (HCC)   . Alzheimer disease (HCC) 06/04/2018  . Benign hypertensive renal disease   . CAD (coronary artery disease)   . Cardiomyopathy    non-ischemic 04/2008 EF 45% cardiac MRI no scar  . CHF (congestive heart failure) (HCC)   . Chronic sinusitis   . COPD (chronic obstructive pulmonary disease) (HCC)   . COPD exacerbation (HCC)   . Depression   . Diabetes mellitus without complication, without long-term current use of insulin (HCC)   . Dyslipidemia associated with type 2 diabetes mellitus (HCC)   . Dyspnea on exertion    chronic  . Heart disease   . Hypercholesterolemia   . Hypertension   . On home oxygen therapy    "3L; 24/7; for the last 2 1/2 weeks; none before that"  (07/26/2016)  . Protein-calorie malnutrition, severe (HCC)   . Sinus headache    "chronic"  . Stroke [facility]) 1997   "mild"  . Type II diabetes mellitus (HCC)     Family History  Problem Relation Age of Onset  . Heart disease Father   . Alcohol abuse Sister   . Alcohol abuse Brother   . Coronary artery disease Other        family history    Social History   Socioeconomic History  . Marital status: Divorced    Spouse name: Not on file  . Number of children: 1  . Years of education: Not on file  . Highest education level: Not on file  Occupational History  . Occupation: retired    Associate Professor: RETIRED  Tobacco Use  . Smoking status: Former Smoker    Packs/day: 0.75    Years: 70.00    Pack years: 52.50    Types: Cigarettes    Start date: 12/13/1948    Quit date: 07/26/2016    Years since quitting: 2.8  . Smokeless tobacco: Never Used  Substance and Sexual Activity  . Alcohol use: No    Alcohol/week: 0.0 standard drinks  . Drug use: No  . Sexual activity: Never  Other Topics Concern  . Not on file  Social History Narrative   Right handed    Caffeine 1 cup per day  Lives at home with daughter    Social Determinants of Health   Financial Resource Strain:   . Difficulty of Paying Living Expenses: Not on file  Food Insecurity:   . Worried About Programme researcher, broadcasting/film/video in the Last Year: Not on file  . Ran Out of Food in the Last Year: Not on file  Transportation Needs:   . Lack of Transportation (Medical): Not on file  . Lack of Transportation (Non-Medical): Not on file  Physical Activity:   . Days of Exercise per Week: Not on file  . Minutes of Exercise per Session: Not on file  Stress:   . Feeling of Stress : Not on file  Social Connections:   . Frequency of Communication with Friends and Family: Not on file  . Frequency of Social Gatherings with Friends and Family: Not on file  . Attends Religious Services: Not on file  . Active Member of Clubs or Organizations: Not  on file  . Attends Banker Meetings: Not on file  . Marital Status: Not on file  Intimate Partner Violence:   . Fear of Current or Ex-Partner: Not on file  . Emotionally Abused: Not on file  . Physically Abused: Not on file  . Sexually Abused: Not on file    Past Surgical History:  Procedure Laterality Date  . carotid angiogram     2006  . ESOPHAGOGASTRODUODENOSCOPY     with foreign body removal  . NASAL SINUS SURGERY  ~1974;~ 1976  . TONSILLECTOMY     "when I was a chld"      Current Outpatient Medications:  .  albuterol (PROVENTIL) (2.5 MG/3ML) 0.083% nebulizer solution, Take 3 mLs (2.5 mg total) by nebulization every 4 (four) hours as needed for wheezing or shortness of breath., Disp: 120 mL, Rfl: 5 .  albuterol (VENTOLIN HFA) 108 (90 Base) MCG/ACT inhaler, Inhale 2 puffs into the lungs every 6 (six) hours as needed for wheezing or shortness of breath., Disp: 8 g, Rfl: 5 .  aspirin EC 81 MG tablet, Take 81 mg by mouth daily., Disp: , Rfl:  .  atorvastatin (LIPITOR) 10 MG tablet, TAKE 1 TABLET(10 MG) BY MOUTH DAILY AT 6 PM (Patient taking differently: Take 10 mg by mouth daily at 6 PM. ), Disp: 90 tablet, Rfl: 3 .  benazepril (LOTENSIN) 10 MG tablet, Take 10 mg by mouth 2 (two) times daily. , Disp: , Rfl:  .  budesonide-formoterol (SYMBICORT) 160-4.5 MCG/ACT inhaler, INHALE 2 PUFFS INTO THE LUNGS TWICE DAILY, Disp: 3 Inhaler, Rfl: 2 .  carvedilol (COREG) 12.5 MG tablet, Take 1 tablet (12.5 mg total) by mouth 2 (two) times daily with a meal., Disp: 30 tablet, Rfl: 2 .  digoxin (LANOXIN) 0.125 MG tablet, TAKE 1/2 TABLET BY MOUTH EVERY DAY. PLEASE KEEP APPT IN SEPT FOR FUTURE REFILLS (Patient taking differently: Take 0.0625 mg by mouth daily. ), Disp: 45 tablet, Rfl: 3 .  feeding supplement (BOOST HIGH PROTEIN) LIQD, Take 1 Container by mouth as needed (between meal)., Disp: , Rfl:  .  ferrous sulfate 325 (65 FE) MG tablet, Take 325 mg by mouth daily with breakfast., Disp:  , Rfl:  .  fluticasone (FLONASE) 50 MCG/ACT nasal spray, Place 1 spray into both nostrils daily as needed for allergies., Disp: , Rfl: 3 .  furosemide (LASIX) 20 MG tablet, TAKE 1 TABLET BY MOUTH ONCE DAILY (Patient taking differently: Take 20 mg by mouth daily. ), Disp: 90 tablet, Rfl: 3 .  guaiFENesin (MUCINEX) 600 MG 12 hr tablet, Take 1 tablet (600 mg total) by mouth 2 (two) times daily., Disp: 30 tablet, Rfl: 0 .  Lactobacillus (PROBIOTIC ACIDOPHILUS PO), Take 1 capsule by mouth daily., Disp: , Rfl:  .  memantine (NAMENDA) 10 MG tablet, TAKE 1 TABLET(10 MG) BY MOUTH TWICE DAILY (Patient taking differently: Take 10 mg by mouth 2 (two) times daily. ), Disp: 180 tablet, Rfl: 1 .  metFORMIN (GLUCOPHAGE-XR) 500 MG 24 hr tablet, Take 1,000 mg by mouth 2 (two) times daily. , Disp: , Rfl: 5 .  Multiple Vitamin (MV-ONE PO), Take 1 tablet by mouth daily., Disp: , Rfl:  .  Spacer/Aero-Holding Chambers (AEROCHAMBER MV) inhaler, Use as instructed, Disp: 1 each, Rfl: 0 .  tamsulosin (FLOMAX) 0.4 MG CAPS capsule, Take 1 capsule (0.4 mg total) by mouth daily., Disp: 15 capsule, Rfl: 0 .  Tiotropium Bromide Monohydrate (SPIRIVA RESPIMAT) 2.5 MCG/ACT AERS, Inhale 2 puffs into the lungs daily., Disp: 4 g, Rfl: 5 .  vitamin B-12 (CYANOCOBALAMIN) 500 MCG tablet, Take 1,000 mcg by mouth daily. , Disp: , Rfl:     Physical Exam: There were no vitals taken for this visit.    Dementia Frail cachectic black female  HEENT: normal Neck supple with no adenopathy JVP elevated  Lungs exp wheezing poor breath sounds  Heart:  S1/S2 MR much softer  no rub, gallop or click PMI enlarged  Abdomen: benighn, BS positve, no tenderness, no AAA no bruit.  No HSM or HJR Distal pulses intact with no bruits No edema Neuro non-focal Skin warm and dry No muscular weakness   Labs:   Lab Results  Component Value Date   WBC 11.7 (H) 05/12/2019   HGB 10.1 (L) 05/12/2019   HCT 32.3 (L) 05/12/2019   MCV 97.9 05/12/2019    PLT 134 (L) 05/12/2019   No results for input(s): NA, K, CL, CO2, BUN, CREATININE, CALCIUM, PROT, BILITOT, ALKPHOS, ALT, AST, GLUCOSE in the last 168 hours.  Invalid input(s): LABALBU Lab Results  Component Value Date   CKTOTAL 161 11/08/2011   CKMB 4.3 (H) 11/08/2011   TROPONINI <0.03 01/13/2015    Lab Results  Component Value Date   CHOL 129 05/24/2016   CHOL  05/01/2010    171        ATP III CLASSIFICATION:  <200     mg/dL   Desirable  147-829  mg/dL   Borderline High  >=562    mg/dL   High          Lab Results  Component Value Date   HDL 65 05/24/2016   HDL 53 05/01/2010   Lab Results  Component Value Date   LDLCALC 49 05/24/2016   LDLCALC  05/01/2010    73        Total Cholesterol/HDL:CHD Risk Coronary Heart Disease Risk Table                     Men   Women  1/2 Average Risk   3.4   3.3  Average Risk       5.0   4.4  2 X Average Risk   9.6   7.1  3 X Average Risk  23.4   11.0        Use the calculated Patient Ratio above and the CHD Risk Table to determine the patient's CHD Risk.        ATP III CLASSIFICATION (LDL):  <100     mg/dL  Optimal  100-129  mg/dL   Near or Above                    Optimal  130-159  mg/dL   Borderline  160-189  mg/dL   High  >190     mg/dL   Very High   Lab Results  Component Value Date   TRIG 76 05/24/2016   TRIG 226 (H) 05/01/2010   Lab Results  Component Value Date   CHOLHDL 2.0 05/24/2016   CHOLHDL 3.2 05/01/2010   No results found for: LDLDIRECT    Radiology: DG Chest Portable 1 View  Result Date: 05/09/2019 CLINICAL DATA:  Shortness of breath EXAM: PORTABLE CHEST 1 VIEW COMPARISON:  June 15, 2018 FINDINGS: There is mild cardiomegaly. Aortic knob calcifications. Slight hyperinflation of the upper lung zones. Both lungs are clear. The visualized skeletal structures are unremarkable. IMPRESSION: No active disease. Electronically Signed   By: Prudencio Pair M.D.   On: 05/09/2019 06:34   ECHOCARDIOGRAM  COMPLETE  Result Date: 05/11/2019   ECHOCARDIOGRAM REPORT   Patient Name:   TABRIA STEINES Date of Exam: 05/09/2019 Medical Rec #:  [id number]       Height:       60.0 in Accession #:    [id number]      Weight:       103.0 lb Date of Birth:  1930-03-06       BSA:          1.41 m Patient Age:    40 years        BP:           110/60 mmHg Patient Gender: F               HR:           101 bpm. Exam Location:  Inpatient Procedure: 2D Echo Indications:    CHF  History:        Patient has prior history of Echocardiogram examinations. CHF.  Sonographer:    Vikki Ports Turrentine Referring Phys: [id number] RONDELL A SMITH IMPRESSIONS  1. Left ventricular ejection fraction, by visual estimation, is 30 to 35%. The left ventricle has moderate to severely decreased function. There is no left ventricular hypertrophy.  2. Moderately dilated left ventricular internal cavity size.  3. The left ventricle demonstrates global hypokinesis.  4. Diffuse hypokinesis worse in inferior wall.  5. Global right ventricle has normal systolic function.The right ventricular size is normal. No increase in right ventricular wall thickness.  6. Left atrial size was moderately dilated.  7. Right atrial size was mildly dilated.  8. Mild mitral annular calcification.  9. The mitral valve is normal in structure. Severe mitral valve regurgitation. 10. The tricuspid valve is normal in structure. 11. The aortic valve is tricuspid. Aortic valve regurgitation is trivial. Mild aortic valve sclerosis without stenosis. 12. Pulmonic regurgitation is mild. 13. The pulmonic valve was grossly normal. Pulmonic valve regurgitation is mild. 14. Moderately elevated pulmonary artery systolic pressure. FINDINGS  Left Ventricle: Left ventricular ejection fraction, by visual estimation, is 30 to 35%. The left ventricle has moderate to severely decreased function. The left ventricle demonstrates global hypokinesis. The left ventricular internal cavity size was moderately dilated  left ventricle. There is no left ventricular hypertrophy. Diffuse hypokinesis worse in inferior wall. Right Ventricle: The right ventricular size is normal. No increase in right ventricular wall thickness. Global RV systolic function is has normal systolic function. The tricuspid regurgitant velocity  is 3.36 m/s, and with an assumed right atrial pressure  of 10 mmHg, the estimated right ventricular systolic pressure is moderately elevated at 55.2 mmHg. Left Atrium: Left atrial size was moderately dilated. Right Atrium: Right atrial size was mildly dilated Pericardium: There is no evidence of pericardial effusion. Mitral Valve: The mitral valve is normal in structure. There is mild thickening of the mitral valve leaflet(s). There is mild calcification of the mitral valve leaflet(s). Mild mitral annular calcification. Severe mitral valve regurgitation. Tricuspid Valve: The tricuspid valve is normal in structure. Tricuspid valve regurgitation is mild. Aortic Valve: The aortic valve is tricuspid. Aortic valve regurgitation is trivial. Mild aortic valve sclerosis is present, with no evidence of aortic valve stenosis. Pulmonic Valve: The pulmonic valve was grossly normal. Pulmonic valve regurgitation is mild. Pulmonic regurgitation is mild. Aorta: The aortic root is normal in size and structure. IAS/Shunts: No atrial level shunt detected by color flow Doppler.  LEFT VENTRICLE PLAX 2D LVIDd:         6.20 cm LVIDs:         5.20 cm LV PW:         0.70 cm LV IVS:        0.60 cm LVOT diam:     2.00 cm LV SV:         64 ml LV SV Index:   45.84 LVOT Area:     3.14 cm  LV Volumes (MOD) LV area d, A2C:    28.70 cm LV area d, A4C:    25.90 cm LV area s, A2C:    23.80 cm LV area s, A4C:    19.50 cm LV major d, A2C:   6.87 cm LV major d, A4C:   6.53 cm LV major s, A2C:   6.15 cm LV major s, A4C:   6.11 cm LV vol d, MOD A2C: 99.7 ml LV vol d, MOD A4C: 86.3 ml LV vol s, MOD A2C: 75.2 ml LV vol s, MOD A4C: 53.0 ml LV SV MOD A2C:      24.5 ml LV SV MOD A4C:     86.3 ml LV SV MOD BP:      32.1 ml RIGHT VENTRICLE RV S prime:     6.64 cm/s LEFT ATRIUM              Index LA diam:        4.40 cm  3.13 cm/m LA Vol (A2C):   101.0 ml 71.74 ml/m LA Vol (A4C):   51.5 ml  36.58 ml/m LA Biplane Vol: 73.6 ml  52.28 ml/m  AORTIC VALVE LVOT Vmax:   47.90 cm/s LVOT Vmean:  32.100 cm/s LVOT VTI:    0.060 m  AORTA Ao Root diam: 2.70 cm MITRAL VALVE                        TRICUSPID VALVE MV Area (PHT): 4.80 cm             TR Peak grad:   45.2 mmHg MV PHT:        45.82 msec           TR Vmax:        336.00 cm/s MV Decel Time: 158 msec MR Peak grad:    114.3 mmHg         SHUNTS MR Mean grad:    68.0 mmHg          Systemic VTI:  0.06 m  MR Vmax:         534.50 cm/s        Systemic Diam: 2.00 cm MR Vmean:        385.0 cm/s MR PISA:         3.08 cm MR PISA Eff ROA: 35 mm MR PISA Radius:  0.70 cm MV E velocity: 117.00 cm/s 103 cm/s  [name] MD Electronically signed by [name] MD Signature Date/Time: 05/11/2019/9:50:35 AM    Final     EKG: fib/flutter RBBB LAFB 05/20/19    ASSESSMENT AND PLAN:   1. PAF:  Continue digoxin and coreg no anticoagulation due to age low body mass and fall risk appears To be back in sinus on exam today  2. CHF:  Related to low EF and severe MR not amenable to Rx not a candidate for mitral clip due To co morbidities continue ARB and lasix will recheck echo as MR murmur much softer today than in  Hospital will see if EF/MR improved  3. COPD:  Gold 4 f/u Icard continue inhalers and oxygen 2L at home  4. Dementia; on namenda progressive 5. HLD:  Continue statin   Agree with DNR and continued palliative care f/u   Signed: [name] 05/28/2019, 12:46 PM

## 2019-05-31 DIAGNOSIS — J439 Emphysema, unspecified: Secondary | ICD-10-CM | POA: Diagnosis not present

## 2019-05-31 DIAGNOSIS — I0981 Rheumatic heart failure: Secondary | ICD-10-CM | POA: Diagnosis not present

## 2019-05-31 DIAGNOSIS — J9621 Acute and chronic respiratory failure with hypoxia: Secondary | ICD-10-CM | POA: Diagnosis not present

## 2019-05-31 DIAGNOSIS — I5041 Acute combined systolic (congestive) and diastolic (congestive) heart failure: Secondary | ICD-10-CM | POA: Diagnosis not present

## 2019-05-31 DIAGNOSIS — I110 Hypertensive heart disease with heart failure: Secondary | ICD-10-CM | POA: Diagnosis not present

## 2019-05-31 DIAGNOSIS — I480 Paroxysmal atrial fibrillation: Secondary | ICD-10-CM | POA: Diagnosis not present

## 2019-06-03 DIAGNOSIS — I5041 Acute combined systolic (congestive) and diastolic (congestive) heart failure: Secondary | ICD-10-CM | POA: Diagnosis not present

## 2019-06-03 DIAGNOSIS — I110 Hypertensive heart disease with heart failure: Secondary | ICD-10-CM | POA: Diagnosis not present

## 2019-06-03 DIAGNOSIS — I480 Paroxysmal atrial fibrillation: Secondary | ICD-10-CM | POA: Diagnosis not present

## 2019-06-03 DIAGNOSIS — J9621 Acute and chronic respiratory failure with hypoxia: Secondary | ICD-10-CM | POA: Diagnosis not present

## 2019-06-03 DIAGNOSIS — J439 Emphysema, unspecified: Secondary | ICD-10-CM | POA: Diagnosis not present

## 2019-06-03 DIAGNOSIS — I0981 Rheumatic heart failure: Secondary | ICD-10-CM | POA: Diagnosis not present

## 2019-06-04 DIAGNOSIS — I0981 Rheumatic heart failure: Secondary | ICD-10-CM | POA: Diagnosis not present

## 2019-06-04 DIAGNOSIS — I480 Paroxysmal atrial fibrillation: Secondary | ICD-10-CM | POA: Diagnosis not present

## 2019-06-04 DIAGNOSIS — J439 Emphysema, unspecified: Secondary | ICD-10-CM | POA: Diagnosis not present

## 2019-06-04 DIAGNOSIS — J9621 Acute and chronic respiratory failure with hypoxia: Secondary | ICD-10-CM | POA: Diagnosis not present

## 2019-06-04 DIAGNOSIS — I110 Hypertensive heart disease with heart failure: Secondary | ICD-10-CM | POA: Diagnosis not present

## 2019-06-04 DIAGNOSIS — I5041 Acute combined systolic (congestive) and diastolic (congestive) heart failure: Secondary | ICD-10-CM | POA: Diagnosis not present

## 2019-06-05 ENCOUNTER — Ambulatory Visit (INDEPENDENT_AMBULATORY_CARE_PROVIDER_SITE_OTHER): Payer: Medicare Other | Admitting: Cardiovascular Disease

## 2019-06-05 ENCOUNTER — Other Ambulatory Visit: Payer: Self-pay

## 2019-06-05 ENCOUNTER — Encounter: Payer: Self-pay | Admitting: Cardiovascular Disease

## 2019-06-05 VITALS — BP 128/62 | HR 80 | Ht 63.0 in | Wt 89.0 lb

## 2019-06-05 DIAGNOSIS — I428 Other cardiomyopathies: Secondary | ICD-10-CM

## 2019-06-05 DIAGNOSIS — I340 Nonrheumatic mitral (valve) insufficiency: Secondary | ICD-10-CM | POA: Diagnosis not present

## 2019-06-05 NOTE — Patient Instructions (Addendum)
Medication Instructions:   *If you need a refill on your cardiac medications before your next appointment, please call your pharmacy*  Lab Work:  If you have labs (blood work) drawn today and your tests are completely normal, you will receive your results only by: . MyChart Message (if you have MyChart) OR . A paper copy in the mail If you have any lab test that is abnormal or we need to change your treatment, we will call you to review the results.  Testing/Procedures: Your physician has requested that you have an echocardiogram. Echocardiography is a painless test that uses sound waves to create images of your heart. It provides your doctor with information about the size and shape of your heart and how well your heart's chambers and valves are working. This procedure takes approximately one hour. There are no restrictions for this procedure.  Follow-Up: At [facility], you and your health needs are our priority.  As part of our continuing mission to provide you with exceptional heart care, we have created designated Provider Care Teams.  These Care Teams include your primary Cardiologist (physician) and Advanced Practice Providers (APPs -  Physician Assistants and Nurse Practitioners) who all work together to provide you with the care you need, when you need it.  Your next appointment:   6 month(s)  The format for your next appointment:   In Person  Provider:   You may see Peter Nishan, MD or one of the following Advanced Practice Providers on your designated Care Team:    Lori Gerhardt, NP  Laura Ingold, NP  Jill McDaniel, NP   

## 2019-06-06 DIAGNOSIS — I110 Hypertensive heart disease with heart failure: Secondary | ICD-10-CM | POA: Diagnosis not present

## 2019-06-06 DIAGNOSIS — J439 Emphysema, unspecified: Secondary | ICD-10-CM | POA: Diagnosis not present

## 2019-06-06 DIAGNOSIS — I480 Paroxysmal atrial fibrillation: Secondary | ICD-10-CM | POA: Diagnosis not present

## 2019-06-06 DIAGNOSIS — I0981 Rheumatic heart failure: Secondary | ICD-10-CM | POA: Diagnosis not present

## 2019-06-06 DIAGNOSIS — I5041 Acute combined systolic (congestive) and diastolic (congestive) heart failure: Secondary | ICD-10-CM | POA: Diagnosis not present

## 2019-06-06 DIAGNOSIS — J9621 Acute and chronic respiratory failure with hypoxia: Secondary | ICD-10-CM | POA: Diagnosis not present

## 2019-06-11 ENCOUNTER — Other Ambulatory Visit: Payer: Self-pay

## 2019-06-11 ENCOUNTER — Ambulatory Visit (INDEPENDENT_AMBULATORY_CARE_PROVIDER_SITE_OTHER): Payer: Medicare Other | Admitting: Neurology

## 2019-06-11 ENCOUNTER — Encounter: Payer: Self-pay | Admitting: Neurology

## 2019-06-11 VITALS — BP 153/83 | HR 93 | Temp 97.5°F | Ht 63.0 in | Wt 86.2 lb

## 2019-06-11 DIAGNOSIS — F0280 Dementia in other diseases classified elsewhere without behavioral disturbance: Secondary | ICD-10-CM | POA: Diagnosis not present

## 2019-06-11 DIAGNOSIS — G301 Alzheimer's disease with late onset: Secondary | ICD-10-CM | POA: Diagnosis not present

## 2019-06-11 NOTE — Patient Instructions (Signed)
Continue taking Namenda  Try to increase food intake, BOOST to help gain weight  Continue to see you primary doctor   See you back in 6 months

## 2019-06-11 NOTE — Progress Notes (Signed)
PATIENT: [name] DOB: 1929-08-13  REASON FOR VISIT: follow up HISTORY FROM: patient  HISTORY OF PRESENT ILLNESS: Today 06/11/19  [name] is an 86 year old female with history of progressive memory disturbance.  She remains on Namenda.  We had not started Aricept due to her difficulty maintaining weight, as well as urinary and fecal incontinence.  She was recently hospitalized for COPD exacerbation, A. fib RVR, discharged 05/15/2019.  She continues to live with her daughter.  She is doing home physical therapy, she has had an aide coming out, but this is the last week.  She requires assistance with her ADLs.  Her daughter feels her memory is stable.  During her hospitalization, for 1 day, she was confused, had altered mental status, was acting " childlike".  She has lost weight as result of being in the hospital, now weighing 86 pounds.  Her daughter manages her medications.  She has not had any falls.  She has a walker at home, but she does not like to use it.  She is sleeping well.  She remains on 2 L continuous oxygen.  Her daughter is able to leave her alone, but she is going to have cameras installed for safety.  She was found to have a leaky heart valve, is having a follow-up echocardiogram next week.  She presents today for evaluation accompanied by her daughter.  She now has palliative care consult.  HISTORY 12/03/2018 SS: [name] is an 86 year old female with history of progressive memory disturbance.  She currently lives with her daughter.  Her last memory score was 20/30.  MRI of the brain has shown mild small vessel disease.  She was started on Namenda.  She is on continuous oxygen due to her COPD.  Her daughter feels that her memory has improved somewhat since last visit with the addition of Namenda.  She is tolerating the Namenda well without side effect.  She requires some assistance with her ADLs.  Her appetite is good and since last visit she has gained nearly 2 pounds.   She has not had any falls.  She has a walker, but does not use it all the time.  Her daughter manages her medications.  Her overall health has been good since last visit.  She presents today for follow-up via virtual visit.   REVIEW OF SYSTEMS: Out of a complete 14 system review of symptoms, the patient complains only of the following symptoms, and all other reviewed systems are negative.  Memory loss  ALLERGIES: No Known Allergies  HOME MEDICATIONS: Outpatient Medications Prior to Visit  Medication Sig Dispense Refill  . albuterol (PROVENTIL) (2.5 MG/3ML) 0.083% nebulizer solution Take 3 mLs (2.5 mg total) by nebulization every 4 (four) hours as needed for wheezing or shortness of breath. 120 mL 5  . aspirin EC 81 MG tablet Take 81 mg by mouth daily.    Marland Kitchen atorvastatin (LIPITOR) 10 MG tablet TAKE 1 TABLET(10 MG) BY MOUTH DAILY AT 6 PM (Patient taking differently: Take 10 mg by mouth daily at 6 PM. ) 90 tablet 3  . benazepril (LOTENSIN) 10 MG tablet Take 10 mg by mouth 2 (two) times daily.     . budesonide-formoterol (SYMBICORT) 160-4.5 MCG/ACT inhaler INHALE 2 PUFFS INTO THE LUNGS TWICE DAILY 3 Inhaler 2  . carvedilol (COREG) 12.5 MG tablet Take 1 tablet (12.5 mg total) by mouth 2 (two) times daily with a meal. 30 tablet 2  . digoxin (LANOXIN) 0.125 MG tablet TAKE 1/2  TABLET BY MOUTH EVERY DAY. PLEASE KEEP APPT IN SEPT FOR FUTURE REFILLS (Patient taking differently: Take 0.0625 mg by mouth daily. ) 45 tablet 3  . feeding supplement (BOOST HIGH PROTEIN) LIQD Take 1 Container by mouth as needed (between meal).    . ferrous sulfate 325 (65 FE) MG tablet Take 325 mg by mouth daily with breakfast.    . fluticasone (FLONASE) 50 MCG/ACT nasal spray Place 1 spray into both nostrils daily as needed for allergies.  3  . furosemide (LASIX) 20 MG tablet TAKE 1 TABLET BY MOUTH ONCE DAILY (Patient taking differently: Take 20 mg by mouth daily. ) 90 tablet 3  . guaiFENesin (MUCINEX) 600 MG 12 hr tablet Take  1 tablet (600 mg total) by mouth 2 (two) times daily. 30 tablet 0  . Lactobacillus (PROBIOTIC ACIDOPHILUS PO) Take 1 capsule by mouth daily.    . memantine (NAMENDA) 10 MG tablet TAKE 1 TABLET(10 MG) BY MOUTH TWICE DAILY (Patient taking differently: Take 10 mg by mouth 2 (two) times daily. ) 180 tablet 1  . metFORMIN (GLUCOPHAGE-XR) 500 MG 24 hr tablet Take 1,000 mg by mouth 2 (two) times daily.   5  . Multiple Vitamin (MV-ONE PO) Take 1 tablet by mouth daily.    Marland Kitchen Spacer/Aero-Holding Chambers (AEROCHAMBER MV) inhaler Use as instructed 1 each 0  . tamsulosin (FLOMAX) 0.4 MG CAPS capsule Take 1 capsule (0.4 mg total) by mouth daily. 15 capsule 0  . Tiotropium Bromide Monohydrate (SPIRIVA RESPIMAT) 2.5 MCG/ACT AERS Inhale 2 puffs into the lungs daily. 4 g 5  . vitamin B-12 (CYANOCOBALAMIN) 500 MCG tablet Take 1,000 mcg by mouth daily.     Marland Kitchen albuterol (VENTOLIN HFA) 108 (90 Base) MCG/ACT inhaler Inhale 2 puffs into the lungs every 6 (six) hours as needed for wheezing or shortness of breath. 8 g 5   No facility-administered medications prior to visit.    PAST MEDICAL HISTORY: Past Medical History:  Diagnosis Date  . Acute bronchitis   . Acute on chronic respiratory failure with hypoxia (HCC)   . Alzheimer disease (HCC) 06/04/2018  . Benign hypertensive renal disease   . CAD (coronary artery disease)   . Cardiomyopathy    non-ischemic 04/2008 EF 45% cardiac MRI no scar  . CHF (congestive heart failure) (HCC)   . Chronic sinusitis   . COPD (chronic obstructive pulmonary disease) (HCC)   . COPD exacerbation (HCC)   . Depression   . Diabetes mellitus without complication, without long-term current use of insulin (HCC)   . Dyslipidemia associated with type 2 diabetes mellitus (HCC)   . Dyspnea on exertion    chronic  . Heart disease   . Hypercholesterolemia   . Hypertension   . On home oxygen therapy    "3L; 24/7; for the last 2 1/2 weeks; none before that" (07/26/2016)  . Protein-calorie  malnutrition, severe (HCC)   . Sinus headache    "chronic"  . Stroke [facility]) 1997   "mild"  . Type II diabetes mellitus (HCC)     PAST SURGICAL HISTORY: Past Surgical History:  Procedure Laterality Date  . carotid angiogram     2006  . ESOPHAGOGASTRODUODENOSCOPY     with foreign body removal  . NASAL SINUS SURGERY  ~1974;~ 1976  . TONSILLECTOMY     "when I was a chld"    FAMILY HISTORY: Family History  Problem Relation Age of Onset  . Heart disease Father   . Alcohol abuse Sister   .  Alcohol abuse Brother   . Coronary artery disease Other        family history    SOCIAL HISTORY: Social History   Socioeconomic History  . Marital status: Divorced    Spouse name: Not on file  . Number of children: 1  . Years of education: Not on file  . Highest education level: Not on file  Occupational History  . Occupation: retired    Associate Professor: RETIRED  Tobacco Use  . Smoking status: Former Smoker    Packs/day: 0.75    Years: 70.00    Pack years: 52.50    Types: Cigarettes    Start date: 12/13/1948    Quit date: 07/26/2016    Years since quitting: 2.8  . Smokeless tobacco: Never Used  Substance and Sexual Activity  . Alcohol use: No    Alcohol/week: 0.0 standard drinks  . Drug use: No  . Sexual activity: Never  Other Topics Concern  . Not on file  Social History Narrative   Right handed    Caffeine 1 cup per day    Lives at home with daughter    Social Determinants of Health   Financial Resource Strain:   . Difficulty of Paying Living Expenses: Not on file  Food Insecurity:   . Worried About Programme researcher, broadcasting/film/video in the Last Year: Not on file  . Ran Out of Food in the Last Year: Not on file  Transportation Needs:   . Lack of Transportation (Medical): Not on file  . Lack of Transportation (Non-Medical): Not on file  Physical Activity:   . Days of Exercise per Week: Not on file  . Minutes of Exercise per Session: Not on file  Stress:   . Feeling of Stress : Not on  file  Social Connections:   . Frequency of Communication with Friends and Family: Not on file  . Frequency of Social Gatherings with Friends and Family: Not on file  . Attends Religious Services: Not on file  . Active Member of Clubs or Organizations: Not on file  . Attends Banker Meetings: Not on file  . Marital Status: Not on file  Intimate Partner Violence:   . Fear of Current or Ex-Partner: Not on file  . Emotionally Abused: Not on file  . Physically Abused: Not on file  . Sexually Abused: Not on file      PHYSICAL EXAM  Vitals:   06/11/19 1051  BP: (!) 153/83  Pulse: 93  Weight: 86 lb 3.2 oz (39.1 kg)  Height: 5\' 3"  (1.6 m)   Body mass index is 15.27 kg/m.  Generalized: Well developed, in no acute distress  MMSE - Mini Mental State Exam 06/11/2019 06/04/2018  Orientation to time 0 0  Orientation to Place 4 3  Registration 3 3  Attention/ Calculation 0 5  Recall 0 0  Language- name 2 objects 2 2  Language- repeat 1 1  Language- follow 3 step command 3 3  Language- read & follow direction 1 1  Write a sentence 1 1  Copy design 0 1  Total score 15 20    Neurological examination  Mentation: Alert to person, date of birth, speech is clear, follows commands Cranial nerve II-XII: Pupils were equal round reactive to light. Extraocular movements were full, visual field were full on confrontational test. Facial sensation and strength were normal. Head turning and shoulder shrug were normal and symmetric. Motor: Good strength of all extremities.  Sensory: Sensory testing is  intact to soft touch on all 4 extremities. No evidence of extinction is noted.  Coordination: Cerebellar testing reveals good finger-nose-finger and heel-to-shin bilaterally.  Gait and station: Gait is cautious, slow, no assistive device Reflexes: Deep tendon reflexes are symmetric   DIAGNOSTIC DATA (LABS, IMAGING, TESTING) - I reviewed patient records, labs, notes, testing and imaging  myself where available.  Lab Results  Component Value Date   WBC 11.7 (H) 05/12/2019   HGB 10.1 (L) 05/12/2019   HCT 32.3 (L) 05/12/2019   MCV 97.9 05/12/2019   PLT 134 (L) 05/12/2019      Component Value Date/Time   NA 143 05/14/2019 0236   NA 135 (A) 08/04/2016 0000   K 4.2 05/14/2019 0236   CL 103 05/14/2019 0236   CO2 32 05/14/2019 0236   GLUCOSE 84 05/14/2019 0236   BUN 20 05/14/2019 0236   BUN 21 08/04/2016 0000   CREATININE 0.59 05/14/2019 0236   CALCIUM 8.9 05/14/2019 0236   PROT 7.6 05/11/2019 0916   PROT 7.1 05/24/2016 0924   ALBUMIN 3.8 05/11/2019 0916   ALBUMIN 4.5 05/24/2016 0924   AST 44 (H) 05/11/2019 0916   ALT 52 (H) 05/11/2019 0916   ALKPHOS 46 05/11/2019 0916   BILITOT 0.7 05/11/2019 0916   BILITOT 0.5 05/24/2016 0924   GFRNONAA >60 05/14/2019 0236   GFRAA >60 05/14/2019 0236   Lab Results  Component Value Date   CHOL 129 05/24/2016   HDL 65 05/24/2016   LDLCALC 49 05/24/2016   TRIG 76 05/24/2016   CHOLHDL 2.0 05/24/2016   Lab Results  Component Value Date   HGBA1C 6.0 (H) 05/09/2019   No results found for: [id number] Lab Results  Component Value Date   TSH 1.429 05/09/2019      ASSESSMENT AND PLAN 86 y.o. year old female  has a past medical history of Acute bronchitis, Acute on chronic respiratory failure with hypoxia ([place]), Alzheimer disease ([place]) (06/04/2018), Benign hypertensive renal disease, CAD (coronary artery disease), Cardiomyopathy, CHF (congestive heart failure) ([place]), Chronic sinusitis, COPD (chronic obstructive pulmonary disease) ([place]), COPD exacerbation ([place]), Depression, Diabetes mellitus without complication, without long-term current use of insulin ([place]), Dyslipidemia associated with type 2 diabetes mellitus ([place]), Dyspnea on exertion, Heart disease, Hypercholesterolemia, Hypertension, On home oxygen therapy, Protein-calorie malnutrition, severe ([place]), Sinus headache, Stroke ([place]) (1997), and Type II diabetes mellitus ([place]).  here with:  1.  Progressive memory disturbance, Alzheimer's disease  She has had some decline in her memory score, 15/30.  She will remain on Namenda.  I will not start Aricept or Exelon due to her difficulty maintaining weight.  She seems to be recovering fairly well, following her recent hospitalization for A. fib with RVR, and a COPD exacerbation.  I encouraged her to continue reading, and doing word puzzles.  She will continue to try to increase her weight, using BOOST.  She will follow-up in 6 months or sooner if needed.  I did advise if her symptoms worsen or she develops any new symptoms she should let us know.   I spent 15 minutes with the patient. 50% of this time was spent discussing her plan of care.   Butler Denmark, AGNP-C, DNP 06/11/2019, 10:55 AM [facility] [address], [place], [place] [address] [phone]

## 2019-06-11 NOTE — Progress Notes (Signed)
I have read the note, and I agree with the clinical assessment and plan.  Brandilyn Nanninga [place]   

## 2019-06-13 DIAGNOSIS — I0981 Rheumatic heart failure: Secondary | ICD-10-CM | POA: Diagnosis not present

## 2019-06-13 DIAGNOSIS — I480 Paroxysmal atrial fibrillation: Secondary | ICD-10-CM | POA: Diagnosis not present

## 2019-06-13 DIAGNOSIS — I110 Hypertensive heart disease with heart failure: Secondary | ICD-10-CM | POA: Diagnosis not present

## 2019-06-13 DIAGNOSIS — J9621 Acute and chronic respiratory failure with hypoxia: Secondary | ICD-10-CM | POA: Diagnosis not present

## 2019-06-13 DIAGNOSIS — I5041 Acute combined systolic (congestive) and diastolic (congestive) heart failure: Secondary | ICD-10-CM | POA: Diagnosis not present

## 2019-06-13 DIAGNOSIS — J439 Emphysema, unspecified: Secondary | ICD-10-CM | POA: Diagnosis not present

## 2019-06-14 DIAGNOSIS — I480 Paroxysmal atrial fibrillation: Secondary | ICD-10-CM | POA: Diagnosis not present

## 2019-06-14 DIAGNOSIS — I5041 Acute combined systolic (congestive) and diastolic (congestive) heart failure: Secondary | ICD-10-CM | POA: Diagnosis not present

## 2019-06-14 DIAGNOSIS — I110 Hypertensive heart disease with heart failure: Secondary | ICD-10-CM | POA: Diagnosis not present

## 2019-06-14 DIAGNOSIS — J9621 Acute and chronic respiratory failure with hypoxia: Secondary | ICD-10-CM | POA: Diagnosis not present

## 2019-06-14 DIAGNOSIS — J439 Emphysema, unspecified: Secondary | ICD-10-CM | POA: Diagnosis not present

## 2019-06-14 DIAGNOSIS — I0981 Rheumatic heart failure: Secondary | ICD-10-CM | POA: Diagnosis not present

## 2019-06-17 DIAGNOSIS — I088 Other rheumatic multiple valve diseases: Secondary | ICD-10-CM | POA: Diagnosis not present

## 2019-06-17 DIAGNOSIS — Z7984 Long term (current) use of oral hypoglycemic drugs: Secondary | ICD-10-CM | POA: Diagnosis not present

## 2019-06-17 DIAGNOSIS — Z720 Tobacco use: Secondary | ICD-10-CM | POA: Diagnosis not present

## 2019-06-17 DIAGNOSIS — Z7982 Long term (current) use of aspirin: Secondary | ICD-10-CM | POA: Diagnosis not present

## 2019-06-17 DIAGNOSIS — I5041 Acute combined systolic (congestive) and diastolic (congestive) heart failure: Secondary | ICD-10-CM | POA: Diagnosis not present

## 2019-06-17 DIAGNOSIS — J9621 Acute and chronic respiratory failure with hypoxia: Secondary | ICD-10-CM | POA: Diagnosis not present

## 2019-06-17 DIAGNOSIS — N179 Acute kidney failure, unspecified: Secondary | ICD-10-CM | POA: Diagnosis not present

## 2019-06-17 DIAGNOSIS — Z9981 Dependence on supplemental oxygen: Secondary | ICD-10-CM | POA: Diagnosis not present

## 2019-06-17 DIAGNOSIS — F0280 Dementia in other diseases classified elsewhere without behavioral disturbance: Secondary | ICD-10-CM | POA: Diagnosis not present

## 2019-06-17 DIAGNOSIS — I110 Hypertensive heart disease with heart failure: Secondary | ICD-10-CM | POA: Diagnosis not present

## 2019-06-17 DIAGNOSIS — Z7951 Long term (current) use of inhaled steroids: Secondary | ICD-10-CM | POA: Diagnosis not present

## 2019-06-17 DIAGNOSIS — G309 Alzheimer's disease, unspecified: Secondary | ICD-10-CM | POA: Diagnosis not present

## 2019-06-17 DIAGNOSIS — I480 Paroxysmal atrial fibrillation: Secondary | ICD-10-CM | POA: Diagnosis not present

## 2019-06-17 DIAGNOSIS — I0981 Rheumatic heart failure: Secondary | ICD-10-CM | POA: Diagnosis not present

## 2019-06-17 DIAGNOSIS — Z9181 History of falling: Secondary | ICD-10-CM | POA: Diagnosis not present

## 2019-06-17 DIAGNOSIS — J439 Emphysema, unspecified: Secondary | ICD-10-CM | POA: Diagnosis not present

## 2019-06-17 DIAGNOSIS — E119 Type 2 diabetes mellitus without complications: Secondary | ICD-10-CM | POA: Diagnosis not present

## 2019-06-17 DIAGNOSIS — Z79899 Other long term (current) drug therapy: Secondary | ICD-10-CM | POA: Diagnosis not present

## 2019-06-18 DIAGNOSIS — I110 Hypertensive heart disease with heart failure: Secondary | ICD-10-CM | POA: Diagnosis not present

## 2019-06-18 DIAGNOSIS — I480 Paroxysmal atrial fibrillation: Secondary | ICD-10-CM | POA: Diagnosis not present

## 2019-06-18 DIAGNOSIS — J439 Emphysema, unspecified: Secondary | ICD-10-CM | POA: Diagnosis not present

## 2019-06-18 DIAGNOSIS — I5041 Acute combined systolic (congestive) and diastolic (congestive) heart failure: Secondary | ICD-10-CM | POA: Diagnosis not present

## 2019-06-18 DIAGNOSIS — I0981 Rheumatic heart failure: Secondary | ICD-10-CM | POA: Diagnosis not present

## 2019-06-18 DIAGNOSIS — J9621 Acute and chronic respiratory failure with hypoxia: Secondary | ICD-10-CM | POA: Diagnosis not present

## 2019-06-20 ENCOUNTER — Ambulatory Visit (HOSPITAL_COMMUNITY): Payer: Medicare Other | Attending: Cardiovascular Disease

## 2019-06-20 ENCOUNTER — Other Ambulatory Visit: Payer: Self-pay

## 2019-06-20 DIAGNOSIS — Z8249 Family history of ischemic heart disease and other diseases of the circulatory system: Secondary | ICD-10-CM | POA: Insufficient documentation

## 2019-06-20 DIAGNOSIS — I509 Heart failure, unspecified: Secondary | ICD-10-CM | POA: Diagnosis not present

## 2019-06-20 DIAGNOSIS — I340 Nonrheumatic mitral (valve) insufficiency: Secondary | ICD-10-CM | POA: Insufficient documentation

## 2019-06-20 DIAGNOSIS — I130 Hypertensive heart and chronic kidney disease with heart failure and stage 1 through stage 4 chronic kidney disease, or unspecified chronic kidney disease: Secondary | ICD-10-CM | POA: Insufficient documentation

## 2019-06-20 DIAGNOSIS — Z87891 Personal history of nicotine dependence: Secondary | ICD-10-CM | POA: Insufficient documentation

## 2019-06-20 DIAGNOSIS — N189 Chronic kidney disease, unspecified: Secondary | ICD-10-CM | POA: Diagnosis not present

## 2019-06-20 DIAGNOSIS — I428 Other cardiomyopathies: Secondary | ICD-10-CM | POA: Insufficient documentation

## 2019-06-20 DIAGNOSIS — E1122 Type 2 diabetes mellitus with diabetic chronic kidney disease: Secondary | ICD-10-CM | POA: Diagnosis not present

## 2019-06-20 DIAGNOSIS — E785 Hyperlipidemia, unspecified: Secondary | ICD-10-CM | POA: Insufficient documentation

## 2019-06-25 DIAGNOSIS — E7800 Pure hypercholesterolemia, unspecified: Secondary | ICD-10-CM | POA: Diagnosis not present

## 2019-06-25 DIAGNOSIS — J440 Chronic obstructive pulmonary disease with acute lower respiratory infection: Secondary | ICD-10-CM | POA: Diagnosis not present

## 2019-06-25 DIAGNOSIS — J9621 Acute and chronic respiratory failure with hypoxia: Secondary | ICD-10-CM | POA: Diagnosis not present

## 2019-06-25 DIAGNOSIS — I110 Hypertensive heart disease with heart failure: Secondary | ICD-10-CM | POA: Diagnosis not present

## 2019-06-25 DIAGNOSIS — Z79899 Other long term (current) drug therapy: Secondary | ICD-10-CM | POA: Diagnosis not present

## 2019-06-25 DIAGNOSIS — J439 Emphysema, unspecified: Secondary | ICD-10-CM | POA: Diagnosis not present

## 2019-06-25 DIAGNOSIS — I480 Paroxysmal atrial fibrillation: Secondary | ICD-10-CM | POA: Diagnosis not present

## 2019-06-25 DIAGNOSIS — Z7984 Long term (current) use of oral hypoglycemic drugs: Secondary | ICD-10-CM | POA: Diagnosis not present

## 2019-06-25 DIAGNOSIS — E1169 Type 2 diabetes mellitus with other specified complication: Secondary | ICD-10-CM | POA: Diagnosis not present

## 2019-06-25 DIAGNOSIS — I5041 Acute combined systolic (congestive) and diastolic (congestive) heart failure: Secondary | ICD-10-CM | POA: Diagnosis not present

## 2019-06-25 DIAGNOSIS — J449 Chronic obstructive pulmonary disease, unspecified: Secondary | ICD-10-CM | POA: Diagnosis not present

## 2019-06-25 DIAGNOSIS — I0981 Rheumatic heart failure: Secondary | ICD-10-CM | POA: Diagnosis not present

## 2019-06-25 DIAGNOSIS — E43 Unspecified severe protein-calorie malnutrition: Secondary | ICD-10-CM | POA: Diagnosis not present

## 2019-06-25 DIAGNOSIS — I10 Essential (primary) hypertension: Secondary | ICD-10-CM | POA: Diagnosis not present

## 2019-06-26 DIAGNOSIS — J439 Emphysema, unspecified: Secondary | ICD-10-CM | POA: Diagnosis not present

## 2019-06-26 DIAGNOSIS — I480 Paroxysmal atrial fibrillation: Secondary | ICD-10-CM | POA: Diagnosis not present

## 2019-06-26 DIAGNOSIS — I0981 Rheumatic heart failure: Secondary | ICD-10-CM | POA: Diagnosis not present

## 2019-06-26 DIAGNOSIS — J9621 Acute and chronic respiratory failure with hypoxia: Secondary | ICD-10-CM | POA: Diagnosis not present

## 2019-06-26 DIAGNOSIS — I110 Hypertensive heart disease with heart failure: Secondary | ICD-10-CM | POA: Diagnosis not present

## 2019-06-26 DIAGNOSIS — I5041 Acute combined systolic (congestive) and diastolic (congestive) heart failure: Secondary | ICD-10-CM | POA: Diagnosis not present

## 2019-07-15 ENCOUNTER — Telehealth: Payer: Self-pay

## 2019-07-15 NOTE — Telephone Encounter (Signed)
Telephone call to patient to schedule palliative care visit with patient. Patient/family in agreement with home visit on 07-19-19 at 12:00PM.

## 2019-07-19 ENCOUNTER — Other Ambulatory Visit: Payer: Self-pay

## 2019-07-19 ENCOUNTER — Other Ambulatory Visit: Payer: Medicare Other

## 2019-07-19 DIAGNOSIS — Z515 Encounter for palliative care: Secondary | ICD-10-CM

## 2019-07-19 NOTE — Progress Notes (Signed)
COMMUNITY PALLIATIVE CARE SW NOTE  PATIENT NAME: [name] DOB: 1929/06/19 MRN: [id number]  PRIMARY CARE PROVIDER: [name], MD  RESPONSIBLE PARTY:  Acct ID - Guarantor Home Phone Work Phone Relationship Acct Type  [id number] - Ketterman,BILL* [phone]  Self P/F     [address], [place], [place] [address]     PLAN OF CARE and INTERVENTIONS:             1. GOALS OF CARE/ ADVANCE CARE PLANNING:  Patient has MOST form complete. Patient is a DNR. Goal is to keep patient at home and cared for. [name] is hopeful that other family members will help more often when her grandbaby is born in May.  2. SOCIAL/EMOTIONAL/SPIRITUAL ASSESSMENT/ INTERVENTIONS:  SW completed TELEHEALTH visit with patient and [name] (patient's daughter). [name] provided update. Patient saw her PCP, cardiologist and neurologist last month. No changes. Patient has received both COVID vaccines, no concerns. Patient continues to be on 2L of O2. [name] said that patient's appetite varies, [name] noticed she has to encourage patient to eat more often. Patient weighed 84lbs today on her home scale. Patient is sleeping well. SW provided emotional support, discussed goals, and used active and reflective listening 3. PATIENT/CAREGIVER EDUCATION/ COPING:  Patient is alert, engaged. Patient is confused at times, and forgetful. [name] denies any coping or mood concerns. Family is supportive overall, conversations to continue for increased support. 4. PERSONAL EMERGENCY PLAN:  Family will call 9-1-1 for emergencies. 5. COMMUNITY RESOURCES COORDINATION/ HEALTH CARE NAVIGATION:  [name] continues to coordinate and provide care. Frances Furbish has discharged patient. No upcoming appointments.  6. FINANCIAL/LEGAL CONCERNS/INTERVENTIONS:  None.     SOCIAL HX:  Social History   Tobacco Use  . Smoking status: Former Smoker    Packs/day: 0.75    Years: 70.00    Pack years: 52.50    Types: Cigarettes    Start date: 12/13/1948   Quit date: 07/26/2016    Years since quitting: 2.9  . Smokeless tobacco: Never Used  Substance Use Topics  . Alcohol use: No    Alcohol/week: 0.0 standard drinks    CODE STATUS:   Code Status: Prior (DNR) ADVANCED DIRECTIVES: N MOST FORM COMPLETE:  Yes. HOSPICE EDUCATION PROVIDED: None.  PPS: Patient needs assistance with bathing and dressing. Patient is able to feed and toliet herself. Patient is using her walker to ambulate.  I spent65minutes with patient/family, from12:00-12:30pproviding education, support and consultation.  Richrd Sox, LCSW

## 2019-07-31 ENCOUNTER — Telehealth: Payer: Self-pay

## 2019-07-31 NOTE — Telephone Encounter (Signed)
Telephone call to schedule palliative care visit.  Patients daughter [name] in agreement with RN making home visit 08/07/19 at 12:30 PM.

## 2019-08-07 ENCOUNTER — Other Ambulatory Visit: Payer: Self-pay

## 2019-08-07 ENCOUNTER — Other Ambulatory Visit: Payer: Medicare Other

## 2019-08-07 DIAGNOSIS — Z515 Encounter for palliative care: Secondary | ICD-10-CM

## 2019-08-07 NOTE — Progress Notes (Signed)
PATIENT NAME: [name] DOB: Jun 09, 1929 MRN: [id number]  PRIMARY CARE PROVIDER: Lujean Amel, MD  RESPONSIBLE PARTY:  Acct ID - Guarantor Home Phone Work Phone Relationship Acct Type  [id number] - Colter,[phone]  Self P/F     [address], [place], [place] [address]    PLAN OF CARE and INTERVENTIONS:               1.  GOALS OF CARE/ ADVANCE CARE PLANNING:  Remain at home with daughter [name] and be comfortable.               2.  PATIENT/CAREGIVER EDUCATION:  Education on fall precautions, education on s/s of infection, support               3.  DISEASE STATUS:   RN made scheduled palliative care visit. RN met with patient and patients daughter [name]. Patient sitting up in bed. Patient smiling and pleasant and cooperative with assessment. Patient denies having any pain at the present time. Patients current weight is 85 lbs. Patient has been to see primary care provider, pulmonologist and neurologist per daughter. Daughter reports patient has had no changes in current medications. Patient receiving O2 via  nasal cannula at 2 L per minute. Patient alert to self and familiars.  Patient goes downstairs if daughter is at home but stays upstairs and does not attempt to navigate steps if she is home alone while daughter goes to the grocery store. Daughter reports she is planning on taking patient to [facility] next month as her daughter is having a baby. Patient continues to have shortness of breath on exertion. Daughter reports patient stays in bed some days and other days is up most of the day. Patient appetite fluctuates. Daughter reports last week patients appetite was poor but this week patient has been eating better.  Patient ambulates with her rolling walker. Patient has not suffered any recent falls. Daughter helps patient wash up through the week and patient gets in the tub on Saturdays. Patient has no edema in her lower extremities. Patient's bowel sounds are positive and all  four quadrants. Patient's breath sounds are diminished throughout. Patient and daughter remain in agreement with palliative care services. Patient and daughter encouraged to contact palliative care with questions or concerns.    HISTORY OF PRESENT ILLNESS: Patient is a 86 year old female who resides in home with her daughter [name].  Patient is followed by palliative care and is seen monthly and PRN.   CODE STATUS: DNR  ADVANCED DIRECTIVES: Y MOST FORM: Yes PPS: 40%   PHYSICAL EXAM:   VITALS: Today's Vitals   08/07/19 1250  BP: 140/80  Pulse: 70  Resp: 16  Temp: 98 F (36.7 C)  TempSrc: Temporal  SpO2: 97%  Weight: 85 lb (38.6 kg)  PainSc: 0-No pain    LUNGS: decreased breath sounds CARDIAC: Cor RRR EXTREMITIES: no edema SKIN: skin color, texture normal.  No rashes or lesions  NEURO: positive for memory problems and weakness       Nilda Simmer, RN

## 2019-09-03 ENCOUNTER — Telehealth: Payer: Self-pay

## 2019-09-03 NOTE — Telephone Encounter (Signed)
SW called to confirm appointment. Spoke with [name] (patient's daughter). Patient and [name] are currently in [place] awaiting the birth of patient's great grandchild. Unsure of when they will return. Palliative care team will follow-up.

## 2019-09-04 ENCOUNTER — Other Ambulatory Visit: Payer: Medicare Other

## 2019-09-06 ENCOUNTER — Other Ambulatory Visit: Payer: Self-pay | Admitting: *Deleted

## 2019-09-06 MED ORDER — ALBUTEROL SULFATE HFA 108 (90 BASE) MCG/ACT IN AERS: 2.0000 | INHALATION_SPRAY | Freq: Four times a day (QID) | RESPIRATORY_TRACT | 5 refills | Status: DC | PRN

## 2019-09-09 ENCOUNTER — Telehealth: Payer: Self-pay | Admitting: Pulmonary Disease

## 2019-09-09 MED ORDER — ALBUTEROL SULFATE (2.5 MG/3ML) 0.083% IN NEBU: 2.5000 mg | INHALATION_SOLUTION | RESPIRATORY_TRACT | 0 refills | Status: DC | PRN

## 2019-09-09 NOTE — Telephone Encounter (Signed)
Left message for [name] to call back.

## 2019-09-09 NOTE — Telephone Encounter (Signed)
[name] returning a phone call./ [name] can be reached at [phone].

## 2019-09-09 NOTE — Telephone Encounter (Signed)
Spoke with patient's daughter. She stated that her mother is currently out of town with her and she forgot her albuterol solution. She will not be back home for a few weeks and wanted to know if we could call in a RX to a pharmacy near she is now. I advised her that we would.   RX has been sent to [facility] in [place].   Nothing further needed at time of call.

## 2019-09-09 NOTE — Telephone Encounter (Signed)
Pharmacy is [facility] in [place], [place].

## 2019-09-10 DIAGNOSIS — R64 Cachexia: Secondary | ICD-10-CM | POA: Diagnosis present

## 2019-09-10 DIAGNOSIS — J189 Pneumonia, unspecified organism: Secondary | ICD-10-CM | POA: Diagnosis not present

## 2019-09-10 DIAGNOSIS — I5023 Acute on chronic systolic (congestive) heart failure: Secondary | ICD-10-CM | POA: Diagnosis not present

## 2019-09-10 DIAGNOSIS — I428 Other cardiomyopathies: Secondary | ICD-10-CM | POA: Diagnosis present

## 2019-09-10 DIAGNOSIS — R7989 Other specified abnormal findings of blood chemistry: Secondary | ICD-10-CM | POA: Diagnosis not present

## 2019-09-10 DIAGNOSIS — J441 Chronic obstructive pulmonary disease with (acute) exacerbation: Secondary | ICD-10-CM | POA: Diagnosis present

## 2019-09-10 DIAGNOSIS — I340 Nonrheumatic mitral (valve) insufficiency: Secondary | ICD-10-CM | POA: Diagnosis present

## 2019-09-10 DIAGNOSIS — J9621 Acute and chronic respiratory failure with hypoxia: Secondary | ICD-10-CM | POA: Diagnosis not present

## 2019-09-10 DIAGNOSIS — R636 Underweight: Secondary | ICD-10-CM | POA: Diagnosis present

## 2019-09-10 DIAGNOSIS — D509 Iron deficiency anemia, unspecified: Secondary | ICD-10-CM | POA: Diagnosis present

## 2019-09-10 DIAGNOSIS — I480 Paroxysmal atrial fibrillation: Secondary | ICD-10-CM | POA: Diagnosis present

## 2019-09-10 DIAGNOSIS — I38 Endocarditis, valve unspecified: Secondary | ICD-10-CM | POA: Diagnosis not present

## 2019-09-10 DIAGNOSIS — Z9981 Dependence on supplemental oxygen: Secondary | ICD-10-CM | POA: Diagnosis not present

## 2019-09-10 DIAGNOSIS — Z20822 Contact with and (suspected) exposure to covid-19: Secondary | ICD-10-CM | POA: Diagnosis not present

## 2019-09-10 DIAGNOSIS — R0602 Shortness of breath: Secondary | ICD-10-CM | POA: Diagnosis not present

## 2019-09-10 DIAGNOSIS — R748 Abnormal levels of other serum enzymes: Secondary | ICD-10-CM | POA: Diagnosis not present

## 2019-09-10 DIAGNOSIS — I4891 Unspecified atrial fibrillation: Secondary | ICD-10-CM | POA: Diagnosis not present

## 2019-09-10 DIAGNOSIS — G309 Alzheimer's disease, unspecified: Secondary | ICD-10-CM | POA: Diagnosis present

## 2019-09-10 DIAGNOSIS — R778 Other specified abnormalities of plasma proteins: Secondary | ICD-10-CM | POA: Diagnosis not present

## 2019-09-10 DIAGNOSIS — I5043 Acute on chronic combined systolic (congestive) and diastolic (congestive) heart failure: Secondary | ICD-10-CM | POA: Diagnosis present

## 2019-09-10 DIAGNOSIS — I10 Essential (primary) hypertension: Secondary | ICD-10-CM | POA: Diagnosis not present

## 2019-09-10 DIAGNOSIS — Z66 Do not resuscitate: Secondary | ICD-10-CM | POA: Diagnosis not present

## 2019-09-10 DIAGNOSIS — Z87891 Personal history of nicotine dependence: Secondary | ICD-10-CM | POA: Diagnosis not present

## 2019-09-10 DIAGNOSIS — I452 Bifascicular block: Secondary | ICD-10-CM | POA: Diagnosis present

## 2019-09-10 DIAGNOSIS — I110 Hypertensive heart disease with heart failure: Secondary | ICD-10-CM | POA: Diagnosis present

## 2019-09-10 DIAGNOSIS — E785 Hyperlipidemia, unspecified: Secondary | ICD-10-CM | POA: Diagnosis not present

## 2019-09-10 DIAGNOSIS — F0280 Dementia in other diseases classified elsewhere without behavioral disturbance: Secondary | ICD-10-CM | POA: Diagnosis present

## 2019-09-10 DIAGNOSIS — Z681 Body mass index (BMI) 19 or less, adult: Secondary | ICD-10-CM | POA: Diagnosis not present

## 2019-09-10 DIAGNOSIS — J159 Unspecified bacterial pneumonia: Secondary | ICD-10-CM | POA: Diagnosis not present

## 2019-09-10 DIAGNOSIS — E119 Type 2 diabetes mellitus without complications: Secondary | ICD-10-CM | POA: Diagnosis present

## 2019-09-10 DIAGNOSIS — I21A1 Myocardial infarction type 2: Secondary | ICD-10-CM | POA: Diagnosis not present

## 2019-09-10 DIAGNOSIS — J440 Chronic obstructive pulmonary disease with acute lower respiratory infection: Secondary | ICD-10-CM | POA: Diagnosis present

## 2019-09-10 DIAGNOSIS — I517 Cardiomegaly: Secondary | ICD-10-CM | POA: Diagnosis not present

## 2019-09-10 DIAGNOSIS — I509 Heart failure, unspecified: Secondary | ICD-10-CM | POA: Diagnosis not present

## 2019-09-10 DIAGNOSIS — Z8673 Personal history of transient ischemic attack (TIA), and cerebral infarction without residual deficits: Secondary | ICD-10-CM | POA: Diagnosis not present

## 2019-09-16 ENCOUNTER — Telehealth: Payer: Self-pay | Admitting: Cardiovascular Disease

## 2019-09-16 MED ORDER — GENERIC EXTERNAL MEDICATION
Status: DC
Start: ? — End: 2019-09-16

## 2019-09-16 MED ORDER — INSULIN LISPRO 100 UNIT/ML ~~LOC~~ SOLN
1.00 | SUBCUTANEOUS | Status: DC
Start: 2019-09-14 — End: 2019-09-16

## 2019-09-16 MED ORDER — MEMANTINE HCL 10 MG PO TABS
10.00 | ORAL_TABLET | ORAL | Status: DC
Start: 2019-09-15 — End: 2019-09-16

## 2019-09-16 MED ORDER — RISAQUAD PO CAPS
1.00 | ORAL_CAPSULE | ORAL | Status: DC
Start: 2019-09-15 — End: 2019-09-16

## 2019-09-16 MED ORDER — ACETAMINOPHEN 325 MG PO TABS
650.00 | ORAL_TABLET | ORAL | Status: DC
Start: ? — End: 2019-09-16

## 2019-09-16 MED ORDER — THERA-M PO TABS
1.00 | ORAL_TABLET | ORAL | Status: DC
Start: 2019-09-15 — End: 2019-09-16

## 2019-09-16 MED ORDER — ATORVASTATIN CALCIUM 10 MG PO TABS
10.00 | ORAL_TABLET | ORAL | Status: DC
Start: 2019-09-14 — End: 2019-09-16

## 2019-09-16 MED ORDER — HYDROCODONE-ACETAMINOPHEN 5-325 MG PO TABS
1.00 | ORAL_TABLET | ORAL | Status: DC
Start: ? — End: 2019-09-16

## 2019-09-16 MED ORDER — ALBUTEROL SULFATE HFA 108 (90 BASE) MCG/ACT IN AERS
2.00 | INHALATION_SPRAY | RESPIRATORY_TRACT | Status: DC
Start: ? — End: 2019-09-16

## 2019-09-16 MED ORDER — ASPIRIN 81 MG PO TBEC
81.00 | DELAYED_RELEASE_TABLET | ORAL | Status: DC
Start: 2019-09-15 — End: 2019-09-16

## 2019-09-16 MED ORDER — IPRATROPIUM BROMIDE HFA 17 MCG/ACT IN AERS
2.00 | INHALATION_SPRAY | RESPIRATORY_TRACT | Status: DC
Start: 2019-09-14 — End: 2019-09-16

## 2019-09-16 MED ORDER — HYDRALAZINE HCL 20 MG/ML IJ SOLN
10.00 | INTRAMUSCULAR | Status: DC
Start: ? — End: 2019-09-16

## 2019-09-16 MED ORDER — CYANOCOBALAMIN 1000 MCG PO TABS
250.00 | ORAL_TABLET | ORAL | Status: DC
Start: 2019-09-15 — End: 2019-09-16

## 2019-09-16 MED ORDER — CARVEDILOL 6.25 MG PO TABS
12.50 | ORAL_TABLET | ORAL | Status: DC
Start: 2019-09-14 — End: 2019-09-16

## 2019-09-16 MED ORDER — FERROUS SULFATE 325 (65 FE) MG PO TBEC
325.00 | DELAYED_RELEASE_TABLET | ORAL | Status: DC
Start: 2019-09-15 — End: 2019-09-16

## 2019-09-16 MED ORDER — MELATONIN 3 MG PO TABS
3.00 | ORAL_TABLET | ORAL | Status: DC
Start: ? — End: 2019-09-16

## 2019-09-16 MED ORDER — FUROSEMIDE 20 MG PO TABS
20.00 | ORAL_TABLET | ORAL | Status: DC
Start: 2019-09-15 — End: 2019-09-16

## 2019-09-16 MED ORDER — ENOXAPARIN SODIUM 40 MG/0.4ML ~~LOC~~ SOLN
40.00 | SUBCUTANEOUS | Status: DC
Start: 2019-09-15 — End: 2019-09-16

## 2019-09-16 MED ORDER — DEXTROSE 50 % IV SOLN
25.00 | INTRAVENOUS | Status: DC
Start: ? — End: 2019-09-16

## 2019-09-16 MED ORDER — AZITHROMYCIN 250 MG PO TABS
250.00 | ORAL_TABLET | ORAL | Status: DC
Start: 2019-09-15 — End: 2019-09-16

## 2019-09-16 MED ORDER — FLUTICASONE-SALMETEROL 115-21 MCG/ACT IN AERO
2.00 | INHALATION_SPRAY | RESPIRATORY_TRACT | Status: DC
Start: 2019-09-14 — End: 2019-09-16

## 2019-09-16 MED ORDER — SPIRONOLACTONE 25 MG PO TABS
12.50 | ORAL_TABLET | ORAL | Status: DC
Start: 2019-09-15 — End: 2019-09-16

## 2019-09-16 MED ORDER — GLUCOSE 40 % PO GEL
1.00 | ORAL | Status: DC
Start: ? — End: 2019-09-16

## 2019-09-16 MED ORDER — DIGOXIN 125 MCG PO TABS
62.50 | ORAL_TABLET | ORAL | Status: DC
Start: 2019-09-15 — End: 2019-09-16

## 2019-09-16 MED ORDER — POTASSIUM CHLORIDE ER 10 MEQ PO TBCR
10.00 | EXTENDED_RELEASE_TABLET | ORAL | Status: DC
Start: 2019-09-15 — End: 2019-09-16

## 2019-09-16 MED ORDER — BISACODYL 5 MG PO TBEC
10.00 | DELAYED_RELEASE_TABLET | ORAL | Status: DC
Start: ? — End: 2019-09-16

## 2019-09-16 MED ORDER — INSULIN LISPRO 100 UNIT/ML ~~LOC~~ SOLN
3.00 | SUBCUTANEOUS | Status: DC
Start: 2019-09-14 — End: 2019-09-16

## 2019-09-16 MED ORDER — GLUCAGON (RDNA) 1 MG IJ KIT
1.00 | PACK | INTRAMUSCULAR | Status: DC
Start: ? — End: 2019-09-16

## 2019-09-16 MED ORDER — LISINOPRIL 5 MG PO TABS
10.00 | ORAL_TABLET | ORAL | Status: DC
Start: 2019-09-15 — End: 2019-09-16

## 2019-09-16 NOTE — Telephone Encounter (Signed)
Called patient's daughter [facility]) about medication question. Patient is on Coreg 12.5 mg BID, not benazepril. Daughter is not sure what dose and frequency on benazepril, so she will call tomorrow if the dose is different than what we have listed.

## 2019-09-16 NOTE — Telephone Encounter (Signed)
Pt c/o medication issue:  1. Name of Medication: benazepril (LOTENSIN) 10 MG tablet  2. How are you currently taking this medication (dosage and times per day)? 12.5mg   2x daily  3. Are you having a reaction (difficulty breathing--STAT)? no  4. What is your medication issue? Patients daughter states that the dosage was changed to 10mg  1x daily but she is still giving her mother 12.5mg  2x daily until she hears what Dr. thinks. Instructions on medication state 10mg  2x daily but unsure if she was told differently...please advise.

## 2019-09-18 ENCOUNTER — Telehealth: Payer: Self-pay

## 2019-09-18 NOTE — Telephone Encounter (Signed)
Phone call placed to patient's daughter, Tereso Newcomer, to check in. Tereso Newcomer provided update on patient being admitted to hospital in [place] with dx of Pneumonia. Patient was d/c from hospital on Saturday 09/14/19. Patient has appointments with PCP and Pulmonologist next week. Primary Palliative RN updated.

## 2019-09-24 DIAGNOSIS — Z7984 Long term (current) use of oral hypoglycemic drugs: Secondary | ICD-10-CM | POA: Diagnosis not present

## 2019-09-24 DIAGNOSIS — I4819 Other persistent atrial fibrillation: Secondary | ICD-10-CM | POA: Diagnosis not present

## 2019-09-24 DIAGNOSIS — E7800 Pure hypercholesterolemia, unspecified: Secondary | ICD-10-CM | POA: Diagnosis not present

## 2019-09-24 DIAGNOSIS — D649 Anemia, unspecified: Secondary | ICD-10-CM | POA: Diagnosis not present

## 2019-09-24 DIAGNOSIS — Z79899 Other long term (current) drug therapy: Secondary | ICD-10-CM | POA: Diagnosis not present

## 2019-09-24 DIAGNOSIS — J449 Chronic obstructive pulmonary disease, unspecified: Secondary | ICD-10-CM | POA: Diagnosis not present

## 2019-09-24 DIAGNOSIS — E43 Unspecified severe protein-calorie malnutrition: Secondary | ICD-10-CM | POA: Diagnosis not present

## 2019-09-24 DIAGNOSIS — J189 Pneumonia, unspecified organism: Secondary | ICD-10-CM | POA: Diagnosis not present

## 2019-09-24 DIAGNOSIS — E1169 Type 2 diabetes mellitus with other specified complication: Secondary | ICD-10-CM | POA: Diagnosis not present

## 2019-09-24 DIAGNOSIS — I10 Essential (primary) hypertension: Secondary | ICD-10-CM | POA: Diagnosis not present

## 2019-09-24 NOTE — Progress Notes (Signed)
@Patient  ID: , female    DOB: Jul 30, 1929, 86 y.o.   MRN: [id number]  Chief Complaint  Patient presents with  . Follow-up    sob-better, denies cough or cp    Referring provider: [id number], MD  HPI:  86 year old female former smoker (quit April/2018) followed in our office with severe COPD and emphysema, pulmonary hypertension  PMH: Diastolic congestive heart failure, PAF, underweight and failure to thrive Smoker/ Smoking History: Former smoker.  Greater than 65-pack-year smoking history.  Quit April/2018. Maintenance: Symbicort 160, Spiriva 2.5 Pt of: Dr. May/2018  09/25/2019  - Visit   86 year old female former smoker followed in our office for severe COPD emphysema and pulmonary hypertension.  Patient is followed by Dr. 97.  Patient was last evaluated in our office in January/2021.  At that time it was recommended that she follow-up in 6 months, she be maintained on her respiratory medications.  Also a February/2021 form as well as a DNR form were provided for the patient for advanced care planning.  She was encouraged to keep follow-up with cardiology.  Patient unfortunately was hospitalized at the [facility] health [facility] in [facility].  Chart review reveals:  09/10/2019-CTA chest-no PE, moderate to advanced emphysema, patchy airspace disease posterior to right lower lobe suspicious for developing pneumonia  09/11/2019-lower extremity DVT Doppler bilateral-no sonographic evidence of DVT  09/11/2019-SARS-CoV-2-negative  Per admission notes it appears the patient was admitted on 09/10/2019 for suspected COPD exacerbation as well as discharged on 09/14/2019.  An excerpt of that discharge summary is listed below:  [facility] MEDICINE DISCHARGE SUMMARY  NAME: [name] MRN: Britt Bolognese DOB: [birth year] ADMIT Date: 09/10/2019 2:48 PM  DISCHARGE Date: 09/14/2019 Room: 252/252-01  Attending of Record: 03-20-2000,  MD Admitting Physician: Sherie Don, MD Discharge Physician: Norville Haggard, MD Outpatient PCP: Provider Not In System, MD  Reason for hospitalization: bacterial pneumonia requiring iv antibiotics  Discharge Diagnosis: Principal Problem: Pneumonia Active Problems: COPD exacerbation (HCC) PAF (paroxysmal atrial fibrillation) (HCC) Type 2 diabetes mellitus (HCC) Elevated d-dimer Anemia Acute on chronic systolic heart failure (HCC) Type 2 myocardial infarction (HCC) Severe mitral regurgitation Resolved Problems: * No resolved hospital problems. *  Procedures Performed: iv antibiotics  Hospital Course:  The following active issues were addressed during this admission:  Pneumonia  Improved  Cont abx  Change to po abx  On 2lpm o2 at home  Combined systolic and diastolic CHF  Echo on 06/20/19 : EF 30-35 %  Pro BMP is 29,981  Diurese  appreciate cards input  Improved  Likely due to catecholamine release from pna  Elevated troponin  Flat and improved  monitor  Elevated D-dimer  Ct chest no pe  Negative 06/22/19 legs  History of atrial fibrillation  Digoxin 0.125 mg, takes half tablet every day  Per cardiology note dated 06/05/2019 by Dr. 08/03/2019 under Care everywhere, patient is not on anticoagulation due to end-stage  Diabetes mellitus type 2  Stable in goal range  HTN  stable  COPD  No AE  On 2lpm at home o2  O2 needs improved in hospital  Leukocytosis  Follow trend  Microcytic anemia  Monitor  History of Alzheimer's disease  Patient was seen by Neurology on 06/11/2019 at [place], [place]    Patient presenting to our office today reporting that her breathing is doing better since being discharged in the hospital on 09/14/2019.  She has not had any episodes of fever.  She continues to have improvements with her appetite as well as with fatigue.  She feels that she is slowly getting back to baseline.  She is  maintained on 2 L of O2 with oxygen saturations at 98%.  Patient recently followed up with her primary care doctor.  They are working on getting her set up with physical therapy.  No acute respiratory complaints today.  They do need refills of their rescue inhaler.  For some reason this was denied by insurance.  We are unsure why.  Patient has enough albuterol nebs.  She reports adherence to her inhalers.  Questionaires / Pulmonary Flowsheets:   ACT:  No flowsheet data found.  MMRC: No flowsheet data found.  Epworth:  No flowsheet data found.  Tests:   Chest Imaging: Chest x-ray 04/24/2018: Left basilar infiltrate.  Pulmonary Functions Testing Results: No flowsheet data found.  Office Spiro: FEV1 0.7L, 44% predicted  FeNO: None   Pathology: None   Echocardiogram: None   Heart Catheterization:  07/2016: Study Conclusions - Left ventricle: Diffuse hypokinesis abnormal septal motion The   cavity size was mildly dilated. Wall thickness was normal.   Systolic function was mildly to moderately reduced. The estimated   ejection fraction was in the range of 40% to 45%. - Aortic valve: Valve area (VTI): 1.49 cm^2. Valve area (Vmax):   1.61 cm^2. Valve area (Vmean): 1.46 cm^2. - Aorta: calcification of the intervalvular fibrosa and aortic   sinus. - Mitral valve: There was mild regurgitation. - Atrial septum: A patent foramen ovale cannot be excluded.  FENO:  No results found for: NITRICOXIDE  PFT: No flowsheet data found.  WALK:  SIX MIN WALK 07/11/2016 05/27/2015  Supplimental Oxygen during Test? (L/min) - No  Tech Comments: steady walk, pt started to stagger when O2 would decrease/TA Pt did not want to continue to 3rd lap due to fatigue and dyspnea.     Imaging: No results found.  Lab Results:  CBC    Component Value Date/Time   WBC 11.7 (H) 05/12/2019 0219   RBC 3.30 (L) 05/12/2019 0219   HGB 10.1 (L) 05/12/2019 0219   HGB 12.6 05/24/2016 0924   HCT 32.3  (L) 05/12/2019 0219   HCT 37.9 05/24/2016 0924   PLT 134 (L) 05/12/2019 0219   PLT 241 05/24/2016 0924   MCV 97.9 05/12/2019 0219   MCV 95 05/24/2016 0924   MCH 30.6 05/12/2019 0219   MCHC 31.3 05/12/2019 0219   RDW 14.2 05/12/2019 0219   RDW 13.8 05/24/2016 0924   LYMPHSABS 2.7 09/14/2016 1237   MONOABS 1.1 (H) 09/14/2016 1237   EOSABS 0.1 09/14/2016 1237   BASOSABS 0.1 09/14/2016 1237    BMET    Component Value Date/Time   NA 143 05/14/2019 0236   NA 135 (A) 08/04/2016 0000   K 4.2 05/14/2019 0236   CL 103 05/14/2019 0236   CO2 32 05/14/2019 0236   GLUCOSE 84 05/14/2019 0236   BUN 20 05/14/2019 0236   BUN 21 08/04/2016 0000   CREATININE 0.59 05/14/2019 0236   CALCIUM 8.9 05/14/2019 0236   GFRNONAA >60 05/14/2019 0236   GFRAA >60 05/14/2019 0236    BNP    Component Value Date/Time   BNP 1,267.3 (H) 05/09/2019 0627    ProBNP    Component Value Date/Time   PROBNP 70.0 09/14/2016 1237    Specialty Problems      Pulmonary Problems   Acute bronchitis   Chronic obstructive pulmonary disease (HCC)  Chronic respiratory failure with hypoxia (HCC)   COPD mixed type (HCC)    05/27/2015-office spirometry-FVC 1.4 (66% predicted), ratio 48, FEV1 0.7 (44% predicted)  10/20/2017-CT chest without contrast-no acute abnormality, no evidence of pneumonia, mild centrilobular emphysema      COPD with acute exacerbation (HCC)   Lobar pneumonia, unspecified organism (HCC)      No Known Allergies  Immunization History  Administered Date(s) Administered  . Fluad Quad(high Dose 65+) 01/21/2019  . Influenza Split 01/23/2017  . Influenza, High Dose Seasonal PF 02/05/2018  . Influenza,inj,Quad PF,6+ Mos 01/24/2015, 01/24/2016  . PFIZER SARS-COV-2 Vaccination 06/08/2019, 07/12/2019  . Pneumococcal-Unspecified 12/25/2014    Past Medical History:  Diagnosis Date  . Acute bronchitis   . Acute on chronic respiratory failure with hypoxia (HCC)   . Alzheimer disease (HCC)  06/04/2018  . Benign hypertensive renal disease   . CAD (coronary artery disease)   . Cardiomyopathy    non-ischemic 04/2008 EF 45% cardiac MRI no scar  . CHF (congestive heart failure) (HCC)   . Chronic sinusitis   . COPD (chronic obstructive pulmonary disease) (HCC)   . COPD exacerbation (HCC)   . Depression   . Diabetes mellitus without complication, without long-term current use of insulin (HCC)   . Dyslipidemia associated with type 2 diabetes mellitus (HCC)   . Dyspnea on exertion    chronic  . Heart disease   . Hypercholesterolemia   . Hypertension   . On home oxygen therapy    "3L; 24/7; for the last 2 1/2 weeks; none before that" (07/26/2016)  . Protein-calorie malnutrition, severe (HCC)   . Sinus headache    "chronic"  . Stroke [facility]) 1997   "mild"  . Type II diabetes mellitus (HCC)     Tobacco History: Social History   Tobacco Use  Smoking Status Former Smoker  . Packs/day: 0.75  . Years: 70.00  . Pack years: 52.50  . Types: Cigarettes  . Start date: 12/13/1948  . Quit date: 07/26/2016  . Years since quitting: 3.1  Smokeless Tobacco Never Used   Counseling given: Yes   Continue to not smoke  Outpatient Encounter Medications as of 09/25/2019  Medication Sig  . albuterol (PROVENTIL) (2.5 MG/3ML) 0.083% nebulizer solution Take 3 mLs (2.5 mg total) by nebulization every 4 (four) hours as needed for wheezing or shortness of breath.  Marland Kitchen albuterol (VENTOLIN HFA) 108 (90 Base) MCG/ACT inhaler Inhale 2 puffs into the lungs every 6 (six) hours as needed for wheezing or shortness of breath.  Marland Kitchen aspirin EC 81 MG tablet Take 81 mg by mouth daily.  Marland Kitchen atorvastatin (LIPITOR) 10 MG tablet TAKE 1 TABLET(10 MG) BY MOUTH DAILY AT 6 PM (Patient taking differently: Take 10 mg by mouth daily at 6 PM. )  . benazepril (LOTENSIN) 10 MG tablet Take 10 mg by mouth 2 (two) times daily.   . budesonide-formoterol (SYMBICORT) 160-4.5 MCG/ACT inhaler INHALE 2 PUFFS INTO THE LUNGS TWICE DAILY  .  carvedilol (COREG) 12.5 MG tablet Take 1 tablet (12.5 mg total) by mouth 2 (two) times daily with a meal.  . digoxin (LANOXIN) 0.125 MG tablet TAKE 1/2 TABLET BY MOUTH EVERY DAY. PLEASE KEEP APPT IN SEPT FOR FUTURE REFILLS (Patient taking differently: Take 0.0625 mg by mouth daily. )  . feeding supplement (BOOST HIGH PROTEIN) LIQD Take 1 Container by mouth as needed (between meal).  . ferrous sulfate 325 (65 FE) MG tablet Take 325 mg by mouth daily with breakfast.  . fluticasone (  FLONASE) 50 MCG/ACT nasal spray Place 1 spray into both nostrils daily as needed for allergies.  . furosemide (LASIX) 20 MG tablet TAKE 1 TABLET BY MOUTH ONCE DAILY (Patient taking differently: Take 20 mg by mouth daily. )  . guaiFENesin (MUCINEX) 600 MG 12 hr tablet Take 1 tablet (600 mg total) by mouth 2 (two) times daily.  . Lactobacillus (PROBIOTIC ACIDOPHILUS PO) Take 1 capsule by mouth daily.  . memantine (NAMENDA) 10 MG tablet TAKE 1 TABLET(10 MG) BY MOUTH TWICE DAILY (Patient taking differently: Take 10 mg by mouth 2 (two) times daily. )  . metFORMIN (GLUCOPHAGE-XR) 500 MG 24 hr tablet Take 1,000 mg by mouth 2 (two) times daily.   . Multiple Vitamin (MV-ONE PO) Take 1 tablet by mouth daily.  . Tiotropium Bromide Monohydrate (SPIRIVA RESPIMAT) 2.5 MCG/ACT AERS Inhale 2 puffs into the lungs daily.  . vitamin B-12 (CYANOCOBALAMIN) 500 MCG tablet Take 1,000 mcg by mouth daily.   . [DISCONTINUED] albuterol (VENTOLIN HFA) 108 (90 Base) MCG/ACT inhaler Inhale 2 puffs into the lungs every 6 (six) hours as needed for wheezing or shortness of breath.  . Spacer/Aero-Holding Chambers (AEROCHAMBER MV) inhaler Use as instructed (Patient not taking: Reported on 09/25/2019)  . tamsulosin (FLOMAX) 0.4 MG CAPS capsule Take 1 capsule (0.4 mg total) by mouth daily. (Patient not taking: Reported on 09/25/2019)   No facility-administered encounter medications on file as of 09/25/2019.     Review of Systems  Review of Systems    Constitutional: Positive for activity change and fatigue. Negative for appetite change and fever.  HENT: Negative for sinus pressure, sinus pain and sore throat.   Respiratory: Positive for shortness of breath. Negative for cough and wheezing.   Cardiovascular: Negative for chest pain and palpitations.  Gastrointestinal: Negative for diarrhea, nausea and vomiting.  Musculoskeletal: Negative for arthralgias.  Neurological: Negative for dizziness.  Psychiatric/Behavioral: Negative for sleep disturbance. The patient is not nervous/anxious.      Physical Exam  BP 118/62 (BP Location: Right Arm, Cuff Size: Small)   Pulse 74   Temp 98.2 F (36.8 C) (Oral)   Ht 5\' 3"  (1.6 m)   Wt 86 lb (39 kg)   SpO2 98%   BMI 15.23 kg/m   Wt Readings from Last 5 Encounters:  09/25/19 86 lb (39 kg)  08/07/19 85 lb (38.6 kg)  06/11/19 86 lb 3.2 oz (39.1 kg)  06/05/19 89 lb (40.4 kg)  05/28/19 89 lb 14.4 oz (40.8 kg)    BMI Readings from Last 5 Encounters:  09/25/19 15.23 kg/m  08/07/19 15.06 kg/m  06/11/19 15.27 kg/m  06/05/19 15.77 kg/m  05/28/19 15.93 kg/m     Physical Exam Vitals and nursing note reviewed.  Constitutional:      General: She is not in acute distress.    Comments: Thin frail elderly female  HENT:     Head: Normocephalic and atraumatic.     Right Ear: External ear normal.     Left Ear: External ear normal.  Eyes:     Pupils: Pupils are equal, round, and reactive to light.  Cardiovascular:     Rate and Rhythm: Normal rate. Rhythm regularly irregular.     Pulses: Normal pulses.     Heart sounds: Normal heart sounds. No murmur.  Pulmonary:     Effort: Pulmonary effort is normal. No respiratory distress.     Breath sounds: Normal breath sounds. No decreased air movement. No decreased breath sounds, wheezing or rales.  Abdominal:  General: Abdomen is flat. Bowel sounds are normal. There is no distension.     Palpations: Abdomen is soft. There is no mass.   Musculoskeletal:     Cervical back: Normal range of motion.     Right lower leg: No edema.     Left lower leg: No edema.  Skin:    General: Skin is warm and dry.     Capillary Refill: Capillary refill takes less than 2 seconds.  Neurological:     General: No focal deficit present.     Mental Status: She is alert and oriented to person, place, and time. Mental status is at baseline.     Motor: Weakness present.     Gait: Gait (in wheelchair ) normal.  Psychiatric:        Mood and Affect: Mood normal.        Behavior: Behavior normal.        Thought Content: Thought content normal.        Judgment: Judgment normal.       Assessment & Plan:   Lobar pneumonia, unspecified organism [facility]) CTA chest from May/2021 hospitalization in [place] showed right lower lobe pneumonia Patient treated with antibiotics  Plan: We will see patient back in 6 weeks with chest x-ray  Chronic diastolic heart failure ([place]) Plan: Keep follow-up with cardiology Continue medications as managed by cardiology  COPD mixed type ([place]) Plan: Continue Symbicort 160 Continue Spiriva Respimat 2.5 We will send in refill of rescue inhaler to be used as needed Continue to use albuterol nebs as needed 6-week follow-up with chest x-ray  Chronic respiratory failure with hypoxia ([place]) Plan: Continue oxygen therapy as prescribed  Weakness Sent hospitalization and pneumonia Patient working with primary care to get set up with physical therapy  Plan: Continue forward with physical therapy  Alzheimer disease ([place]) Plan: Continue to work with neurology  Protein-calorie malnutrition, severe BMI 15.23 Thin cachectic elderly female Reports good appetite which is improving since recent hospitalization with pneumonia and supplementing with Ensure high-protein boost as snacks in between meals  Plan: Continue dietary recommendations as you are currently following    Return in about 6 weeks (around  11/06/2019), or if symptoms worsen or fail to improve, for Follow up with Dr. Valeta Harms, Follow up with Wyn Quaker FNP-C, With Chest Xray.   Lauraine Rinne, NP 09/25/2019   This appointment required 42 minutes of patient care (this includes precharting, chart review, review of results, face-to-face care, etc.).

## 2019-09-25 ENCOUNTER — Telehealth: Payer: Self-pay

## 2019-09-25 ENCOUNTER — Encounter: Payer: Self-pay | Admitting: Pulmonary Disease

## 2019-09-25 ENCOUNTER — Other Ambulatory Visit: Payer: Self-pay

## 2019-09-25 ENCOUNTER — Ambulatory Visit (INDEPENDENT_AMBULATORY_CARE_PROVIDER_SITE_OTHER): Payer: Medicare Other | Admitting: Pulmonary Disease

## 2019-09-25 VITALS — BP 118/62 | HR 74 | Temp 98.2°F | Ht 63.0 in | Wt 86.0 lb

## 2019-09-25 DIAGNOSIS — I5032 Chronic diastolic (congestive) heart failure: Secondary | ICD-10-CM | POA: Diagnosis not present

## 2019-09-25 DIAGNOSIS — J181 Lobar pneumonia, unspecified organism: Secondary | ICD-10-CM | POA: Diagnosis not present

## 2019-09-25 DIAGNOSIS — F0280 Dementia in other diseases classified elsewhere without behavioral disturbance: Secondary | ICD-10-CM | POA: Diagnosis not present

## 2019-09-25 DIAGNOSIS — J9611 Chronic respiratory failure with hypoxia: Secondary | ICD-10-CM | POA: Diagnosis not present

## 2019-09-25 DIAGNOSIS — E43 Unspecified severe protein-calorie malnutrition: Secondary | ICD-10-CM | POA: Diagnosis not present

## 2019-09-25 DIAGNOSIS — R531 Weakness: Secondary | ICD-10-CM

## 2019-09-25 DIAGNOSIS — G301 Alzheimer's disease with late onset: Secondary | ICD-10-CM

## 2019-09-25 DIAGNOSIS — J449 Chronic obstructive pulmonary disease, unspecified: Secondary | ICD-10-CM | POA: Diagnosis not present

## 2019-09-25 MED ORDER — ALBUTEROL SULFATE HFA 108 (90 BASE) MCG/ACT IN AERS: 2.0000 | INHALATION_SPRAY | Freq: Four times a day (QID) | RESPIRATORY_TRACT | 5 refills | Status: AC | PRN

## 2019-09-25 NOTE — Assessment & Plan Note (Signed)
Plan: Continue to work with neurology

## 2019-09-25 NOTE — Telephone Encounter (Signed)
Phone call placed to patient's daughter to check in and offer to schedule a visit with Palliative care. Visit scheduled for 10/03/19 @ 11:00am.

## 2019-09-25 NOTE — Assessment & Plan Note (Signed)
BMI 15.23 Thin cachectic elderly female Reports good appetite which is improving since recent hospitalization with pneumonia and supplementing with Ensure high-protein boost as snacks in between meals  Plan: Continue dietary recommendations as you are currently following

## 2019-09-25 NOTE — Assessment & Plan Note (Signed)
Plan: Keep follow-up with cardiology Continue medications as managed by cardiology

## 2019-09-25 NOTE — Assessment & Plan Note (Signed)
CTA chest from May/2021 hospitalization in [place] showed right lower lobe pneumonia Patient treated with antibiotics  Plan: We will see patient back in 6 weeks with chest x-ray

## 2019-09-25 NOTE — Assessment & Plan Note (Signed)
Plan: Continue Symbicort 160 Continue Spiriva Respimat 2.5 We will send in refill of rescue inhaler to be used as needed Continue to use albuterol nebs as needed 6-week follow-up with chest x-ray

## 2019-09-25 NOTE — Patient Instructions (Addendum)
You were seen today by Lauraine Rinne, NP  for:  1. Lobar pneumonia, unspecified organism ([place])  - DG Chest 2 View; Future  Please let our office know if he have worsening cough, congestion, fatigue, fevers  We will see you back in about 6 weeks with a chest x-ray  2. COPD mixed type (HCC)  Continue Symbicort 160 >>> 2 puffs in the morning right when you wake up, rinse out your mouth after use, 12 hours later 2 puffs, rinse after use >>> Take this daily, no matter what >>> This is not a rescue inhaler   Spiriva Respimat 2.5 >>> 2 puffs daily >>> Do this every day >>>This is not a rescue inhaler  Note your daily symptoms > remember "red flags" for COPD:   >>>Increase in cough >>>increase in sputum production >>>increase in shortness of breath or activity  intolerance.   If you notice these symptoms, please call the office to be seen.   Only use your albuterol as a rescue medication to be used if you can't catch your breath by resting or doing a relaxed purse lip breathing pattern.  - The less you use it, the better it will work when you need it. - Ok to use up to 2 puffs  every 4 hours if you must but call for immediate appointment if use goes up over your usual need - Don't leave home without it !!  (think of it like the spare tire for your car)    3. Chronic diastolic heart failure (HCC)  Continue to follow-up with cardiology  Continue medications as managed by cardiology  4. Chronic respiratory failure with hypoxia (HCC)  Continue oxygen therapy as prescribed  >>>maintain oxygen saturations greater than 88 percent  >>>if unable to maintain oxygen saturations please contact the office  >>>do not smoke with oxygen  >>>can use nasal saline gel or nasal saline rinses to moisturize nose if oxygen causes dryness  5. Protein-calorie malnutrition, severe  Continue to utilize high-protein boost or Ensure in between meals  6. Weakness   Agree with working with primary  care to get started with physical therapy   We recommend today:  Orders Placed This Encounter  Procedures  . DG Chest 2 View    Standing Status:   Future    Standing Expiration Date:   01/25/2020    Order Specific Question:   Reason for Exam (SYMPTOM  OR DIAGNOSIS REQUIRED)    Answer:   follow up R[facility] pnu from 5/18 CTA chest    Order Specific Question:   Preferred imaging location?    Answer:   Internal    Order Specific Question:   Radiology Contrast Protocol - do NOT remove file path    Answer:   [email or web address]   Orders Placed This Encounter  Procedures  . DG Chest 2 View   Meds ordered this encounter  Medications  . albuterol (VENTOLIN HFA) 108 (90 Base) MCG/ACT inhaler    Sig: Inhale 2 puffs into the lungs every 6 (six) hours as needed for wheezing or shortness of breath.    Dispense:  8 g    Refill:  5    Please prescribe inhaler that is covered with pt's insurance.    Follow Up:    Return in about 6 weeks (around 11/06/2019), or if symptoms worsen or fail to improve, for Follow up with Dr. Valeta Harms, Follow up with Wyn Quaker FNP-C, With Chest Xray.   Please do your part to reduce  the spread of COVID-19:      Reduce your risk of any infection  and COVID19 by using the similar precautions used for avoiding the common cold or flu:  Marland Kitchen Wash your hands often with soap and warm water for at least 20 seconds.  If soap and water are not readily available, use an alcohol-based hand sanitizer with at least 60% alcohol.  . If coughing or sneezing, cover your mouth and nose by coughing or sneezing into the elbow areas of your shirt or coat, into a tissue or into your sleeve (not your hands). Drinda Butts A MASK when in public  . Avoid shaking hands with others and consider head nods or verbal greetings only. . Avoid touching your eyes, nose, or mouth with unwashed hands.  . Avoid close contact with people who are sick. . Avoid places or events  with large numbers of people in one location, like concerts or sporting events. . If you have some symptoms but not all symptoms, continue to monitor at home and seek medical attention if your symptoms worsen. . If you are having a medical emergency, call 911.   ADDITIONAL HEALTHCARE OPTIONS FOR PATIENTS  [facility] / e-Visit: [email or web address]         [facility]: [phone]  [facility]: [phone]                   [facility]: [phone]     It is flu season:   >>> Best ways to protect herself from the flu: Receive the yearly flu vaccine, practice good hand hygiene washing with soap and also using hand sanitizer when available, eat a nutritious meals, get adequate rest, hydrate appropriately   Please contact the office if your symptoms worsen or you have concerns that you are not improving.   Thank you for choosing [facility] for your healthcare, and for allowing [place] to partner with you on your healthcare journey. I am thankful to be able to provide care to you today.   Elisha Headland FNP-C

## 2019-09-25 NOTE — Assessment & Plan Note (Signed)
Sent hospitalization and pneumonia Patient working with primary care to get set up with physical therapy  Plan: Continue forward with physical therapy

## 2019-09-25 NOTE — Assessment & Plan Note (Signed)
Plan: Continue oxygen therapy as prescribed 

## 2019-09-26 ENCOUNTER — Emergency Department (HOSPITAL_COMMUNITY)
Admission: EM | Admit: 2019-09-26 | Discharge: 2019-09-27 | Disposition: A | Discharge status: Home / Self Care | Payer: Medicare Other | Attending: Emergency Medicine | Admitting: Emergency Medicine

## 2019-09-26 ENCOUNTER — Emergency Department (HOSPITAL_COMMUNITY): Payer: Medicare Other

## 2019-09-26 ENCOUNTER — Encounter (HOSPITAL_COMMUNITY): Payer: Self-pay

## 2019-09-26 DIAGNOSIS — G309 Alzheimer's disease, unspecified: Secondary | ICD-10-CM | POA: Diagnosis not present

## 2019-09-26 DIAGNOSIS — Z7982 Long term (current) use of aspirin: Secondary | ICD-10-CM | POA: Insufficient documentation

## 2019-09-26 DIAGNOSIS — I110 Hypertensive heart disease with heart failure: Secondary | ICD-10-CM | POA: Insufficient documentation

## 2019-09-26 DIAGNOSIS — J449 Chronic obstructive pulmonary disease, unspecified: Secondary | ICD-10-CM | POA: Insufficient documentation

## 2019-09-26 DIAGNOSIS — Z7984 Long term (current) use of oral hypoglycemic drugs: Secondary | ICD-10-CM | POA: Diagnosis not present

## 2019-09-26 DIAGNOSIS — Z79899 Other long term (current) drug therapy: Secondary | ICD-10-CM | POA: Insufficient documentation

## 2019-09-26 DIAGNOSIS — R42 Dizziness and giddiness: Secondary | ICD-10-CM | POA: Diagnosis not present

## 2019-09-26 DIAGNOSIS — R531 Weakness: Secondary | ICD-10-CM | POA: Diagnosis not present

## 2019-09-26 DIAGNOSIS — I2510 Atherosclerotic heart disease of native coronary artery without angina pectoris: Secondary | ICD-10-CM | POA: Diagnosis not present

## 2019-09-26 DIAGNOSIS — Z87891 Personal history of nicotine dependence: Secondary | ICD-10-CM | POA: Diagnosis not present

## 2019-09-26 DIAGNOSIS — I5040 Unspecified combined systolic (congestive) and diastolic (congestive) heart failure: Secondary | ICD-10-CM | POA: Diagnosis not present

## 2019-09-26 DIAGNOSIS — E119 Type 2 diabetes mellitus without complications: Secondary | ICD-10-CM | POA: Insufficient documentation

## 2019-09-26 LAB — CBC
HCT: 36.1 % (ref 36.0–46.0)
Hemoglobin: 11.1 g/dL — ABNORMAL LOW (ref 12.0–15.0)
MCH: 30.7 pg (ref 26.0–34.0)
MCHC: 30.7 g/dL (ref 30.0–36.0)
MCV: 100 fL (ref 80.0–100.0)
Platelets: 237 10*3/uL (ref 150–400)
RBC: 3.61 MIL/uL — ABNORMAL LOW (ref 3.87–5.11)
RDW: 14.1 % (ref 11.5–15.5)
WBC: 8.7 10*3/uL (ref 4.0–10.5)
nRBC: 0 % (ref 0.0–0.2)

## 2019-09-26 LAB — COMPREHENSIVE METABOLIC PANEL
ALT: 19 U/L (ref 0–44)
AST: 20 U/L (ref 15–41)
Albumin: 4 g/dL (ref 3.5–5.0)
Alkaline Phosphatase: 49 U/L (ref 38–126)
Anion gap: 16 — ABNORMAL HIGH (ref 5–15)
BUN: 17 mg/dL (ref 8–23)
CO2: 25 mmol/L (ref 22–32)
Calcium: 9.7 mg/dL (ref 8.9–10.3)
Chloride: 98 mmol/L (ref 98–111)
Creatinine, Ser: 0.97 mg/dL (ref 0.44–1.00)
GFR calc Af Amer: >60 mL/min (ref >60)
GFR calc non Af Amer: 53 mL/min — ABNORMAL LOW (ref >60)
Glucose, Bld: 107 mg/dL — ABNORMAL HIGH (ref 70–99)
Potassium: 4.2 mmol/L (ref 3.5–5.1)
Sodium: 139 mmol/L (ref 135–145)
Total Bilirubin: 0.5 mg/dL (ref 0.3–1.2)
Total Protein: 7.7 g/dL (ref 6.5–8.1)

## 2019-09-26 LAB — I-STAT CHEM 8, ED
BUN: 20 mg/dL (ref 8–23)
Calcium, Ion: 1.11 mmol/L — ABNORMAL LOW (ref 1.15–1.40)
Chloride: 99 mmol/L (ref 98–111)
Creatinine, Ser: 1 mg/dL (ref 0.44–1.00)
Glucose, Bld: 102 mg/dL — ABNORMAL HIGH (ref 70–99)
HCT: 36 % (ref 36.0–46.0)
Hemoglobin: 12.2 g/dL (ref 12.0–15.0)
Potassium: 4.1 mmol/L (ref 3.5–5.1)
Sodium: 140 mmol/L (ref 135–145)
TCO2: 29 mmol/L (ref 22–32)

## 2019-09-26 LAB — PROTIME-INR
INR: 1.1 (ref 0.8–1.2)
Prothrombin Time: 13.4 seconds (ref 11.4–15.2)

## 2019-09-26 LAB — DIFFERENTIAL
Abs Immature Granulocytes: 0.02 10*3/uL (ref 0.00–0.07)
Basophils Absolute: 0.1 10*3/uL (ref 0.0–0.1)
Basophils Relative: 1 %
Eosinophils Absolute: 0.1 10*3/uL (ref 0.0–0.5)
Eosinophils Relative: 2 %
Immature Granulocytes: 0 %
Lymphocytes Relative: 27 %
Lymphs Abs: 2.4 10*3/uL (ref 0.7–4.0)
Monocytes Absolute: 0.7 10*3/uL (ref 0.1–1.0)
Monocytes Relative: 8 %
Neutro Abs: 5.4 10*3/uL (ref 1.7–7.7)
Neutrophils Relative %: 62 %

## 2019-09-26 LAB — APTT: aPTT: 36 seconds (ref 24–36)

## 2019-09-26 MED ORDER — SODIUM CHLORIDE 0.9% FLUSH
3.0000 mL | Freq: Once | INTRAVENOUS | Status: DC
Start: 2019-09-26 — End: 2019-09-27

## 2019-09-26 NOTE — ED Triage Notes (Signed)
Pt arrives to ED w/ c/o dizziness that started this morning. Pt at neuro baseline per family, pt has hx of dementia. Pt on baseline 2 lpm O2.

## 2019-09-27 ENCOUNTER — Emergency Department (HOSPITAL_COMMUNITY): Payer: Medicare Other

## 2019-09-27 DIAGNOSIS — R531 Weakness: Secondary | ICD-10-CM | POA: Diagnosis not present

## 2019-09-27 DIAGNOSIS — R42 Dizziness and giddiness: Secondary | ICD-10-CM | POA: Diagnosis not present

## 2019-09-27 LAB — URINALYSIS, ROUTINE W REFLEX MICROSCOPIC
Bacteria, UA: NONE SEEN
Bilirubin Urine: NEGATIVE
Glucose, UA: NEGATIVE mg/dL
Hgb urine dipstick: NEGATIVE
Ketones, ur: NEGATIVE mg/dL
Nitrite: NEGATIVE
Protein, ur: 30 mg/dL — AB
Specific Gravity, Urine: 1.015 (ref 1.005–1.030)
pH: 5 (ref 5.0–8.0)

## 2019-09-27 LAB — CBG MONITORING, ED: Glucose-Capillary: 93 mg/dL (ref 70–99)

## 2019-09-27 LAB — DIGOXIN LEVEL: Digoxin Level: 0.6 ng/mL — ABNORMAL LOW (ref 0.8–2.0)

## 2019-09-27 NOTE — ED Provider Notes (Signed)
[facility] EMERGENCY DEPARTMENT Provider Note   CSN: [id number] Arrival date & time: 09/26/19  1654     History Chief Complaint  Patient presents with  . Dizziness    [name] is a 86 y.o. female.  Patient presents to the emergency department for evaluation of dizziness that she first noticed earlier today.  Patient cannot describe the dizziness.  She has not had any headache, chest pain, or palpitations.  She is breathing comfortably although she was diagnosed with pneumonia on May 18 when she was visiting in [place] and spent 4 days in the hospital.  At time of my evaluation, she reports that she is no longer feeling any dizziness.        Past Medical History:  Diagnosis Date  . Acute bronchitis   . Acute on chronic respiratory failure with hypoxia (HCC)   . Alzheimer disease (HCC) 06/04/2018  . Benign hypertensive renal disease   . CAD (coronary artery disease)   . Cardiomyopathy    non-ischemic 04/2008 EF 45% cardiac MRI no scar  . CHF (congestive heart failure) (HCC)   . Chronic sinusitis   . COPD (chronic obstructive pulmonary disease) (HCC)   . COPD exacerbation (HCC)   . Depression   . Diabetes mellitus without complication, without long-term current use of insulin (HCC)   . Dyslipidemia associated with type 2 diabetes mellitus (HCC)   . Dyspnea on exertion    chronic  . Heart disease   . Hypercholesterolemia   . Hypertension   . On home oxygen therapy    "3L; 24/7; for the last 2 1/2 weeks; none before that" (07/26/2016)  . Protein-calorie malnutrition, severe (HCC)   . Sinus headache    "chronic"  . Stroke [facility]) 1997   "mild"  . Type II diabetes mellitus [facility])     Patient Active Problem List   Diagnosis Date Noted  . Lobar pneumonia, unspecified organism (HCC) 09/25/2019  . Palliative care by specialist   . Delirium   . DNR (do not resuscitate)   . Muscular weakness   . Spiritual or religious counseling   . Atrial  fibrillation with RVR (HCC) 05/09/2019  . 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 Transaminitis 05/09/2019  . Alzheimer disease (HCC) 06/04/2018  . COPD with acute exacerbation (HCC) 10/05/2017  . COPD mixed type (HCC) 08/15/2016  . Weakness 08/15/2016  . Protein-calorie malnutrition, severe 07/28/2016  . Pressure injury of skin 07/27/2016  . Chronic obstructive pulmonary disease (HCC) 07/26/2016  . Chronic respiratory failure with hypoxia (HCC) 07/26/2016  . Essential hypertension 07/26/2016  . Dyslipidemia associated with type 2 diabetes mellitus (HCC) 07/26/2016  . Diabetes mellitus without complication, without long-term current use of insulin (HCC) 07/26/2016  . Acute bronchitis 07/26/2016  . Underweight 05/27/2015  . Chronic diastolic heart failure (HCC) 11/24/2011    Past Surgical History:  Procedure Laterality Date  . carotid angiogram     2006  . ESOPHAGOGASTRODUODENOSCOPY     with foreign body removal  . NASAL SINUS SURGERY  ~1974;~ 1976  . TONSILLECTOMY     "when I was a chld"     OB History   No obstetric history on file.     Family History  Problem Relation Age of Onset  . Heart disease Father   . Alcohol abuse Sister   . Alcohol abuse Brother   . Coronary artery disease Other        family history  Social History   Tobacco Use  . Smoking status: Former Smoker    Packs/day: 0.75    Years: 70.00    Pack years: 52.50    Types: Cigarettes    Start date: 12/13/1948    Quit date: 07/26/2016    Years since quitting: 3.1  . Smokeless tobacco: Never Used  Substance Use Topics  . Alcohol use: No    Alcohol/week: 0.0 standard drinks  . Drug use: No    Home Medications Prior to Admission medications   Medication Sig Start Date End Date Taking? Authorizing Provider  albuterol (PROVENTIL) (2.5 MG/3ML) 0.083% nebulizer solution Take 3 mLs (2.5 mg total) by nebulization every 4 (four) hours as needed for wheezing or shortness of  breath. 09/09/19  Yes Icard, Bradley L, DO  albuterol (VENTOLIN HFA) 108 (90 Base) MCG/ACT inhaler Inhale 2 puffs into the lungs every 6 (six) hours as needed for wheezing or shortness of breath. 09/25/19 10/25/19 Yes Coral Ceo, NP  aspirin EC 81 MG tablet Take 81 mg by mouth daily.   Yes [provider]  atorvastatin (LIPITOR) 10 MG tablet TAKE 1 TABLET(10 MG) BY MOUTH DAILY AT 6 PM Patient taking differently: Take 10 mg by mouth daily at 6 PM.  01/25/19  Yes Wendall Stade, MD  benazepril (LOTENSIN) 10 MG tablet Take 10 mg by mouth 2 (two) times daily.    Yes [provider]  budesonide-formoterol (SYMBICORT) 160-4.5 MCG/ACT inhaler INHALE 2 PUFFS INTO THE LUNGS TWICE DAILY Patient taking differently: Inhale 2 puffs into the lungs 2 (two) times daily. INHALE 2 PUFFS INTO THE LUNGS TWICE DAILY 09/06/18  Yes Icard, Bradley L, DO  carvedilol (COREG) 12.5 MG tablet Take 1 tablet (12.5 mg total) by mouth 2 (two) times daily with a meal. 05/15/19  Yes Sharl Ma, Sarina Ill, MD  digoxin (LANOXIN) 0.125 MG tablet TAKE 1/2 TABLET BY MOUTH EVERY DAY. PLEASE KEEP APPT IN SEPT FOR FUTURE REFILLS Patient taking differently: Take 0.0625 mg by mouth daily.  01/23/19  Yes Wendall Stade, MD  feeding supplement (BOOST HIGH PROTEIN) LIQD Take 1 Container by mouth as needed (between meal).   Yes [provider]  ferrous sulfate 325 (65 FE) MG tablet Take 325 mg by mouth daily with breakfast.   Yes [provider]  fexofenadine (ALLEGRA) 180 MG tablet Take 180 mg by mouth daily as needed for allergies or rhinitis.   Yes [provider]  fluticasone (FLONASE) 50 MCG/ACT nasal spray Place 1 spray into both nostrils daily as needed for allergies. 06/09/16  Yes [provider]  furosemide (LASIX) 20 MG tablet TAKE 1 TABLET BY MOUTH ONCE DAILY Patient taking differently: Take 20 mg by mouth daily.  01/25/19  Yes Wendall Stade, MD  glimepiride (AMARYL) 2 MG tablet Take 2 mg by  mouth daily as needed (for blood sugar over 200.).   Yes [provider]  glucose 4 GM chewable tablet Chew 1 tablet by mouth as needed for low blood sugar.   Yes [provider]  guaifenesin (HUMIBID E) 400 MG TABS tablet Take 400 mg by mouth every 4 (four) hours.   Yes [provider]  Lactobacillus (PROBIOTIC ACIDOPHILUS PO) Take 1 capsule by mouth daily.   Yes [provider]  memantine (NAMENDA) 10 MG tablet TAKE 1 TABLET(10 MG) BY MOUTH TWICE DAILY Patient taking differently: Take 10 mg by mouth 2 (two) times daily.  04/24/19  Yes York Spaniel, MD  metFORMIN (  GLUCOPHAGE-XR) 500 MG 24 hr tablet Take 1,000 mg by mouth 2 (two) times daily.  03/13/15  Yes [provider]  Multiple Vitamin (MV-ONE PO) Take 1 tablet by mouth daily.   Yes [provider]  potassium chloride (KLOR-CON) 10 MEQ tablet Take 10 mEq by mouth daily.   Yes [provider]  Tiotropium Bromide Monohydrate (SPIRIVA RESPIMAT) 2.5 MCG/ACT AERS Inhale 2 puffs into the lungs daily. 05/22/19  Yes Icard, Rachel Bo, DO  vitamin B-12 (CYANOCOBALAMIN) 500 MCG tablet Take 1,000 mcg by mouth daily.    Yes [provider]  guaiFENesin (MUCINEX) 600 MG 12 hr tablet Take 1 tablet (600 mg total) by mouth 2 (two) times daily. Patient not taking: Reported on 09/27/2019 05/13/19   Alba Cory, MD  Spacer/Aero-Holding Chambers (AEROCHAMBER MV) inhaler Use as instructed Patient not taking: Reported on 09/25/2019 02/21/17   Michele Mcalpine, MD  tamsulosin (FLOMAX) 0.4 MG CAPS capsule Take 1 capsule (0.4 mg total) by mouth daily. Patient not taking: Reported on 09/25/2019 05/13/19   Alba Cory, MD    Allergies    Patient has no known allergies.  Review of Systems   Review of Systems  Neurological: Positive for dizziness.  All other systems reviewed and are negative.   Physical Exam Updated Vital Signs BP (!) 154/71   Pulse 99   Temp 98.5 F (36.9 C)  (Oral)   Resp 16   SpO2 100%   Physical Exam Vitals and nursing note reviewed.  Constitutional:      General: She is not in acute distress.    Appearance: Normal appearance. She is well-developed.  HENT:     Head: Normocephalic and atraumatic.     Right Ear: Hearing normal.     Left Ear: Hearing normal.     Nose: Nose normal.  Eyes:     Conjunctiva/sclera: Conjunctivae normal.     Pupils: Pupils are equal, round, and reactive to light.  Cardiovascular:     Rate and Rhythm: Regular rhythm.     Heart sounds: S1 normal and S2 normal. No murmur. No friction rub. No gallop.   Pulmonary:     Effort: Pulmonary effort is normal. No respiratory distress.     Breath sounds: Normal breath sounds.  Chest:     Chest wall: No tenderness.  Abdominal:     General: Bowel sounds are normal.     Palpations: Abdomen is soft.     Tenderness: There is no abdominal tenderness. There is no guarding or rebound. Negative signs include Murphy's sign and McBurney's sign.     Hernia: No hernia is present.  Musculoskeletal:        General: Normal range of motion.     Cervical back: Normal range of motion and neck supple.  Skin:    General: Skin is warm and dry.     Findings: No rash.  Neurological:     Mental Status: She is alert and oriented to person, place, and time.     GCS: GCS eye subscore is 4. GCS verbal subscore is 5. GCS motor subscore is 6.     Cranial Nerves: No cranial nerve deficit.     Sensory: No sensory deficit.     Coordination: Coordination normal.  Psychiatric:        Speech: Speech normal.        Behavior: Behavior normal.        Thought Content: Thought content normal.     ED Results /  Procedures / Treatments   Labs (all labs ordered are listed, but only abnormal results are displayed) Labs Reviewed  CBC - Abnormal; Notable for the following components:      Result Value   RBC 3.61 (*)    Hemoglobin 11.1 (*)    All other components within normal limits  COMPREHENSIVE  METABOLIC PANEL - Abnormal; Notable for the following components:   Glucose, Bld 107 (*)    GFR calc non Af Amer 53 (*)    Anion gap 16 (*)    All other components within normal limits  DIGOXIN LEVEL - Abnormal; Notable for the following components:   Digoxin Level 0.6 (*)    All other components within normal limits  URINALYSIS, ROUTINE W REFLEX MICROSCOPIC - Abnormal; Notable for the following components:   APPearance HAZY (*)    Protein, ur 30 (*)    Leukocytes,Ua LARGE (*)    All other components within normal limits  I-STAT CHEM 8, ED - Abnormal; Notable for the following components:   Glucose, Bld 102 (*)    Calcium, Ion 1.11 (*)    All other components within normal limits  PROTIME-INR  APTT  DIFFERENTIAL  CBG MONITORING, ED    EKG EKG Interpretation  Date/Time:  Thursday September 26 2019 16:57:21 EDT Ventricular Rate:  94 PR Interval:  150 QRS Duration: 146 QT Interval:  410 QTC Calculation: 512 R Axis:   -76 Text Interpretation: Sinus rhythm with Premature supraventricular complexes Right bundle branch block Left anterior fascicular block ** Bifascicular block ** Anterior infarct , age undetermined Abnormal ECG Confirmed by Gilda Crease [phone]) on 09/27/2019 1:54:24 AM   Radiology CT HEAD WO CONTRAST  Result Date: 09/26/2019 CLINICAL DATA:  Dizziness EXAM: CT HEAD WITHOUT CONTRAST TECHNIQUE: Contiguous axial images were obtained from the base of the skull through the vertex without intravenous contrast. COMPARISON:  MRI 01/23/2017 FINDINGS: Brain: There is atrophy and chronic small vessel disease changes. No acute intracranial abnormality. Specifically, no hemorrhage, hydrocephalus, mass lesion, acute infarction, or significant intracranial injury. Vascular: No hyperdense vessel or unexpected calcification. Skull: No acute calvarial abnormality. Sinuses/Orbits: Extensive mucosal thickening throughout the paranasal sinuses. Postoperative changes in the paranasal  sinuses. Other: None IMPRESSION: Atrophy, chronic microvascular disease. No acute intracranial abnormality. Chronic sinusitis. Electronically Signed   By: Charlett Nose M.D.   On: 09/26/2019 18:40   DG Chest Port 1 View  Result Date: 09/27/2019 CLINICAL DATA:  Dizziness and weakness EXAM: PORTABLE CHEST 1 VIEW COMPARISON:  05/09/2019 FINDINGS: Cardiac shadow remains enlarged. Aortic calcifications are seen. Focal eventration of the right hemidiaphragm is noted. No focal confluent infiltrate is seen. No bony abnormality is noted. IMPRESSION: No active disease. Electronically Signed   By: Alcide Clever M.D.   On: 09/27/2019 02:29    Procedures Procedures (including critical care time)  Medications Ordered in ED Medications  sodium chloride flush (NS) 0.9 % injection 3 mL (has no administration in time range)    ED Course  I have reviewed the triage vital signs and the nursing notes.  Pertinent labs & imaging results that were available during my care of the patient were reviewed by me and considered in my medical decision making (see chart for details).    MDM Rules/Calculators/A&P                      Patient presented to the ER for evaluation of dizziness.  Etiology is unclear at this time, as dizziness has  resolved.  She has no focal findings on exam.  She is without complaints currently.  Work-up is reassuring.  This includes blood work, urinalysis and CT head.  Patient was recently hospitalized for pneumonia.  A chest x-ray today shows resolution, no active disease.  Based on resolution of symptoms and reassuring work-up, patient will be discharged home.  Final Clinical Impression(s) / ED Diagnoses Final diagnoses:  Dizziness    Rx / DC Orders ED Discharge Orders    None       Aristeo Hankerson, Gwenyth Allegra, MD 09/27/19 [phone]

## 2019-09-27 NOTE — ED Notes (Signed)
Pt was able to ambulate to the bathroom without difficulty

## 2019-09-29 NOTE — Progress Notes (Signed)
Cardiology Office Note   Date:  10/01/2019   ID:  [name], DOB [birth year], MRN [id number]  PCP:  [name], MD  Cardiologist: Dr. [name], MD  Chief Complaint  Patient presents with   Dizziness    History of Present Illness: [name] is a 86 y.o. female who presents for follow up, seen for Dr. [name].    [name] has a history of advanced COPD with previous tobacco use followed by Dr. Tonia Brooms with pulmonary medicine, PAF on carvedilol and digoxin not on anticoagulation secondary to age, fall risk and reduced body weight.   She was hospitalized 05/09/2019-05/15/2019 for acute respiratory failure and CHF exacerbation noted to be in rapid atrial fibrillation. Echocardiogram performed at that time showed newly reduced LV function to 30 to 35% with moderate LAE, mild RAE and severe functional MR. She improved with diuresis and treatment for her COPD.  She has progressive dementia therefore palliative care was involved. Not felt to be a candidate for MitraClip secondary to comorbid conditions. Followed with pulmonary medicine and confirmed DNR status due to dementia, stage IV COPD, advanced age and cardiac issues not amenable to treatment.  She was last seen by Dr. [name] 06/05/2019 found to be back in NSR on exam. CHF symptoms felt to be secondary to low EF and severe MR not felt to be amenable to treatment with MitraClip therefore she was continued on ACEI and Lasix therapies.  Repeat echocardiogram 06/20/2019 with essentially no change from prior study 04/2019 with reduced LV function and severe MR.  Per chart review, patient was seen at [facility] ED 09/27/2019 with complaints of dizziness of unclear etiology.  Dizziness had resolved by ED evaluation. She had no focal findings on exam. Work-up was reassuring including lab work, UA and head CT. Patient reported recently being hospitalized in [place] for the treatment of pneumonia however CXR during ED evaluation with resolution and no  active disease.  Given this, patient was discharged home.  Today, she is here with her daughter.  Daughter provides much of the HPI given patient's history of progressive dementia.  As above, patient was recently hospitalized for pneumonia while in [place].  Per daughter's report, she was discharged on no new medications.  After arrival back to her home, approximately 1 week later she began having complaints of mild dizziness.  She was seen by her PCP (with daughter) last week at which time her benazepril was decreased to 10 mg once daily.  Given her ongoing symptoms, she was seen in the ED as above with essentially normal work-up.  Difficult situation given her advanced age and significant comorbid condition with COPD along with progressive dementia.  Per PCP (daughters report) there may be some component of dementia playing a role in her symptom complaint.  Dizziness mostly occurs with ambulation, minimally with sitting/resting therefore suspicion for some orthostatic dizziness.  Also concerned given her PAF history for more persistent AF?  Daughter reports that she has very poor p.o. intake with food and fluids.  ED lab work does not suggest dehydration however this also could play a role.  As above, patient does not wish for aggressive interventions.  She has no complaints of chest pain, shortness of breath, palpitations, LE edema, orthopnea, or syncope.    Past Medical History:  Diagnosis Date   Acute bronchitis    Acute on chronic respiratory failure with hypoxia (HCC)    Alzheimer disease (HCC) 06/04/2018   Benign hypertensive renal disease  CAD (coronary artery disease)    Cardiomyopathy    non-ischemic 04/2008 EF 45% cardiac MRI no scar   CHF (congestive heart failure) (HCC)    Chronic sinusitis    COPD (chronic obstructive pulmonary disease) (HCC)    COPD exacerbation (HCC)    Depression    Diabetes mellitus without complication, without long-term current use of insulin  (HCC)    Dyslipidemia associated with type 2 diabetes mellitus (HCC)    Dyspnea on exertion    chronic   Heart disease    Hypercholesterolemia    Hypertension    On home oxygen therapy    "3L; 24/7; for the last 2 1/2 weeks; none before that" (07/26/2016)   Protein-calorie malnutrition, severe (HCC)    Sinus headache    "chronic"   Stroke [facility]) 1997   "mild"   Type II diabetes mellitus (HCC)     Past Surgical History:  Procedure Laterality Date   carotid angiogram     2006   ESOPHAGOGASTRODUODENOSCOPY     with foreign body removal   NASAL SINUS SURGERY  ~1974;~ 1976   TONSILLECTOMY     "when I was a chld"     Current Outpatient Medications  Medication Sig Dispense Refill   albuterol (PROVENTIL) (2.5 MG/3ML) 0.083% nebulizer solution Take 3 mLs (2.5 mg total) by nebulization every 4 (four) hours as needed for wheezing or shortness of breath. 120 mL 1   albuterol (VENTOLIN HFA) 108 (90 Base) MCG/ACT inhaler Inhale 2 puffs into the lungs every 6 (six) hours as needed for wheezing or shortness of breath. 8 g 5   aspirin EC 81 MG tablet Take 81 mg by mouth daily.     atorvastatin (LIPITOR) 10 MG tablet TAKE 1 TABLET(10 MG) BY MOUTH DAILY AT 6 PM 90 tablet 3   benazepril (LOTENSIN) 10 MG tablet Take 10 mg by mouth daily.      budesonide-formoterol (SYMBICORT) 160-4.5 MCG/ACT inhaler INHALE 2 PUFFS INTO THE LUNGS TWICE DAILY 3 Inhaler 2   carvedilol (COREG) 6.25 MG tablet Take 1 tablet (6.25 mg total) by mouth 2 (two) times daily with a meal. 180 tablet 3   digoxin (LANOXIN) 0.125 MG tablet TAKE 1/2 TABLET BY MOUTH EVERY DAY. PLEASE KEEP APPT IN SEPT FOR FUTURE REFILLS 45 tablet 3   feeding supplement (BOOST HIGH PROTEIN) LIQD Take 1 Container by mouth as needed (between meal).     ferrous sulfate 325 (65 FE) MG tablet Take 325 mg by mouth daily with breakfast.     fexofenadine (ALLEGRA) 180 MG tablet Take 180 mg by mouth daily as needed for allergies or  rhinitis.     fluticasone (FLONASE) 50 MCG/ACT nasal spray Place 1 spray into both nostrils daily as needed for allergies.  3   furosemide (LASIX) 20 MG tablet TAKE 1 TABLET BY MOUTH ONCE DAILY 90 tablet 3   glimepiride (AMARYL) 2 MG tablet Take 2 mg by mouth daily as needed (for blood sugar over 200.).     glucose 4 GM chewable tablet Chew 1 tablet by mouth as needed for low blood sugar.     guaifenesin (HUMIBID E) 400 MG TABS tablet Take 400 mg by mouth every 4 (four) hours.     guaiFENesin (MUCINEX) 600 MG 12 hr tablet Take 1 tablet (600 mg total) by mouth 2 (two) times daily. 30 tablet 0   Lactobacillus (PROBIOTIC ACIDOPHILUS PO) Take 1 capsule by mouth daily.     memantine (NAMENDA) 10 MG tablet  TAKE 1 TABLET(10 MG) BY MOUTH TWICE DAILY 180 tablet 1   metFORMIN (GLUCOPHAGE-XR) 500 MG 24 hr tablet Take 1,000 mg by mouth 2 (two) times daily.   5   Multiple Vitamin (MV-ONE PO) Take 1 tablet by mouth daily.     ondansetron (ZOFRAN-ODT) 4 MG disintegrating tablet Take 1 tablet by mouth every 8 (eight) hours as needed for nausea/vomiting.     potassium chloride (KLOR-CON) 10 MEQ tablet Take 10 mEq by mouth daily.     Spacer/Aero-Holding Chambers (AEROCHAMBER MV) inhaler Use as instructed 1 each 0   tamsulosin (FLOMAX) 0.4 MG CAPS capsule Take 1 capsule (0.4 mg total) by mouth daily. 15 capsule 0   Tiotropium Bromide Monohydrate (SPIRIVA RESPIMAT) 2.5 MCG/ACT AERS Inhale 2 puffs into the lungs daily. 4 g 5   vitamin B-12 (CYANOCOBALAMIN) 500 MCG tablet Take 1,000 mcg by mouth daily.      No current facility-administered medications for this visit.    Allergies:   Patient has no known allergies.    Social History:  The patient  reports that she quit smoking about 3 years ago. Her smoking use included cigarettes. She started smoking about 70 years ago. She has a 52.50 pack-year smoking history. She has never used smokeless tobacco. She reports that she does not drink alcohol or  use drugs.   Family History:  The patient's family history includes Alcohol abuse in her brother and sister; Coronary artery disease in an other family member; Heart disease in her father.    ROS:  Please see the history of present illness.   Otherwise, review of systems are positive for none. All other systems are reviewed and negative.   PHYSICAL EXAM: VS:  BP (!) 132/48    Pulse 68    Ht 5\' 3"  (1.6 m)    Wt 81 lb (36.7 kg)    BMI 14.35 kg/m  , BMI Body mass index is 14.35 kg/m.   General: Frail, NAD Skin: Warm, dry, intact  Neck: Negative for carotid bruits. No JVD Lungs:Clear to ausculation bilaterally. No wheezes, rales, or rhonchi. Breathing is unlabored. Cardiovascular: RRR with S1 S2. + MR murmur Extremities: No edema. Radial pulses 2+ bilaterally Neuro: Alert and oriented. No focal deficits. No facial asymmetry. MAE spontaneously. Psych: Responds to questions appropriately with normal affect.     EKG:  EKG is ordered today.   Recent Labs: 05/09/2019: B Natriuretic Peptide 1,267.3; TSH 1.429 05/10/2019: Magnesium 2.2 09/26/2019: ALT 19; BUN 20; Creatinine, Ser 1.00; Hemoglobin 12.2; Platelets 237; Potassium 4.1; Sodium 140    Lipid Panel    Component Value Date/Time   CHOL 129 05/24/2016 0924   TRIG 76 05/24/2016 0924   HDL 65 05/24/2016 0924   CHOLHDL 2.0 05/24/2016 0924   CHOLHDL 3.2 05/01/2010 0600   VLDL 45 (H) 05/01/2010 0600   LDLCALC 49 05/24/2016 0924      Wt Readings from Last 3 Encounters:  10/01/19 81 lb (36.7 kg)  09/25/19 86 lb (39 kg)  08/07/19 85 lb (38.6 kg)      Other studies Reviewed: Additional studies/ records that were reviewed today include: . Review of the above records demonstrates:   Echocardiogram 06/20/2019:  1. Left ventricular ejection fraction, by estimation, is 30 to 35%. The  left ventricle has moderately decreased function. The left ventricle  demonstrates global hypokinesis. Indeterminate diastolic filling due to  E-A  fusion.  2. Right ventricular systolic function is normal. The right ventricular  size is mildly enlarged.  There is normal pulmonary artery systolic  pressure. The estimated right ventricular systolic pressure is 20.1 mmHg.  3. Left atrial size was severely dilated.  4. Right atrial size was severely dilated.  5. Restricted PMVL in systole (IIIB). The mitral valve is grossly normal.  Severe mitral valve regurgitation.  6. The aortic valve is tricuspid. Aortic valve regurgitation is not  visualized. No aortic stenosis is present.  7. There is mild (Grade II) layered plaque involving the aortic root.  8. The inferior vena cava is normal in size with greater than 50%  respiratory variability, suggesting right atrial pressure of 3 mmHg.   Comparison(s): A prior study was performed on 05/09/2019. No significant  change from prior study. EF remains 30-35%. Suspect MR is still severe.  IVC collapsable on this study, indicating volume status improved.    Echocardiogram 05/09/2019:   1. Left ventricular ejection fraction, by estimation, is 30 to 35%. The  left ventricle has moderately decreased function. The left ventricle  demonstrates global hypokinesis. Indeterminate diastolic filling due to  E-A fusion.  2. Right ventricular systolic function is normal. The right ventricular  size is mildly enlarged. There is normal pulmonary artery systolic  pressure. The estimated right ventricular systolic pressure is 20.1 mmHg.  3. Left atrial size was severely dilated.  4. Right atrial size was severely dilated.  5. Restricted PMVL in systole (IIIB). The mitral valve is grossly normal.  Severe mitral valve regurgitation.  6. The aortic valve is tricuspid. Aortic valve regurgitation is not  visualized. No aortic stenosis is present.  7. There is mild (Grade II) layered plaque involving the aortic root.  8. The inferior vena cava is normal in size with greater than 50%  respiratory  variability, suggesting right atrial pressure of 3 mmHg.   Comparison(s): A prior study was performed on 05/09/2019. No significant  change from prior study. EF remains 30-35%. Suspect MR is still severe.  IVC collapsable on this study, indicating volume status improved.   ASSESSMENT AND PLAN:  1.  Dizziness: -Here with daughter who provides most of her HPI.  Reports since hospital discharge in [place] for the treatment of pneumonia patient has had intermittent episodes of dizziness> mostly with ambulation, rarely with sitting.  Was seen by her PCP last week at which time her benazepril was decreased to 10 mg p.o. daily.  Per their conversation, dementia could be playing some role in her symptoms.  Plan to decrease carvedilol to 6.25 mg twice daily as she could have some orthostatic component as well.  Does have a history of PAF with no complaints of palpitations and appears to be in normal sinus rhythm on exam today.  We will place 1 week ZIO monitor to rule out more persistent atrial fibrillation. -ED work-up 09/26/2019 without specific etiology>>full lab work WNL, head CT negative, no evidence of UA  2. PAF: -Continue digoxin, carvedilol however will reduce to 6.25mg  PO BID to allow for greater BP -No anticoagulation secondary to advanced age, high fall risk -Place ZIO to evaluate for dizziness>>more persistent AF?   2.  CHF: -Felt secondary to low systolic function and severe MR>> not felt to be a candidate for MitraClip due to comorbid conditions -Continue ACE, Lasix therapies -Last echocardiogram with 06/20/2019 with essentially no change from prior study 04/2019 with reduced LV function and severe MR.  3.  Advanced COPD: -Followed by pulmonary medicine -Continue inhalers -On home supplemental O2  4.  Dementia: -Progressive -Continue Namenda  5.  HLD: -Continue statin   Current medicines are reviewed at length with the patient today.  The patient does not have concerns regarding  medicines.  The following changes have been made:  Decrease coreg to 6.25mg  PO BID   Labs/ tests ordered today include: None>>full lab work 6/4 No orders of the defined types were placed in this encounter.   Disposition:   FU with myself in 6 weeks  Signed, Kathyrn Drown, NP  10/01/2019 12:58 PM    [address], [place], [place]  [address] Phone: [phone]; Fax: [phone]

## 2019-09-30 ENCOUNTER — Other Ambulatory Visit: Payer: Self-pay | Admitting: *Deleted

## 2019-09-30 DIAGNOSIS — R300 Dysuria: Secondary | ICD-10-CM | POA: Diagnosis not present

## 2019-09-30 DIAGNOSIS — J3489 Other specified disorders of nose and nasal sinuses: Secondary | ICD-10-CM | POA: Diagnosis not present

## 2019-09-30 DIAGNOSIS — Z13220 Encounter for screening for lipoid disorders: Secondary | ICD-10-CM | POA: Diagnosis not present

## 2019-09-30 DIAGNOSIS — I10 Essential (primary) hypertension: Secondary | ICD-10-CM | POA: Diagnosis not present

## 2019-09-30 DIAGNOSIS — R42 Dizziness and giddiness: Secondary | ICD-10-CM | POA: Diagnosis not present

## 2019-09-30 MED ORDER — ALBUTEROL SULFATE (2.5 MG/3ML) 0.083% IN NEBU: 2.5000 mg | INHALATION_SOLUTION | RESPIRATORY_TRACT | 1 refills | Status: AC | PRN

## 2019-10-01 ENCOUNTER — Ambulatory Visit (INDEPENDENT_AMBULATORY_CARE_PROVIDER_SITE_OTHER): Payer: Medicare Other | Admitting: Cardiology

## 2019-10-01 ENCOUNTER — Encounter: Payer: Self-pay | Admitting: Cardiology

## 2019-10-01 ENCOUNTER — Other Ambulatory Visit: Payer: Self-pay

## 2019-10-01 VITALS — BP 132/48 | HR 68 | Ht 63.0 in | Wt 81.0 lb

## 2019-10-01 DIAGNOSIS — I340 Nonrheumatic mitral (valve) insufficiency: Secondary | ICD-10-CM

## 2019-10-01 DIAGNOSIS — R42 Dizziness and giddiness: Secondary | ICD-10-CM

## 2019-10-01 DIAGNOSIS — I10 Essential (primary) hypertension: Secondary | ICD-10-CM | POA: Diagnosis not present

## 2019-10-01 DIAGNOSIS — I428 Other cardiomyopathies: Secondary | ICD-10-CM

## 2019-10-01 DIAGNOSIS — I5032 Chronic diastolic (congestive) heart failure: Secondary | ICD-10-CM

## 2019-10-01 MED ORDER — CARVEDILOL 6.25 MG PO TABS: 6.2500 mg | ORAL_TABLET | Freq: Two times a day (BID) | ORAL | 3 refills | Status: AC

## 2019-10-01 NOTE — Progress Notes (Signed)
Agree. Thanks Josephine Igo, DO [facility] 10/01/2019 5:05 PM

## 2019-10-01 NOTE — Patient Instructions (Signed)
Medication Instructions:   Your physician has recommended you make the following change in your medication:   1) Decrease Carvedilol to 6.25 mg, 1 tablet by mouth twice a day  *If you need a refill on your cardiac medications before your next appointment, please call your pharmacy*  Lab Work:  None ordered today  Testing/Procedures:  A zio monitor was ordered today. It will remain on for 7 days. You will then return monitor and event diary in provided box. It takes 1-2 weeks for report to be downloaded and returned to [place]. We will call you with the results. If monitor falls off or has orange flashing light, please call Zio for further instructions.   Follow-Up:  On 11/13/19 at 11:15AM with Georgie Chard, NP

## 2019-10-03 ENCOUNTER — Other Ambulatory Visit: Payer: Self-pay

## 2019-10-03 ENCOUNTER — Other Ambulatory Visit: Payer: Medicare Other

## 2019-10-03 VITALS — BP 140/80 | HR 78 | Resp 18 | Wt 81.0 lb

## 2019-10-03 DIAGNOSIS — Z515 Encounter for palliative care: Secondary | ICD-10-CM

## 2019-10-03 NOTE — Progress Notes (Signed)
PATIENT NAME: [name] DOB: [birth year] MRN: [id number]  PRIMARY CARE PROVIDER: [name], MD  RESPONSIBLE PARTY:  Acct ID - Guarantor Home Phone Work Phone Relationship Acct Type  [id number] - Sparkman,BILL* [phone]  Self P/F     [address], [place], [place] [address]    PLAN OF CARE and INTERVENTIONS:               1.  GOALS OF CARE/ ADVANCE CARE PLANNING: Remain at home with daughter, [name]. Remain comfortable.               2.  PATIENT/CAREGIVER EDUCATION:  Education provided regarding Dementia progression vs infectious process. Reinforced education regarding fall precautions.                               3.  DISEASE STATUS: RN visit made today. Patient in bed, awakens easily and verbally responsive to questions. Calm and cooperative with assessment. Patient had recent hospitalization in [place] when visiting grand daughter due to increased shortness of breath. Patient went to ED on 09/26/2019 for complaints of dizziness. Daughter reported that patient will often say she does not feel well but does not usually specify what is bothering her. Daughter questions if patient is getting depressed as she will now sigh and say "oh well" a few times through out the day. Noted increased weakness and a decline in patient's ability to participate in her ADLs.  Daughter awaiting to hear from [facility] health agency for PT to be initiated. Reached out to [employer] for follow up. [name] interested in having some assistance in caring for patient as well, as patient's care needs have increased.   Patient recently seen by PCP who initiated abt therapy for sinusitis. No adverse reaction noted.  HISTORY OF PRESENT ILLNESS:  This is an 86 year old female, residing in home with daughter, [name]. Past medical history includes but not limited to Acute on Chronic CHF, A fib, COPD, DM type 2 and dementia.  CODE STATUS: DNR ADVANCED DIRECTIVES: Yes MOST FORM: Yes PPS: 40%   PHYSICAL  EXAM:   VITALS: Today's Vitals   10/03/19 1334 10/03/19 1339  BP: 140/80 140/80  Pulse: 78 78  Resp: 18   SpO2: 100% 100%  Weight: 81 lb (36.7 kg)   PainSc: 0-No pain     LUNGS: Oxygen dependent @ 2l n/c. Denies cough, LS diminished.  CARDIAC: Cor RRR EXTREMITIES: Fingers cool to touch. No edema present.to BLE SKIN: Warm, dry and intact. Daughter denies any areas of concern.  NEURO: Awake, able to make eye contact and verbally responsive to simple questions with only few words today. Patient has dx of dementia with noted cognitive impairment. Daughter reports that she has noted some worsening of memory. Patient will awaken about 8 am and call for daughter stating she has to get up and go somewhere. [name] also noted that patient has had more difficulty utilizing her phone lately. Discussed disease progression vs infection.        Estanislado Pandy, RN,BSN

## 2019-10-04 ENCOUNTER — Other Ambulatory Visit: Payer: Self-pay | Admitting: *Deleted

## 2019-10-07 DIAGNOSIS — G309 Alzheimer's disease, unspecified: Secondary | ICD-10-CM | POA: Diagnosis not present

## 2019-10-07 DIAGNOSIS — H9190 Unspecified hearing loss, unspecified ear: Secondary | ICD-10-CM | POA: Diagnosis not present

## 2019-10-07 DIAGNOSIS — I130 Hypertensive heart and chronic kidney disease with heart failure and stage 1 through stage 4 chronic kidney disease, or unspecified chronic kidney disease: Secondary | ICD-10-CM | POA: Diagnosis not present

## 2019-10-07 DIAGNOSIS — N189 Chronic kidney disease, unspecified: Secondary | ICD-10-CM | POA: Diagnosis not present

## 2019-10-07 DIAGNOSIS — I4819 Other persistent atrial fibrillation: Secondary | ICD-10-CM | POA: Diagnosis not present

## 2019-10-07 DIAGNOSIS — F0150 Vascular dementia without behavioral disturbance: Secondary | ICD-10-CM | POA: Diagnosis not present

## 2019-10-07 DIAGNOSIS — J309 Allergic rhinitis, unspecified: Secondary | ICD-10-CM | POA: Diagnosis not present

## 2019-10-07 DIAGNOSIS — I088 Other rheumatic multiple valve diseases: Secondary | ICD-10-CM | POA: Diagnosis not present

## 2019-10-07 DIAGNOSIS — F0280 Dementia in other diseases classified elsewhere without behavioral disturbance: Secondary | ICD-10-CM | POA: Diagnosis not present

## 2019-10-07 DIAGNOSIS — K649 Unspecified hemorrhoids: Secondary | ICD-10-CM | POA: Diagnosis not present

## 2019-10-07 DIAGNOSIS — Z7951 Long term (current) use of inhaled steroids: Secondary | ICD-10-CM | POA: Diagnosis not present

## 2019-10-07 DIAGNOSIS — I0981 Rheumatic heart failure: Secondary | ICD-10-CM | POA: Diagnosis not present

## 2019-10-07 DIAGNOSIS — D631 Anemia in chronic kidney disease: Secondary | ICD-10-CM | POA: Diagnosis not present

## 2019-10-07 DIAGNOSIS — I5020 Unspecified systolic (congestive) heart failure: Secondary | ICD-10-CM | POA: Diagnosis not present

## 2019-10-07 DIAGNOSIS — F05 Delirium due to known physiological condition: Secondary | ICD-10-CM | POA: Diagnosis not present

## 2019-10-07 DIAGNOSIS — I5032 Chronic diastolic (congestive) heart failure: Secondary | ICD-10-CM | POA: Diagnosis not present

## 2019-10-07 DIAGNOSIS — K449 Diaphragmatic hernia without obstruction or gangrene: Secondary | ICD-10-CM | POA: Diagnosis not present

## 2019-10-07 DIAGNOSIS — E1122 Type 2 diabetes mellitus with diabetic chronic kidney disease: Secondary | ICD-10-CM | POA: Diagnosis not present

## 2019-10-07 DIAGNOSIS — I69318 Other symptoms and signs involving cognitive functions following cerebral infarction: Secondary | ICD-10-CM | POA: Diagnosis not present

## 2019-10-07 DIAGNOSIS — Z7982 Long term (current) use of aspirin: Secondary | ICD-10-CM | POA: Diagnosis not present

## 2019-10-07 DIAGNOSIS — J439 Emphysema, unspecified: Secondary | ICD-10-CM | POA: Diagnosis not present

## 2019-10-07 DIAGNOSIS — E43 Unspecified severe protein-calorie malnutrition: Secondary | ICD-10-CM | POA: Diagnosis not present

## 2019-10-07 DIAGNOSIS — E7800 Pure hypercholesterolemia, unspecified: Secondary | ICD-10-CM | POA: Diagnosis not present

## 2019-10-07 DIAGNOSIS — J329 Chronic sinusitis, unspecified: Secondary | ICD-10-CM | POA: Diagnosis not present

## 2019-10-07 DIAGNOSIS — J9621 Acute and chronic respiratory failure with hypoxia: Secondary | ICD-10-CM | POA: Diagnosis not present

## 2019-10-08 DIAGNOSIS — F0150 Vascular dementia without behavioral disturbance: Secondary | ICD-10-CM | POA: Diagnosis not present

## 2019-10-08 DIAGNOSIS — F05 Delirium due to known physiological condition: Secondary | ICD-10-CM | POA: Diagnosis not present

## 2019-10-08 DIAGNOSIS — F0280 Dementia in other diseases classified elsewhere without behavioral disturbance: Secondary | ICD-10-CM | POA: Diagnosis not present

## 2019-10-08 DIAGNOSIS — J439 Emphysema, unspecified: Secondary | ICD-10-CM | POA: Diagnosis not present

## 2019-10-08 DIAGNOSIS — G309 Alzheimer's disease, unspecified: Secondary | ICD-10-CM | POA: Diagnosis not present

## 2019-10-08 DIAGNOSIS — I69318 Other symptoms and signs involving cognitive functions following cerebral infarction: Secondary | ICD-10-CM | POA: Diagnosis not present

## 2019-10-15 DIAGNOSIS — F0150 Vascular dementia without behavioral disturbance: Secondary | ICD-10-CM | POA: Diagnosis not present

## 2019-10-15 DIAGNOSIS — F0280 Dementia in other diseases classified elsewhere without behavioral disturbance: Secondary | ICD-10-CM | POA: Diagnosis not present

## 2019-10-15 DIAGNOSIS — G309 Alzheimer's disease, unspecified: Secondary | ICD-10-CM | POA: Diagnosis not present

## 2019-10-15 DIAGNOSIS — J439 Emphysema, unspecified: Secondary | ICD-10-CM | POA: Diagnosis not present

## 2019-10-15 DIAGNOSIS — F05 Delirium due to known physiological condition: Secondary | ICD-10-CM | POA: Diagnosis not present

## 2019-10-15 DIAGNOSIS — I69318 Other symptoms and signs involving cognitive functions following cerebral infarction: Secondary | ICD-10-CM | POA: Diagnosis not present

## 2019-10-18 ENCOUNTER — Other Ambulatory Visit: Payer: Self-pay | Admitting: Neurology

## 2019-10-21 ENCOUNTER — Emergency Department (HOSPITAL_COMMUNITY): Payer: Medicare Other

## 2019-10-21 ENCOUNTER — Encounter (HOSPITAL_COMMUNITY): Payer: Self-pay | Admitting: Emergency Medicine

## 2019-10-21 ENCOUNTER — Other Ambulatory Visit: Payer: Self-pay

## 2019-10-21 ENCOUNTER — Inpatient Hospital Stay (HOSPITAL_COMMUNITY)
Admission: EM | Admit: 2019-10-21 | Discharge: 2019-10-24 | DRG: 291 | Disposition: E | Payer: Medicare Other | Attending: Internal Medicine | Admitting: Internal Medicine

## 2019-10-21 DIAGNOSIS — J329 Chronic sinusitis, unspecified: Secondary | ICD-10-CM | POA: Diagnosis present

## 2019-10-21 DIAGNOSIS — I452 Bifascicular block: Secondary | ICD-10-CM | POA: Diagnosis present

## 2019-10-21 DIAGNOSIS — I4891 Unspecified atrial fibrillation: Secondary | ICD-10-CM | POA: Diagnosis present

## 2019-10-21 DIAGNOSIS — E872 Acidosis: Secondary | ICD-10-CM | POA: Diagnosis present

## 2019-10-21 DIAGNOSIS — J439 Emphysema, unspecified: Secondary | ICD-10-CM | POA: Diagnosis not present

## 2019-10-21 DIAGNOSIS — Z87891 Personal history of nicotine dependence: Secondary | ICD-10-CM

## 2019-10-21 DIAGNOSIS — I480 Paroxysmal atrial fibrillation: Secondary | ICD-10-CM | POA: Diagnosis present

## 2019-10-21 DIAGNOSIS — G309 Alzheimer's disease, unspecified: Secondary | ICD-10-CM | POA: Diagnosis not present

## 2019-10-21 DIAGNOSIS — Z20822 Contact with and (suspected) exposure to covid-19: Secondary | ICD-10-CM | POA: Diagnosis present

## 2019-10-21 DIAGNOSIS — J441 Chronic obstructive pulmonary disease with (acute) exacerbation: Secondary | ICD-10-CM

## 2019-10-21 DIAGNOSIS — I469 Cardiac arrest, cause unspecified: Secondary | ICD-10-CM | POA: Diagnosis not present

## 2019-10-21 DIAGNOSIS — Z79899 Other long term (current) drug therapy: Secondary | ICD-10-CM

## 2019-10-21 DIAGNOSIS — F05 Delirium due to known physiological condition: Secondary | ICD-10-CM | POA: Diagnosis not present

## 2019-10-21 DIAGNOSIS — Z7982 Long term (current) use of aspirin: Secondary | ICD-10-CM

## 2019-10-21 DIAGNOSIS — Z66 Do not resuscitate: Secondary | ICD-10-CM | POA: Diagnosis present

## 2019-10-21 DIAGNOSIS — Z7951 Long term (current) use of inhaled steroids: Secondary | ICD-10-CM

## 2019-10-21 DIAGNOSIS — I2510 Atherosclerotic heart disease of native coronary artery without angina pectoris: Secondary | ICD-10-CM

## 2019-10-21 DIAGNOSIS — E1165 Type 2 diabetes mellitus with hyperglycemia: Secondary | ICD-10-CM | POA: Diagnosis present

## 2019-10-21 DIAGNOSIS — I69318 Other symptoms and signs involving cognitive functions following cerebral infarction: Secondary | ICD-10-CM | POA: Diagnosis not present

## 2019-10-21 DIAGNOSIS — Z8249 Family history of ischemic heart disease and other diseases of the circulatory system: Secondary | ICD-10-CM

## 2019-10-21 DIAGNOSIS — R64 Cachexia: Secondary | ICD-10-CM | POA: Diagnosis present

## 2019-10-21 DIAGNOSIS — E7800 Pure hypercholesterolemia, unspecified: Secondary | ICD-10-CM | POA: Diagnosis present

## 2019-10-21 DIAGNOSIS — D696 Thrombocytopenia, unspecified: Secondary | ICD-10-CM | POA: Diagnosis present

## 2019-10-21 DIAGNOSIS — I428 Other cardiomyopathies: Secondary | ICD-10-CM | POA: Diagnosis present

## 2019-10-21 DIAGNOSIS — I248 Other forms of acute ischemic heart disease: Secondary | ICD-10-CM | POA: Diagnosis present

## 2019-10-21 DIAGNOSIS — R54 Age-related physical debility: Secondary | ICD-10-CM | POA: Diagnosis present

## 2019-10-21 DIAGNOSIS — I340 Nonrheumatic mitral (valve) insufficiency: Secondary | ICD-10-CM | POA: Diagnosis present

## 2019-10-21 DIAGNOSIS — Z7984 Long term (current) use of oral hypoglycemic drugs: Secondary | ICD-10-CM

## 2019-10-21 DIAGNOSIS — D539 Nutritional anemia, unspecified: Secondary | ICD-10-CM | POA: Diagnosis present

## 2019-10-21 DIAGNOSIS — J9621 Acute and chronic respiratory failure with hypoxia: Secondary | ICD-10-CM | POA: Diagnosis present

## 2019-10-21 DIAGNOSIS — R778 Other specified abnormalities of plasma proteins: Secondary | ICD-10-CM

## 2019-10-21 DIAGNOSIS — F329 Major depressive disorder, single episode, unspecified: Secondary | ICD-10-CM | POA: Diagnosis present

## 2019-10-21 DIAGNOSIS — Z681 Body mass index (BMI) 19 or less, adult: Secondary | ICD-10-CM

## 2019-10-21 DIAGNOSIS — F0150 Vascular dementia without behavioral disturbance: Secondary | ICD-10-CM | POA: Diagnosis not present

## 2019-10-21 DIAGNOSIS — E785 Hyperlipidemia, unspecified: Secondary | ICD-10-CM | POA: Diagnosis present

## 2019-10-21 DIAGNOSIS — F0280 Dementia in other diseases classified elsewhere without behavioral disturbance: Secondary | ICD-10-CM | POA: Diagnosis present

## 2019-10-21 DIAGNOSIS — E119 Type 2 diabetes mellitus without complications: Secondary | ICD-10-CM

## 2019-10-21 DIAGNOSIS — I509 Heart failure, unspecified: Secondary | ICD-10-CM

## 2019-10-21 DIAGNOSIS — I10 Essential (primary) hypertension: Secondary | ICD-10-CM | POA: Diagnosis present

## 2019-10-21 DIAGNOSIS — I5043 Acute on chronic combined systolic (congestive) and diastolic (congestive) heart failure: Secondary | ICD-10-CM | POA: Diagnosis present

## 2019-10-21 DIAGNOSIS — E1169 Type 2 diabetes mellitus with other specified complication: Secondary | ICD-10-CM | POA: Diagnosis present

## 2019-10-21 DIAGNOSIS — I110 Hypertensive heart disease with heart failure: Principal | ICD-10-CM | POA: Diagnosis present

## 2019-10-21 DIAGNOSIS — Z9981 Dependence on supplemental oxygen: Secondary | ICD-10-CM

## 2019-10-21 DIAGNOSIS — J449 Chronic obstructive pulmonary disease, unspecified: Secondary | ICD-10-CM

## 2019-10-21 DIAGNOSIS — Z8673 Personal history of transient ischemic attack (TIA), and cerebral infarction without residual deficits: Secondary | ICD-10-CM

## 2019-10-21 DIAGNOSIS — I471 Supraventricular tachycardia: Secondary | ICD-10-CM | POA: Diagnosis present

## 2019-10-21 LAB — CBC
HCT: 37.5 % (ref 36.0–46.0)
Hemoglobin: 11.2 g/dL — ABNORMAL LOW (ref 12.0–15.0)
MCH: 30.3 pg (ref 26.0–34.0)
MCHC: 29.9 g/dL — ABNORMAL LOW (ref 30.0–36.0)
MCV: 101.4 fL — ABNORMAL HIGH (ref 80.0–100.0)
Platelets: 131 10*3/uL — ABNORMAL LOW (ref 150–400)
RBC: 3.7 MIL/uL — ABNORMAL LOW (ref 3.87–5.11)
RDW: 14.7 % (ref 11.5–15.5)
WBC: 9.7 10*3/uL (ref 4.0–10.5)
nRBC: 0 % (ref 0.0–0.2)

## 2019-10-21 LAB — BASIC METABOLIC PANEL
Anion gap: 20 — ABNORMAL HIGH (ref 5–15)
BUN: 24 mg/dL — ABNORMAL HIGH (ref 8–23)
CO2: 18 mmol/L — ABNORMAL LOW (ref 22–32)
Calcium: 9.6 mg/dL (ref 8.9–10.3)
Chloride: 101 mmol/L (ref 98–111)
Creatinine, Ser: 0.95 mg/dL (ref 0.44–1.00)
GFR calc Af Amer: >60 mL/min (ref >60)
GFR calc non Af Amer: 54 mL/min — ABNORMAL LOW (ref >60)
Glucose, Bld: 219 mg/dL — ABNORMAL HIGH (ref 70–99)
Potassium: 4.8 mmol/L (ref 3.5–5.1)
Sodium: 139 mmol/L (ref 135–145)

## 2019-10-21 NOTE — ED Triage Notes (Signed)
Patient arrives to ED with complaints of worsening of her SOB today while doing physical therapy at home. Patient states she was SOB when she woke up this morning. Pt on 2L at all times now on 3.5L for O2 >94%. Patient states her O2 dropped to 90% while doing PT today. NAD in triage.

## 2019-10-22 ENCOUNTER — Encounter (HOSPITAL_COMMUNITY): Payer: Self-pay | Admitting: Student

## 2019-10-22 ENCOUNTER — Emergency Department (HOSPITAL_COMMUNITY): Payer: Medicare Other

## 2019-10-22 DIAGNOSIS — E1169 Type 2 diabetes mellitus with other specified complication: Secondary | ICD-10-CM | POA: Diagnosis present

## 2019-10-22 DIAGNOSIS — F329 Major depressive disorder, single episode, unspecified: Secondary | ICD-10-CM | POA: Diagnosis present

## 2019-10-22 DIAGNOSIS — E872 Acidosis: Secondary | ICD-10-CM | POA: Diagnosis present

## 2019-10-22 DIAGNOSIS — I428 Other cardiomyopathies: Secondary | ICD-10-CM | POA: Diagnosis present

## 2019-10-22 DIAGNOSIS — J441 Chronic obstructive pulmonary disease with (acute) exacerbation: Secondary | ICD-10-CM

## 2019-10-22 DIAGNOSIS — I471 Supraventricular tachycardia: Secondary | ICD-10-CM | POA: Diagnosis present

## 2019-10-22 DIAGNOSIS — Z681 Body mass index (BMI) 19 or less, adult: Secondary | ICD-10-CM | POA: Diagnosis not present

## 2019-10-22 DIAGNOSIS — I248 Other forms of acute ischemic heart disease: Secondary | ICD-10-CM | POA: Diagnosis present

## 2019-10-22 DIAGNOSIS — J9621 Acute and chronic respiratory failure with hypoxia: Secondary | ICD-10-CM

## 2019-10-22 DIAGNOSIS — D696 Thrombocytopenia, unspecified: Secondary | ICD-10-CM | POA: Diagnosis present

## 2019-10-22 DIAGNOSIS — J329 Chronic sinusitis, unspecified: Secondary | ICD-10-CM | POA: Diagnosis present

## 2019-10-22 DIAGNOSIS — Z66 Do not resuscitate: Secondary | ICD-10-CM | POA: Diagnosis present

## 2019-10-22 DIAGNOSIS — I452 Bifascicular block: Secondary | ICD-10-CM | POA: Diagnosis present

## 2019-10-22 DIAGNOSIS — G309 Alzheimer's disease, unspecified: Secondary | ICD-10-CM | POA: Diagnosis present

## 2019-10-22 DIAGNOSIS — I480 Paroxysmal atrial fibrillation: Secondary | ICD-10-CM | POA: Diagnosis present

## 2019-10-22 DIAGNOSIS — I2510 Atherosclerotic heart disease of native coronary artery without angina pectoris: Secondary | ICD-10-CM

## 2019-10-22 DIAGNOSIS — F0280 Dementia in other diseases classified elsewhere without behavioral disturbance: Secondary | ICD-10-CM | POA: Diagnosis present

## 2019-10-22 DIAGNOSIS — Z20822 Contact with and (suspected) exposure to covid-19: Secondary | ICD-10-CM | POA: Diagnosis present

## 2019-10-22 DIAGNOSIS — I5043 Acute on chronic combined systolic (congestive) and diastolic (congestive) heart failure: Secondary | ICD-10-CM | POA: Diagnosis present

## 2019-10-22 DIAGNOSIS — R64 Cachexia: Secondary | ICD-10-CM | POA: Diagnosis present

## 2019-10-22 DIAGNOSIS — I110 Hypertensive heart disease with heart failure: Secondary | ICD-10-CM | POA: Diagnosis not present

## 2019-10-22 DIAGNOSIS — E7800 Pure hypercholesterolemia, unspecified: Secondary | ICD-10-CM | POA: Diagnosis present

## 2019-10-22 DIAGNOSIS — E785 Hyperlipidemia, unspecified: Secondary | ICD-10-CM | POA: Diagnosis present

## 2019-10-22 DIAGNOSIS — E1165 Type 2 diabetes mellitus with hyperglycemia: Secondary | ICD-10-CM | POA: Diagnosis present

## 2019-10-22 LAB — MAGNESIUM: Magnesium: 1.6 mg/dL — ABNORMAL LOW (ref 1.7–2.4)

## 2019-10-22 LAB — CBG MONITORING, ED
Glucose-Capillary: 61 mg/dL — ABNORMAL LOW (ref 70–99)
Glucose-Capillary: 145 mg/dL — ABNORMAL HIGH (ref 70–99)

## 2019-10-22 LAB — COMPREHENSIVE METABOLIC PANEL
ALT: 52 U/L — ABNORMAL HIGH (ref 0–44)
AST: 42 U/L — ABNORMAL HIGH (ref 15–41)
Albumin: 4 g/dL (ref 3.5–5.0)
Alkaline Phosphatase: 44 U/L (ref 38–126)
Anion gap: 15 (ref 5–15)
BUN: 30 mg/dL — ABNORMAL HIGH (ref 8–23)
CO2: 22 mmol/L (ref 22–32)
Calcium: 9.1 mg/dL (ref 8.9–10.3)
Chloride: 99 mmol/L (ref 98–111)
Creatinine, Ser: 0.91 mg/dL (ref 0.44–1.00)
GFR calc Af Amer: >60 mL/min (ref >60)
GFR calc non Af Amer: 57 mL/min — ABNORMAL LOW (ref >60)
Glucose, Bld: 344 mg/dL — ABNORMAL HIGH (ref 70–99)
Potassium: 4.6 mmol/L (ref 3.5–5.1)
Sodium: 136 mmol/L (ref 135–145)
Total Bilirubin: 0.4 mg/dL (ref 0.3–1.2)
Total Protein: 7.4 g/dL (ref 6.5–8.1)

## 2019-10-22 LAB — SARS CORONAVIRUS 2 BY RT PCR (HOSPITAL ORDER, PERFORMED IN ~~LOC~~ HOSPITAL LAB): SARS Coronavirus 2: NEGATIVE

## 2019-10-22 LAB — CBC
HCT: 40 % (ref 36.0–46.0)
Hemoglobin: 11.7 g/dL — ABNORMAL LOW (ref 12.0–15.0)
MCH: 30.7 pg (ref 26.0–34.0)
MCHC: 29.3 g/dL — ABNORMAL LOW (ref 30.0–36.0)
MCV: 105 fL — ABNORMAL HIGH (ref 80.0–100.0)
Platelets: 187 10*3/uL (ref 150–400)
RBC: 3.81 MIL/uL — ABNORMAL LOW (ref 3.87–5.11)
RDW: 14.9 % (ref 11.5–15.5)
WBC: 22.4 10*3/uL — ABNORMAL HIGH (ref 4.0–10.5)
nRBC: 0 % (ref 0.0–0.2)

## 2019-10-22 LAB — BRAIN NATRIURETIC PEPTIDE: B Natriuretic Peptide: 2658.9 pg/mL — ABNORMAL HIGH (ref 0.0–100.0)

## 2019-10-22 LAB — TROPONIN I (HIGH SENSITIVITY)
Troponin I (High Sensitivity): 101 ng/L (ref <18)
Troponin I (High Sensitivity): 73 ng/L — ABNORMAL HIGH (ref <18)

## 2019-10-22 LAB — TSH: TSH: 1.517 u[IU]/mL (ref 0.350–4.500)

## 2019-10-22 LAB — DIGOXIN LEVEL: Digoxin Level: 0.5 ng/mL — ABNORMAL LOW (ref 0.8–2.0)

## 2019-10-22 MED ORDER — FUROSEMIDE 10 MG/ML IJ SOLN
20.0000 mg | Freq: Once | INTRAMUSCULAR | Status: AC
  Administered 2019-10-22: 20 mg via INTRAVENOUS
  Filled 2019-10-22: qty 2

## 2019-10-22 MED ORDER — DILTIAZEM HCL 25 MG/5ML IV SOLN
10.0000 mg | Freq: Once | INTRAVENOUS | Status: AC
  Administered 2019-10-22: 10 mg via INTRAVENOUS

## 2019-10-22 MED ORDER — ENOXAPARIN SODIUM 40 MG/0.4ML ~~LOC~~ SOLN: 40.0000 mg | SUBCUTANEOUS | Status: DC

## 2019-10-22 MED ORDER — SODIUM CHLORIDE 0.9% FLUSH: 3.0000 mL | INTRAVENOUS | Status: DC | PRN

## 2019-10-22 MED ORDER — ATORVASTATIN CALCIUM 10 MG PO TABS
10.0000 mg | ORAL_TABLET | Freq: Every evening | ORAL | Status: DC
  Administered 2019-10-22: 10 mg via ORAL
  Filled 2019-10-22: qty 1

## 2019-10-22 MED ORDER — ACETAMINOPHEN 325 MG PO TABS: 650.0000 mg | ORAL_TABLET | ORAL | Status: DC | PRN

## 2019-10-22 MED ORDER — CARVEDILOL 6.25 MG PO TABS
6.2500 mg | ORAL_TABLET | Freq: Two times a day (BID) | ORAL | Status: DC
  Administered 2019-10-22: 6.25 mg via ORAL
  Filled 2019-10-22: qty 1

## 2019-10-22 MED ORDER — MEMANTINE HCL 10 MG PO TABS
10.0000 mg | ORAL_TABLET | Freq: Two times a day (BID) | ORAL | Status: DC
  Administered 2019-10-22: 10 mg via ORAL
  Filled 2019-10-22 (×2): qty 1

## 2019-10-22 MED ORDER — TAMSULOSIN HCL 0.4 MG PO CAPS
0.4000 mg | ORAL_CAPSULE | Freq: Every day | ORAL | Status: DC
  Administered 2019-10-22: 0.4 mg via ORAL
  Filled 2019-10-22: qty 1

## 2019-10-22 MED ORDER — ALBUTEROL SULFATE HFA 108 (90 BASE) MCG/ACT IN AERS
2.0000 | INHALATION_SPRAY | Freq: Once | RESPIRATORY_TRACT | Status: AC
  Administered 2019-10-22: 2 via RESPIRATORY_TRACT
  Filled 2019-10-22: qty 6.7

## 2019-10-22 MED ORDER — ENOXAPARIN SODIUM 30 MG/0.3ML ~~LOC~~ SOLN
30.0000 mg | SUBCUTANEOUS | Status: DC
  Administered 2019-10-22: 30 mg via SUBCUTANEOUS
  Filled 2019-10-22: qty 0.3

## 2019-10-22 MED ORDER — ATROPINE SULFATE 1 MG/10ML IJ SOSY: 0.5000 mg | PREFILLED_SYRINGE | INTRAMUSCULAR | Status: DC | PRN

## 2019-10-22 MED ORDER — FUROSEMIDE 10 MG/ML IJ SOLN
INTRAMUSCULAR | Status: AC
  Administered 2019-10-22: 20 mg via INTRAVENOUS
  Filled 2019-10-22: qty 2

## 2019-10-22 MED ORDER — DILTIAZEM HCL 25 MG/5ML IV SOLN
10.0000 mg | Freq: Once | INTRAVENOUS | Status: AC
  Administered 2019-10-22: 10 mg via INTRAVENOUS
  Filled 2019-10-22: qty 5

## 2019-10-22 MED ORDER — METHYLPREDNISOLONE SODIUM SUCC 40 MG IJ SOLR: 40.0000 mg | Freq: Two times a day (BID) | INTRAMUSCULAR | Status: DC

## 2019-10-22 MED ORDER — METHYLPREDNISOLONE SODIUM SUCC 125 MG IJ SOLR
125.0000 mg | Freq: Once | INTRAMUSCULAR | Status: AC
  Administered 2019-10-22: 125 mg via INTRAVENOUS
  Filled 2019-10-22: qty 2

## 2019-10-22 MED ORDER — ASPIRIN EC 81 MG PO TBEC
81.0000 mg | DELAYED_RELEASE_TABLET | Freq: Every day | ORAL | Status: DC
  Administered 2019-10-22: 81 mg via ORAL
  Filled 2019-10-22: qty 1

## 2019-10-22 MED ORDER — FERROUS SULFATE 325 (65 FE) MG PO TABS: 325.0000 mg | ORAL_TABLET | Freq: Every day | ORAL | Status: DC

## 2019-10-22 MED ORDER — MOMETASONE FURO-FORMOTEROL FUM 200-5 MCG/ACT IN AERO
2.0000 | INHALATION_SPRAY | Freq: Two times a day (BID) | RESPIRATORY_TRACT | Status: DC
  Administered 2019-10-22: 2 via RESPIRATORY_TRACT
  Filled 2019-10-22: qty 8.8

## 2019-10-22 MED ORDER — SODIUM CHLORIDE 0.9 % IV SOLN: 250.0000 mL | INTRAVENOUS | Status: DC | PRN

## 2019-10-22 MED ORDER — BENAZEPRIL HCL 5 MG PO TABS
10.0000 mg | ORAL_TABLET | Freq: Every day | ORAL | Status: DC
  Administered 2019-10-22: 10 mg via ORAL
  Filled 2019-10-22: qty 2

## 2019-10-22 MED ORDER — AEROCHAMBER PLUS FLO-VU LARGE MISC
Status: AC
  Administered 2019-10-22: 1
  Filled 2019-10-22: qty 1

## 2019-10-22 MED ORDER — ONDANSETRON HCL 4 MG/2ML IJ SOLN: 4.0000 mg | Freq: Four times a day (QID) | INTRAMUSCULAR | Status: DC | PRN

## 2019-10-22 MED ORDER — DIGOXIN 125 MCG PO TABS
0.0625 mg | ORAL_TABLET | Freq: Every day | ORAL | Status: DC
  Administered 2019-10-22: 0.0625 mg via ORAL
  Filled 2019-10-22: qty 1

## 2019-10-22 MED ORDER — POTASSIUM CHLORIDE CRYS ER 20 MEQ PO TBCR
20.0000 meq | EXTENDED_RELEASE_TABLET | Freq: Once | ORAL | Status: AC
  Administered 2019-10-22: 20 meq via ORAL
  Filled 2019-10-22: qty 1

## 2019-10-22 MED ORDER — IPRATROPIUM-ALBUTEROL 0.5-2.5 (3) MG/3ML IN SOLN
3.0000 mL | RESPIRATORY_TRACT | Status: DC
  Administered 2019-10-22 (×2): 3 mL via RESPIRATORY_TRACT
  Filled 2019-10-22 (×3): qty 3

## 2019-10-22 MED ORDER — SODIUM CHLORIDE 0.9 % IV BOLUS
500.0000 mL | Freq: Once | INTRAVENOUS | Status: AC
  Administered 2019-10-22: 500 mL via INTRAVENOUS

## 2019-10-22 MED ORDER — AEROCHAMBER PLUS FLO-VU LARGE MISC: 1.0000 | Freq: Once | Status: AC

## 2019-10-22 MED ORDER — FUROSEMIDE 10 MG/ML IJ SOLN
20.0000 mg | Freq: Two times a day (BID) | INTRAMUSCULAR | Status: DC
  Administered 2019-10-22: 20 mg via INTRAVENOUS

## 2019-10-22 MED ORDER — IOHEXOL 350 MG/ML SOLN
100.0000 mL | Freq: Once | INTRAVENOUS | Status: AC | PRN
  Administered 2019-10-22: 80 mL via INTRAVENOUS

## 2019-10-22 MED ORDER — VITAMIN B-12 1000 MCG PO TABS
1000.0000 ug | ORAL_TABLET | Freq: Every day | ORAL | Status: DC
  Administered 2019-10-22: 1000 ug via ORAL
  Filled 2019-10-22: qty 1

## 2019-10-22 MED ORDER — SODIUM CHLORIDE 0.9% FLUSH
3.0000 mL | Freq: Two times a day (BID) | INTRAVENOUS | Status: DC
  Administered 2019-10-22: 3 mL via INTRAVENOUS

## 2019-10-22 MED ORDER — ALBUTEROL SULFATE (2.5 MG/3ML) 0.083% IN NEBU: 2.5000 mg | INHALATION_SOLUTION | RESPIRATORY_TRACT | Status: DC | PRN

## 2019-10-22 MED ORDER — UMECLIDINIUM BROMIDE 62.5 MCG/INH IN AEPB
1.0000 | INHALATION_SPRAY | Freq: Every day | RESPIRATORY_TRACT | Status: DC
  Administered 2019-10-22: 1 via RESPIRATORY_TRACT
  Filled 2019-10-22: qty 7

## 2019-10-22 MED ORDER — ASPIRIN 81 MG PO CHEW
324.0000 mg | CHEWABLE_TABLET | Freq: Once | ORAL | Status: AC
  Administered 2019-10-22: 324 mg via ORAL
  Filled 2019-10-22: qty 4

## 2019-10-22 MED ORDER — DOPAMINE-DEXTROSE 3.2-5 MG/ML-% IV SOLN
0.0000 ug/kg/min | INTRAVENOUS | Status: DC
  Filled 2019-10-22: qty 250

## 2019-10-23 ENCOUNTER — Telehealth: Payer: Self-pay | Admitting: Pulmonary Disease

## 2019-10-23 MED FILL — Medication: Qty: 1 | Status: AC

## 2019-10-23 NOTE — Telephone Encounter (Signed)
Yes please gather a sympathy card so that Dr. Tonia Brooms and I can sign.Elisha Headland, FNP

## 2019-10-23 NOTE — Progress Notes (Signed)
Chaplain responded to [place] to be present when patient's daughter arrived who did not know her mother had died.  Chaplain accompanied RN who told the daughter of her mother's passing.  Ministered to the daughter and a close friend.  When 10 family members were all gathered on a conference call at bedside Chaplain provided a [place] service of scripture, Psalms, commendation and prayer.  Chaplain escorted daughter from hospital.  Vernell Morgans Chaplain Resident

## 2019-10-23 NOTE — Telephone Encounter (Signed)
Patient's daughter called, Ms. Rogstad passed away after admission to hospital on 15-Nov-2019. Sympathy card sent.

## 2019-10-24 NOTE — Consult Note (Signed)
Cardiology Consultation:   Patient ID: AVYONNA WAGONER; [id number]; April 13, 1930   Admit date: 10-Nov-2019 Date of Consult: 10/13/2019  Primary Care Provider: [name], MD Primary Cardiologist: [name], MD Primary Electrophysiologist:  None   Patient Profile:   [name] is a 86 y.o. female with a PMH of chronic combined CHF, atrial fibrillation not on anticoagulation, severe mitral regurgitation not a candidate for MitraClip, HTN, HLD, DM type 2, COPD on home O2, and dementia who is being seen today for the evaluation of CHF at the request of Dr. Aundria Rud.   History of Present Illness:   [name] was in her usual state of health until yesterday morning when she began experiencing increased SOB. Daughter increased her home O2 to 3.5L and gave a breathing treatment with minimal improvement in symptoms. Her oxygen levels dropped despite increase in O2 and she had some increased work of breathing prompting her ED visit.   She was last evaluated by cardiology at an outpatient visit with Georgie Chard, PA-C 10/01/19, at which time she had no complaint sof chest pain, SOB, palpitations, LE edema, orthopnea, or syncope, however had recent dizziness c/f orthostatic hypotension as symptoms mostly occurred with ambulation. Her carvedilol was decreased to 6.25 mg BID and a 1 week zio patch monitor was ordered to assess atrial fibrillation burden, though I do not see the order. She was recommended to follow-up in 6 weeks for close monitoring. Her last echocardiogram 05/2019 showed EF 30-35%, indeterminate LV diastolic function, global hypokinesis, severe biatrial enlargement severe MR, and mild plaque involving the aortic root.  At the time of this evaluation the patient continues to have significant SOB with accessory muscle use. No family at bedside to assist with history taking. She does not recall why she was brought to the hospital. She states she is "hanging in there". She denies any pain  at this time.   ED course:  Intermittently tachycardic, tachypneic, and hypertensive, O2 sats in the 90s on O2 via [place], afebrile. Labs notable for electrolytes wnl, Cr 0.95, WBC 9.7>22.4, Hgb 11.7, PLT 187 , HsTrop 101>73, BNP 2658. CXR was without acute findings. CTA Chest showed no PE, mild pulmonary edema, cardiomegaly, CAD, and aortic atherosclerosis. She was given nebulizers and steroids for possible COPD component, as well as IV lasix 20mg  for possible CHF component of her acute on chronic respiratory failure. Additionally given IV diltiazem 10mg  presumably for rate control of her atrial fibrillation.   Past Medical History:  Diagnosis Date  . Acute bronchitis   . Acute on chronic respiratory failure with hypoxia (HCC)   . Alzheimer disease (HCC) 06/04/2018  . Benign hypertensive renal disease   . CAD (coronary artery disease)   . Cardiomyopathy    non-ischemic 04/2008 EF 45% cardiac MRI no scar  . CHF (congestive heart failure) (HCC)   . Chronic sinusitis   . COPD (chronic obstructive pulmonary disease) (HCC)   . COPD exacerbation (HCC)   . Depression   . Diabetes mellitus without complication, without long-term current use of insulin (HCC)   . Dyslipidemia associated with type 2 diabetes mellitus (HCC)   . Dyspnea on exertion    chronic  . Heart disease   . Hypercholesterolemia   . Hypertension   . On home oxygen therapy    "3L; 24/7; for the last 2 1/2 weeks; none before that" (07/26/2016)  . Protein-calorie malnutrition, severe (HCC)   . Sinus headache    "chronic"  . Stroke [facility]) 1997   "  mild"  . Type II diabetes mellitus (HCC)     Past Surgical History:  Procedure Laterality Date  . carotid angiogram     2006  . ESOPHAGOGASTRODUODENOSCOPY     with foreign body removal  . NASAL SINUS SURGERY  ~1974;~ 1976  . TONSILLECTOMY     "when I was a chld"     Home Medications:  Prior to Admission medications   Medication Sig Start Date End Date Taking? Authorizing Provider   albuterol (PROVENTIL) (2.5 MG/3ML) 0.083% nebulizer solution Take 3 mLs (2.5 mg total) by nebulization every 4 (four) hours as needed for wheezing or shortness of breath. 09/30/19  Yes Icard, Bradley L, DO  albuterol (VENTOLIN HFA) 108 (90 Base) MCG/ACT inhaler Inhale 2 puffs into the lungs every 6 (six) hours as needed for wheezing or shortness of breath. 09/25/19 10/25/19 Yes Coral Ceo, NP  aspirin EC 81 MG tablet Take 81 mg by mouth daily.   Yes [provider]  atorvastatin (LIPITOR) 10 MG tablet TAKE 1 TABLET(10 MG) BY MOUTH DAILY AT 6 PM Patient taking differently: Take 10 mg by mouth every evening.  01/25/19  Yes Wendall Stade, MD  benazepril (LOTENSIN) 10 MG tablet Take 10 mg by mouth daily.    Yes [provider]  budesonide-formoterol (SYMBICORT) 160-4.5 MCG/ACT inhaler INHALE 2 PUFFS INTO THE LUNGS TWICE DAILY Patient taking differently: Inhale 2 puffs into the lungs in the morning and at bedtime. I 09/06/18  Yes Icard, Bradley L, DO  carvedilol (COREG) 6.25 MG tablet Take 1 tablet (6.25 mg total) by mouth 2 (two) times daily with a meal. 10/01/19  Yes Georgie Chard D, NP  digoxin (LANOXIN) 0.125 MG tablet TAKE 1/2 TABLET BY MOUTH EVERY DAY. PLEASE KEEP APPT IN SEPT FOR FUTURE REFILLS Patient taking differently: Take 0.0625 mg by mouth daily.  01/23/19  Yes Wendall Stade, MD  feeding supplement (BOOST HIGH PROTEIN) LIQD Take 1 Container by mouth as needed (between meal).   Yes [provider]  ferrous sulfate 325 (65 FE) MG tablet Take 325 mg by mouth daily with breakfast.   Yes [provider]  fexofenadine (ALLEGRA) 180 MG tablet Take 180 mg by mouth daily as needed for allergies or rhinitis.   Yes [provider]  fluticasone (FLONASE) 50 MCG/ACT nasal spray Place 1 spray into both nostrils daily as needed for allergies. 06/09/16  Yes [provider]  furosemide (LASIX) 20 MG tablet TAKE 1 TABLET BY MOUTH ONCE DAILY Patient taking  differently: Take 20 mg by mouth daily.  01/25/19  Yes Wendall Stade, MD  glimepiride (AMARYL) 2 MG tablet Take 2 mg by mouth daily as needed (for blood sugar over 200.).   Yes [provider]  glucose 4 GM chewable tablet Chew 1 tablet by mouth as needed for low blood sugar.   Yes [provider]  guaiFENesin (MUCINEX) 600 MG 12 hr tablet Take 1 tablet (600 mg total) by mouth 2 (two) times daily. 05/13/19  Yes Regalado, Belkys A, MD  Lactobacillus (PROBIOTIC ACIDOPHILUS PO) Take 1 capsule by mouth daily.   Yes [provider]  memantine (NAMENDA) 10 MG tablet TAKE 1 TABLET(10 MG) BY MOUTH TWICE DAILY Patient taking differently: Take 10 mg by mouth 2 (two) times daily.  10/04/2019  Yes York Spaniel, MD  metFORMIN (GLUCOPHAGE-XR) 500 MG 24 hr tablet Take 1,000 mg by mouth 2 (two) times daily.  03/13/15  Yes [provider]  Multiple  Vitamin (MV-ONE PO) Take 1 tablet by mouth daily.   Yes [provider]  ondansetron (ZOFRAN-ODT) 4 MG disintegrating tablet Take 1 tablet by mouth every 8 (eight) hours as needed for nausea/vomiting. 09/13/19  Yes [provider]  predniSONE (DELTASONE) 20 MG tablet Take 20 mg by mouth once. Daughter administered once dose from an old prescription.   Yes [provider]  Spacer/Aero-Holding Chambers (AEROCHAMBER MV) inhaler Use as instructed Patient taking differently: 1 each by Other route See admin instructions. Use as instructed 02/21/17  Yes Michele Mcalpine, MD  tamsulosin (FLOMAX) 0.4 MG CAPS capsule Take 1 capsule (0.4 mg total) by mouth daily. 05/13/19  Yes Regalado, Belkys A, MD  Tiotropium Bromide Monohydrate (SPIRIVA RESPIMAT) 2.5 MCG/ACT AERS Inhale 2 puffs into the lungs daily. 05/22/19  Yes Icard, Rachel Bo, DO  vitamin B-12 (CYANOCOBALAMIN) 500 MCG tablet Take 1,000 mcg by mouth daily.    Yes [provider]  potassium chloride (KLOR-CON) 10 MEQ tablet Take 10 mEq by mouth daily. Patient  not taking: Reported on 10/26/19    [provider]    Inpatient Medications: Scheduled Meds: . enoxaparin (LOVENOX) injection  30 mg Subcutaneous Q24H  . furosemide  20 mg Intravenous BID  . ipratropium-albuterol  3 mL Nebulization Q4H  . [START ON 10/23/2019] methylPREDNISolone (SOLU-MEDROL) injection  40 mg Intravenous Q12H  . sodium chloride flush  3 mL Intravenous Q12H   Continuous Infusions: . sodium chloride     PRN Meds: sodium chloride, acetaminophen, ondansetron (ZOFRAN) IV, sodium chloride flush  Allergies:   No Known Allergies  Social History:   Social History   Socioeconomic History  . Marital status: Divorced    Spouse name: Not on file  . Number of children: 1  . Years of education: Not on file  . Highest education level: Not on file  Occupational History  . Occupation: retired    Associate Professor: RETIRED  Tobacco Use  . Smoking status: Former Smoker    Packs/day: 0.75    Years: 70.00    Pack years: 52.50    Types: Cigarettes    Start date: 12/13/1948    Quit date: 07/26/2016    Years since quitting: 3.2  . Smokeless tobacco: Never Used  Vaping Use  . Vaping Use: Never used  Substance and Sexual Activity  . Alcohol use: No    Alcohol/week: 0.0 standard drinks  . Drug use: No  . Sexual activity: Never  Other Topics Concern  . Not on file  Social History Narrative   Right handed    Caffeine 1 cup per day    Lives at home with daughter    Social Determinants of Health   Financial Resource Strain:   . Difficulty of Paying Living Expenses:   Food Insecurity:   . Worried About Programme researcher, broadcasting/film/video in the Last Year:   . Barista in the Last Year:   Transportation Needs:   . Freight forwarder (Medical):   Marland Kitchen Lack of Transportation (Non-Medical):   Physical Activity:   . Days of Exercise per Week:   . Minutes of Exercise per Session:   Stress:   . Feeling of Stress :   Social Connections:   . Frequency of Communication with Friends  and Family:   . Frequency of Social Gatherings with Friends and Family:   . Attends Religious Services:   . Active Member of Clubs or Organizations:   . Attends Banker Meetings:   .  Marital Status:   Intimate Partner Violence:   . Fear of Current or Ex-Partner:   . Emotionally Abused:   Marland Kitchen Physically Abused:   . Sexually Abused:     Family History:    Family History  Problem Relation Age of Onset  . Heart disease Father   . Alcohol abuse Sister   . Alcohol abuse Brother   . Coronary artery disease Other        family history     ROS:  Please see the history of present illness.  ROS  All other ROS reviewed and negative.     Physical Exam/Data:   Vitals:   2019/11/10 1132 November 10, 2019 1215 11/10/2019 1315 2019/11/10 1400  BP: (!) 122/98 139/75 (!) 143/62 134/63  Pulse: 95 95 96 97  Resp: Temp:      TempSrc:      SpO2: 92% 93%  92%  Weight:      Height:        Intake/Output Summary (Last 24 hours) at 11/10/19 1434 Last data filed at 11/10/2019 1156 Gross per 24 hour  Intake 50 ml  Output --  Net 50 ml   Filed Weights   10/23/2019 1557  Weight: 38.6 kg   Body mass index is 15.06 kg/m.  General:  Thin chronically ill appearing elderly female laying in bed with increased work of breathing.  HEENT: sclera anicteric  Neck: no JVD Vascular: No carotid bruits; distal pulses 2+ bilaterally Cardiac:  normal S1, S2; RRR; +murmur, no rubs or gallops Lungs: +accessory muscle use with prolonged expiratory phase. Poor airway movement bilateral without obvious wheezes, rhonchi or rales  Abd: NABS, soft, nontender, no hepatomegaly Ext: no edema Musculoskeletal:  No deformities, BUE and BLE strength normal and equal Skin: dry, extremities are cold to touch as she is laying in bed without covers.  Neuro: CNs 2-12 intact, no focal abnormalities noted Psych: Pleasantly demented   EKG:  The EKG was personally reviewed and demonstrates:  Likely multifocal  atrial tachycardia with rate 123 bpm, PVCs, LAFB and RBBB, no STE/D.  Telemetry:  Telemetry was personally reviewed and demonstrates:  Frequent ectopy with intermittent PVCs in pairs/triplets, MAT, and sinus rhythm.   Relevant CV Studies: Echocardiogram 06/20/2019:  1. Left ventricular ejection fraction, by estimation, is 30 to 35%. The  left ventricle has moderately decreased function. The left ventricle  demonstrates global hypokinesis. Indeterminate diastolic filling due to  E-A fusion.  2. Right ventricular systolic function is normal. The right ventricular  size is mildly enlarged. There is normal pulmonary artery systolic  pressure. The estimated right ventricular systolic pressure is 20.1 mmHg.  3. Left atrial size was severely dilated.  4. Right atrial size was severely dilated.  5. Restricted PMVL in systole (IIIB). The mitral valve is grossly normal.  Severe mitral valve regurgitation.  6. The aortic valve is tricuspid. Aortic valve regurgitation is not  visualized. No aortic stenosis is present.  7. There is mild (Grade II) layered plaque involving the aortic root.  8. The inferior vena cava is normal in size with greater than 50%  respiratory variability, suggesting right atrial pressure of 3 mmHg.   Comparison(s): A prior study was performed on 05/09/2019. No significant  change from prior study. EF remains 30-35%. Suspect MR is still severe.  IVC collapsable on this study, indicating volume status improved.    Echocardiogram 05/09/2019:   1. Left ventricular ejection fraction, by estimation, is 30 to 35%. The  left ventricle has moderately decreased function. The left ventricle  demonstrates global hypokinesis. Indeterminate diastolic filling due to  E-A fusion.  2. Right ventricular systolic function is normal. The right ventricular  size is mildly enlarged. There is normal pulmonary artery systolic  pressure. The estimated right ventricular systolic  pressure is 20.1 mmHg.  3. Left atrial size was severely dilated.  4. Right atrial size was severely dilated.  5. Restricted PMVL in systole (IIIB). The mitral valve is grossly normal.  Severe mitral valve regurgitation.  6. The aortic valve is tricuspid. Aortic valve regurgitation is not  visualized. No aortic stenosis is present.  7. There is mild (Grade II) layered plaque involving the aortic root.  8. The inferior vena cava is normal in size with greater than 50%  respiratory variability, suggesting right atrial pressure of 3 mmHg.   Comparison(s): A prior study was performed on 05/09/2019. No significant  change from prior study. EF remains 30-35%. Suspect MR is still severe.  IVC collapsable on this study, indicating volume status improved.   Laboratory Data:  Chemistry Recent Labs  Lab 10/20/2019 1624  NA 139  K 4.8  CL 101  CO2 18*  GLUCOSE 219*  BUN 24*  CREATININE 0.95  CALCIUM 9.6  GFRNONAA 54*  GFRAA >60  ANIONGAP 20*    No results for input(s): PROT, ALBUMIN, AST, ALT, ALKPHOS, BILITOT in the last 168 hours. Hematology Recent Labs  Lab 09/27/2019 1624  WBC 9.7  RBC 3.70*  HGB 11.2*  HCT 37.5  MCV 101.4*  MCH 30.3  MCHC 29.9*  RDW 14.7  PLT 131*   Cardiac EnzymesNo results for input(s): TROPONINI in the last 168 hours. No results for input(s): TROPIPOC in the last 168 hours.  BNP Recent Labs  Lab 11-05-2019 0940  BNP 2,658.9*    DDimer No results for input(s): DDIMER in the last 168 hours.  Radiology/Studies:  DG Chest 2 View  Result Date: 10/08/2019 CLINICAL DATA:  Shortness of breath. EXAM: CHEST - 2 VIEW COMPARISON:  September 27, 2019. FINDINGS: Mild cardiomegaly. Atherosclerosis of thoracic aorta is noted. No pneumothorax or pleural effusion is noted. Both lungs are clear. The visualized skeletal structures are unremarkable. IMPRESSION: No active cardiopulmonary disease. Aortic Atherosclerosis (ICD10-I70.0). Electronically Signed   By: Lupita Raider M.D.   On: 10/05/2019 16:49   CT Angio Chest PE W/Cm &/Or Wo Cm  Result Date: 11-05-19 CLINICAL DATA:  Shortness of breath worsening over several weeks, no chest pain EXAM: CT ANGIOGRAPHY CHEST WITH CONTRAST TECHNIQUE: Multidetector CT imaging of the chest was performed using the standard protocol during bolus administration of intravenous contrast. Multiplanar CT image reconstructions and MIPs were obtained to evaluate the vascular anatomy. CONTRAST:  46mL OMNIPAQUE IOHEXOL 350 MG/ML SOLN COMPARISON:  10/18/2017 FINDINGS: Cardiovascular: Satisfactory opacification of the pulmonary arteries to the segmental level. No evidence of pulmonary embolism. Cardiomegaly. Three-vessel coronary artery calcifications. No pericardial effusion. Aortic atherosclerosis. Mediastinum/Nodes: No enlarged mediastinal, hilar, or axillary lymph nodes. Thyroid gland, trachea, and esophagus demonstrate no significant findings. Lungs/Pleura: Moderate centrilobular emphysema. Mild, diffuse interlobular septal thickening. No pleural effusion or pneumothorax. Upper Abdomen: No acute abnormality. Musculoskeletal: No chest wall abnormality. No acute or significant osseous findings. Review of the MIP images confirms the above findings. IMPRESSION: 1. Negative examination for pulmonary embolism. 2. Emphysema (ICD10-J43.9). 3. Mild, diffuse interlobular septal thickening, suggestive of mild pulmonary edema. 4. Cardiomegaly and coronary artery disease. 5. Aortic Atherosclerosis (ICD10-I70.0). Electronically Signed   By: Trinna Post  Jayme Cloud M.D.   On: 09/27/2019 11:21    Assessment and Plan:   1. Acute on chronic respiratory failure: patient presented with increase SOB and O2 demands at home, with associated productive cough. BNP 2600. WBC 9.7>22.4. CXR was without acute findings. CTA Chest without PE but perhaps mild pulmonary edema and cardiomegaly. She received nebulizers, IV steroids, and IV lasix 20mg  in the ED with no significant  improvement in symptoms. She has accessory muscle use on exam and poor airway movement. Does not appear markedly volume overloaded on exam. Suspect this is more likely COPD exacerbation than acute on chronic combined CHF.  - Agree with additional 1x dose of lasix this evening, then monitor for response.  - Continue home carvedilol, benazepril, and digoxin.  - Will defer COPD management to primary team - Continue to monitor strict I&Os and daily weights - Monitor electrolytes and replete as needed to maintain K >4, Mg >2.  - Consider palliative care consult - patient has home RN visits.   2. Paroxysmal atrial fibrillation: rhythm this admission appears to be intermittent MAT with frequent ectopy, though no clear atrial fibrillation. She was felt to be a poor anticoagulation candidate given age and fall risk.  - Continue carvedilol for rate control - Would avoid CCB use in the future given LV systolic dysfunction - Continue to monitor on telemetry   3. Severe mitral regurgitation: felt to be a poor candidate for MitraClip. Could be contributing to #1 - Continue diuresis as above  4. HTN: BP intermittently elevated, though home medications were just now restarted - Continue home carvedilol and benazepril  5. HLD: LDL 49 on last lipid check in 2018 - Continue atorvastatin  6. Presumed CAD: noted to have severe coronary artery calcifications on CT scan though no formal ischemic evaluation in the past. No complaints of chest pain - Continue aspirin, statin, and BBlocker  7. DM type 2: A1C 6.0 04/2019.  - Continue ISS per primary team  For questions or updates, please contact [facility] Please consult [email or web address] for contact info under Cardiology/STEMI.   Signed, 05/2019, PA-C  10/19/2019 2:34 PM [phone]

## 2019-10-24 NOTE — Progress Notes (Signed)
Civil engineer, contracting [facility]) Community Based Palliative Care  Ms. [name] is our current palliative pt in the community.  Please reach out if we can be of assistance.    Wallis Bamberg RN, BSN, CCRN [facility] Liaison

## 2019-10-24 NOTE — ED Provider Notes (Signed)
[facility] EMERGENCY DEPARTMENT Provider Note   CSN: [id number] Arrival date & time: 11-15-2019  1548     History Chief Complaint  Patient presents with  . Shortness of Breath    [name] is a 86 y.o. female with a history of alzheimer disease, CAD, CHF last EF 30 to 35%, T2DM, chronic respiratory failure on 2L via [place] at baseline, COPD, & afib not anticoagulated who presents to the ED with her daughter for evaluation of dyspnea since yesterday AM. Per patient's daughter the patient was getting dressed yesterday morning & seemed to have some increased dyspnea with wheezing on her 2L via [place], therefore they increased her oxygen to 3.5L & gave her a breathing treatment which seemed to help some. Later in the day her oxygen dropped to 90% on 3.5L, her physical therapist came to the house and she seemed to have some increased work of breathing with her exercises which prompted ED visit.  Patient reports that she has had a mild productive cough of yellow phlegm sputum.  Her daughter has not noted much of a cough.  Her daughter also mentions that she had 4 somewhat loose bowel movements yesterday that were nonbloody.  Denies fever, chills, chest pain, nausea, vomiting, diaphoresis, leg pain/swelling, hemoptysis, recent surgery/trauma, recent long travel, hormone use, personal hx of cancer, or hx of DVT/PE.  Most recent hospitalization was in May of this year in [place] for a COPD exacerbation.  HPI     Past Medical History:  Diagnosis Date  . Acute bronchitis   . Acute on chronic respiratory failure with hypoxia (HCC)   . Alzheimer disease (HCC) 06/04/2018  . Benign hypertensive renal disease   . CAD (coronary artery disease)   . Cardiomyopathy    non-ischemic 04/2008 EF 45% cardiac MRI no scar  . CHF (congestive heart failure) (HCC)   . Chronic sinusitis   . COPD (chronic obstructive pulmonary disease) (HCC)   . COPD exacerbation (HCC)   . Depression   . Diabetes  mellitus without complication, without long-term current use of insulin (HCC)   . Dyslipidemia associated with type 2 diabetes mellitus (HCC)   . Dyspnea on exertion    chronic  . Heart disease   . Hypercholesterolemia   . Hypertension   . On home oxygen therapy    "3L; 24/7; for the last 2 1/2 weeks; none before that" (07/26/2016)  . Protein-calorie malnutrition, severe (HCC)   . Sinus headache    "chronic"  . Stroke [facility]) 1997   "mild"  . Type II diabetes mellitus [facility])     Patient Active Problem List   Diagnosis Date Noted  . Lobar pneumonia, unspecified organism (HCC) 09/25/2019  . Palliative care by specialist   . Delirium   . DNR (do not resuscitate)   . Muscular weakness   . Spiritual or religious counseling   . Atrial fibrillation with RVR (HCC) 05/09/2019  . 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 Transaminitis 05/09/2019  . Alzheimer disease (HCC) 06/04/2018  . COPD with acute exacerbation (HCC) 10/05/2017  . COPD mixed type (HCC) 08/15/2016  . Weakness 08/15/2016  . Protein-calorie malnutrition, severe 07/28/2016  . Pressure injury of skin 07/27/2016  . Chronic obstructive pulmonary disease (HCC) 07/26/2016  . Chronic respiratory failure with hypoxia (HCC) 07/26/2016  . Essential hypertension 07/26/2016  . Dyslipidemia associated with type 2 diabetes mellitus (HCC) 07/26/2016  . Diabetes mellitus without complication, without long-term current use of insulin (HCC)  07/26/2016  . Acute bronchitis 07/26/2016  . Underweight 05/27/2015  . Chronic diastolic heart failure (HCC) 11/24/2011    Past Surgical History:  Procedure Laterality Date  . carotid angiogram     2006  . ESOPHAGOGASTRODUODENOSCOPY     with foreign body removal  . NASAL SINUS SURGERY  ~1974;~ 1976  . TONSILLECTOMY     "when I was a chld"     OB History   No obstetric history on file.     Family History  Problem Relation Age of Onset  . Heart disease Father     . Alcohol abuse Sister   . Alcohol abuse Brother   . Coronary artery disease Other        family history    Social History   Tobacco Use  . Smoking status: Former Smoker    Packs/day: 0.75    Years: 70.00    Pack years: 52.50    Types: Cigarettes    Start date: 12/13/1948    Quit date: 07/26/2016    Years since quitting: 3.2  . Smokeless tobacco: Never Used  Vaping Use  . Vaping Use: Never used  Substance Use Topics  . Alcohol use: No    Alcohol/week: 0.0 standard drinks  . Drug use: No    Home Medications Prior to Admission medications   Medication Sig Start Date End Date Taking? Authorizing Provider  albuterol (PROVENTIL) (2.5 MG/3ML) 0.083% nebulizer solution Take 3 mLs (2.5 mg total) by nebulization every 4 (four) hours as needed for wheezing or shortness of breath. 09/30/19   Icard, Rachel Bo, DO  albuterol (VENTOLIN HFA) 108 (90 Base) MCG/ACT inhaler Inhale 2 puffs into the lungs every 6 (six) hours as needed for wheezing or shortness of breath. 09/25/19 10/25/19  Coral Ceo, NP  aspirin EC 81 MG tablet Take 81 mg by mouth daily.    [provider]  atorvastatin (LIPITOR) 10 MG tablet TAKE 1 TABLET(10 MG) BY MOUTH DAILY AT 6 PM 01/25/19   Wendall Stade, MD  benazepril (LOTENSIN) 10 MG tablet Take 10 mg by mouth daily.     [provider]  budesonide-formoterol (SYMBICORT) 160-4.5 MCG/ACT inhaler INHALE 2 PUFFS INTO THE LUNGS TWICE DAILY 09/06/18   Icard, Elige Radon L, DO  carvedilol (COREG) 6.25 MG tablet Take 1 tablet (6.25 mg total) by mouth 2 (two) times daily with a meal. 10/01/19   Georgie Chard D, NP  digoxin (LANOXIN) 0.125 MG tablet TAKE 1/2 TABLET BY MOUTH EVERY DAY. PLEASE KEEP APPT IN SEPT FOR FUTURE REFILLS 01/23/19   Wendall Stade, MD  feeding supplement (BOOST HIGH PROTEIN) LIQD Take 1 Container by mouth as needed (between meal).    [provider]  ferrous sulfate 325 (65 FE) MG tablet Take 325 mg by mouth daily with breakfast.     [provider]  fexofenadine (ALLEGRA) 180 MG tablet Take 180 mg by mouth daily as needed for allergies or rhinitis.    [provider]  fluticasone (FLONASE) 50 MCG/ACT nasal spray Place 1 spray into both nostrils daily as needed for allergies. 06/09/16   [provider]  furosemide (LASIX) 20 MG tablet TAKE 1 TABLET BY MOUTH ONCE DAILY 01/25/19   Wendall Stade, MD  glimepiride (AMARYL) 2 MG tablet Take 2 mg by mouth daily as needed (for blood sugar over 200.).    [provider]  glucose 4 GM chewable tablet Chew 1 tablet by mouth as needed for low blood sugar.  [provider]  guaifenesin (HUMIBID E) 400 MG TABS tablet Take 400 mg by mouth every 4 (four) hours.    [provider]  guaiFENesin (MUCINEX) 600 MG 12 hr tablet Take 1 tablet (600 mg total) by mouth 2 (two) times daily. 05/13/19   Regalado, Belkys A, MD  Lactobacillus (PROBIOTIC ACIDOPHILUS PO) Take 1 capsule by mouth daily.    [provider]  memantine (NAMENDA) 10 MG tablet TAKE 1 TABLET(10 MG) BY MOUTH TWICE DAILY 11/08/2019   York Spaniel, MD  metFORMIN (GLUCOPHAGE-XR) 500 MG 24 hr tablet Take 1,000 mg by mouth 2 (two) times daily.  03/13/15   [provider]  Multiple Vitamin (MV-ONE PO) Take 1 tablet by mouth daily.    [provider]  ondansetron (ZOFRAN-ODT) 4 MG disintegrating tablet Take 1 tablet by mouth every 8 (eight) hours as needed for nausea/vomiting. 09/13/19   [provider]  potassium chloride (KLOR-CON) 10 MEQ tablet Take 10 mEq by mouth daily.    [provider]  Spacer/Aero-Holding Chambers (AEROCHAMBER MV) inhaler Use as instructed 02/21/17   Michele Mcalpine, MD  tamsulosin (FLOMAX) 0.4 MG CAPS capsule Take 1 capsule (0.4 mg total) by mouth daily. 05/13/19   Regalado, Belkys A, MD  Tiotropium Bromide Monohydrate (SPIRIVA RESPIMAT) 2.5 MCG/ACT AERS Inhale 2 puffs into the lungs daily. 05/22/19   Josephine Igo, DO   vitamin B-12 (CYANOCOBALAMIN) 500 MCG tablet Take 1,000 mcg by mouth daily.     [provider]    Allergies    Patient has no known allergies.  Review of Systems   Review of Systems  Constitutional: Negative for chills, diaphoresis and fever.  Respiratory: Positive for cough and shortness of breath.   Cardiovascular: Negative for chest pain and leg swelling.  Gastrointestinal: Positive for diarrhea. Negative for abdominal pain, blood in stool, nausea and vomiting.  Genitourinary: Negative for dysuria.  Neurological: Negative for dizziness, syncope, weakness, light-headedness and numbness.  All other systems reviewed and are negative.   Physical Exam Updated Vital Signs BP (!) 136/104   Pulse (!) 105   Temp 98.2 F (36.8 C) (Oral)   Resp 16   Ht 5\' 3"  (1.6 m)   Wt 38.6 kg   SpO2 100%   BMI 15.06 kg/m   Physical Exam Vitals and nursing note reviewed.  Constitutional:      General: She is not in acute distress.    Appearance: She is well-developed. She is not toxic-appearing.  HENT:     Head: Normocephalic and atraumatic.     Mouth/Throat:     Comments: Dry MM Eyes:     General:        Right eye: No discharge.        Left eye: No discharge.     Conjunctiva/sclera: Conjunctivae normal.  Cardiovascular:     Rate and Rhythm: Regular rhythm. Tachycardia present.  Pulmonary:     Effort: Pulmonary effort is normal. No respiratory distress.     Breath sounds: Normal breath sounds. No wheezing, rhonchi or rales.     Comments: Poor air movement.  Abdominal:     General: There is no distension.     Palpations: Abdomen is soft.     Tenderness: There is no abdominal tenderness.  Musculoskeletal:     Cervical back: Neck supple.     Right lower leg: No tenderness. No edema.     Left lower leg: No tenderness. No edema.  Skin:  General: Skin is warm and dry.     Findings: No rash.  Neurological:     Mental Status: She is alert.     Comments: Clear speech.     Psychiatric:        Behavior: Behavior normal.     ED Results / Procedures / Treatments   Labs (all labs ordered are listed, but only abnormal results are displayed) Labs Reviewed  BASIC METABOLIC PANEL - Abnormal; Notable for the following components:      Result Value   CO2 18 (*)    Glucose, Bld 219 (*)    BUN 24 (*)    GFR calc non Af Amer 54 (*)    Anion gap 20 (*)    All other components within normal limits  CBC - Abnormal; Notable for the following components:   RBC 3.70 (*)    Hemoglobin 11.2 (*)    MCV 101.4 (*)    MCHC 29.9 (*)    Platelets 131 (*)    All other components within normal limits  CBG MONITORING, ED - Abnormal; Notable for the following components:   Glucose-Capillary 61 (*)    All other components within normal limits    EKG EKG Interpretation  Date/Time:  Monday October 21 2019 15:56:35 EDT Ventricular Rate:  115 PR Interval:    QRS Duration: 144 QT Interval:  356 QTC Calculation: 492 R Axis:   -68 Text Interpretation: Sinus Rhythm Left axis deviation Right bundle branch block Left ventricular hypertrophy with repolarization abnormality ( R in aVL ) Inferior infarct , age undetermined Anterior infarct , age undetermined Abnormal ECG No significant change since 09/26/2019 Confirmed by Geoffery Lyons ([id number]) on 25-Oct-2019 8:12:51 AM   Radiology DG Chest 2 View  Result Date: 10/14/2019 CLINICAL DATA:  Shortness of breath. EXAM: CHEST - 2 VIEW COMPARISON:  September 27, 2019. FINDINGS: Mild cardiomegaly. Atherosclerosis of thoracic aorta is noted. No pneumothorax or pleural effusion is noted. Both lungs are clear. The visualized skeletal structures are unremarkable. IMPRESSION: No active cardiopulmonary disease. Aortic Atherosclerosis (ICD10-I70.0). Electronically Signed   By: Lupita Raider M.D.   On: 10/23/2019 16:49   CT Angio Chest PE W/Cm &/Or Wo Cm  Result Date: 10-25-19 CLINICAL DATA:  Shortness of breath worsening over several weeks, no chest  pain EXAM: CT ANGIOGRAPHY CHEST WITH CONTRAST TECHNIQUE: Multidetector CT imaging of the chest was performed using the standard protocol during bolus administration of intravenous contrast. Multiplanar CT image reconstructions and MIPs were obtained to evaluate the vascular anatomy. CONTRAST:  6mL OMNIPAQUE IOHEXOL 350 MG/ML SOLN COMPARISON:  10/18/2017 FINDINGS: Cardiovascular: Satisfactory opacification of the pulmonary arteries to the segmental level. No evidence of pulmonary embolism. Cardiomegaly. Three-vessel coronary artery calcifications. No pericardial effusion. Aortic atherosclerosis. Mediastinum/Nodes: No enlarged mediastinal, hilar, or axillary lymph nodes. Thyroid gland, trachea, and esophagus demonstrate no significant findings. Lungs/Pleura: Moderate centrilobular emphysema. Mild, diffuse interlobular septal thickening. No pleural effusion or pneumothorax. Upper Abdomen: No acute abnormality. Musculoskeletal: No chest wall abnormality. No acute or significant osseous findings. Review of the MIP images confirms the above findings. IMPRESSION: 1. Negative examination for pulmonary embolism. 2. Emphysema (ICD10-J43.9). 3. Mild, diffuse interlobular septal thickening, suggestive of mild pulmonary edema. 4. Cardiomegaly and coronary artery disease. 5. Aortic Atherosclerosis (ICD10-I70.0). Electronically Signed   By: Lauralyn Primes M.D.   On: 2019/10/25 11:21    Procedures Procedures (including critical care time)  Medications Ordered in ED Medications  sodium chloride flush (NS) 0.9 % injection 3 mL (  has no administration in time range)  sodium chloride flush (NS) 0.9 % injection 3 mL (has no administration in time range)  0.9 %  sodium chloride infusion (has no administration in time range)  acetaminophen (TYLENOL) tablet 650 mg (has no administration in time range)  ondansetron (ZOFRAN) injection 4 mg (has no administration in time range)  enoxaparin (LOVENOX) injection 40 mg (has no  administration in time range)  furosemide (LASIX) injection 20 mg (has no administration in time range)  sodium chloride 0.9 % bolus 500 mL (0 mLs Intravenous Stopped 2019-10-29 1156)  aspirin chewable tablet 324 mg (324 mg Oral Given 2019/10/29 1011)  diltiazem (CARDIZEM) injection 10 mg (10 mg Intravenous Given 10-29-2019 1012)  diltiazem (CARDIZEM) injection 10 mg (10 mg Intravenous Given 29-Oct-2019 1031)  iohexol (OMNIPAQUE) 350 MG/ML injection 100 mL (80 mLs Intravenous Contrast Given Oct 29, 2019 1108)  albuterol (VENTOLIN HFA) 108 (90 Base) MCG/ACT inhaler 2 puff (2 puffs Inhalation Given 10-29-2019 1148)  AeroChamber Plus Flo-Vu Large MISC 1 each (1 each Other Given October 29, 2019 1148)  methylPREDNISolone sodium succinate (SOLU-MEDROL) 125 mg/2 mL injection 125 mg (125 mg Intravenous Given 29-Oct-2019 1151)  furosemide (LASIX) injection 20 mg (20 mg Intravenous Given 2019/10/29 1150)  potassium chloride SA (KLOR-CON) CR tablet 20 mEq (20 mEq Oral Given 2019/10/29 1147)    ED Course  I have reviewed the triage vital signs and the nursing notes.  Pertinent labs & imaging results that were available during my care of the patient were reviewed by me and considered in my medical decision making (see chart for details).    SHUNTE SENSENEY was evaluated in Emergency Department on October 29, 2019 for the symptoms described in the history of present illness. He/she was evaluated in the context of the global COVID-19 pandemic, which necessitated consideration that the patient might be at risk for infection with the SARS-CoV-2 virus that causes COVID-19. Institutional protocols and algorithms that pertain to the evaluation of patients at risk for COVID-19 are in a state of rapid change based on information released by regulatory bodies including the CDC and federal and state organizations. These policies and algorithms were followed during the patient's care in the ED.  MDM Rules/Calculators/A&P                          Patient presents  to the ED with complaints of dyspnea with exertion.  On my assessment patient is nontoxic-appearing, afebrile, tachycardic into the 120s, and with SPO2 of 100% on her baseline 2 L via nasal cannula.  She is eating a [place] sandwich (CBG in the lobby 61).  Lungs with poor air movement but no obvious adventitious sounds.  No signs of peripheral edema.  Ddx: COPD exacerbation, CHF, PE, atypical ACS, anemia, dehydration.  Additional history obtained:  Additional history obtained from patient's daughter at bedside.. Previous records obtained and reviewed.   ED Course:  Lab Tests:  I reviewed and interpreted labs, which included:  CBC: Mild anemia and thrombocytopenia similar to prior ranges.  No leukocytosis. BMP: Mildly acidotic with bicarb of 18 and anion gap of 20, no other significant electrolyte derangement.  Initially hyperglycemic, however repeat CBG in the morning 61.  Imaging Studies ordered:  I ordered imaging studies which included CTA following CXR ordered by triage, I independently visualized and interpreted imaging which showed:  CXR:  No active cardiopulmonary disease. Aortic Atherosclerosis   On initial evaluation of the patient after a 16-hour wait in the lobby  patient has been able to be titrated back down to her 2 L via nasal cannula baseline oxygen requirement-she is saturating well on this without signs of respiratory distress.  She has somewhat poor air movement but no obvious admission to his lung sounds.  Chest x-ray does not show findings of pneumonia or fluid overload.  Her BMP is notable for an anion gap acidosis, this may be dehydration related given her diarrhea yesterday current tachycardia and dry mucous membranes, therefore will give 500 cc of fluid, will avoid aggressive fluid resuscitation in patient with EF of 30 to 35%.  Will add on troponin, BNP, and CT angio for further assessment.  Troponin: Elevated @ 1010  10:03: Patient with increased dyspnea, mild  diaphoresis noted, difficult to discern rhythm A. fib versus sinus tachycardia, repeat EKG does not meet STEMI criteria, troponin is elevated at 101.  Will give 324 mg of aspirin and give 10 mg of IV Cardizem with plan for reassessment. Lungs remain without obvious adventitious sounds. CT angio is pending at this time.    10:20: Per RN IV infiltrated as pushing diltiazem, unsure how much was actually administered, HR unchanged. Will give additional 10 mg of IV Cardizem  11:00: HR improved.   11:25: RE-EVAL: Patient initially symptomatically improved S/p cardizem, however upon return from CT dyspnea mildly returned with wheezing. On re-exam mild expiratory wheezing noted throughout- Albuterol inhaler w/ spacer & steroids ordered.   CTA: Negative for PE. Emphysema noted. Findings suggested of mild pulmonary edema, cardiomegaly & CAD noted. Will stop any further fluids & give Lasix for diuresis. Suspect sxs multifactorial- CHF, COPD, & afib related, requiring 3L, plan for admission for further management & trending of troponins. Will discuss w/ cardiology as patient is followed by Dr. [name]. Patient & her daughter are in agreement.   11:52: CONSULT: Discussed with cardiology team- will see in consultation.   Discussed with hospitalist Dr. Jacqulyn Bath- accepts admission.   This is a shared visit with supervising physician Dr. Judd Lien who has independently evaluated patient & provided guidance in evaluation/management/disposition, in agreement with care   Portions of this note were generated with Dragon dictation software. Dictation errors may occur despite best attempts at proofreading.   Final Clinical Impression(s) / ED Diagnoses Final diagnoses:  Atrial fibrillation with rapid ventricular response (HCC)  COPD exacerbation (HCC)  Acute congestive heart failure, unspecified heart failure type [facility])    Rx / DC Orders ED Discharge Orders    None       Cherly Anderson, PA-C 10/07/2019  1224    Geoffery Lyons, MD 10/09/2019 1432

## 2019-10-24 NOTE — ED Notes (Signed)
Spoke with cardiolgy about condition change, no new orders at this time, will be rounding on pt later

## 2019-10-24 NOTE — ED Notes (Signed)
Warming blanket applied at 2045 per pt rectal temp 93.65F. Paged MD, received new orders. Rectal 94.68F at this time.

## 2019-10-24 NOTE — ED Notes (Signed)
Family call staff to room because pt was cold and clammy and SJOB

## 2019-10-24 NOTE — Telephone Encounter (Signed)
Agree. Thanks for sharing  BLI

## 2019-10-24 NOTE — Progress Notes (Signed)
Pt arrived on the unit from the ED, Alert on a bair hugger. Pt on 6L o2. Pt respiratory status change shortly after arrival on the unit. Pt started agonal breathing, HR 50's; BP 71/57 at this time. Charge RN, RT and RRN called,  MD paged. Pt placed on non rebreather, Dopamine given. Pt expired at 2225.

## 2019-10-24 NOTE — ED Notes (Signed)
Placed pt on bear hugger due to rectal temp

## 2019-10-24 NOTE — ED Notes (Signed)
Brought back into triage for re-eval. Pt is alert. Daughter with pt. Remains on oxygen. States she feels better. Just sleepy.

## 2019-10-24 NOTE — ED Notes (Addendum)
Increased oxygen need noted. 98% on4L previously, now 91% on 6L. Accesory muscle use, HR trending from 80s to 100s with more frequent PVCs also noted. Respiratory reassessed. Wheezing worse despite lasix given at 1200. Dr. Jacqulyn Bath returned page, orders provided to give 1600 duoneb early and 20mg  lasix IV, would also like cardiology paged for this pt to be seen.  Unable to draw ordered labs from IV or straight stick x2, phlebotomy consulted

## 2019-10-24 NOTE — H&P (Signed)
History and Physical    [name] HMC:[id number] DOB: [birth year] DOA: 10/20/2019  PCP: [name], MD  Patient coming from: Home  I have personally briefly reviewed patient's old medical records in [facility]  Chief Complaint: Shortness of breath  HPI: [name] is a 86 y.o. female with medical history significant of chronic hypoxemic respiratory failure-on 2 L of oxygen via nasal cannulae at home secondary to underlying COPD with emphysema, chronic combined systolic and diastolic CHF with ejection fraction of 30 to 35%, A. fib-not on anticoagulation, type 2 diabetes mellitus, dementia, hypertension presents to emergency department due to worsening shortness of breath since yesterday.  Patient tells me that her shortness of breath is associated with wheezing on 2 L of oxygen via nasal cannula, patient's daughter increased her oxygen to 3.5 L and gave her a breathing treatment which helped a little bit.  Later in the day her oxygen dropped to 90% on 3.5 L along with increased work of breathing therefore patient's daughter brought her to ER for further evaluation and management.  Patient denies fever, chills, chest pain, nausea, vomiting, leg swelling, orthopnea, PND, hemoptysis, history of DVT/PE, recent travel, immobilization, productive cough, headache, blurry vision, urinary or bowel changes.  No history of current smoking, alcohol, illicit drug use.  ED Course: Upon arrival to ED: Patient tachycardic, tachypneic, afebrile with no leukocytosis.  Requiring 4 L of oxygen via nasal cannula.  Troponin: 101, BNP: 2658, platelet: 131, CBC shows macrocytic anemia, patient was given 500 cc of IV normal saline with KCl, Cardizem 10 mg x 2, Lasix 20 mg x 1, Solu-Medrol loading dose, aspirin in ED.  CT angio of chest came back negative for PE.  EDP consulted cardiology.  [facility] consulted for admission for acute on chronic respiratory failure with hypoxemia secondary to  acute on chronic CHF/COPD exacerbation.  Review of Systems: As per HPI otherwise negative.    Past Medical History:  Diagnosis Date  . Acute bronchitis   . Acute on chronic respiratory failure with hypoxia (HCC)   . Alzheimer disease (HCC) 06/04/2018  . Benign hypertensive renal disease   . CAD (coronary artery disease)   . Cardiomyopathy    non-ischemic 04/2008 EF 45% cardiac MRI no scar  . CHF (congestive heart failure) (HCC)   . Chronic sinusitis   . COPD (chronic obstructive pulmonary disease) (HCC)   . COPD exacerbation (HCC)   . Depression   . Diabetes mellitus without complication, without long-term current use of insulin (HCC)   . Dyslipidemia associated with type 2 diabetes mellitus (HCC)   . Dyspnea on exertion    chronic  . Heart disease   . Hypercholesterolemia   . Hypertension   . On home oxygen therapy    "3L; 24/7; for the last 2 1/2 weeks; none before that" (07/26/2016)  . Protein-calorie malnutrition, severe (HCC)   . Sinus headache    "chronic"  . Stroke [facility]) 1997   "mild"  . Type II diabetes mellitus (HCC)     Past Surgical History:  Procedure Laterality Date  . carotid angiogram     2006  . ESOPHAGOGASTRODUODENOSCOPY     with foreign body removal  . NASAL SINUS SURGERY  ~1974;~ 1976  . TONSILLECTOMY     "when I was a chld"     reports that she quit smoking about 3 years ago. Her smoking use included cigarettes. She started smoking about 70 years ago. She has a 52.50 pack-year smoking  history. She has never used smokeless tobacco. She reports that she does not drink alcohol and does not use drugs.  No Known Allergies  Family History  Problem Relation Age of Onset  . Heart disease Father   . Alcohol abuse Sister   . Alcohol abuse Brother   . Coronary artery disease Other        family history    Prior to Admission medications   Medication Sig Start Date End Date Taking? Authorizing Provider  albuterol (PROVENTIL) (2.5 MG/3ML) 0.083%  nebulizer solution Take 3 mLs (2.5 mg total) by nebulization every 4 (four) hours as needed for wheezing or shortness of breath. 09/30/19  Yes Icard, Bradley L, DO  albuterol (VENTOLIN HFA) 108 (90 Base) MCG/ACT inhaler Inhale 2 puffs into the lungs every 6 (six) hours as needed for wheezing or shortness of breath. 09/25/19 10/25/19 Yes Coral Ceo, NP  aspirin EC 81 MG tablet Take 81 mg by mouth daily.   Yes [provider]  atorvastatin (LIPITOR) 10 MG tablet TAKE 1 TABLET(10 MG) BY MOUTH DAILY AT 6 PM Patient taking differently: Take 10 mg by mouth every evening.  01/25/19  Yes Wendall Stade, MD  benazepril (LOTENSIN) 10 MG tablet Take 10 mg by mouth daily.    Yes [provider]  budesonide-formoterol (SYMBICORT) 160-4.5 MCG/ACT inhaler INHALE 2 PUFFS INTO THE LUNGS TWICE DAILY Patient taking differently: Inhale 2 puffs into the lungs in the morning and at bedtime. I 09/06/18  Yes Icard, Bradley L, DO  carvedilol (COREG) 6.25 MG tablet Take 1 tablet (6.25 mg total) by mouth 2 (two) times daily with a meal. 10/01/19  Yes Georgie Chard D, NP  digoxin (LANOXIN) 0.125 MG tablet TAKE 1/2 TABLET BY MOUTH EVERY DAY. PLEASE KEEP APPT IN SEPT FOR FUTURE REFILLS Patient taking differently: Take 0.0625 mg by mouth daily.  01/23/19  Yes Wendall Stade, MD  feeding supplement (BOOST HIGH PROTEIN) LIQD Take 1 Container by mouth as needed (between meal).   Yes [provider]  ferrous sulfate 325 (65 FE) MG tablet Take 325 mg by mouth daily with breakfast.   Yes [provider]  fexofenadine (ALLEGRA) 180 MG tablet Take 180 mg by mouth daily as needed for allergies or rhinitis.   Yes [provider]  fluticasone (FLONASE) 50 MCG/ACT nasal spray Place 1 spray into both nostrils daily as needed for allergies. 06/09/16  Yes [provider]  furosemide (LASIX) 20 MG tablet TAKE 1 TABLET BY MOUTH ONCE DAILY Patient taking differently: Take 20 mg by mouth daily.   01/25/19  Yes Wendall Stade, MD  glimepiride (AMARYL) 2 MG tablet Take 2 mg by mouth daily as needed (for blood sugar over 200.).   Yes [provider]  glucose 4 GM chewable tablet Chew 1 tablet by mouth as needed for low blood sugar.   Yes [provider]  guaiFENesin (MUCINEX) 600 MG 12 hr tablet Take 1 tablet (600 mg total) by mouth 2 (two) times daily. 05/13/19  Yes Regalado, Belkys A, MD  Lactobacillus (PROBIOTIC ACIDOPHILUS PO) Take 1 capsule by mouth daily.   Yes [provider]  memantine (NAMENDA) 10 MG tablet TAKE 1 TABLET(10 MG) BY MOUTH TWICE DAILY Patient taking differently: Take 10 mg by mouth 2 (two) times daily.  10/28/19  Yes York Spaniel, MD  metFORMIN (GLUCOPHAGE-XR) 500 MG 24 hr tablet Take 1,000 mg by mouth 2 (two) times daily.  03/13/15  Yes [provider]  Multiple Vitamin (MV-ONE PO) Take 1 tablet by mouth daily.   Yes [provider]  ondansetron (ZOFRAN-ODT) 4 MG disintegrating tablet Take 1 tablet by mouth every 8 (eight) hours as needed for nausea/vomiting. 09/13/19  Yes [provider]  predniSONE (DELTASONE) 20 MG tablet Take 20 mg by mouth once. Daughter administered once dose from an old prescription.   Yes [provider]  Spacer/Aero-Holding Chambers (AEROCHAMBER MV) inhaler Use as instructed Patient taking differently: 1 each by Other route See admin instructions. Use as instructed 02/21/17  Yes Michele Mcalpine, MD  tamsulosin (FLOMAX) 0.4 MG CAPS capsule Take 1 capsule (0.4 mg total) by mouth daily. 05/13/19  Yes Regalado, Belkys A, MD  Tiotropium Bromide Monohydrate (SPIRIVA RESPIMAT) 2.5 MCG/ACT AERS Inhale 2 puffs into the lungs daily. 05/22/19  Yes Icard, Rachel Bo, DO  vitamin B-12 (CYANOCOBALAMIN) 500 MCG tablet Take 1,000 mcg by mouth daily.    Yes [provider]  potassium chloride (KLOR-CON) 10 MEQ tablet Take 10 mEq by mouth daily. Patient not taking: Reported on 09/28/2019     [provider]    Physical Exam: Vitals:   10/20/2019 0830 09/29/2019 0938 10/23/2019 0945 10/08/2019 1000  BP: (!) 155/92 (!) 156/95 (!) 154/78 (!) 173/83  Pulse:  (!) 128 78 (!) 103  Resp: (!) 29 17 (!) 28 (!) 37  Temp:      TempSrc:      SpO2:  100% 99% 99%  Weight:      Height:        Constitutional: Sitting on the bed, has increased work of breathing, on 4 L of oxygen via nasal cannula, very thin and lean.  Appears dehydrated.  Cold and clammy to touch. Eyes: PERRL, lids and conjunctivae normal ENMT: Mucous membranes are dry. Posterior pharynx clear of any exudate or lesions.Normal dentition.  Neck: normal, supple, no masses, no thyromegaly Respiratory: Bilateral diffuse expiratory wheezing noted. Cardiovascular: Tachycardic/ rubs / gallops. No extremity edema. 2+ pedal pulses. No carotid bruits.  Abdomen: no tenderness, no masses palpated. No hepatosplenomegaly. Bowel sounds positive.  Musculoskeletal: no clubbing / cyanosis. No joint deformity upper and lower extremities. Good ROM, no contractures. Normal muscle tone.  Skin: no rashes, lesions, ulcers. No induration Neurologic: CN 2-12 grossly intact. Sensation intact, DTR normal. Strength 5/5 in all 4.  Psychiatric: Normal judgment and insight. Alert and oriented x 3. Normal mood.    Labs on Admission: I have personally reviewed following labs and imaging studies  CBC: Recent Labs  Lab 11/12/19 1624  WBC 9.7  HGB 11.2*  HCT 37.5  MCV 101.4*  PLT 131*   Basic Metabolic Panel: Recent Labs  Lab 12-Nov-2019 1624  NA 139  K 4.8  CL 101  CO2 18*  GLUCOSE 219*  BUN 24*  CREATININE 0.95  CALCIUM 9.6   GFR: Estimated Creatinine Clearance: 25.4 mL/min (by C-G formula based on SCr of 0.95 mg/dL). Liver Function Tests: No results for input(s): AST, ALT, ALKPHOS, BILITOT, PROT, ALBUMIN in the last 168 hours. No results for input(s): LIPASE, AMYLASE in the last 168 hours. No results for input(s): AMMONIA in the  last 168 hours. Coagulation Profile: No results for input(s): INR, PROTIME in the last 168 hours. Cardiac Enzymes: No results for input(s): CKTOTAL, CKMB, CKMBINDEX, TROPONINI in the last 168 hours. BNP (last 3 results) No results for input(s): PROBNP in the last 8760 hours. HbA1C: No results for input(s): HGBA1C in the last 72 hours. CBG: Recent Labs  Lab 09/29/2019 0731 10/02/2019 0958  GLUCAP 61* 145*   Lipid Profile: No results for input(s): CHOL, HDL, LDLCALC, TRIG, CHOLHDL, LDLDIRECT in the last 72 hours. Thyroid Function Tests: No results for input(s): TSH, T4TOTAL, FREET4, T3FREE, THYROIDAB in the last 72 hours. Anemia Panel: No results for input(s): VITAMINB12, FOLATE, FERRITIN, TIBC, IRON, RETICCTPCT in the last 72 hours. Urine analysis:    Component Value Date/Time   COLORURINE YELLOW 09/27/2019 0300   APPEARANCEUR HAZY (A) 09/27/2019 0300   LABSPEC 1.015 09/27/2019 0300   PHURINE 5.0 09/27/2019 0300   GLUCOSEU NEGATIVE 09/27/2019 0300   GLUCOSEU NEGATIVE 05/13/2010 1224   HGBUR NEGATIVE 09/27/2019 0300   BILIRUBINUR NEGATIVE 09/27/2019 0300   KETONESUR NEGATIVE 09/27/2019 0300   PROTEINUR 30 (A) 09/27/2019 0300   UROBILINOGEN 0.2 01/13/2015 1638   NITRITE NEGATIVE 09/27/2019 0300   LEUKOCYTESUR LARGE (A) 09/27/2019 0300    Radiological Exams on Admission: DG Chest 2 View  Result Date: 11-18-19 CLINICAL DATA:  Shortness of breath. EXAM: CHEST - 2 VIEW COMPARISON:  September 27, 2019. FINDINGS: Mild cardiomegaly. Atherosclerosis of thoracic aorta is noted. No pneumothorax or pleural effusion is noted. Both lungs are clear. The visualized skeletal structures are unremarkable. IMPRESSION: No active cardiopulmonary disease. Aortic Atherosclerosis (ICD10-I70.0). Electronically Signed   By: Lupita Raider M.D.   On: 11/18/2019 16:49   CT Angio Chest PE W/Cm &/Or Wo Cm  Result Date: 10/05/2019 CLINICAL DATA:  Shortness of breath worsening over several weeks, no chest pain  EXAM: CT ANGIOGRAPHY CHEST WITH CONTRAST TECHNIQUE: Multidetector CT imaging of the chest was performed using the standard protocol during bolus administration of intravenous contrast. Multiplanar CT image reconstructions and MIPs were obtained to evaluate the vascular anatomy. CONTRAST:  83mL OMNIPAQUE IOHEXOL 350 MG/ML SOLN COMPARISON:  10/18/2017 FINDINGS: Cardiovascular: Satisfactory opacification of the pulmonary arteries to the segmental level. No evidence of pulmonary embolism. Cardiomegaly. Three-vessel coronary artery calcifications. No pericardial effusion. Aortic atherosclerosis. Mediastinum/Nodes: No enlarged mediastinal, hilar, or axillary lymph nodes. Thyroid gland, trachea, and esophagus demonstrate no significant findings. Lungs/Pleura: Moderate centrilobular emphysema. Mild, diffuse interlobular septal thickening. No pleural effusion or pneumothorax. Upper Abdomen: No acute abnormality. Musculoskeletal: No chest wall abnormality. No acute or significant osseous findings. Review of the MIP images confirms the above findings. IMPRESSION: 1. Negative examination for pulmonary embolism. 2. Emphysema (ICD10-J43.9). 3. Mild, diffuse interlobular septal thickening, suggestive of mild pulmonary edema. 4. Cardiomegaly and coronary artery disease. 5. Aortic Atherosclerosis (ICD10-I70.0). Electronically Signed   By: Lauralyn Primes M.D.   On: 10/18/2019 11:21    EKG: Independently reviewed.  A. fib with RVR.  Right bundle branch block.  No ST elevation or depression noted.  Assessment/Plan Principal Problem:   Acute on chronic combined systolic and diastolic CHF (congestive heart failure) (HCC) Active Problems:   Essential hypertension   Type II diabetes mellitus (HCC)   COPD (chronic obstructive pulmonary disease) (HCC)   Atrial fibrillation with RVR (HCC)   Elevated troponin   CAD (coronary artery disease)   Acute on chronic respiratory failure with hypoxia (HCC)   Thrombocytopenia (HCC)    Acute on chronic respiratory failure with hypoxemia (HCC)    Acute on chronic respiratory failure with hypoxemia: Secondary to acute on chronic combined systolic and diastolic CHF with COPD exacerbation: -Patient presented with increased work of breathing, wheezing and hypoxia.  Tachycardic, tachypneic, requiring 4 L of oxygen via nasal cannula.   -Troponin: 101, BNP: 2658, COVID-19: Pending.  Reviewed CT angio of chest-negative for  PE.  Shows mild pulmonary edema -Received Lasix 20 IV once and loading dose of IV Solu-Medrol in ED. -Admit patient to stepdown unit for close monitoring.  On continuous pulse ox.  Try to wean off of oxygen as tolerated. -Start on Lasix 20 IV twice daily.  Solu-Medrol IV twice daily. -Continue home inhalers.  Duo nebs as needed. -Strict INO's and daily weight. -Check electrolytes. -Reviewed echo from 06/20/2019 which showed ejection fraction of 30 to 35% with normal right ventricular systolic function. -Continue aspirin, statin, Coreg, ACE inhibitor, digoxin  -monitor vitals closely. -We will consult palliative care  Elevated troponin: Likely demand ischemia -Patient denies ACS symptoms.  Reviewed EKG. -Trend troponin.  Nitro as needed for chest pain. -EDP consulted cardiology  A. fib with RVR: -Patient received Cardizem 10 mg IV twice in ED. -On telemetry.  Reviewed EKG.  Check electrolytes. -CHA2DS2-VASc score is 7 (Age, sex, HTN, DM, CHF. MI) patient is not on anticoagulation. -Continue Coreg.  Monitor heart rate closely.  Hypertension: -Continue Coreg and benazepril.  Diabetes mellitus: Hold Amaryl and Metformin.  Check A1c.  Started patient on sliding scale insulin.  Dementia: Continue Namenda  Macrocytic anemia: -H&H is stable. -Continue iron and vitamin B12 supplements  Hyperlipidemia: Continue statin  Coronary artery disease: -Patient denies ACS symptoms. -Continue aspirin, statin, Coreg, ACE inhibitor  Thrombocytopenia: Platelet:  131. -No active bleeding.  Monitor platelet  DVT prophylaxis: Lovenox/SCD Code Status: DNR-confirmed with patient and her daughter Family Communication: None present at bedside.  Plan of care discussed with patient in length and he verbalized understanding and agreed with it.  I called patient's daughter and discussed the plan of care and she verbalized understanding.  Disposition Plan: To be determined Consults called: Cardiology by EDP & palliative care Admission status: Inpatient   Ollen Bowl MD [facility]  If 7PM-7AM, please contact night-coverage [email or web address] Password [facility]  10-30-2019, 12:22 PM

## 2019-10-24 NOTE — Significant Event (Signed)
Rapid Response Event Note  Overview:  Code Blue paged at 2202 and then cancelled at 2204. Pt DNR that had just arrived from ED. Per RN, pt became agonal with her breathing and became bradycardic followed by asystole.    Initial Focused Assessment: On arrival, pt not responsive with HR 53, BP 71/57 (64), RR 8. Pt with very weak faint pulse on arrival.   Interventions: Provider notified NRB mask placed on pt Dopamine started  Pt daughter called and instructed to come visit.   Pt rhythm noted to be Asystole followed by PEA around 2215. 2 separate RNS verified no heart sounds at 2222. Provider paged and made aware.   Event Summary:  called at  2204   Event ended at  2252        [facility]

## 2019-10-24 DEATH — deceased

## 2019-11-06 ENCOUNTER — Ambulatory Visit: Payer: Medicare Other | Admitting: Pulmonary Disease

## 2019-11-07 ENCOUNTER — Ambulatory Visit: Payer: Medicare Other | Admitting: Pulmonary Disease

## 2019-11-08 NOTE — Discharge Summary (Signed)
Death Summary  [name] EXH:[id number] DOB: 1929/08/06 DOA: 10/24/19  PCP: [name], MD PCP/Office notified:   Admit date: October 24, 2019 Date of Death: 2019/11/11  Final Diagnoses:  Principal Problem:   Acute on chronic combined systolic and diastolic CHF (congestive heart failure) (HCC) Active Problems:   Essential hypertension   Type II diabetes mellitus (HCC)   COPD (chronic obstructive pulmonary disease) (HCC)   Atrial fibrillation with RVR (HCC)   Elevated troponin   CAD (coronary artery disease)   Acute on chronic respiratory failure with hypoxia (HCC)   Thrombocytopenia (HCC)   Acute on chronic respiratory failure with hypoxemia (HCC)    1. Cardiopulmonary arrest 2. Acute on chronic respiratory failure with hypoxemia secondary to COPD exacerbation 3. Acute on chronic combined systolic and diastolic CHF  History of present illness:  [name] is a 86 y.o. female with medical history significant of chronic hypoxemic respiratory failure-on 2 L of oxygen via nasal cannulae at home secondary to underlying COPD with emphysema, chronic combined systolic and diastolic CHF with ejection fraction of 30 to 35%, A. fib-not on anticoagulation, type 2 diabetes mellitus, dementia, hypertension presents to emergency department due to worsening shortness of breath since yesterday.  Patient tells me that her shortness of breath is associated with wheezing on 2 L of oxygen via nasal cannula, patient's daughter increased her oxygen to 3.5 L and gave her a breathing treatment which helped a little bit.  Later in the day her oxygen dropped to 90% on 3.5 L along with increased work of breathing therefore patient's daughter brought her to ER for further evaluation and management.  Patient denies fever, chills, chest pain, nausea, vomiting, leg swelling, orthopnea, PND, hemoptysis, history of DVT/PE, recent travel, immobilization, productive cough, headache, blurry vision, urinary or  bowel changes.  No history of current smoking, alcohol, illicit drug use.  Hospital Course:   Patient admitted with worsening shortness of breath associated with wheezing secondary to acute COPD exacerbation and acute on chronic combined systolic and diastolic CHF.  She was tachycardic, tachypneic-she received 500 cc of IV normal saline with KCl, Cardizem 10 mg x 2, Lasix 20 mg x 1, Solu-Medrol loading dose and aspirin in ED.  CT angio was also obtained which came back negative for PE.  Patient admitted to stepdown unit.  Patient was DNR.  Upon arrival to stepdown unit: She was requiring 6 L of oxygen.  Her respiratory status changed shortly after arrival on the unit.  She started agonal breathing, her heart rate was 50s, blood pressure: 71/57.  Patient placed on nonrebreather, dopamine given, her rhythm noted to be asystole followed by PEA around 2215.  No heart sounds at 2222. she expired at 2225.  Patient's daughter was notified by RN.   Time: 30 minutes  Signed:  Treyvin Glidden R Shanara Schnieders  [facility] 11-11-19, 12:19 PM

## 2019-11-13 ENCOUNTER — Ambulatory Visit: Payer: Medicare Other | Admitting: Cardiology

## 2019-12-17 ENCOUNTER — Ambulatory Visit: Payer: Medicare Other | Admitting: Neurology

## 2020-09-02 IMAGING — CT CT ANGIO CHEST
2 of 6 series · 19 of 46 positions shown · IV contrast (omnipaque)
Comparison: 10/18/2017

CLINICAL DATA: Shortness of breath worsening over several weeks, no
chest pain

EXAM:
CT ANGIOGRAPHY CHEST WITH CONTRAST
TECHNIQUE: Multidetector CT imaging of the chest was performed using the
standard protocol during bolus administration of intravenous
contrast. Multiplanar CT image reconstructions and MIPs were
obtained to evaluate the vascular anatomy.
CONTRAST:  80mL OMNIPAQUE IOHEXOL 350 MG/ML SOLN

[Series 6: thins · axial · 0.58mm/px · z∈[+1132,+1410]mm · 16 of 306 slices shown]
[im 14/306  lung]
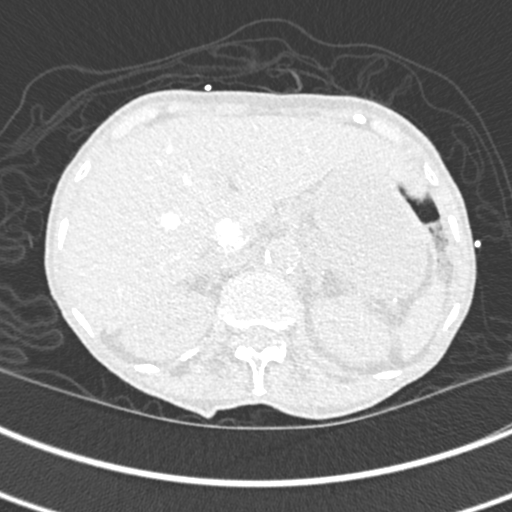
[im 40/306  soft-tissue]
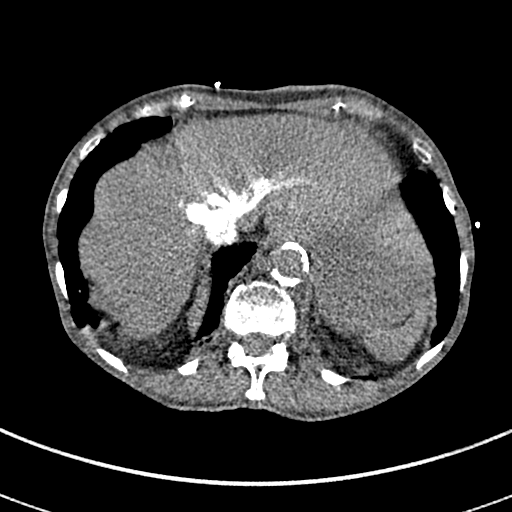
[im 54/306  lung]
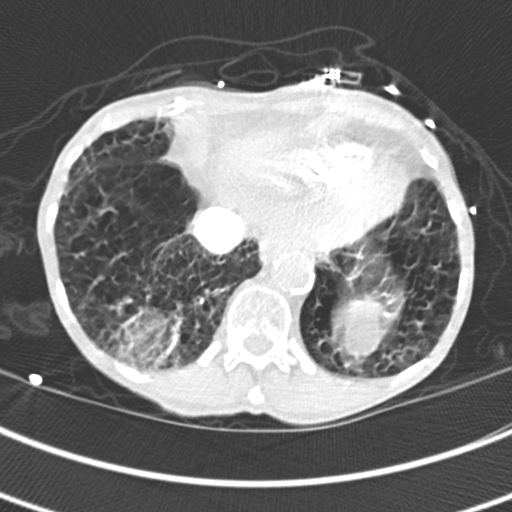
[im 67/306  soft-tissue]
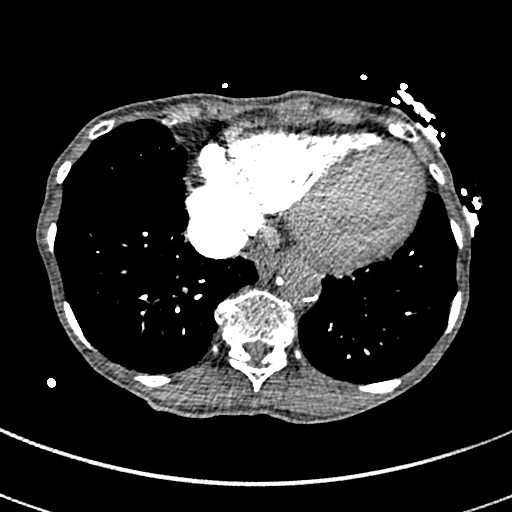
[im 93/306  lung]
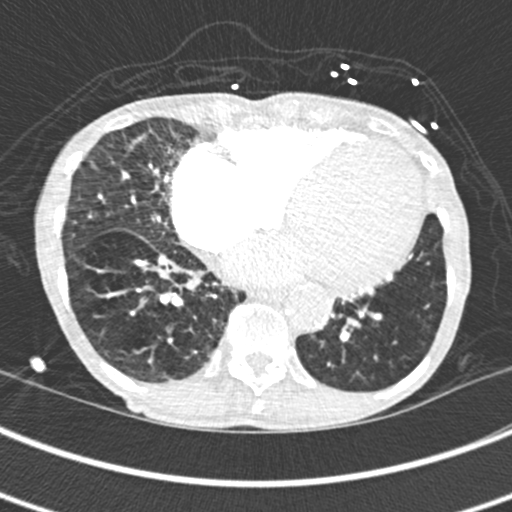
[im 107/306  soft-tissue]
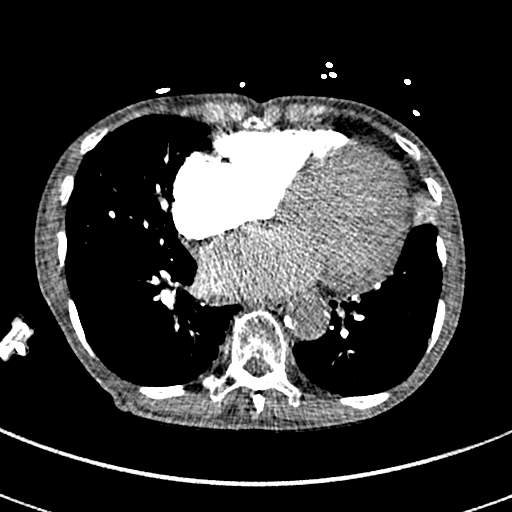
[im 120/306  lung]
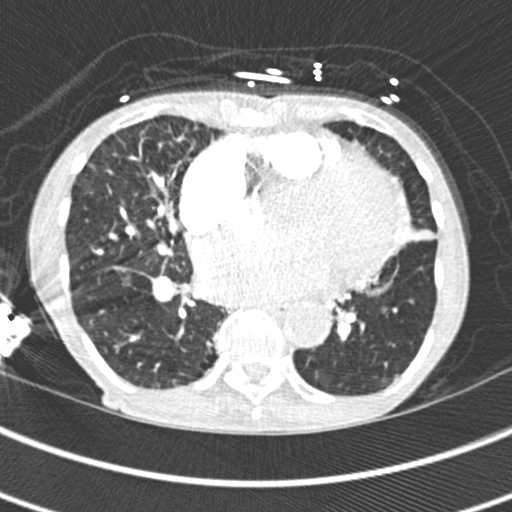
[im 146/306  soft-tissue]
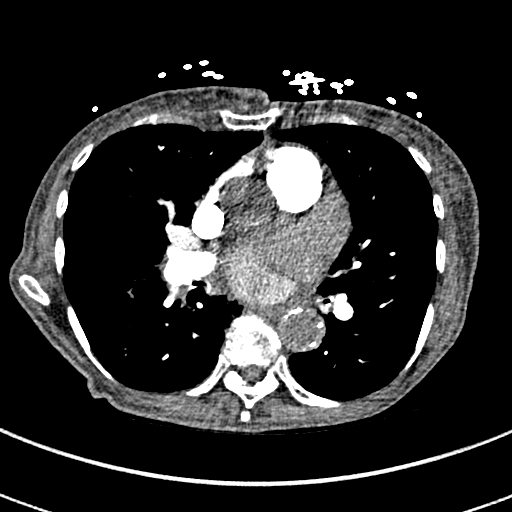
[im 160/306  lung]
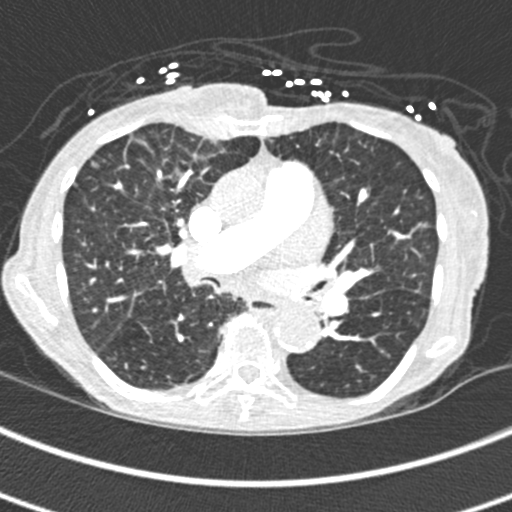
[im 186/306  soft-tissue]
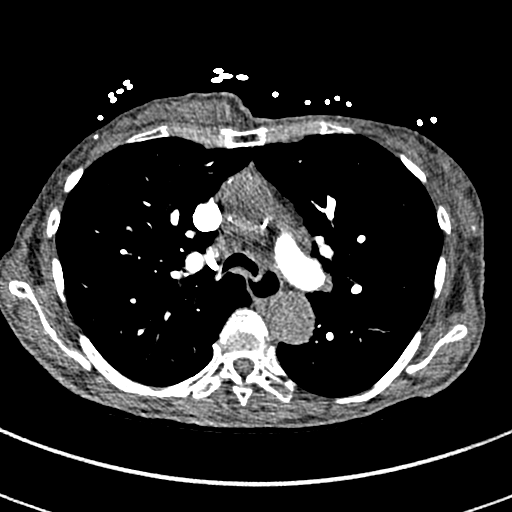
[im 199/306  lung]
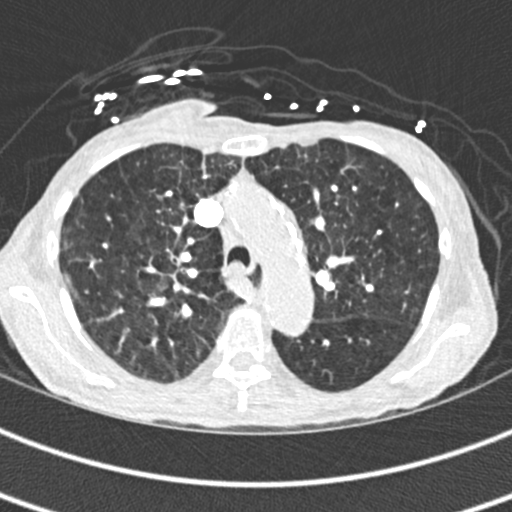
[im 213/306  soft-tissue]
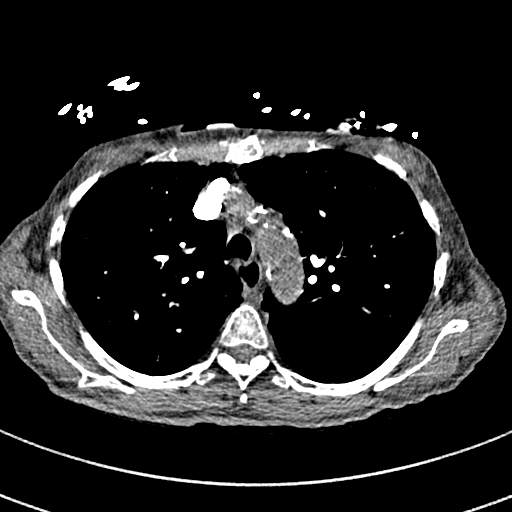
[im 239/306  lung]
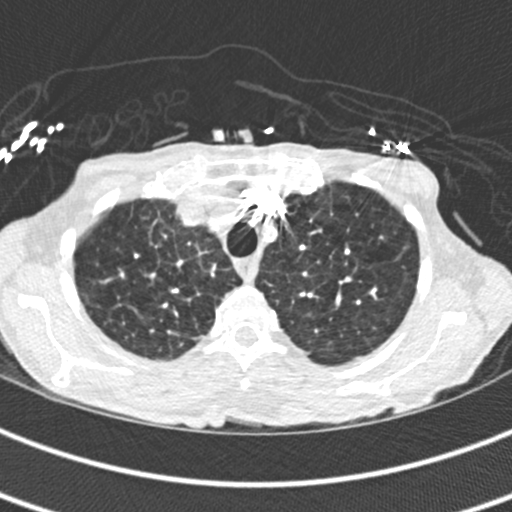
[im 252/306  soft-tissue]
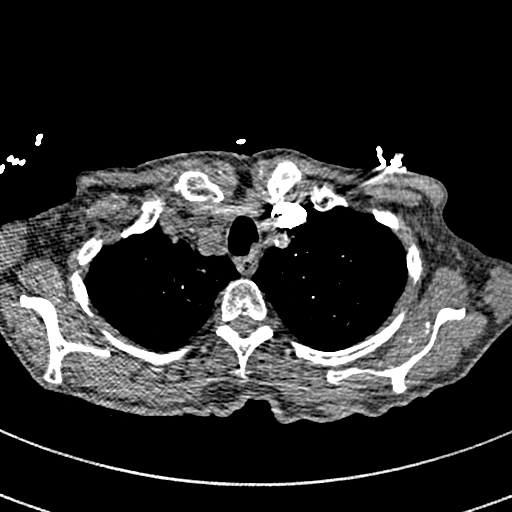
[im 266/306  lung]
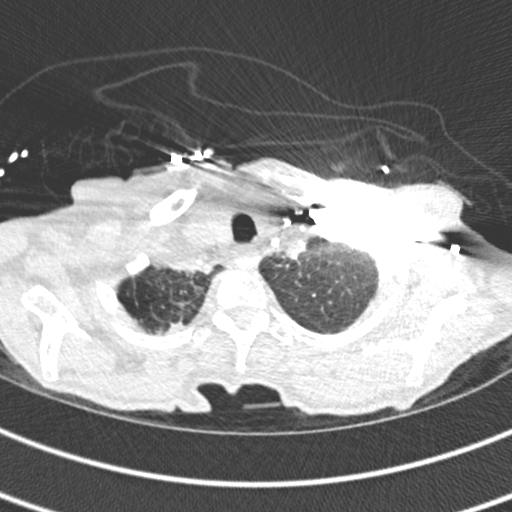
[im 292/306  soft-tissue]
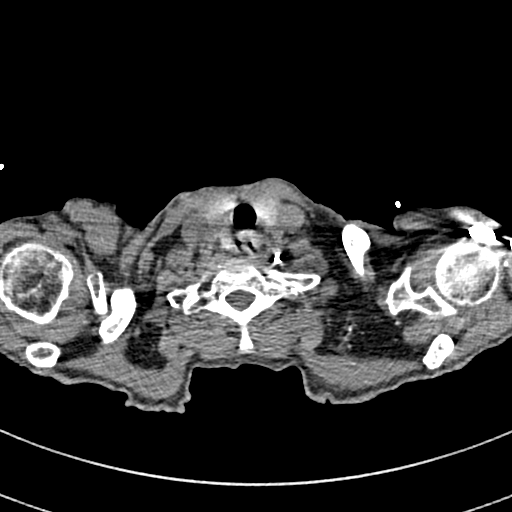

[Series 8: coronal mpr · coronal · 0.60mm/px · 3 of 144 slices shown]
[im 36/144  soft-tissue]
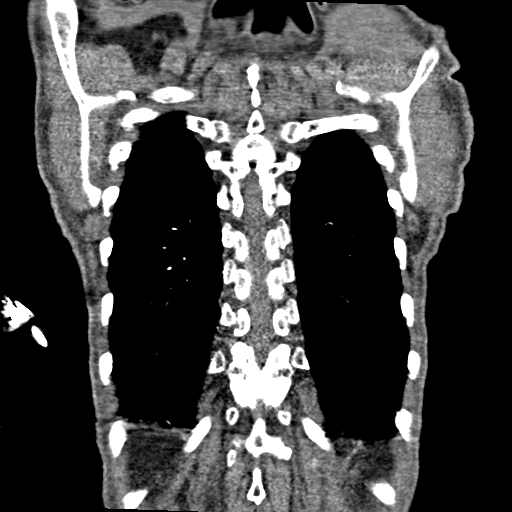
[im 72/144  soft-tissue]
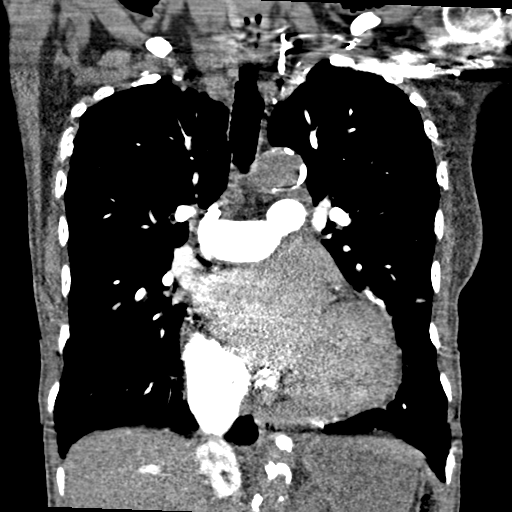
[im 108/144  soft-tissue]
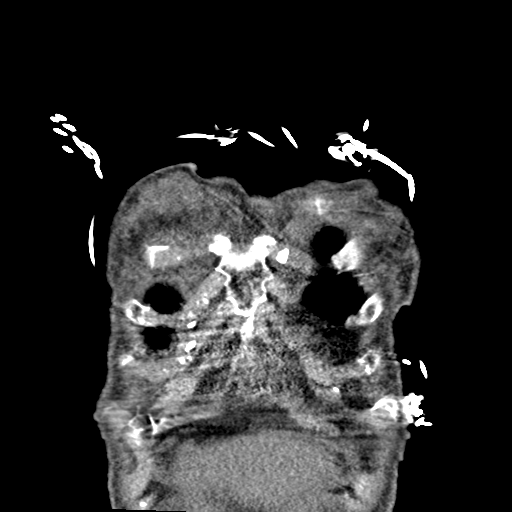

[19 of 46 positions shown; findings below may reference images not displayed]

FINDINGS: Cardiovascular: Satisfactory opacification of the pulmonary arteries
to the segmental level. No evidence of pulmonary embolism.
Cardiomegaly. Three-vessel coronary artery calcifications. No
pericardial effusion. Aortic atherosclerosis.

Mediastinum/Nodes: No enlarged mediastinal, hilar, or axillary lymph
nodes. Thyroid gland, trachea, and esophagus demonstrate no
significant findings.

Lungs/Pleura: Moderate centrilobular emphysema. Mild, diffuse
interlobular septal thickening. No pleural effusion or pneumothorax.

Upper Abdomen: No acute abnormality.

Musculoskeletal: No chest wall abnormality. No acute or significant
osseous findings.

Review of the MIP images confirms the above findings.
IMPRESSION: 1. Negative examination for pulmonary embolism.
2. Emphysema ([id number]).
3. Mild, diffuse interlobular septal thickening, suggestive of mild
pulmonary edema.
4. Cardiomegaly and coronary artery disease.
5. Aortic Atherosclerosis ([id number]).
